# Patient Record
Sex: Female | Born: 1950 | Race: Black or African American | Hispanic: No | Marital: Married | Smoking: Former smoker
Health system: Southern US, Community
[De-identification: ages and names within clinical notes are randomized; demographics above are authoritative.]

## PROBLEM LIST (undated history)

## (undated) DIAGNOSIS — F329 Major depressive disorder, single episode, unspecified: Secondary | ICD-10-CM

## (undated) DIAGNOSIS — T4145XA Adverse effect of unspecified anesthetic, initial encounter: Secondary | ICD-10-CM

## (undated) DIAGNOSIS — Z8739 Personal history of other diseases of the musculoskeletal system and connective tissue: Secondary | ICD-10-CM

## (undated) DIAGNOSIS — I2510 Atherosclerotic heart disease of native coronary artery without angina pectoris: Secondary | ICD-10-CM

## (undated) DIAGNOSIS — R112 Nausea with vomiting, unspecified: Secondary | ICD-10-CM

## (undated) DIAGNOSIS — J45909 Unspecified asthma, uncomplicated: Secondary | ICD-10-CM

## (undated) DIAGNOSIS — T8859XA Other complications of anesthesia, initial encounter: Secondary | ICD-10-CM

## (undated) DIAGNOSIS — Z9889 Other specified postprocedural states: Secondary | ICD-10-CM

## (undated) DIAGNOSIS — F419 Anxiety disorder, unspecified: Secondary | ICD-10-CM

## (undated) DIAGNOSIS — F32A Depression, unspecified: Secondary | ICD-10-CM

## (undated) DIAGNOSIS — E785 Hyperlipidemia, unspecified: Secondary | ICD-10-CM

## (undated) DIAGNOSIS — K5900 Constipation, unspecified: Secondary | ICD-10-CM

## (undated) DIAGNOSIS — D649 Anemia, unspecified: Secondary | ICD-10-CM

## (undated) DIAGNOSIS — C50919 Malignant neoplasm of unspecified site of unspecified female breast: Secondary | ICD-10-CM

## (undated) DIAGNOSIS — K219 Gastro-esophageal reflux disease without esophagitis: Secondary | ICD-10-CM

## (undated) DIAGNOSIS — I10 Essential (primary) hypertension: Secondary | ICD-10-CM

## (undated) DIAGNOSIS — M1990 Unspecified osteoarthritis, unspecified site: Secondary | ICD-10-CM

## (undated) DIAGNOSIS — R413 Other amnesia: Secondary | ICD-10-CM

## (undated) DIAGNOSIS — I639 Cerebral infarction, unspecified: Secondary | ICD-10-CM

## (undated) HISTORY — DX: Hyperlipidemia, unspecified: E78.5

## (undated) HISTORY — DX: Malignant neoplasm of unspecified site of unspecified female breast: C50.919

## (undated) HISTORY — DX: Major depressive disorder, single episode, unspecified: F32.9

## (undated) HISTORY — PX: VAGINAL HYSTERECTOMY: SUR661

## (undated) HISTORY — DX: Personal history of other diseases of the musculoskeletal system and connective tissue: Z87.39

## (undated) HISTORY — PX: LUMBAR SPINE SURGERY: SHX701

## (undated) HISTORY — PX: MASTECTOMY: SHX3

## (undated) HISTORY — DX: Unspecified osteoarthritis, unspecified site: M19.90

## (undated) HISTORY — DX: Essential (primary) hypertension: I10

## (undated) HISTORY — PX: TUBAL LIGATION: SHX77

## (undated) HISTORY — DX: Other amnesia: R41.3

## (undated) HISTORY — DX: Anxiety disorder, unspecified: F41.9

## (undated) HISTORY — PX: CHOLECYSTECTOMY: SHX55

## (undated) HISTORY — PX: REDUCTION MAMMAPLASTY: SUR839

## (undated) HISTORY — PX: DILATION AND CURETTAGE OF UTERUS: SHX78

## (undated) HISTORY — DX: Depression, unspecified: F32.A

## (undated) HISTORY — DX: Atherosclerotic heart disease of native coronary artery without angina pectoris: I25.10

## (undated) HISTORY — PX: BREAST SURGERY: SHX581

---

## 2004-01-29 ENCOUNTER — Ambulatory Visit (HOSPITAL_COMMUNITY): Admission: RE | Admit: 2004-01-29 | Discharge: 2004-01-29 | Payer: Self-pay | Admitting: Internal Medicine

## 2004-01-29 HISTORY — PX: COLONOSCOPY: SHX174

## 2004-07-22 ENCOUNTER — Ambulatory Visit: Payer: Self-pay | Admitting: Family Medicine

## 2005-03-30 ENCOUNTER — Ambulatory Visit: Payer: Self-pay | Admitting: Family Medicine

## 2005-07-12 ENCOUNTER — Ambulatory Visit: Payer: Self-pay | Admitting: Family Medicine

## 2005-08-11 ENCOUNTER — Encounter (INDEPENDENT_AMBULATORY_CARE_PROVIDER_SITE_OTHER): Payer: Self-pay | Admitting: *Deleted

## 2005-08-11 ENCOUNTER — Encounter: Admission: RE | Admit: 2005-08-11 | Discharge: 2005-08-11 | Payer: Self-pay | Admitting: General Surgery

## 2005-08-11 ENCOUNTER — Ambulatory Visit (HOSPITAL_COMMUNITY): Admission: RE | Admit: 2005-08-11 | Discharge: 2005-08-11 | Payer: Self-pay | Admitting: General Surgery

## 2005-08-11 ENCOUNTER — Ambulatory Visit (HOSPITAL_BASED_OUTPATIENT_CLINIC_OR_DEPARTMENT_OTHER): Admission: RE | Admit: 2005-08-11 | Discharge: 2005-08-11 | Payer: Self-pay | Admitting: General Surgery

## 2006-07-11 ENCOUNTER — Ambulatory Visit: Payer: Self-pay | Admitting: Family Medicine

## 2006-07-14 ENCOUNTER — Ambulatory Visit: Payer: Self-pay | Admitting: Cardiology

## 2006-07-26 ENCOUNTER — Ambulatory Visit: Payer: Self-pay

## 2006-08-03 ENCOUNTER — Ambulatory Visit: Payer: Self-pay | Admitting: Cardiology

## 2006-09-01 ENCOUNTER — Other Ambulatory Visit: Admission: RE | Admit: 2006-09-01 | Discharge: 2006-09-01 | Payer: Self-pay | Admitting: Obstetrics & Gynecology

## 2006-09-12 ENCOUNTER — Encounter: Admission: RE | Admit: 2006-09-12 | Discharge: 2006-09-12 | Payer: Self-pay | Admitting: Obstetrics & Gynecology

## 2006-09-22 ENCOUNTER — Encounter: Admission: RE | Admit: 2006-09-22 | Discharge: 2006-09-22 | Payer: Self-pay | Admitting: Obstetrics & Gynecology

## 2006-12-19 ENCOUNTER — Ambulatory Visit: Payer: Self-pay | Admitting: Family Medicine

## 2007-11-14 ENCOUNTER — Encounter: Admission: RE | Admit: 2007-11-14 | Discharge: 2007-11-14 | Payer: Self-pay | Admitting: Family Medicine

## 2008-12-05 ENCOUNTER — Encounter: Admission: RE | Admit: 2008-12-05 | Discharge: 2008-12-05 | Payer: Self-pay | Admitting: *Deleted

## 2009-06-26 ENCOUNTER — Encounter: Admission: RE | Admit: 2009-06-26 | Discharge: 2009-06-26 | Payer: Self-pay | Admitting: Neurosurgery

## 2009-09-23 ENCOUNTER — Encounter: Admission: RE | Admit: 2009-09-23 | Discharge: 2009-12-22 | Payer: Self-pay | Admitting: *Deleted

## 2009-12-11 ENCOUNTER — Encounter: Admission: RE | Admit: 2009-12-11 | Discharge: 2009-12-11 | Payer: Self-pay | Admitting: Neurosurgery

## 2009-12-23 ENCOUNTER — Encounter: Admission: RE | Admit: 2009-12-23 | Discharge: 2009-12-23 | Payer: Self-pay | Admitting: *Deleted

## 2010-12-17 ENCOUNTER — Other Ambulatory Visit: Payer: Self-pay | Admitting: *Deleted

## 2010-12-17 DIAGNOSIS — Z1231 Encounter for screening mammogram for malignant neoplasm of breast: Secondary | ICD-10-CM

## 2011-01-07 ENCOUNTER — Ambulatory Visit: Payer: Self-pay

## 2011-01-07 NOTE — Assessment & Plan Note (Signed)
[facility]                              CARDIOLOGY OFFICE NOTE   Morgan, Santos                        MRN:          [id number]  DATE:07/14/2006                            DOB:          May 20, 1951    REASON FOR VISIT:  Abnormal electrocardiogram and palpitations.   HISTORY OF PRESENT ILLNESS:  [name] is a pleasant 60 year old woman with  a history of hypertension and recently documented hyperlipidemia with an LDL  cholesterol of 169.  She denies any active tobacco use and has no known  history of coronary artery disease or myocardial infarction.  She recently  had a routine physical and had an electrocardiogram obtained which was  significantly abnormal.  This study showed a left anterior fascicular block  with QRS widening of 144 msec, inferior and anterolateral T wave inversions,  and otherwise nonspecific ST-T wave changes.  There were no older tracings  available for comparison.  Symptomatically, [name] denies having any  exertional chest pain or specific dyspnea on exertion.  She does experience  coughing when she is supine and a feeling of discomfort in her chest,  somewhat vague in description.  She sometimes feels some palpitations when  lying down and generally a funny feeling.  She tells me that she has been  experiencing this intermittently for the last 3 years.   ALLERGIES:  THE PATIENT REPORTS A SHELLFISH ALLERGY.   PRESENT MEDICATIONS:  1. Aspirin 325 mg p.o. every day.  2. Lexapro 10 mg p.o. every day.   PAST MEDICAL HISTORY:  1. As outlined in the history of present illness.  2. She has a prior history of breast nodule removal, apparently benign.  3. She has had a cholecystectomy and a partial hysterectomy.   FAMILY HISTORY:  Significant for hyperlipidemia.  She states that her mother  has heart disease and had a heart attack in her 43s.  She has a brother  and sister with reported heart disease in their 56s and  60s.   REVIEW OF SYSTEMS:  As described in the history of present illness.  She has  had no syncope.  She does have mild lower extremity edema at times.  She  denies any claudication.  She has had some problems with anxiety and her  nerves but this is better on Lexapro.  She experiences arthritic  discomfort.  She also describes some occasional wheezing but has had no  problems with asthma that has been documented and uses no inhalers.   SOCIAL HISTORY:  The patient is married.  She works in Designer, fashion/clothing at [employer].  She has a prior tobacco history but quit in 1996.  Denies any alcohol use.   PHYSICAL EXAMINATION:  VITAL SIGNS:  Blood pressure is 141/93, heart rate is  63, weight is 223 pounds.  GENERAL:  This is an overweight woman in no acute distress.  HEENT:  Conjunctivae and lids are normal.  Pharynx is clear.  NECK:  Supple without elevated jugular venous pressure or loud bruits.  No  thyromegaly is noted.  LUNGS:  Generally clear,  somewhat diminished, breath sounds.  No active  wheezing.  CARDIAC:  Reveals a regular rate and rhythm with a split S2, somewhat  paradoxic.  A soft systolic murmur at the base.  No S3, gallop, or  pericardial rub.  ABDOMEN:  Soft.  No hepatomegaly.  Bowel sounds are present.  EXTREMITIES:  Exhibit no significant pitting edema.  Distal pulses are 2+.  SKIN:  No ulcer change are noted.  MUSCULOSKELETAL:  No kyphosis noted.  NEUROPSYCHIATRIC:  The patient is alert and oriented x3.  Affect is normal.   IMPRESSION/RECOMMENDATIONS:  1. Abnormal electrocardiogram as detailed above in a 60 year old woman      with hypertension presently not on medical therapy, recently diagnosed      hyperlipidemia with elevated LDL cholesterol of 169 and potentially      some family history of cardiovascular disease.  She is not reporting      any exertional dyspnea or chest pain but does have some cough and      funny feeling in her chest when supine, as well as palpitations.   We      discussed this today and we will plan an echocardiogram to assess for      structural cardiac abnormalities and exclude the possibility of      cardiomyopathy as well as proceed with an ischemic evaluation via an      adenosine Myoview.  I will then have her follow up in the office to      review the results and next step.  2. Further plans to follow.     Jonelle Sidle, MD  Electronically Signed    SGM/MedQ  DD: 07/14/2006  DT: 07/14/2006  Job #: [id number]   cc:   Delaney Meigs, M.D.

## 2011-01-07 NOTE — Op Note (Signed)
NAME:  Morgan Santos, Morgan Santos                           ACCOUNT NO.:  [id number]   MEDICAL RECORD NO.:  [id number]                   PATIENT TYPE:  AMB   LOCATION:  DAY                                  FACILITY:  [facility]   PHYSICIAN:  Lionel December, M.D.                 DATE OF BIRTH:  01-24-1951   DATE OF PROCEDURE:  01/29/2004  DATE OF DISCHARGE:                                 OPERATIVE REPORT   PROCEDURE:  Total colonoscopy with polypectomy.   INDICATIONS:  Morgan Santos is a 60 year old African American female who recently  had an episode of rectal discomfort followed by bleeding.  She had small to  moderate amount of blood plus some clots.  She is now doing fine.  She also  has history of constipation, has been on a high-fiber diet and Colace.  She  is undergoing diagnostic colonoscopy.  Family history is negative for  colorectal carcinoma.  Procedure and risks were reviewed with the patient  and informed consent was obtained.   PREOPERATIVE MEDICATIONS:  Demerol 50 mg IV, Versed 7 mg IV in divided  doses.   FINDINGS:  The procedure was performed in the endoscopy suite.  The  patient's vital signs and O2 saturation were monitored during the procedure  and remained stable.  The patient was placed in the left lateral recumbent  position.  Rectal examination was performed.  No abnormality noted on  external or digital exam.  Olympus video scope was placed in the rectum and  advanced under vision into the sigmoid colon and beyond.  Preparation was  satisfactory.  Some redundant colon.  The scope was passed to the cecum  which was identified by the ileocecal valve and appendiceal orifice.  Pictures taken for the record.  As the scope was withdrawn, colonic mucosa  was carefully examined.  There was a 7-8 mm polyp at the hepatic flexure  which was snared free for histologic examination, unfortunately broken into  pieces.  Polypectomy, however, was complete.  There was another small 3 mm  polyp at the  proximal sigmoid colon which was cold snared, but this was  lost.  The mucosa of the rest of the sigmoid colon and rectum was normal.  Scope was retroflexed.  We examined the anorectal junction.  Small  hemorrhoids were noted below the dentate line.  Endoscope was straightened  and withdrawn.  The patient tolerated the procedure well.   FINAL DIAGNOSIS:  1. A 7-8 mm polyp snared from the hepatic flexure.  Another 3 mm polyp was     cold snared from the sigmoid colon.  2. External hemorrhoids, possible source of recent rectal bleeding.   RECOMMENDATIONS:  Standard instructions given.  She will resume her high-  fiber diet, Fiber-Choice and Colace as before.  I will be contacting the  patient with biopsy results and further recommendations.      ___________________________________________  Lionel December, M.D.   NR/MEDQ  D:  01/29/2004  T:  01/30/2004  Job:  [id number]   cc:   Colon Flattery, D.O.  [address]  [place]  [place] [address]  Fax: [phone]

## 2011-01-07 NOTE — Consult Note (Signed)
NAME:  Morgan Santos, Morgan Santos NO.:  [id number]   MEDICAL RECORD NO.:  [id number]                  PATIENT TYPE:   LOCATION:                                       FACILITY:   PHYSICIAN:  Morgan Santos, M.D.                 DATE OF BIRTH:  12-01-1950   DATE OF CONSULTATION:  DATE OF DISCHARGE:                                   CONSULTATION   REFERRING PHYSICIAN:  Colon Santos, D.O.   REASON FOR CONSULTATION:  Rectal bleeding.   HISTORY OF PRESENT ILLNESS:  Morgan Santos is a 60 year old African-American  female who presents to our office.  She states several weeks ago she noticed  a burning sensation to her rectum.  She then went to the restroom and  noticed she had a small to moderate amount of rectal bleeding along with  some small clots.  She said this shortly resolved;  however, a few weeks  later she was mowing and she noticed this sensation again followed by rectal  bleeding.  Bleeding has never been associated with defecation.  She is also  complaining of rectal pruritis.  She denies any known history of  hemorrhoids.  She does report hard stools and constipation, and she does  not take her Maalox daily.  Currently, she has bowel movements on a daily  basis which are soft and brown.  She denies any history of diarrhea.  She  denies any melena.  She does have occasional reflux;  however, she denies  any chest pain, heartburn, indigestion, dysphagia, or odynophagia.   CURRENT MEDICATIONS:  1. Lexapro 5 mg daily.  2. Over-the-counter acid tabs p.r.n.  3. Maalox daily.   ALLERGIES:  CODEINE.   PAST MEDICAL HISTORY:  Depression.   PAST SURGICAL HISTORY:  1. Partial hysterectomy in 2003, by Morgan Santos, secondary to uterine     fibroids.  2. Cholecystectomy in 1983, secondary to cholelithiasis.   FAMILY HISTORY:  Positive for maternal aunt with Morgan cancer diagnosed in  her early 99s.  Both mother and father are deceased at age 92 and in his  74's,  respectively.  Family history is significant for CVA, oral carcinoma,  diabetes mellitus, and renal failure.  One sister deceased secondary to  renal failure.  She has four sisters alive and one brother alive.   SOCIAL HISTORY:  Morgan Santos has been married for nine years.  She has four  grown healthy children.  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  She denies  any tobacco, alcohol, or current drug use.  She does report a remote history  of experiencing with marijuana several years ago.   REVIEW OF SYSTEMS:  CONSTITUTIONAL:  Weight is steady, appetite is good.  Denies any fever or chills.  She is complaining of some early morning  fatigue;  however, this  subsides throughout the day.  CARDIOVASCULAR:  Denies any chest pain, palpitations.  PULMONARY:  Denies any shortness of  breath, cough, dyspnea, or hemoptysis.  HEMATOLOGIC:  Denies any history of  anemia except for with menorrhagia prior to hysterectomy.  Denies any  history of sickle cell disease or any other blood dyscrasias.  GASTROINTESTINAL:  See HPI.   PHYSICAL EXAMINATION:  VITAL SIGNS:  Weight 213.5 pounds, height 70.5  inches, temperature 97.9, blood pressure 120/80, pulse 66.  GENERAL:  Morgan Santos is a 60 year old obese African-American female who is  alert, pleasant, oriented, and cooperative.  In no acute distress.  HEENT:  Sclerae are clear, not icteric.  Conjunctivae pink.  Oropharynx pink  and moist without lesions.  NECK:  Supple without masses or thyromegaly.  CHEST:  Regular rate and rhythm with normal S1 and S2, no murmurs, rubs, or  gallops.  LUNGS:  Clear to auscultation bilaterally.  ABDOMEN:  She does have well-healed right upper quadrant scar secondary to  previous cholecystectomy, as well as small midline scar just below the  umbilicus well-healed.  No bruits auscultated.  Positive bowel sounds x4.  Abdomen is soft, nontender,  nondistended, with no palpable mass,  hepatosplenomegaly, no rebound tenderness or guarding.  RECTAL:  Reveals large protruding external hemorrhoids around the  circumference of the anal canal.  They are slightly erythematous, however,  not actively bleeding and very tender to palpation.  Good sphincter tone, a  small amount of light brown Hemoccult negative stool was obtained from the  vault.  No internal masses palpated.  EXTREMITIES:  Good pedal pulses bilaterally, no edema.   ASSESSMENT:  Morgan Santos is a 60 year old African-American female with  intermittent rectal bleeding, proctalgia, and rectal pruritis, consistent  with findings of external hemorrhoids.  She may also have developed a  fissure as well.  Given her history of hard stools, constipation, and  strenuous work, may be attributing to this.  Given her age and her positive  family history of colorectal carcinoma, I would recommend further evaluation  at this time to rule out polyps or colorectal carcinoma.   RECOMMENDATIONS:  1. Rx was given for Anusol suppositories one per rectum t.i.d. x14 days with     no refills.  2. Hemorrhoid literature on standard treatment was reviewed, including sitz     baths, increasing dietary fiber.  3. Recommend over-the-counter stool softener daily.  4. Recommend FiberChoice one to two tablets daily.  5. We will schedule Morgan Santos for colonoscopy by Morgan Santos in the near     future.  I have discussed the     procedure, including risks and benefits, which include, but are not     limited to bleeding, infection, perforation, __________.  Consent will be     obtained.   We would like to thank Morgan Santos for allowing [place] to participate in the care  of Morgan Santos.     ________________________________________  ___________________________________________  Nicholas Lose, N.P.                  Morgan Santos, M.D.  KC/MEDQ  D:  01/14/2004  T:  01/14/2004  Job:  [id number]   cc:   Morgan Santos, D.O.  [address]  [place]  [place] [address]  Fax: [phone]

## 2011-01-07 NOTE — Op Note (Signed)
Morgan Santos, Morgan Santos                 ACCOUNT NO.:  [id number]   MEDICAL RECORD NO.:  [id number]          PATIENT TYPE:  AMB   LOCATION:  DSC                          FACILITY:  [facility]   PHYSICIAN:  Gabrielle Dare. Janee Morn, M.D.DATE OF BIRTH:  [birth year]   DATE OF PROCEDURE:  08/11/2005  DATE OF DISCHARGE:                                 OPERATIVE REPORT   PREOPERATIVE DIAGNOSIS:  Suspicious left breast calcifications.   POSTOPERATIVE DIAGNOSIS:  Suspicious left breast calcifications.   PROCEDURE:  Needle localized left breast biopsy.   SURGEON:  Gabrielle Dare. Janee Morn, M.D.   ANESTHESIA:  General anesthesia.   HISTORY OF PRESENT ILLNESS:  Patient is a 60 year old African American  female who has a history of two breast biopsies in the past which were  benign.  Routine follow-up mammogram showed some suspicious calcifications  in the subareolar area on the left breast.  She presents today for needle  localized breast biopsy of this suspicious area.  She underwent needle  localization at the breast center today.   DESCRIPTION OF PROCEDURE:  Informed consent was obtained.  The patient was  identified and her site was marked.  She received intravenous antibiotics.  She was brought to the operating room. General anesthesia was administered.  Her left breast was prepped and draped in a sterile fashion.  A  superolateral periareolar incision was made in a curvilinear fashion along  the border.  Subcutaneous tissues were dissected down, continuing across to  where the needle entered into the breast tissue. The needle was delivered in  through the skin while not moving it within the parenchyma of the breast.  The breast tissue was then circumferentially dissected, following the path  of the needle and encompassing its trajectory and excised.  This was sent to  mammography.  In the interim, hemostasis was obtained with Bovie cautery in  the wound.  The wound was then copiously irrigated, 0.25% Marcaine  plain was  injected for local anesthetic.  After insuring hemostasis, the wound was  then closed in two layers with subcutaneous tissues being reapproximated  with interrupted 3-0 Vicryl sutures and the skin closed with running 4-0  Monocryl  subcuticular stitch.  We received a report that suspicious calcifications  were seen within the specimen on mammography and benzoin and Steri-Strips  and sterile dressing were placed on the wound.  Patient was taken to  recovery room in stable condition after tolerated the procedure without  apparent complications.      Gabrielle Dare Janee Morn, M.D.  Electronically Signed     BET/MEDQ  D:  08/11/2005  T:  08/13/2005  Job:  [id number]   cc:   Ruthy Dick  Fax: [phone]   Delaney Meigs, M.D.  Fax: [phone]

## 2011-01-07 NOTE — Assessment & Plan Note (Signed)
[facility]                            CARDIOLOGY OFFICE NOTE   KATIEJO, GILROY                        MRN:          [id number]  DATE:08/03/2006                            DOB:          December 12, 1950    PRIMARY CARE PHYSICIAN:  Dr. Joette Catching   REASON FOR VISIT:  Is follow up cardiac testing.   HISTORY OF PRESENT ILLNESS:  I saw Ms. Christenson back in late November.  She  was referred with an abnormal resting electrocardiogram as well as some  symptom of palpitations.  I referred her for an adenosine Myoview which  was interpreted by Dr. Myrtis Ser to be normal without any evidence of  ischemia and with a normal ejection fraction of 57%.  She did not follow  up for the echocardiogram.  I reviewed this study with the patient and  her husband today.  Overall, she symptomatically has had no major  recurrent palpitations and continues to deny any problems with chest  pain or dyspnea.  I spoke with her about risk factor modification and  strategies given her family history and her elevated lipids and I have  asked her to start a basic walking regimen.  She should otherwise  continue to follow up with Dr. Lysbeth Galas for further risk factor  modification.  She will continue on aspirin at this time as well.   ALLERGIES:  CODEINE   PRESENT MEDICATIONS:  1. Aspirin 81 mg p.o. daily.  2. Lexapro 5 mg p.o. daily.   REVIEW OF SYSTEMS:  As described in history of present illness.   EXAMINATION:  Blood pressure 137/72. Heart rate is 64.  Weight is 222  pounds.  Otherwise no change.   IMPRESSION/RECOMMENDATION:  1. Reassuring adenosine Myoview indicating  no ischemia with a normal      ejection fraction of 57%.  This is in a setting of an abnormal      resting electrocardiogram as outlined previously.  The patient does      have risk factors including family history, mild hypertension and      elevated low-density lipoprotein cholesterol of 169.  I spoke with  her about all of these issues today and would anticipate further      risk factor modification by Dr. Lysbeth Galas going forward.  No further      cardiac testing recommended at this particular time.  2. We can see her back as needed.    Jonelle Sidle, MD  Electronically Signed   SGM/MedQ  DD: 08/03/2006  DT: 08/03/2006  Job #: [id number]   cc:   Delaney Meigs, M.D.

## 2011-01-10 ENCOUNTER — Ambulatory Visit
Admission: RE | Admit: 2011-01-10 | Discharge: 2011-01-10 | Disposition: A | Discharge status: Home / Self Care | Payer: Self-pay | Source: Ambulatory Visit | Attending: *Deleted | Admitting: *Deleted

## 2011-01-10 DIAGNOSIS — Z1231 Encounter for screening mammogram for malignant neoplasm of breast: Secondary | ICD-10-CM

## 2012-02-01 ENCOUNTER — Other Ambulatory Visit: Payer: Self-pay | Admitting: *Deleted

## 2012-02-01 DIAGNOSIS — Z1231 Encounter for screening mammogram for malignant neoplasm of breast: Secondary | ICD-10-CM

## 2012-02-24 ENCOUNTER — Ambulatory Visit
Admission: RE | Admit: 2012-02-24 | Discharge: 2012-02-24 | Disposition: A | Discharge status: Home / Self Care | Payer: BC Managed Care – PPO | Source: Ambulatory Visit | Attending: *Deleted | Admitting: *Deleted

## 2012-02-24 ENCOUNTER — Other Ambulatory Visit: Payer: Self-pay | Admitting: *Deleted

## 2012-02-24 DIAGNOSIS — Z1231 Encounter for screening mammogram for malignant neoplasm of breast: Secondary | ICD-10-CM

## 2013-01-15 ENCOUNTER — Telehealth: Payer: Self-pay

## 2013-01-15 NOTE — Telephone Encounter (Signed)
Called pt in reference to her referral for colonoscopy. Her last one was 01/29/2004 by NUR. ( She thought it was 2003.) She is not having any problems but is getting ready to retire and thought about having one. She is going to check with her insurance. Also, I informed her that NUR is no longer at this office. He is local and I can give her that number if she prefers, or we will be glad to have one of our physicians do her and she can stay with this practice. She will think about it, call her insurance and let us know.

## 2013-01-22 NOTE — Telephone Encounter (Signed)
Letter to pt and PCP.  

## 2013-03-04 ENCOUNTER — Other Ambulatory Visit: Payer: Self-pay

## 2013-03-04 DIAGNOSIS — Z1231 Encounter for screening mammogram for malignant neoplasm of breast: Secondary | ICD-10-CM

## 2013-03-21 ENCOUNTER — Ambulatory Visit: Payer: BC Managed Care – PPO

## 2013-05-21 ENCOUNTER — Ambulatory Visit
Admission: RE | Admit: 2013-05-21 | Discharge: 2013-05-21 | Disposition: A | Discharge status: Home / Self Care | Payer: BC Managed Care – PPO | Source: Ambulatory Visit

## 2013-05-21 DIAGNOSIS — Z1231 Encounter for screening mammogram for malignant neoplasm of breast: Secondary | ICD-10-CM

## 2013-05-27 ENCOUNTER — Other Ambulatory Visit: Payer: Self-pay | Admitting: *Deleted

## 2013-05-27 DIAGNOSIS — R928 Other abnormal and inconclusive findings on diagnostic imaging of breast: Secondary | ICD-10-CM

## 2013-06-11 ENCOUNTER — Other Ambulatory Visit: Payer: Self-pay | Admitting: *Deleted

## 2013-06-11 ENCOUNTER — Ambulatory Visit
Admission: RE | Admit: 2013-06-11 | Discharge: 2013-06-11 | Disposition: A | Discharge status: Home / Self Care | Payer: BC Managed Care – PPO | Source: Ambulatory Visit | Attending: *Deleted | Admitting: *Deleted

## 2013-06-11 DIAGNOSIS — R928 Other abnormal and inconclusive findings on diagnostic imaging of breast: Secondary | ICD-10-CM

## 2013-06-11 DIAGNOSIS — N6310 Unspecified lump in the right breast, unspecified quadrant: Secondary | ICD-10-CM

## 2013-06-12 ENCOUNTER — Other Ambulatory Visit: Payer: Self-pay | Admitting: *Deleted

## 2013-06-12 DIAGNOSIS — D0511 Intraductal carcinoma in situ of right breast: Secondary | ICD-10-CM

## 2013-06-14 ENCOUNTER — Telehealth: Payer: Self-pay | Admitting: *Deleted

## 2013-06-14 DIAGNOSIS — C50411 Malignant neoplasm of upper-outer quadrant of right female breast: Secondary | ICD-10-CM

## 2013-06-14 NOTE — Telephone Encounter (Signed)
Confirmed [facility] for 06/19/13 at 0800.  Instructions and contact information given.  Pt denies traveling to [place] in the last 30 days. 

## 2013-06-18 ENCOUNTER — Ambulatory Visit
Admission: RE | Admit: 2013-06-18 | Discharge: 2013-06-18 | Disposition: A | Discharge status: Home / Self Care | Payer: BC Managed Care – PPO | Source: Ambulatory Visit | Attending: *Deleted | Admitting: *Deleted

## 2013-06-18 DIAGNOSIS — D0511 Intraductal carcinoma in situ of right breast: Secondary | ICD-10-CM

## 2013-06-18 MED ORDER — GADOBENATE DIMEGLUMINE 529 MG/ML IV SOLN
20.0000 mL | Freq: Once | INTRAVENOUS | Status: AC | PRN
  Administered 2013-06-18: 20 mL via INTRAVENOUS

## 2013-06-19 ENCOUNTER — Encounter: Payer: Self-pay | Admitting: Oncology

## 2013-06-19 ENCOUNTER — Ambulatory Visit: Payer: BC Managed Care – PPO | Attending: General Surgery | Admitting: Physical Therapy

## 2013-06-19 ENCOUNTER — Ambulatory Visit (HOSPITAL_BASED_OUTPATIENT_CLINIC_OR_DEPARTMENT_OTHER): Payer: BC Managed Care – PPO | Admitting: Oncology

## 2013-06-19 ENCOUNTER — Ambulatory Visit
Admission: RE | Admit: 2013-06-19 | Discharge: 2013-06-19 | Disposition: A | Discharge status: Home / Self Care | Payer: BC Managed Care – PPO | Source: Ambulatory Visit | Attending: Radiation Oncology | Admitting: Radiation Oncology

## 2013-06-19 ENCOUNTER — Other Ambulatory Visit (HOSPITAL_BASED_OUTPATIENT_CLINIC_OR_DEPARTMENT_OTHER): Payer: BC Managed Care – PPO | Admitting: Lab

## 2013-06-19 ENCOUNTER — Encounter: Payer: Self-pay | Admitting: *Deleted

## 2013-06-19 ENCOUNTER — Ambulatory Visit (HOSPITAL_BASED_OUTPATIENT_CLINIC_OR_DEPARTMENT_OTHER): Payer: BC Managed Care – PPO | Admitting: General Surgery

## 2013-06-19 ENCOUNTER — Encounter (INDEPENDENT_AMBULATORY_CARE_PROVIDER_SITE_OTHER): Payer: Self-pay | Admitting: General Surgery

## 2013-06-19 ENCOUNTER — Ambulatory Visit: Payer: BC Managed Care – PPO

## 2013-06-19 VITALS — BP 151/87 | HR 73 | Temp 97.8°F | Resp 19 | Ht 69.0 in | Wt 246.6 lb

## 2013-06-19 DIAGNOSIS — C50411 Malignant neoplasm of upper-outer quadrant of right female breast: Secondary | ICD-10-CM

## 2013-06-19 DIAGNOSIS — C50419 Malignant neoplasm of upper-outer quadrant of unspecified female breast: Secondary | ICD-10-CM

## 2013-06-19 DIAGNOSIS — C50919 Malignant neoplasm of unspecified site of unspecified female breast: Secondary | ICD-10-CM | POA: Insufficient documentation

## 2013-06-19 DIAGNOSIS — R293 Abnormal posture: Secondary | ICD-10-CM | POA: Insufficient documentation

## 2013-06-19 DIAGNOSIS — IMO0001 Reserved for inherently not codable concepts without codable children: Secondary | ICD-10-CM | POA: Insufficient documentation

## 2013-06-19 DIAGNOSIS — Z170 Estrogen receptor positive status [ER+]: Secondary | ICD-10-CM

## 2013-06-19 LAB — COMPREHENSIVE METABOLIC PANEL (CC13)
AST: 19 U/L (ref 5–34)
Anion Gap: 8 mEq/L (ref 3–11)
BUN: 11 mg/dL (ref 7.0–26.0)
CO2: 25 mEq/L (ref 22–29)
Calcium: 9.6 mg/dL (ref 8.4–10.4)
Chloride: 111 mEq/L — ABNORMAL HIGH (ref 98–109)
Creatinine: 0.8 mg/dL (ref 0.6–1.1)
Glucose: 162 mg/dl — ABNORMAL HIGH (ref 70–140)
Sodium: 145 mEq/L (ref 136–145)

## 2013-06-19 LAB — CBC WITH DIFFERENTIAL/PLATELET
Basophils Absolute: 0 10*3/uL (ref 0.0–0.1)
EOS%: 1.8 % (ref 0.0–7.0)
Eosinophils Absolute: 0.1 10*3/uL (ref 0.0–0.5)
HCT: 41.2 % (ref 34.8–46.6)
HGB: 13.4 g/dL (ref 11.6–15.9)
MCH: 29.1 pg (ref 25.1–34.0)
MCV: 89.6 fL (ref 79.5–101.0)
MONO#: 0.4 10*3/uL (ref 0.1–0.9)
MONO%: 6.5 % (ref 0.0–14.0)
NEUT%: 54.1 % (ref 38.4–76.8)
WBC: 5.4 10*3/uL (ref 3.9–10.3)
lymph#: 2 10*3/uL (ref 0.9–3.3)

## 2013-06-19 NOTE — Progress Notes (Signed)
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 female  who was found to have a breast mass on a screening mammogram. Ultrasound confirmed 2 areas the right breast. Ultrasound is 7 x 5 x 4 mm and 4 x 5 x 5 mm. These are 5 cm apart. MRI was performed which showed these 2 areas with postbiopsy change in between. The biopsy of both areas showed a grade 2 invasive cancer which was ER positive PR positive HER-2 negative with a low proliferation rate. She presents today for consideration of treatment options. She is accompanied by her husband and her minister and his wife. She has no family history of malignancy. She is GX P5 with her first live birth 14. She had menses at 13 her last period in 2003. She never used hormone replacement. She has had pain since her biopsy in the outer quadrant of the right breast. She has some bruising. She does have a history of multiple previous breast surgeries for benign lesions. She has no history of breast cancer. No family history of breast cancer.  PREVIOUS RADIATION THERAPY: No  PAST MEDICAL HISTORY:  has a past medical history of Breast cancer and Hypertension.    PAST SURGICAL HISTORY: Past Surgical History  Procedure Laterality Date  . Cholecystectomy    . Abdominal hysterectomy      FAMILY HISTORY: No family history on file.  SOCIAL HISTORY:  History  Substance Use Topics  . Smoking status: Not on file  . Smokeless tobacco: Not on file  . Alcohol Use: Not on file    ALLERGIES: Codeine and Multihance  MEDICATIONS:  Current Outpatient Prescriptions  Medication Sig Dispense Refill  . ALPRAZolam (XANAX) 0.5 MG tablet Take 0.5 mg by mouth  at bedtime as needed for sleep.      Marland Kitchen aspirin 81 MG tablet Take 81 mg by mouth daily.      . Cholecalciferol (VITAMIN D-3) 5000 UNITS TABS Take 2 each by mouth daily.      . Flaxseed, Linseed, (FLAX SEED OIL) 1000 MG CAPS Take 2 each by mouth daily.      Marland Kitchen omeprazole (PRILOSEC) 20 MG capsule Take 20 mg by mouth daily.      . risperiDONE (RISPERDAL) 0.25 MG tablet Take 0.25 mg by mouth at bedtime. 3 pills at bedtime      . simvastatin (ZOCOR) 20 MG tablet Take 20 mg by mouth every evening.       No current facility-administered medications for this encounter.    REVIEW OF SYSTEMS:  A 15 point review of systems is documented in the electronic medical record. This was obtained by the nursing staff. However, I reviewed this with the patient to discuss relevant findings and make appropriate changes.  Pertinent items are noted in HPI.  PHYSICAL EXAM: . He is a pleasant female who appears her stated age sitting comfortably examining table. She has ptotic breasts bilaterally. She has bruising and tenderness in the outer portion of the right breast. She has no palpable lymph nodes bilaterally. Nothing palpable in the left breast. She has several well-healed incisions on her bilateral breasts. She is alert and oriented x3. Performance status of 0.  LABORATORY DATA:  Lab Results  Component Value Date   WBC 5.4 06/19/2013   HGB 13.4 06/19/2013   HCT 41.2 06/19/2013   MCV 89.6 06/19/2013   PLT 195 06/19/2013   Lab Results  Component Value Date   NA 145 06/19/2013   K 3.8 06/19/2013   CO2 25 06/19/2013   Lab Results  Component Value Date   ALT 15 06/19/2013   AST 19 06/19/2013   ALKPHOS 102 06/19/2013   BILITOT 0.73 06/19/2013     RADIOGRAPHY: US Breast Right  06/14/2013   ADDENDUM REPORT: 06/14/2013 09:29  ADDENDUM: Correction to EXAM:  - DIGITAL DIAGNOSTIC UNILATERAL RIGHT MAMMOGRAM LIMITED  - RIGHT BREAST ULTRASOUND   Electronically Signed   By: Edwin Cap M.D.   On: 06/14/2013  09:29   06/14/2013   CLINICAL DATA:  Screening recall for a possible right breast mass.  EXAM: DIGITAL DIAGNOSTIC UNILATERAL RIGHT MAMMOGRAM LIMITED  COMPARISON:  Previous exams.  ACR Breast Density Category b: There are scattered areas of fibroglandular density.  FINDINGS: There is a mass in the upper outer right breast at the approximate 9 to 10 o'clock position with obscured margins measuring approximately 8-9 mm and confirmed on the additional CC and MLO spot compression views.  Physical examination of the upper-outer right breast does not reveal any palpable abnormalities, although the patient reports soreness at the approximate 9 o'clock position 3-4 cm from the nipple.  Targeted ultrasound of the right breast was performed.  1. There is a shadowing hypoechoic mass at 9 o'clock 4 cm from the nipple measuring 0.4 x 0.5 by 0.5 cm.  2. There is an additional hypoechoic mass with irregular margins in the right breast at 9 o'clock more lateral at approximately 7 cm from the nipple (although incorrectly labeled 4 cm from the nipple) measuring 0.7 x 0.5 x 0.4 cm. This likely corresponds with mammography findings.  No suspicious lymphadenopathy seen in the right axilla.  IMPRESSION: Two suspicious masses in the right breast at 9 o'clock, 1 at approximately 4 cm from the nipple and the other far lateral at approximately 7 cm from the nipple (incorrectly labeled 4 cm from the nipple), with the far lateral mass likely corresponding with mammography findings.  RECOMMENDATION: Ultrasound-guided biopsy of the two suspicious masses in the right breast is recommended. This will be performed subsequently this morning.  I have discussed the findings and recommendations with the patient. Results were also provided in writing at the conclusion of the visit. If applicable, a reminder letter will be sent to the patient regarding the next appointment.  BI-RADS CATEGORY  4: Suspicious abnormality - biopsy should be considered.   Electronically Signed: By: Edwin Cap M.D. 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 Preop evaluation.  EXAM: MR BILATERAL BREAST WITHOUT AND WITH CONTRAST  LABS:  BUN and creatinine were obtained on site at [facility] at  [address]  Results:  BUN 13 mg/dL,  Creatinine 0.8 mg/dL.  TECHNIQUE: Multiplanar, multisequence MR images of both breasts were obtained prior to and following the intravenous administration of 20ml of MultiHance.  THREE-DIMENSIONAL MR IMAGE RENDERING ON INDEPENDENT WORKSTATION:  Three-dimensional MR images were rendered by post-processing of the original MR data on an independent workstation. The three-dimensional MR images were interpreted, and findings are reported in the following complete MRI report for this  study.  COMPARISON:  Previous exams  FINDINGS: Breast composition: Insert density be  Background parenchymal enhancement: Mild to moderate  Right breast: There is an irregularly marginated enhancing mass located within the posterior 1/3 of the lateral right breast (upper outer quadrant) which corresponds to the more laterally located recently diagnosed invasive ductal carcinoma. This measures 1.3 x 1.2 x 1.1 cm in size. There is a signal void associated with this mass located along the lateral aspect of the mass related to the recent ultrasound-guided core biopsy. There is extensive post biopsy change surrounding this mass and extending anteriorly into the location of the more anteriorly located smaller mass. The more anteriorly located smaller mass is located 4 cm anterior and slightly medial to the larger mass and is associated with central clip artifact. The smaller mass blends with adjacent post biopsy change and is better sized by the ultrasound study. There is enhancement bridging these 2 masses which for  extends for 6 cm. Much of this enhancement is most likely due to post biopsy change. There are no additional areas of worrisome enhancement within the right breast.  Left breast: No mass or abnormal enhancement.  Lymph nodes: No abnormal appearing lymph nodes.  Ancillary findings:  There is a small right pleural effusion.  IMPRESSION: 1. 1.3 cm irregular enhancing mass located laterally within the upper outer quadrant of the right breast (posterior 1/3) corresponding to the recently diagnosed invasive ductal carcinoma.  2. The smaller more anteriorly located mass is located 4 cm anterior and medial to the larger mass. This mass blends with adjacent enhancement and post biopsy change and is better sized by ultrasound.  The area of enhancement including these 2 masses extends for 6 cm. However, much of this enhancement is likely due to post biopsy change.  3. Small right pleural effusion.  RECOMMENDATION: Treatment plan  BI-RADS CATEGORY  6: Known biopsy-proven malignancy - appropriate action should be taken.   Electronically Signed   By: Anselmo Pickler M.D.   On: 06/18/2013 11:44   Mm Digital Diagnostic Unilat R  06/11/2013   CLINICAL DATA:  Post biopsy of 2 suspicious masses in the right breast at 9 o'clock.  EXAM: DIGITAL DIAGNOSTIC UNILATERAL RIGHT MAMMOGRAM  COMPARISON:  Previous exams  FINDINGS: Mammographic images were obtained following ultrasound guided biopsy of 2 masses in the right breast at 9 o'clock, 1 being 4 cm from the nipple and the other far lateral at approximately 7 cm from the nipple.  A ribbon shaped biopsy marking clip is present in the targeted region of the mass at 9 o'clock 4 cm from the nipple (site 1) and a dumb bell shaped biopsy marking clip is present in the targeted region of the mass at 9 o'clock far lateral 7 cm from the nipple (site 2).  IMPRESSION: Biopsy marking clips appropriately positioned post ultrasound-guided biopsy of 2 right breast masses.  Final Assessment: Post  Procedure Mammograms for Marker Placement   Electronically Signed   By: Edwin Cap M.D.   On: 06/11/2013 11:48   Mm Digital Screening  05/21/2013   *RADIOLOGY REPORT*  Clinical Data: Screening.  DIGITAL SCREENING BILATERAL MAMMOGRAM WITH CAD  Comparison:  Previous exam(s).  FINDINGS:  ACR Breast Density Category b:  There are scattered areas of fibroglandular density.  In the right breast, a possible mass warrants further evaluation with spot compression views and possibly ultrasound.  In the left breast, there are no findings suspicious for malignancy.  Images were processed with CAD.  IMPRESSION: Further evaluation is suggested for possible mass in the right breast.  RECOMMENDATION: Diagnostic mammogram and possibly ultrasound of the right breast. (Code:[id number])  The patient will be contacted regarding the findings, and additional imaging will be scheduled.  BI-RADS CATEGORY 0:  Incomplete.  Need additional imaging evaluation and/or prior mammograms for comparison.   Original Report Authenticated By: Rolla Plate, M.D.   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 This is concordant with the imaging findings. The patient was contacted by telephone given the results of the biopsies. Breast MRI has been scheduled on 06/18/2013. The patient has been scheduled to be seen at the [facility] on 06/19/2013.   Electronically Signed   By: Baird Lyons M.D.   On: 06/12/2013 13:38   06/12/2013   CLINICAL DATA:  Two suspicious masses in the right breast at 9 o'clock, 4 cm from the nipple and far lateral 7 cm from the nipple.  EXAM: US BREAST BX W LOC DEV 1ST LESION IMG BX SPEC US GUIDE*R* ; US  BREAST BX W LOC DEV EA ADD LESION IMG BX SPEC US GUIDE*R*  COMPARISON:  Previous exams.  PROCEDURE: I met with the patient and we discussed the procedure of ultrasound-guided biopsy, including benefits and alternatives. We discussed the high likelihood of a successful procedure. We discussed the risks of the procedure including infection, bleeding, tissue injury, clip migration, and inadequate sampling. Informed written consent was given.  Site 1. Using sterile technique and 2% Lidocaine as local anesthetic, under direct ultrasound visualization, a 12 gauge vacuum-assisteddevice was used to perform biopsy of the shadowing mass at 9 o'clock 4 cm from the nipple using a lateral approach. At the conclusion of the procedure, a ribbon shaped tissue marker clip was deployed into the biopsy cavity. Follow-up 2-view mammogram was performed and dictated separately.  Site 2. Using sterile technique and 2% Lidocaine as local anesthetic, under direct ultrasound visualization, a 12 gauge vacuum-assisteddevice was used to perform biopsy of the far lateral irregular mass at 9 o'clock 7 cm from the nipple using a lateral approach. At the conclusion of the procedure, a dumb bell tissue marker clip was deployed into the biopsy cavity. Follow-up 2-view mammogram was performed and dictated separately.  The usual time-out protocol was performed immediately prior to the procedure.  IMPRESSION: 1. Ultrasound-guided biopsy of the suspicious mass in the right breast at 9 o'clock 4 cm from the nipple (site 1 with ribbon shaped biopsy marking clip). No apparent complications.  2. Ultrasound-guided biopsy of the suspicious mass in the far lateral right breast at 9 o'clock 7 cm from the nipple (site 2 with dumb bell shaped biopsy marking clip). No apparent complications.  Electronically Signed: By: Edwin Cap M.D. 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 This is concordant with the imaging findings.  The patient was contacted by telephone given the results of the biopsies. Breast MRI has been scheduled on 06/18/2013. The patient has been scheduled to be seen at the [facility] on 06/19/2013.   Electronically Signed   By: Baird Lyons M.D.   On: 06/12/2013 13:38   06/12/2013   CLINICAL DATA:  Two suspicious masses in the right breast at 9 o'clock, 4 cm from the nipple and far lateral 7 cm from the nipple.  EXAM: US BREAST BX W LOC DEV 1ST LESION IMG BX SPEC US GUIDE*R* ; US BREAST BX W LOC DEV EA ADD LESION IMG BX SPEC US GUIDE*R*  COMPARISON:  Previous exams.  PROCEDURE: I met with the patient and we discussed the procedure of ultrasound-guided biopsy, including benefits and alternatives. We discussed the high likelihood of a successful procedure. We discussed the risks of the procedure including infection, bleeding, tissue injury, clip migration, and inadequate sampling. Informed written consent was given.  Site 1. Using sterile technique and 2% Lidocaine as local anesthetic, under direct ultrasound visualization, a 12 gauge vacuum-assisteddevice was used to perform biopsy of the shadowing mass at 9 o'clock 4 cm from the nipple using a lateral approach. At the conclusion of the procedure, a ribbon shaped tissue marker clip was deployed into the biopsy cavity. Follow-up 2-view mammogram was performed and dictated separately.  Site 2. Using sterile technique and 2% Lidocaine as local anesthetic, under direct ultrasound visualization, a 12 gauge vacuum-assisteddevice was used to perform biopsy of the far lateral irregular mass at 9 o'clock 7 cm from the nipple using a lateral approach. At the  conclusion of the procedure, a dumb bell tissue marker clip was deployed into the biopsy cavity. Follow-up 2-view mammogram was performed and dictated separately.  The usual time-out protocol was performed immediately prior to the procedure.  IMPRESSION: 1. Ultrasound-guided biopsy of the suspicious mass in the right breast at 9 o'clock 4 cm from the nipple (site 1 with ribbon shaped biopsy marking clip). No apparent complications.  2. Ultrasound-guided biopsy of the suspicious mass in the far lateral right breast at 9 o'clock 7 cm from the nipple (site 2 with dumb bell shaped biopsy marking clip). No apparent complications.  Electronically Signed: By: Edwin Cap M.D. On: 06/11/2013 11:40      IMPRESSION: Multifocal [id number] Right breast cancer  PLAN: I spoke with Ms. Jansma and her family at length. We discussed that there is no survival advantage between breast conservation a mastectomy. We discussed with modern techniques the local control is equal. We discussed the process of radiation in the simulate and placement tattoos. We discussed the necessity of 4-6 weeks of treatment if she chooses breast conservation. We discussed the increase in local failures in patients who undergo lumpectomy alone. We also discussed that due to cosmetic reasons she may elect for mastectomy. Assess if she did have a mastectomy she would likely not need postmastectomy radiation. She could proceed with immediate reconstruction.  She will discuss her surgical options with Dr. Dwain Sarna. She will also see Dr. Welton Flakes for consideration of antiestrogen therapy. She will see physical therapy and member of our patient family support staff. Does live in the [facility] and may elect for radiation treatment in he then and if she proceeds with breast conservation. I be happy to facilitate that. I spent 40 minutes  face to face with the patient and more than 50% of that time was spent in counseling and/or coordination of care.     ------------------------------------------------  Thea Silversmith, MD

## 2013-06-19 NOTE — Progress Notes (Signed)
Morgan Santos [id number] [birth year] 62 y.o. 06/19/2013 10:59 AM  CC  Remus Loffler, PA-C [address] [place] [address] Dr. Emelia Loron Dr. Lurline Hare  REASON FOR CONSULTATION:  62 year old female with new diagnosis of right breast cancer, seen the [facility] for discussion of treatment options   STAGE:   Breast cancer of upper-outer quadrant of right female breast   Primary site: Breast (Right)   Staging method: AJCC 7th Edition   Clinical: Stage IA (T1c, N0, cM0)   Summary: Stage IA (T1c, N0, cM0)  REFERRING PHYSICIAN: Dr. Emelia Loron  HISTORY OF PRESENT ILLNESS:  Morgan Santos is a 62 y.o. female.  For hyperlipidemia gastroesophageal reflux disease and anxiety. Patient underwent a screening mammogram and was found to have a right breast mass laterally. An ultrasound showed a 7 mm lesion at the 9:00 position 7 cm from the nipple. She was also found to have a second medial lesion at the 9:00 position 4 cm from the nipple. She underwent a biopsy of both lesions. 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 The second lesion also revealed invasive ductal carcinoma again ER positive PR positive HER-2/neu negative with a proliferation marker Ki-67 11%. She had an MRI performed that revealed 1.3. cm postbiopsy changes. She also was noted to have area of concern from anterior to posterior enhancement. This area measured 6 cm. Her pathology and radiology were discussed at the multidisciplinary breast conference. She is now seen in the multidisciplinary breast clinic for discussion of treatment options. She was also seen by Dr. Emelia Loron and Dr. Lurline Hare. Patient does complain of some night sweats chills fatigue and back pain which is chronic and ongoing. She's noted ringing in the ears some dental problems she  does wear dentures she is noted a swelling at the end of the day but it does improve. She has some cough and shortness of breath when climbing stairs. She does have history of arthritis anxiety and depression and hot flashes. Remainder of the 14 point review of systems is negative and scanned separately into the electronic medical record.   Past Medical History: Past Medical History  Diagnosis Date  . Breast cancer   . Hypertension   . Anxiety   . Depression   . Arthritis     Past Surgical History: Past Surgical History  Procedure Laterality Date  . Cholecystectomy    . Abdominal hysterectomy      Family History: History reviewed. No pertinent family history.  Social History History  Substance Use Topics  . Smoking status: Former Games developer  . Smokeless tobacco: Not on file  . Alcohol Use: Yes    Allergies: Allergies  Allergen Reactions  . Codeine Nausea And Vomiting  . Multihance [Gadobenate] Nausea Only and Cough    PT HAD INCREASED MUCOUS PRODUCTION AND PHLEGMY COUGH LASTING 10 MINS AFTER INJECTION, PT SENT HOME WITH BENADRYL, PREV NAUSEA AFTER GAD    Current Medications: Current Outpatient Prescriptions  Medication Sig Dispense Refill  . ALPRAZolam (XANAX) 0.5 MG tablet Take 0.5 mg by mouth at bedtime as needed for sleep.      Marland Kitchen aspirin 81 MG tablet Take 81 mg by mouth daily.      . Cholecalciferol (VITAMIN D-3) 5000 UNITS TABS Take 2 each by mouth daily.      . Flaxseed, Linseed, (FLAX SEED  OIL) 1000 MG CAPS Take 2 each by mouth daily.      Marland Kitchen omeprazole (PRILOSEC) 20 MG capsule Take 20 mg by mouth daily.      . risperiDONE (RISPERDAL) 0.25 MG tablet Take 0.25 mg by mouth at bedtime. 3 pills at bedtime      . simvastatin (ZOCOR) 20 MG tablet Take 20 mg by mouth every evening.       No current facility-administered medications for this visit.    OB/GYN History: menarche at 50, postmenopausal, no HRT first live birth at 89 (G24)  Fertility Discussion: n/a Prior  History of Cancer: no  Health Maintenance:  Colonoscopy yes Bone Density 2013 Last PAP smear 2013  ECOG PERFORMANCE STATUS: 0 - Asymptomatic  Genetic Counseling/testing: no  REVIEW OF SYSTEMS:  14 point review of systems was obtained and is scanned separately in the EMR  PHYSICAL EXAMINATION: Blood pressure 151/87, pulse 73, temperature 97.8 F (36.6 C), temperature source Oral, resp. rate 19, height 5\' 9"  (1.753 m), weight 246 lb 9.6 oz (111.857 kg).  ZOX:WRUEA, healthy, no distress, well nourished and well developed SKIN: skin color, texture, turgor are normal HEAD: Normocephalic EYES: PERRLA, EOMI, Conjunctiva are pink and non-injected EARS: External ears normal OROPHARYNX:no exudate, no erythema and lips, buccal mucosa, and tongue normal  NECK: supple, no adenopathy, thyroid normal size, non-tender, without nodularity LYMPH:  no palpable lymphadenopathy, no hepatosplenomegaly BREAST:breasts appear normal, no suspicious masses, no skin or nipple changes or axillary nodes LUNGS: clear to auscultation  HEART: regular rate & rhythm and no murmurs ABDOMEN:abdomen soft, non-tender, normal bowel sounds and no masses or organomegaly BACK: No CVA tenderness EXTREMITIES:less then 2 second capillary refill, no edema, no clubbing, no cyanosis  NEURO: alert & oriented x 3 with fluent speech, no focal motor/sensory deficits, gait normal, reflexes normal and symmetric     STUDIES/RESULTS: US Breast Right  06/14/2013   ADDENDUM REPORT: 06/14/2013 09:29  ADDENDUM: Correction to EXAM:  - DIGITAL DIAGNOSTIC UNILATERAL RIGHT MAMMOGRAM LIMITED  - RIGHT BREAST ULTRASOUND   Electronically Signed   By: Edwin Cap M.D.   On: 06/14/2013 09:29   06/14/2013   CLINICAL DATA:  Screening recall for a possible right breast mass.  EXAM: DIGITAL DIAGNOSTIC UNILATERAL RIGHT MAMMOGRAM LIMITED  COMPARISON:  Previous exams.  ACR Breast Density Category b: There are scattered areas of fibroglandular  density.  FINDINGS: There is a mass in the upper outer right breast at the approximate 9 to 10 o'clock position with obscured margins measuring approximately 8-9 mm and confirmed on the additional CC and MLO spot compression views.  Physical examination of the upper-outer right breast does not reveal any palpable abnormalities, although the patient reports soreness at the approximate 9 o'clock position 3-4 cm from the nipple.  Targeted ultrasound of the right breast was performed.  1. There is a shadowing hypoechoic mass at 9 o'clock 4 cm from the nipple measuring 0.4 x 0.5 by 0.5 cm.  2. There is an additional hypoechoic mass with irregular margins in the right breast at 9 o'clock more lateral at approximately 7 cm from the nipple (although incorrectly labeled 4 cm from the nipple) measuring 0.7 x 0.5 x 0.4 cm. This likely corresponds with mammography findings.  No suspicious lymphadenopathy seen in the right axilla.  IMPRESSION: Two suspicious masses in the right breast at 9 o'clock, 1 at approximately 4 cm from the nipple and the other far lateral at approximately 7 cm from the nipple (incorrectly labeled 4  cm from the nipple), with the far lateral mass likely corresponding with mammography findings.  RECOMMENDATION: Ultrasound-guided biopsy of the two suspicious masses in the right breast is recommended. This will be performed subsequently this morning.  I have discussed the findings and recommendations with the patient. Results were also provided in writing at the conclusion of the visit. If applicable, a reminder letter will be sent to the patient regarding the next appointment.  BI-RADS CATEGORY  4: Suspicious abnormality - biopsy should be considered.  Electronically Signed: By: Edwin Cap M.D. 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 Preop evaluation.   EXAM: MR BILATERAL BREAST WITHOUT AND WITH CONTRAST  LABS:  BUN and creatinine were obtained on site at [facility] at  [address]  Results:  BUN 13 mg/dL,  Creatinine 0.8 mg/dL.  TECHNIQUE: Multiplanar, multisequence MR images of both breasts were obtained prior to and following the intravenous administration of 20ml of MultiHance.  THREE-DIMENSIONAL MR IMAGE RENDERING ON INDEPENDENT WORKSTATION:  Three-dimensional MR images were rendered by post-processing of the original MR data on an independent workstation. The three-dimensional MR images were interpreted, and findings are reported in the following complete MRI report for this study.  COMPARISON:  Previous exams  FINDINGS: Breast composition: Insert density be  Background parenchymal enhancement: Mild to moderate  Right breast: There is an irregularly marginated enhancing mass located within the posterior 1/3 of the lateral right breast (upper outer quadrant) which corresponds to the more laterally located recently diagnosed invasive ductal carcinoma. This measures 1.3 x 1.2 x 1.1 cm in size. There is a signal void associated with this mass located along the lateral aspect of the mass related to the recent ultrasound-guided core biopsy. There is extensive post biopsy change surrounding this mass and extending anteriorly into the location of the more anteriorly located smaller mass. The more anteriorly located smaller mass is located 4 cm anterior and slightly medial to the larger mass and is associated with central clip artifact. The smaller mass blends with adjacent post biopsy change and is better sized by the ultrasound study. There is enhancement bridging these 2 masses which for extends for 6 cm. Much of this enhancement is most likely due to post biopsy change. There are no additional areas of worrisome enhancement within the right breast.  Left breast: No mass or abnormal enhancement.  Lymph nodes: No abnormal appearing lymph nodes.   Ancillary findings:  There is a small right pleural effusion.  IMPRESSION: 1. 1.3 cm irregular enhancing mass located laterally within the upper outer quadrant of the right breast (posterior 1/3) corresponding to the recently diagnosed invasive ductal carcinoma.  2. The smaller more anteriorly located mass is located 4 cm anterior and medial to the larger mass. This mass blends with adjacent enhancement and post biopsy change and is better sized by ultrasound.  The area of enhancement including these 2 masses extends for 6 cm. However, much of this enhancement is likely due to post biopsy change.  3. Small right pleural effusion.  RECOMMENDATION: Treatment plan  BI-RADS CATEGORY  6: Known biopsy-proven malignancy - appropriate action should be taken.   Electronically Signed   By: Anselmo Pickler M.D.   On: 06/18/2013 11:44   Mm Digital Diag Ltd R  06/19/2013   : CLINICAL DATA:  Screening recall for a possible  right breast mass.  EXAM:  DIGITAL DIAGNOSTIC UNILATERAL RIGHT MAMMOGRAM LIMITED  COMPARISON:  Previous exams.  ACR Breast Density:  ACR Breast Density Category b: There are scattered areas of fibroglandular density.  FINDINGS:  There is a mass in the upper outer right breast at the approximate 9 to 10 o'clock position with obscured margins measuring approximately 8-9 mm and confirmed on the additional CC and MLO spot compression views.  Physical examination of the upper-outer right breast does not reveal any palpable abnormalities, although the patient reports soreness at the approximate 9 o'clock position 3-4 cm from the nipple.  Targeted ultrasound of the right breast was performed.  1. There is a shadowing hypoechoic mass at 9 o'clock 4 cm from the nipple measuring 0.4 x 0.5 by 0.5 cm.  2. There is an additional hypoechoic mass with irregular margins in the right breast at 9 o'clock more lateral at approximately 7 cm from the nipple (although incorrectly labeled 4 cm from the nipple) measuring 0.7 x 0.5  x 0.4 cm. This likely corresponds with mammography findings.  No suspicious lymphadenopathy seen in the right axilla.  IMPRESSION:  Two suspicious masses in the right breast at 9 o'clock, 1 at approximately 4 cm from the nipple and the other far lateral at approximately 7 cm from the nipple (incorrectly labeled 4 cm from the nipple), with the far lateral mass likely corresponding with mammography findings.  RECOMMENDATION:  Ultrasound-guided biopsy of the two suspicious masses in the right breast is recommended. This will be performed subsequently this morning.  I have discussed the findings and recommendations with the patient. Results were also provided in writing at the conclusion of the visit. If applicable, a reminder letter will be sent to the patient regarding the next appointment.  BI-RADS CATEGORY:  4: Suspicious abnormality - biopsy should be considered.  ADDENDUM:  Correction to EXAM:  - DIGITAL DIAGNOSTIC UNILATERAL RIGHT MAMMOGRAM LIMITED  - RIGHT BREAST ULTRASOUND   Electronically Signed   By: Edwin Cap M.D.   On: 06/19/2013 10:15   Mm Digital Diagnostic Unilat R  06/11/2013   CLINICAL DATA:  Post biopsy of 2 suspicious masses in the right breast at 9 o'clock.  EXAM: DIGITAL DIAGNOSTIC UNILATERAL RIGHT MAMMOGRAM  COMPARISON:  Previous exams  FINDINGS: Mammographic images were obtained following ultrasound guided biopsy of 2 masses in the right breast at 9 o'clock, 1 being 4 cm from the nipple and the other far lateral at approximately 7 cm from the nipple.  A ribbon shaped biopsy marking clip is present in the targeted region of the mass at 9 o'clock 4 cm from the nipple (site 1) and a dumb bell shaped biopsy marking clip is present in the targeted region of the mass at 9 o'clock far lateral 7 cm from the nipple (site 2).  IMPRESSION: Biopsy marking clips appropriately positioned post ultrasound-guided biopsy of 2 right breast masses.  Final Assessment: Post Procedure Mammograms for Marker  Placement   Electronically Signed   By: Edwin Cap M.D.   On: 06/11/2013 11:48   Mm Digital Screening  05/21/2013   *RADIOLOGY REPORT*  Clinical Data: Screening.  DIGITAL SCREENING BILATERAL MAMMOGRAM WITH CAD  Comparison:  Previous exam(s).  FINDINGS:  ACR Breast Density Category b:  There are scattered areas of fibroglandular density.  In the right breast, a possible mass warrants further evaluation with spot compression views and possibly ultrasound.  In the left breast, there are no findings suspicious for malignancy.  Images  were processed with CAD.  IMPRESSION: Further evaluation is suggested for possible mass in the right breast.  RECOMMENDATION: Diagnostic mammogram and possibly ultrasound of the right breast. (Code:[id number]-55M)  The patient will be contacted regarding the findings, and additional imaging will be scheduled.  BI-RADS CATEGORY 0:  Incomplete.  Need additional imaging evaluation and/or prior mammograms for comparison.   Original Report Authenticated By: Rolla Plate, M.D.   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 This is concordant with the imaging findings. The patient was contacted by telephone given the results of the biopsies. Breast MRI has been scheduled on 06/18/2013. The patient has been scheduled to be seen at the [facility] on 06/19/2013.   Electronically Signed   By: Baird Lyons M.D.   On: 06/12/2013 13:38   06/12/2013   CLINICAL DATA:  Two suspicious masses in the right breast at 9 o'clock, 4 cm from the nipple and far lateral 7 cm from the nipple.  EXAM: US BREAST BX W LOC DEV 1ST LESION IMG BX SPEC US GUIDE*R* ; US BREAST BX W LOC DEV EA ADD LESION  IMG BX SPEC US GUIDE*R*  COMPARISON:  Previous exams.  PROCEDURE: I met with the patient and we discussed the procedure of ultrasound-guided biopsy, including benefits and alternatives. We discussed the high likelihood of a successful procedure. We discussed the risks of the procedure including infection, bleeding, tissue injury, clip migration, and inadequate sampling. Informed written consent was given.  Site 1. Using sterile technique and 2% Lidocaine as local anesthetic, under direct ultrasound visualization, a 12 gauge vacuum-assisteddevice was used to perform biopsy of the shadowing mass at 9 o'clock 4 cm from the nipple using a lateral approach. At the conclusion of the procedure, a ribbon shaped tissue marker clip was deployed into the biopsy cavity. Follow-up 2-view mammogram was performed and dictated separately.  Site 2. Using sterile technique and 2% Lidocaine as local anesthetic, under direct ultrasound visualization, a 12 gauge vacuum-assisteddevice was used to perform biopsy of the far lateral irregular mass at 9 o'clock 7 cm from the nipple using a lateral approach. At the conclusion of the procedure, a dumb bell tissue marker clip was deployed into the biopsy cavity. Follow-up 2-view mammogram was performed and dictated separately.  The usual time-out protocol was performed immediately prior to the procedure.  IMPRESSION: 1. Ultrasound-guided biopsy of the suspicious mass in the right breast at 9 o'clock 4 cm from the nipple (site 1 with ribbon shaped biopsy marking clip). No apparent complications.  2. Ultrasound-guided biopsy of the suspicious mass in the far lateral right breast at 9 o'clock 7 cm from the nipple (site 2 with dumb bell shaped biopsy marking clip). No apparent complications.  Electronically Signed: By: Edwin Cap M.D. On: 06/11/2013 11:40   [place] Rt Breast Bx W Loc Dev Ea Add Lesion Img Bx Spec US Guide  06/12/2013   ADDENDUM REPORT: 06/12/2013 13:38  ADDENDUM: Invasive  ductal carcinoma and ductal carcinoma in situ was reported from the mass in the upper-outer quadrant of the right breast and invasive ductal carcinoma was reported from the mass in the lateral aspect of the right breast. This is  concordant with the imaging findings. The patient was contacted by telephone given the results of the biopsies. Breast MRI has been scheduled on 06/18/2013. The patient has been scheduled to be seen at the [facility] on 06/19/2013.   Electronically Signed   By: Baird Lyons M.D.   On: 06/12/2013 13:38   06/12/2013   CLINICAL DATA:  Two suspicious masses in the right breast at 9 o'clock, 4 cm from the nipple and far lateral 7 cm from the nipple.  EXAM: US BREAST BX W LOC DEV 1ST LESION IMG BX SPEC US GUIDE*R* ; US BREAST BX W LOC DEV EA ADD LESION IMG BX SPEC US GUIDE*R*  COMPARISON:  Previous exams.  PROCEDURE: I met with the patient and we discussed the procedure of ultrasound-guided biopsy, including benefits and alternatives. We discussed the high likelihood of a successful procedure. We discussed the risks of the procedure including infection, bleeding, tissue injury, clip migration, and inadequate sampling. Informed written consent was given.  Site 1. Using sterile technique and 2% Lidocaine as local anesthetic, under direct ultrasound visualization, a 12 gauge vacuum-assisteddevice was used to perform biopsy of the shadowing mass at 9 o'clock 4 cm from the nipple using a lateral approach. At the conclusion of the procedure, a ribbon shaped tissue marker clip was deployed into the biopsy cavity. Follow-up 2-view mammogram was performed and dictated separately.  Site 2. Using sterile technique and 2% Lidocaine as local anesthetic, under direct ultrasound visualization, a 12 gauge vacuum-assisteddevice was used to perform biopsy of the far lateral irregular mass at 9 o'clock 7 cm from the nipple using a lateral approach. At the conclusion of the procedure, a dumb bell  tissue marker clip was deployed into the biopsy cavity. Follow-up 2-view mammogram was performed and dictated separately.  The usual time-out protocol was performed immediately prior to the procedure.  IMPRESSION: 1. Ultrasound-guided biopsy of the suspicious mass in the right breast at 9 o'clock 4 cm from the nipple (site 1 with ribbon shaped biopsy marking clip). No apparent complications.  2. Ultrasound-guided biopsy of the suspicious mass in the far lateral right breast at 9 o'clock 7 cm from the nipple (site 2 with dumb bell shaped biopsy marking clip). No apparent complications.  Electronically Signed: By: Edwin Cap M.D. On: 06/11/2013 11:40     LABS:    Chemistry      Component Value Date/Time   NA 145 06/19/2013 0847   K 3.8 06/19/2013 0847   CO2 25 06/19/2013 0847   BUN 11.0 06/19/2013 0847   CREATININE 0.8 06/19/2013 0847      Component Value Date/Time   CALCIUM 9.6 06/19/2013 0847   ALKPHOS 102 06/19/2013 0847   AST 19 06/19/2013 0847   ALT 15 06/19/2013 0847   BILITOT 0.73 06/19/2013 0847      Lab Results  Component Value Date   WBC 5.4 06/19/2013   HGB 13.4 06/19/2013   HCT 41.2 06/19/2013   MCV 89.6 06/19/2013   PLT 195 06/19/2013   PATHOLOGY: ADDITIONAL INFORMATION: 1. PROGNOSTIC INDICATORS - ACIS (BLOCK 1A) Results: IMMUNOHISTOCHEMICAL AND MORPHOMETRIC ANALYSIS BY THE AUTOMATED CELLULAR IMAGING SYSTEM (ACIS) Estrogen Receptor: 99%, POSITIVE, STRONG STAINING INTENSITY Progesterone Receptor: 99%, POSITIVE, STRONG STAINING INTENSITY Proliferation Marker Ki67: 11% REFERENCE RANGE ESTROGEN RECEPTOR NEGATIVE <1% POSITIVE =>1% PROGESTERONE RECEPTOR NEGATIVE <1% POSITIVE =>1% All controls stained appropriately Jimmy Picket MD Pathologist, Electronic Signature ( Signed 06/14/2013) 1. CHROMOGENIC IN-SITU HYBRIDIZATION Results: HER-2/NEU BY CISH - NO AMPLIFICATION OF HER-2  DETECTED. RESULT RATIO OF HER2: CEP 17 SIGNALS 0.98 AVERAGE HER2 COPY NUMBER  PER CELL 1.05 REFERENCE RANGE 1 of 4 FINAL for YALDA, HERD ([id number]) ADDITIONAL INFORMATION:(continued) NEGATIVE HER2/Chr17 Ratio <2.0 and Average HER2 copy number <4.0 EQUIVOCAL HER2/Chr17 Ratio <2.0 and Average HER2 copy number 4.0 and <6.0 POSITIVE HER2/Chr17 Ratio >=2.0 and/or Average HER2 copy number >=6.0 Jimmy Picket MD Pathologist, Electronic Signature ( Signed 06/13/2013) 2. PROGNOSTIC INDICATORS - ACIS (BLOCK 2A) Results: IMMUNOHISTOCHEMICAL AND MORPHOMETRIC ANALYSIS BY THE AUTOMATED CELLULAR IMAGING SYSTEM (ACIS) Estrogen Receptor: 89%, POSITIVE ,MODERATE STAINING INTENSITY Progesterone Receptor: 8%, POSITIVE, STRONG STAINING INTENSITY Proliferation Marker Ki67: 11% REFERENCE RANGE ESTROGEN RECEPTOR NEGATIVE <1% POSITIVE =>1% PROGESTERONE RECEPTOR NEGATIVE <1% POSITIVE =>1% All controls stained appropriately Jimmy Picket MD Pathologist, Electronic Signature ( Signed 06/14/2013) 2. CHROMOGENIC IN-SITU HYBRIDIZATION Results: HER-2/NEU BY CISH - NO AMPLIFICATION OF HER-2 DETECTED. RESULT RATIO OF HER2: CEP 17 SIGNALS 1.31 AVERAGE HER2 COPY NUMBER PER CELL 2.75 REFERENCE RANGE NEGATIVE HER2/Chr17 Ratio <2.0 and Average HER2 copy number <4.0 EQUIVOCAL HER2/Chr17 Ratio <2.0 and Average HER2 copy number 4.0 and <6.0 POSITIVE HER2/Chr17 Ratio >=2.0 and/or Average HER2 copy number >=6.0 Jimmy Picket MD Pathologist, Electronic Signature ( Signed 06/13/2013) 2 of 4 FINAL for MONTEEN, TOOPS [phone]) FINAL DIAGNOSIS Diagnosis 1. Breast, right, needle core biopsy, upper outer - INVASIVE DUCTAL CARCINOMA, SEE COMMENT. 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 2. Breast, right, needle core biopsy, far lateral - INVASIVE DUCTAL CARCINOMA, SEE COMMENT. Microscopic Comment 1. Although definitive grading of breast carcinoma is best done on excision, the features of the invasive tumor from the right upper outer biopsy are compatible with a grade  II breast carcinoma. In addition to the findings above there are also fibrocytic changes with associated calcification present. Breast prognostic markers will be performed and reported in an addendum. Findings are called to [facility] on 06/12/2013. Dr. Colonel Bald has seen this case in consultation with agreement. 2. Although definitive grading of breast carcinoma is best done on excision, the features of the invasive tumor from the right far lateral biopsy are compatible with a grade II breast carcinoma. Breast prognostic markers will be performed and reported in an addendum. Findings are called to [facility] on 06/12/2013. Dr. Colonel Bald has seen this case in consultation with agreement. (RH:kh 06-12-13) Zandra Abts MD Pathologist, Electronic Signature (Case signed 06/12/2013)  ASSESSMENT    62 year old female with  #1 new diagnosis of invasive ductal carcinoma with ductal carcinoma in situ with calcifications. On her mammogram and ultrasound she was found to have 2 lesions one measuring 7 mm and the second measuring 4 mm. She is also noted on MRI to have an area of enhancement at approximately measures 6 cm. This is concerning for possible malignancy. Her tumor was ER positive PR positive HER-2/neu negative with a favorable proliferation marker Ki-67 11%.  #2 patient and I discussed in detail her treatment options. We discussed her pathology radiology. We discussed the pathophysiology of breast cancer and the options available to her. She was seen by Dr. Emelia Loron for discussion of surgery. G2 extent of disease it was recommended that patient proceed with the possibility of a mastectomy. If she does get mastectomy she will need to be seen by a plastics for discussion of reconstruction.  #3 I discussed role of antiestrogen therapy since her tumor is ER positive. We discussed the different treatments available. Certainly the role of chemotherapy whether  or not she  needs it will be discussed upon her final pathology. Patient understands this.  Clinical Trial Eligibility: no Multidisciplinary conference discussion yes     PLAN:    #1 patient will proceed with plastics consultation.  #2 she will be seen by Dr. Dwain Sarna to determine what exactly her surgery will be scheduled.  #3 I will see the patient back after her surgery to discuss role of antiestrogen therapy as well as whether or not she will need chemotherapy depending on her final pathology results.       Discussion: Patient is being treated per NCCN breast cancer care guidelines appropriate for stage.I   Thank you so much for allowing me to participate in the care of JAMESE TRAUGER. I will continue to follow up the patient with you and assist in her care.  All questions were answered. The patient knows to call the clinic with any problems, questions or concerns. We can certainly see the patient much sooner if necessary.  I spent 40 minutes counseling the patient face to face. The total time spent in the appointment was 55 minutes.  Drue Second, MD Medical/Oncology [facility] [phone] (beeper) [phone] (Office)  06/19/2013, 11:00 AM

## 2013-06-19 NOTE — Progress Notes (Signed)
Checked in new pt with no financial concerns. °

## 2013-06-19 NOTE — Progress Notes (Addendum)
Patient ID: Morgan Santos, female   DOB: 02-21-1951, 62 y.o.   MRN: [id number]  Chief Complaint  Patient presents with  . Other    HPI Morgan Santos is a 62 y.o. female.  Referred by  [name] HPI 85 yof who underwent screening mm and was found to have a right breast mass.  She underwent u/s that showed two masses in 9 o'clock position of right breast.  One of these was 7 cm from nipple and measured 7x5x4 mm and the other was 4 cm from the nipple measuring 4x5x5 mm in size.  MRI shows that there is 1.3 cm mass laterally within the upper outer quadrant of the right breast and a smaller more anteriorly noted mass 4 cm away.  There is an area of enhancement that includes these masses measuring 6 cm.  Much of this may be from post biopsy change but cannot rule out cancer.  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  The second is IDC, grade II, er pos is 89%, pr pos at 8%, her 2 not amplfied, Ki is 11%. She comes in today without complaints and to discuss options. 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  Past Medical History  Diagnosis Date  . Breast cancer   . Hypertension   . Anxiety   . Depression   . Arthritis     Past Surgical History  Procedure Laterality Date  . Cholecystectomy    . Abdominal hysterectomy      History reviewed. No pertinent family history.  Social History History  Substance Use Topics  . Smoking status: Former Games developer  . Smokeless tobacco: Not on file  . Alcohol Use: Yes    Allergies  Allergen Reactions  . Codeine Nausea And Vomiting  . Multihance [Gadobenate] Nausea Only and Cough    PT HAD INCREASED MUCOUS PRODUCTION AND PHLEGMY COUGH LASTING 10 MINS AFTER INJECTION, PT SENT HOME WITH BENADRYL, PREV NAUSEA AFTER GAD    Current Outpatient Prescriptions  Medication Sig Dispense Refill  . ALPRAZolam (XANAX) 0.5 MG tablet Take 0.5 mg by  mouth at bedtime as needed for sleep.      Marland Kitchen aspirin 81 MG tablet Take 81 mg by mouth daily.      . Cholecalciferol (VITAMIN D-3) 5000 UNITS TABS Take 2 each by mouth daily.      . Flaxseed, Linseed, (FLAX SEED OIL) 1000 MG CAPS Take 2 each by mouth daily.      Marland Kitchen omeprazole (PRILOSEC) 20 MG capsule Take 20 mg by mouth daily.      . risperiDONE (RISPERDAL) 0.25 MG tablet Take 0.25 mg by mouth at bedtime. 3 pills at bedtime      . simvastatin (ZOCOR) 20 MG tablet Take 20 mg by mouth every evening.       No current facility-administered medications for this visit.    Review of Systems Review of Systems  Constitutional: Negative for fever, chills and unexpected weight change.  HENT: Negative for congestion, hearing loss, sore throat, trouble swallowing and voice change.   Eyes: Negative for visual disturbance.  Respiratory: Positive for cough and shortness of breath. Negative for wheezing.   Cardiovascular: Negative for chest pain, palpitations and leg swelling.  Gastrointestinal: Negative for nausea, vomiting, abdominal pain, diarrhea, constipation, blood in stool, abdominal distention and anal bleeding.  Genitourinary: Negative for hematuria, vaginal bleeding  and difficulty urinating.  Musculoskeletal: Positive for arthralgias and back pain.  Skin: Negative for rash and wound.  Neurological: Positive for headaches. Negative for seizures and syncope.  Hematological: Negative for adenopathy. Does not bruise/bleed easily.  Psychiatric/Behavioral: Negative for confusion.    There were no vitals taken for this visit.  Physical Exam Physical Exam  Vitals reviewed. Constitutional: She is oriented to person, place, and time. She appears well-developed and well-nourished.  Cardiovascular: Normal rate, regular rhythm and normal heart sounds.   Pulmonary/Chest: Effort normal and breath sounds normal. She has no wheezes. She has no rales. Right breast exhibits no inverted nipple, no mass, no  nipple discharge, no skin change and no tenderness. Left breast exhibits no inverted nipple, no mass, no nipple discharge, no skin change and no tenderness.    Lymphadenopathy:    She has no cervical adenopathy.    She has no axillary adenopathy.       Right: No supraclavicular adenopathy present.       Left: No supraclavicular adenopathy present.  Neurological: She is alert and oriented to person, place, and time.    Data Reviewed THREE-DIMENSIONAL MR IMAGE RENDERING ON INDEPENDENT WORKSTATION:  Three-dimensional MR images were rendered by post-processing of the  original MR data on an independent workstation. The  three-dimensional MR images were interpreted, and findings are  reported in the following complete MRI report for this study.  COMPARISON: Previous exams  FINDINGS:  Breast composition: Insert density be  Background parenchymal enhancement: Mild to moderate  Right breast: There is an irregularly marginated enhancing mass  located within the posterior 1/3 of the lateral right breast (upper  outer quadrant) which corresponds to the more laterally located  recently diagnosed invasive ductal carcinoma. This measures 1.3 x  1.2 x 1.1 cm in size. There is a signal void associated with this  mass located along the lateral aspect of the mass related to the  recent ultrasound-guided core biopsy. There is extensive post biopsy  change surrounding this mass and extending anteriorly into the  location of the more anteriorly located smaller mass. The more  anteriorly located smaller mass is located 4 cm anterior and  slightly medial to the larger mass and is associated with central  clip artifact. The smaller mass blends with adjacent post biopsy  change and is better sized by the ultrasound study. There is  enhancement bridging these 2 masses which for extends for 6 cm. Much  of this enhancement is most likely due to post biopsy change. There  are no additional areas of worrisome  enhancement within the right  breast.  Left breast: No mass or abnormal enhancement.  Lymph nodes: No abnormal appearing lymph nodes.  Ancillary findings: There is a small right pleural effusion.  IMPRESSION:  1. 1.3 cm irregular enhancing mass located laterally within the  upper outer quadrant of the right breast (posterior 1/3)  corresponding to the recently diagnosed invasive ductal carcinoma.  2. The smaller more anteriorly located mass is located 4 cm anterior  and medial to the larger mass. This mass blends with adjacent  enhancement and post biopsy change and is better sized by  ultrasound.  The area of enhancement including these 2 masses extends for 6 cm.  However, much of this enhancement is likely due to post biopsy  change.  3. Small right pleural effusion.   Assessment   Clinical stage II right breast cancer    Plan    Right breast mastectomy, right axillary  sentinel node biopsy is recommendation    We had a long discussion today and reviewed her mr images as well as mm/us.  There are two separate appearing cancers in her lower outer right breast by [place] but on mr these may be contiguous (or this may be postbiopsy change).  I think if we were sure these were separate we could consider enrollment in the multiple ipsilateral breast tumor study but I am not sure (nor is radiology) that this is the case.  I think her breast to tumor ratio and the location favors a mastectomy.  I recommended this to them today as the best treatment for her cancer.  We discussed a nipple sparing mastectomy as a possibility as well and I will have her see plastic surgery in consultation for immediate reconstruction.  We did discuss a lumpectomy and she is going to consider her options right now.  I will plan on seeing her back after plastic surgery appt. We discussed the staging and pathophysiology of breast cancer. We discussed all of the different options for treatment for breast cancer including  surgery, chemotherapy, radiation therapy, Herceptin, and antiestrogen therapy.   We discussed a sentinel lymph node biopsy as she does not appear to having lymph node involvement right now. We discussed the performance of that with injection of radioactive tracer and possibly blue dye.  We discussed about a 1-2% risk lifetime of chronic shoulder pain as well as lymphedema associated with a sentinel lymph node biopsy.  We discussed the options for treatment of the breast cancer which included lumpectomy versus a mastectomy. We discussed the performance of the lumpectomy with a wire placement. We discussed a chance of a positive margin requiring reexcision in the operating room. We also discussed that she may need radiation therapy or antiestrogen therapy or both if she undergoes lumpectomy. We discussed the mastectomy and the postoperative care for that as well. We discussed that there is no difference in her survival whether she undergoes lumpectomy with radiation therapy or antiestrogen therapy versus a mastectomy. We discussed the risks of operation including bleeding, infection, possible reoperation. She understands her further therapy will be based on what her stages at the time of her operation.    Osei Anger 06/19/2013, 12:15 PM

## 2013-06-24 ENCOUNTER — Telehealth: Payer: Self-pay | Admitting: *Deleted

## 2013-06-24 ENCOUNTER — Encounter: Payer: Self-pay | Admitting: *Deleted

## 2013-06-24 NOTE — Telephone Encounter (Signed)
Spoke to pt concerning [facility] from 10/29.  Pt denies questions or concerns regarding dx.  Confirmed appt with Dr. Leta Baptist on 06/25/13.  Pt is considering bilateral mastectomies without reconstruction at this time.  Informed pt to call me after she as seen Dr. Leta Baptist and thought about her options.  Received verbal understanding.  Contact information given.

## 2013-06-24 NOTE — Progress Notes (Signed)
[facility] Psychosocial Distress Screening Clinical Social Work  Patient completed distress screening protocol, and scored a 9 on the Psychosocial Distress Thermometer which indicates severe distress. Clinical Social Worker met with pt in [facility] to assess for distress and other psychosocial needs.  Pt stated she her distress level had decreased after meeting with the physicians and getting more information on her treatment plan.  CSW informed pt of the support team and support services at [facility], and pt was agreeable to an [employer] referral.  CSW encouraged pt to call with any questions or concerns.  Tamala Julian, MSW, LCSW Clinical Social Worker [facility] [phone]

## 2013-06-25 ENCOUNTER — Telehealth (INDEPENDENT_AMBULATORY_CARE_PROVIDER_SITE_OTHER): Payer: Self-pay

## 2013-06-25 NOTE — Telephone Encounter (Signed)
Called pt again b/c I never heard back from her after this a.m. I gave the pt her appt info for tomorrow at 10am to see Dr Helen Hashimoto and then to see Dr Dwain Sarna on Friday at 8am. I gave the pt both addresses for the appt's. The pt said she understands but at the same time said she was so confused about the appt's. I repeated the info back to her again before getting off the phone.

## 2013-06-25 NOTE — Telephone Encounter (Signed)
LMOM for pt to call me back b/c I was just speaking with her about her appt's for this week. The pt was confused on her appt's and she was going to call a friend to tell them she got the appt wrong for today. I need to speak to the pt to give her the appt's for this week.

## 2013-06-28 ENCOUNTER — Ambulatory Visit (INDEPENDENT_AMBULATORY_CARE_PROVIDER_SITE_OTHER): Payer: BC Managed Care – PPO | Admitting: General Surgery

## 2013-06-28 ENCOUNTER — Encounter (INDEPENDENT_AMBULATORY_CARE_PROVIDER_SITE_OTHER): Payer: Self-pay | Admitting: General Surgery

## 2013-06-28 VITALS — BP 140/80 | HR 60 | Temp 99.2°F | Resp 14 | Ht 69.5 in | Wt 246.0 lb

## 2013-06-28 DIAGNOSIS — C50419 Malignant neoplasm of upper-outer quadrant of unspecified female breast: Secondary | ICD-10-CM

## 2013-06-28 DIAGNOSIS — C50411 Malignant neoplasm of upper-outer quadrant of right female breast: Secondary | ICD-10-CM

## 2013-06-28 NOTE — Progress Notes (Signed)
Subjective:     Patient ID: Morgan Santos, female   DOB: 06/24/1951, 62 y.o.   MRN: [id number]  HPI This is a 62-year-old female I saw last week in our multidisciplinary conference. We discussed proceeding with a right mastectomy and a right sentinel lymph node biopsy. She was agreeable to that and she has recently been seen by plastic surgery. After a conversation with them she would like to proceed with right mastectomy and immediate breast reconstruction with implant-based reconstruction. She has had no changes since our last visit and she comes in today to discuss surgery.  Review of Systems     Objective:   Physical Exam Deferred today    Assessment:     Clinical stage II right breast cancer     Plan:     Right total mastectomy, right axillary sentinel lobe biopsy combined with immediate reconstruction  We discussed the possibilities included a nipple sparing mastectomy. I think her tumor is reasonable for that but Dr. Thimmappa she agrees that this is probably not the best plan and I think that is also correct. We will proceed with a right total mastectomy including the nipple and the areola combined with a right axillary sentinel lymph node biopsy as well as immediate reconstruction. We discussed the risks of surgery again today. She does not want to receive a blood transfusion even if this was life saving. We'll plan on doing a sometime over the next couple weeks in combination with plastic surgery.       

## 2013-07-01 ENCOUNTER — Encounter (HOSPITAL_COMMUNITY): Payer: Self-pay | Admitting: Pharmacy Technician

## 2013-07-08 ENCOUNTER — Encounter: Payer: Self-pay | Admitting: *Deleted

## 2013-07-08 NOTE — Progress Notes (Signed)
Mailed after appt letter to pt. 

## 2013-07-08 NOTE — Pre-Procedure Instructions (Signed)
CARREEN MILIUS  07/08/2013   Your procedure is scheduled on:  Fri, Nov 21 @ 11:00 AM  Report to [facility] Stay [facility]trance A  at 9:00 AM.  Call this number if you have problems the morning of surgery: [phone]   Remember:   Do not eat food or drink liquids after midnight.   Take these medicines the morning of surgery with A SIP OF WATER: Alprazolam(Xanax)               Stop taking your Aspirin and Flax Seed Oil. No Goody's,BC's,Aleve,Ibuprofen,Fish Oil,or any Herbal MEdications   Do not wear jewelry, make-up or nail polish.  Do not wear lotions, powders, or perfumes. You may wear deodorant.  Do not shave 48 hours prior to surgery.   Do not bring valuables to the hospital.  [facility] is not responsible                  for any belongings or valuables.               Contacts, dentures or bridgework may not be worn into surgery.  Leave suitcase in the car. After surgery it may be brought to your room.  For patients admitted to the hospital, discharge time is determined by your                treatment team.               Patients discharged the day of surgery will not be allowed to drive  home.    Special Instructions: Shower using CHG 2 nights before surgery and the night before surgery.  If you shower the day of surgery use CHG.  Use special wash - you have one bottle of CHG for all showers.  You should use approximately 1/3 of the bottle for each shower.   Please read over the following fact sheets that you were given: Pain Booklet, Coughing and Deep Breathing and Surgical Site Infection Prevention

## 2013-07-09 ENCOUNTER — Encounter (HOSPITAL_COMMUNITY)
Admission: RE | Admit: 2013-07-09 | Discharge: 2013-07-09 | Disposition: A | Discharge status: Home / Self Care | Payer: BC Managed Care – PPO | Source: Ambulatory Visit | Attending: General Surgery | Admitting: General Surgery

## 2013-07-09 ENCOUNTER — Encounter (HOSPITAL_COMMUNITY): Payer: Self-pay

## 2013-07-09 ENCOUNTER — Encounter: Payer: Self-pay | Admitting: *Deleted

## 2013-07-09 LAB — BASIC METABOLIC PANEL
CO2: 26 mEq/L (ref 19–32)
Calcium: 9.6 mg/dL (ref 8.4–10.5)
Chloride: 105 mEq/L (ref 96–112)
GFR calc Af Amer: 87 mL/min — ABNORMAL LOW (ref >90)
Glucose, Bld: 103 mg/dL — ABNORMAL HIGH (ref 70–99)
Potassium: 4 mEq/L (ref 3.5–5.1)
Sodium: 141 mEq/L (ref 135–145)

## 2013-07-09 LAB — CBC
HCT: 41.7 % (ref 36.0–46.0)
Hemoglobin: 14.2 g/dL (ref 12.0–15.0)
MCHC: 34.1 g/dL (ref 30.0–36.0)
MCV: 88.3 fL (ref 78.0–100.0)
RBC: 4.72 MIL/uL (ref 3.87–5.11)
RDW: 13 % (ref 11.5–15.5)
WBC: 6.2 10*3/uL (ref 4.0–10.5)

## 2013-07-09 NOTE — Progress Notes (Signed)
Pt. Reports that she was seen for chest pain at [facility]. 5 yrs. Ago, told that everything was ok, that it was related to stress. Pt. Reports she had a stress test at that time, it was wnl.  She denies chest problems / concerns now. She states that her BP goes up when she gets upset, but otherwise is normotensive.

## 2013-07-10 ENCOUNTER — Telehealth: Payer: Self-pay | Admitting: Oncology

## 2013-07-10 NOTE — Telephone Encounter (Signed)
per 11/18 Marianne Sofia POF appt made LVMM for pt and CAL mailed shh

## 2013-07-10 NOTE — H&P (Signed)
  Subjective:     Patient ID: Morgan Santos is a 62 y.o. female.  HPI  Pt of Dr. Dwain Sarna with plan for R mastectomy and immediate reconstruction with TE, Flex HD. Pt presented with R breast mass on screening MMG. US showed two masses in 9 o'clock position of right breast. One of these was 7 cm from nipple and measured 7x5x4 mm and the other was 4 cm from the nipple measuring 4x5x5 mm in size. MRI shows that there is 1.3 cm mass laterally within the upper outer quadrant of the right breast and a smaller more anteriorly noted mass 4 cm away. Enhancement noted spanning both  masses measuring 6 cm, post biopsy change vs cannot r/o cancer. Biopsy showed both lesion invasive ductal carcinoma/dcis, grade II, Er/PR +, Her 2-neu negative. Mastectomy planned given area involved.   Reports appr 12 lb wt gain over last year. Notes her mother in law had breast cancer and patient's husband feels his mother regretted not having reconstruction.  Reports left breast excision of mass, no radiation, several years in past. R breast has hx of abscess requiring surgery.   Current appr D cup. Feels L breast > right   Review of Systems + Jehovahs Witness     Objective:   Physical Exam  Constitutional: She is oriented to person, place, and time.  Cardiovascular: Normal rate and regular rhythm.   Pulmonary/Chest: Effort normal and breath sounds normal.  Abdominal: Soft.  RUQ Kocher scar, moderate panniculus  Neurological: She is alert and oriented to person, place, and time.  Psychiatric: She has a normal mood and affect.   No axillary or supraclavicular adenopathy No palpable breast masses SN to nipple R 30 cm L 31.5 cm BW R 17 L 17 cm Nipple to IMF R 7.5 cm L 8 cm Grade 2 ptosis bilat   R breast with radial scar at appr 10 o clock position L breast with transverse scar upper outer quadrant Bilateral folds/lipomas of axilla  Assessment:     Breast cancer R    Plan:     Reviewed multiple stages in  breast reconstruction. Reviewed plan for skin sparing mastectomy, submuscular placement TE, use of acellular dermis, drains, incisions, time in hospital, drains, post op restrictions. Risks including but not limited to bleeding, infection, anesthesia, scar, DVT/PE, cardiopulmonary complications, implant rupture, contracture, extrusion, infection, seroma, hematoma reviewed.

## 2013-07-11 MED ORDER — CEFAZOLIN SODIUM-DEXTROSE 2-3 GM-% IV SOLR: 2.0000 g | INTRAVENOUS | Status: DC

## 2013-07-12 ENCOUNTER — Ambulatory Visit (HOSPITAL_COMMUNITY): Payer: BC Managed Care – PPO | Admitting: Anesthesiology

## 2013-07-12 ENCOUNTER — Encounter (HOSPITAL_COMMUNITY): Payer: Self-pay | Admitting: *Deleted

## 2013-07-12 ENCOUNTER — Encounter (HOSPITAL_COMMUNITY): Payer: BC Managed Care – PPO | Admitting: Anesthesiology

## 2013-07-12 ENCOUNTER — Ambulatory Visit (HOSPITAL_COMMUNITY)
Admission: RE | Admit: 2013-07-12 | Discharge: 2013-07-14 | Disposition: A | Discharge status: Home / Self Care | Payer: BC Managed Care – PPO | Source: Ambulatory Visit | Attending: General Surgery | Admitting: General Surgery

## 2013-07-12 ENCOUNTER — Encounter (HOSPITAL_COMMUNITY)
Admission: RE | Admit: 2013-07-12 | Discharge: 2013-07-12 | Disposition: A | Discharge status: Home / Self Care | Payer: BC Managed Care – PPO | Source: Ambulatory Visit | Attending: General Surgery | Admitting: General Surgery

## 2013-07-12 ENCOUNTER — Encounter (HOSPITAL_COMMUNITY)
Admission: RE | Disposition: A | Discharge status: Home / Self Care | Payer: Self-pay | Source: Ambulatory Visit | Attending: General Surgery

## 2013-07-12 DIAGNOSIS — C50411 Malignant neoplasm of upper-outer quadrant of right female breast: Secondary | ICD-10-CM

## 2013-07-12 DIAGNOSIS — Y836 Removal of other organ (partial) (total) as the cause of abnormal reaction of the patient, or of later complication, without mention of misadventure at the time of the procedure: Secondary | ICD-10-CM | POA: Insufficient documentation

## 2013-07-12 DIAGNOSIS — Z01812 Encounter for preprocedural laboratory examination: Secondary | ICD-10-CM | POA: Insufficient documentation

## 2013-07-12 DIAGNOSIS — D0590 Unspecified type of carcinoma in situ of unspecified breast: Secondary | ICD-10-CM | POA: Insufficient documentation

## 2013-07-12 DIAGNOSIS — K929 Disease of digestive system, unspecified: Secondary | ICD-10-CM | POA: Insufficient documentation

## 2013-07-12 DIAGNOSIS — Z87891 Personal history of nicotine dependence: Secondary | ICD-10-CM | POA: Insufficient documentation

## 2013-07-12 DIAGNOSIS — I10 Essential (primary) hypertension: Secondary | ICD-10-CM | POA: Insufficient documentation

## 2013-07-12 DIAGNOSIS — R112 Nausea with vomiting, unspecified: Secondary | ICD-10-CM | POA: Insufficient documentation

## 2013-07-12 DIAGNOSIS — Z170 Estrogen receptor positive status [ER+]: Secondary | ICD-10-CM | POA: Insufficient documentation

## 2013-07-12 DIAGNOSIS — C50919 Malignant neoplasm of unspecified site of unspecified female breast: Secondary | ICD-10-CM | POA: Insufficient documentation

## 2013-07-12 DIAGNOSIS — L821 Other seborrheic keratosis: Secondary | ICD-10-CM | POA: Insufficient documentation

## 2013-07-12 HISTORY — DX: Gastro-esophageal reflux disease without esophagitis: K21.9

## 2013-07-12 HISTORY — PX: BREAST RECONSTRUCTION WITH PLACEMENT OF TISSUE EXPANDER AND FLEX HD (ACELLULAR HYDRATED DERMIS): SHX6295

## 2013-07-12 HISTORY — PX: MASTECTOMY W/ SENTINEL NODE BIOPSY: SHX2001

## 2013-07-12 HISTORY — DX: Cerebral infarction, unspecified: I63.9

## 2013-07-12 SURGERY — MASTECTOMY WITH SENTINEL LYMPH NODE BIOPSY
Anesthesia: General | Site: Chest | Laterality: Right | Wound class: Clean

## 2013-07-12 MED ORDER — DEXAMETHASONE SODIUM PHOSPHATE 10 MG/ML IJ SOLN
INTRAMUSCULAR | Status: DC | PRN
  Administered 2013-07-12: 8 mg via INTRAVENOUS

## 2013-07-12 MED ORDER — ARTIFICIAL TEARS OP OINT
TOPICAL_OINTMENT | OPHTHALMIC | Status: DC | PRN
  Administered 2013-07-12: 1 via OPHTHALMIC

## 2013-07-12 MED ORDER — FENTANYL CITRATE 0.05 MG/ML IJ SOLN
INTRAMUSCULAR | Status: AC
  Administered 2013-07-12: 100 ug
  Filled 2013-07-12: qty 2

## 2013-07-12 MED ORDER — PANTOPRAZOLE SODIUM 40 MG IV SOLR
40.0000 mg | Freq: Every day | INTRAVENOUS | Status: DC
  Administered 2013-07-12 – 2013-07-13 (×2): 40 mg via INTRAVENOUS
  Filled 2013-07-12 (×3): qty 40

## 2013-07-12 MED ORDER — CEFAZOLIN SODIUM-DEXTROSE 2-3 GM-% IV SOLR
2.0000 g | Freq: Three times a day (TID) | INTRAVENOUS | Status: AC
  Administered 2013-07-12 – 2013-07-13 (×3): 2 g via INTRAVENOUS
  Filled 2013-07-12 (×4): qty 50

## 2013-07-12 MED ORDER — GLYCOPYRROLATE 0.2 MG/ML IJ SOLN
INTRAMUSCULAR | Status: DC | PRN
  Administered 2013-07-12: .8 mg via INTRAVENOUS

## 2013-07-12 MED ORDER — SODIUM CHLORIDE 0.9 % IR SOLN
Status: DC | PRN
  Administered 2013-07-12: 14:00:00

## 2013-07-12 MED ORDER — OXYCODONE HCL 5 MG PO TABS
5.0000 mg | ORAL_TABLET | ORAL | Status: DC | PRN
  Administered 2013-07-12 (×2): 5 mg via ORAL
  Filled 2013-07-12 (×2): qty 1

## 2013-07-12 MED ORDER — METHYLENE BLUE 1 % INJ SOLN
INTRAMUSCULAR | Status: AC
  Filled 2013-07-12: qty 10

## 2013-07-12 MED ORDER — MIDAZOLAM HCL 2 MG/2ML IJ SOLN
INTRAMUSCULAR | Status: AC
  Administered 2013-07-12: 2 mg
  Filled 2013-07-12: qty 2

## 2013-07-12 MED ORDER — SUCCINYLCHOLINE CHLORIDE 20 MG/ML IJ SOLN
INTRAMUSCULAR | Status: DC | PRN
  Administered 2013-07-12: 100 mg via INTRAVENOUS

## 2013-07-12 MED ORDER — LACTATED RINGERS IV SOLN
INTRAVENOUS | Status: DC | PRN
  Administered 2013-07-12 (×3): via INTRAVENOUS

## 2013-07-12 MED ORDER — RISPERIDONE 1 MG PO TABS
1.0000 mg | ORAL_TABLET | Freq: Every day | ORAL | Status: DC
  Filled 2013-07-12: qty 1

## 2013-07-12 MED ORDER — MIDAZOLAM HCL 5 MG/5ML IJ SOLN
INTRAMUSCULAR | Status: DC | PRN
  Administered 2013-07-12 (×2): 1 mg via INTRAVENOUS

## 2013-07-12 MED ORDER — SODIUM CHLORIDE 0.9 % IV SOLN
INTRAVENOUS | Status: DC
  Administered 2013-07-12 – 2013-07-13 (×2): via INTRAVENOUS

## 2013-07-12 MED ORDER — 0.9 % SODIUM CHLORIDE (POUR BTL) OPTIME
TOPICAL | Status: DC | PRN
  Administered 2013-07-12: 2000 mL

## 2013-07-12 MED ORDER — FENTANYL CITRATE 0.05 MG/ML IJ SOLN
INTRAMUSCULAR | Status: DC | PRN
  Administered 2013-07-12 (×2): 50 ug via INTRAVENOUS
  Administered 2013-07-12: 100 ug via INTRAVENOUS
  Administered 2013-07-12 (×4): 50 ug via INTRAVENOUS

## 2013-07-12 MED ORDER — RISPERIDONE 0.25 MG PO TABS
1.0000 mg | ORAL_TABLET | Freq: Every day | ORAL | Status: DC
  Administered 2013-07-12 – 2013-07-13 (×2): 1 mg via ORAL
  Filled 2013-07-12 (×3): qty 4

## 2013-07-12 MED ORDER — MORPHINE SULFATE 2 MG/ML IJ SOLN
INTRAMUSCULAR | Status: AC
  Filled 2013-07-12: qty 1

## 2013-07-12 MED ORDER — ONDANSETRON HCL 4 MG/2ML IJ SOLN
INTRAMUSCULAR | Status: DC | PRN
  Administered 2013-07-12 (×2): 4 mg via INTRAVENOUS

## 2013-07-12 MED ORDER — TECHNETIUM TC 99M SULFUR COLLOID FILTERED
1.0000 | Freq: Once | INTRAVENOUS | Status: AC | PRN
  Administered 2013-07-12: 1 via INTRADERMAL

## 2013-07-12 MED ORDER — ACETAMINOPHEN 325 MG PO TABS: 650.0000 mg | ORAL_TABLET | Freq: Four times a day (QID) | ORAL | Status: DC | PRN

## 2013-07-12 MED ORDER — DIAZEPAM 5 MG PO TABS
5.0000 mg | ORAL_TABLET | Freq: Three times a day (TID) | ORAL | Status: DC | PRN
  Administered 2013-07-13: 5 mg via ORAL
  Filled 2013-07-12: qty 1

## 2013-07-12 MED ORDER — NEOSTIGMINE METHYLSULFATE 1 MG/ML IJ SOLN
INTRAMUSCULAR | Status: DC | PRN
  Administered 2013-07-12: 5 mg via INTRAVENOUS

## 2013-07-12 MED ORDER — CEFAZOLIN SODIUM-DEXTROSE 2-3 GM-% IV SOLR
INTRAVENOUS | Status: AC
  Administered 2013-07-12: 2 g via INTRAVENOUS
  Filled 2013-07-12: qty 50

## 2013-07-12 MED ORDER — ROCURONIUM BROMIDE 100 MG/10ML IV SOLN
INTRAVENOUS | Status: DC | PRN
  Administered 2013-07-12: 20 mg via INTRAVENOUS
  Administered 2013-07-12 (×2): 10 mg via INTRAVENOUS

## 2013-07-12 MED ORDER — ALPRAZOLAM 0.5 MG PO TABS: 0.5000 mg | ORAL_TABLET | Freq: Every evening | ORAL | Status: DC | PRN

## 2013-07-12 MED ORDER — ONDANSETRON HCL 4 MG/2ML IJ SOLN
4.0000 mg | Freq: Four times a day (QID) | INTRAMUSCULAR | Status: DC | PRN
  Administered 2013-07-13: 4 mg via INTRAVENOUS
  Filled 2013-07-12: qty 2

## 2013-07-12 MED ORDER — ACETAMINOPHEN 650 MG RE SUPP: 650.0000 mg | Freq: Four times a day (QID) | RECTAL | Status: DC | PRN

## 2013-07-12 MED ORDER — PROPOFOL 10 MG/ML IV BOLUS
INTRAVENOUS | Status: DC | PRN
  Administered 2013-07-12: 200 mg via INTRAVENOUS

## 2013-07-12 MED ORDER — LIDOCAINE HCL (CARDIAC) 20 MG/ML IV SOLN
INTRAVENOUS | Status: DC | PRN
  Administered 2013-07-12: 100 mg via INTRAVENOUS

## 2013-07-12 MED ORDER — MORPHINE SULFATE 2 MG/ML IJ SOLN
2.0000 mg | INTRAMUSCULAR | Status: DC | PRN
  Administered 2013-07-12 – 2013-07-13 (×3): 2 mg via INTRAVENOUS
  Filled 2013-07-12 (×2): qty 1

## 2013-07-12 MED ORDER — LACTATED RINGERS IV SOLN
INTRAVENOUS | Status: DC
  Administered 2013-07-12: 10:00:00 via INTRAVENOUS

## 2013-07-12 MED ORDER — SODIUM CHLORIDE 0.9 % IJ SOLN
INTRAMUSCULAR | Status: AC
  Filled 2013-07-12: qty 10

## 2013-07-12 SURGICAL SUPPLY — 75 items
ADH SKN CLS APL DERMABOND .7 (GAUZE/BANDAGES/DRESSINGS) ×1
APL SKNCLS STERI-STRIP NONHPOA (GAUZE/BANDAGES/DRESSINGS) ×1
APPLIER CLIP 9.375 MED OPEN (MISCELLANEOUS)
APR CLP MED 9.3 20 MLT OPN (MISCELLANEOUS)
BAG DECANTER FOR FLEXI CONT (MISCELLANEOUS) IMPLANT
BENZOIN TINCTURE PRP APPL 2/3 (GAUZE/BANDAGES/DRESSINGS) ×2 IMPLANT
BINDER BREAST LRG (GAUZE/BANDAGES/DRESSINGS) IMPLANT
BINDER BREAST XLRG (GAUZE/BANDAGES/DRESSINGS) ×2 IMPLANT
BIOPATCH RED 1 DISK 7.0 (GAUZE/BANDAGES/DRESSINGS) ×2 IMPLANT
CANISTER SUCTION 2500CC (MISCELLANEOUS) ×4 IMPLANT
CHLORAPREP W/TINT 26ML (MISCELLANEOUS) ×2 IMPLANT
CLIP APPLIE 9.375 MED OPEN (MISCELLANEOUS) IMPLANT
CLOTH BEACON ORANGE TIMEOUT ST (SAFETY) IMPLANT
CONT SPEC 4OZ CLIKSEAL STRL BL (MISCELLANEOUS) ×2 IMPLANT
COUNTER NEEDLE 20 DBL MAG RED (NEEDLE) ×2 IMPLANT
COVER PROBE W GEL 5X96 (DRAPES) ×2 IMPLANT
COVER SURGICAL LIGHT HANDLE (MISCELLANEOUS) ×4 IMPLANT
DERMABOND ADVANCED (GAUZE/BANDAGES/DRESSINGS) ×1
DERMABOND ADVANCED .7 DNX12 (GAUZE/BANDAGES/DRESSINGS) ×1 IMPLANT
DRAIN CHANNEL 19F RND (DRAIN) ×2 IMPLANT
DRAPE LAPAROSCOPIC ABDOMINAL (DRAPES) ×2 IMPLANT
DRAPE ORTHO SPLIT 77X108 STRL (DRAPES)
DRAPE PROXIMA HALF (DRAPES) ×4 IMPLANT
DRAPE SURG 17X23 STRL (DRAPES) IMPLANT
DRAPE SURG ORHT 6 SPLT 77X108 (DRAPES) IMPLANT
DRAPE UTILITY XL STRL (DRAPES) ×6 IMPLANT
DRAPE WARM FLUID 44X44 (DRAPE) IMPLANT
DRSG PAD ABDOMINAL 8X10 ST (GAUZE/BANDAGES/DRESSINGS) ×2 IMPLANT
ELECT BLADE 4.0 EZ CLEAN MEGAD (MISCELLANEOUS) ×4
ELECT CAUTERY BLADE 6.4 (BLADE) ×2 IMPLANT
ELECT REM PT RETURN 9FT ADLT (ELECTROSURGICAL) ×2
ELECTRODE BLDE 4.0 EZ CLN MEGD (MISCELLANEOUS) ×2 IMPLANT
ELECTRODE REM PT RTRN 9FT ADLT (ELECTROSURGICAL) ×1 IMPLANT
EVACUATOR SILICONE 100CC (DRAIN) ×2 IMPLANT
GLOVE BIO SURGEON STRL SZ 6.5 (GLOVE) IMPLANT
GLOVE BIO SURGEON STRL SZ7 (GLOVE) ×2 IMPLANT
GLOVE BIOGEL PI IND STRL 7.5 (GLOVE) ×1 IMPLANT
GLOVE BIOGEL PI INDICATOR 7.5 (GLOVE) ×1
GOWN STRL NON-REIN LRG LVL3 (GOWN DISPOSABLE) ×10 IMPLANT
GRAFT FLEX HD 8X16 THICK (Tissue Mesh) ×2 IMPLANT
IMPL BREAST TIS EXP M 550CC (Breast) ×1 IMPLANT
IMPLANT BREAST TIS EXP M 550CC (Breast) ×2 IMPLANT
KIT BASIN OR (CUSTOM PROCEDURE TRAY) ×4 IMPLANT
KIT ROOM TURNOVER OR (KITS) ×4 IMPLANT
MARKER SKIN DUAL TIP RULER LAB (MISCELLANEOUS) IMPLANT
NEEDLE 18GX1X1/2 (RX/OR ONLY) (NEEDLE) ×2 IMPLANT
NEEDLE HYPO 25GX1X1/2 BEV (NEEDLE) ×2 IMPLANT
NS IRRIG 1000ML POUR BTL (IV SOLUTION) ×4 IMPLANT
PACK GENERAL/GYN (CUSTOM PROCEDURE TRAY) ×4 IMPLANT
PAD ARMBOARD 7.5X6 YLW CONV (MISCELLANEOUS) ×4 IMPLANT
PIN SAFETY STERILE (MISCELLANEOUS) ×2 IMPLANT
SET ASEPTIC TRANSFER (MISCELLANEOUS) ×2 IMPLANT
SPECIMEN JAR X LARGE (MISCELLANEOUS) ×2 IMPLANT
SPONGE GAUZE 4X4 12PLY (GAUZE/BANDAGES/DRESSINGS) ×4 IMPLANT
STAPLER VISISTAT 35W (STAPLE) ×2 IMPLANT
STRIP CLOSURE SKIN 1/2X4 (GAUZE/BANDAGES/DRESSINGS) ×2 IMPLANT
SUT ETHILON 2 0 FS 18 (SUTURE) ×2 IMPLANT
SUT MON AB 4-0 PC3 18 (SUTURE) ×2 IMPLANT
SUT MON AB 5-0 PS2 18 (SUTURE) IMPLANT
SUT PDS AB 2-0 CT1 27 (SUTURE) IMPLANT
SUT SILK 2 0 SH (SUTURE) ×2 IMPLANT
SUT SILK 3 0 SH 30 (SUTURE) ×2 IMPLANT
SUT VIC AB 2-0 SH 18 (SUTURE) ×2 IMPLANT
SUT VIC AB 3-0 54X BRD REEL (SUTURE) ×1 IMPLANT
SUT VIC AB 3-0 BRD 54 (SUTURE) ×2
SUT VIC AB 3-0 SH 18 (SUTURE) ×2 IMPLANT
SUT VIC AB 3-0 SH 27 (SUTURE) ×4
SUT VIC AB 3-0 SH 27X BRD (SUTURE) ×2 IMPLANT
SUT VIC AB 3-0 SH 8-18 (SUTURE) ×2 IMPLANT
SUT VICRYL 4-0 PS2 18IN ABS (SUTURE) ×2 IMPLANT
SYR CONTROL 10ML LL (SYRINGE) ×2 IMPLANT
TOWEL OR 17X24 6PK STRL BLUE (TOWEL DISPOSABLE) ×4 IMPLANT
TOWEL OR 17X26 10 PK STRL BLUE (TOWEL DISPOSABLE) ×4 IMPLANT
TRAY FOLEY CATH 14FRSI W/METER (CATHETERS) ×2 IMPLANT
WATER STERILE IRR 1000ML POUR (IV SOLUTION) IMPLANT

## 2013-07-12 NOTE — Interval H&P Note (Signed)
History and Physical Interval Note:  07/12/2013 12:09 PM  Morgan Santos  has presented today for surgery, with the diagnosis of right breast cancer  The various methods of treatment have been discussed with the patient and family. After consideration of risks, benefits and other options for treatment, the patient has consented to  Procedure(s): MASTECTOMY WITH SENTINEL LYMPH NODE BIOPSY (Right) RIGHT BREAST RECONSTRUCTION WITH PLACEMENT OF TISSUE EXPANDER AND FLEX HD TO RIGHT BREAST (ACELLULAR HYDRATED DERMIS) (Right) as a surgical intervention .  The patient's history has been reviewed, patient examined, no change in status, stable for surgery.  I have reviewed the patient's chart and labs.  Questions were answered to the patient's satisfaction.     Ariadna Setter

## 2013-07-12 NOTE — Op Note (Signed)
Operative Note   DATE OF OPERATION: 07/12/13  SURGICAL DIVISION: Plastic Surgery  PREOPERATIVE DIAGNOSES:  Right breast cancer  POSTOPERATIVE DIAGNOSES:  same  PROCEDURE:  1. R breast reconstruction with placement tissue expander 2. R breast reconstruction with acellular dermis (Flex HD) (6 x 12 cm) 3. Excision benign lesion left chest 1 cm.   SURGEON: Glenna Fellows MD MBA  ASSISTANT: none  ANESTHESIA:  General.   EBL: 200 ml for all procedures  COMPLICATIONS: None.   INDICATIONS FOR PROCEDURE:  The patient, Morgan Santos, is a 62 y.o. female born on 02/08/1951, is here for treatment of right breast cancer with immediate reconstruction.   FINDINGS: [place] style 9200 tissue expander placed, initial fill volume 210 ml. Lot [id number]  DESCRIPTION OF PROCEDURE:  The patient's operative site was marked with the patient in the preoperative area, includiing breast meridians, chest midline, anterior axillary line and inframammary fold. The patient was taken to the operating room. SCDs were placed and IV antibiotics were given. The patient's operative site was prepped and draped in a sterile fashion. A time out was performed and all information was confirmed to be correct.  Following completion of mastectomy the lateral border of pectoralis major muscle was elevated and inferior insertions of muscle divided, taking care to maintain the medial insertion muscle. Submuscular dissection completed toward clavicle. Wounds irrigated and hemostasis obtained. Flex HD substrate was perforated and sewn to inferior border of pectoralis with running 3-0 vicryl. The Flex HD was trimmed. The tissue expander was then prepared and filled with 60 ml sterile saline.  The tissue expander was placed in submuscular position. After ensuring correct placement on chest wall, the inferior border of Flex HD was tacked to serratus fascia and inframammary fold with 3-0 vicryl. 19 Fr. JP drain placed beneath mastectomy  flaps and secured to skin with 2-0 nylon suture. Incision closed with running 3-0 vicryl in superficial fascia followed by 4-0 vicryl in dermis. Skin closure completed with 4-0 monocryl subcuticular stitch. The port was then accessed and additional saline placed to total 210 ml.  Left chest dark seborrheic keratosis excised sharply at base and closure completed with 4-0 vicryl in dermis and running 4-0 monocryl for skin closure.  Dermabond applied over all incisions.  Drain dressed with Biopatch and Tegaderm. Dry dressing and breast binder placed.   The patient was allowed to wake from anesthesia, extubated and taken to the recovery room in satisfactory condition.   SPECIMENS:none  DRAINS: 19 Fr right subcutaneous chest

## 2013-07-12 NOTE — Transfer of Care (Signed)
Immediate Anesthesia Transfer of Care Note  Patient: Morgan Santos  Procedure(s) Performed: Procedure(s): MASTECTOMY WITH SENTINEL LYMPH NODE BIOPSY (Right) RIGHT BREAST RECONSTRUCTION WITH PLACEMENT OF TISSUE EXPANDER AND FLEX HD TO RIGHT BREAST (ACELLULAR HYDRATED DERMIS) (Right)  Patient Location: PACU  Anesthesia Type:General  Level of Consciousness: awake and patient cooperative  Airway & Oxygen Therapy: Patient Spontanous Breathing and Patient connected to face mask oxygen  Post-op Assessment: Report given to PACU RN and Post -op Vital signs reviewed and stable  Post vital signs: Reviewed and stable  Complications: No apparent anesthesia complications

## 2013-07-12 NOTE — H&P (View-Only) (Signed)
Subjective:     Patient ID: Morgan Santos, female   DOB: 07-04-1951, 62 y.o.   MRN: [id number]  HPI This is a 62 year old female I saw last week in our multidisciplinary conference. We discussed proceeding with a right mastectomy and a right sentinel lymph node biopsy. She was agreeable to that and she has recently been seen by plastic surgery. After a conversation with them she would like to proceed with right mastectomy and immediate breast reconstruction with implant-based reconstruction. She has had no changes since our last visit and she comes in today to discuss surgery.  Review of Systems     Objective:   Physical Exam Deferred today    Assessment:     Clinical stage II right breast cancer     Plan:     Right total mastectomy, right axillary sentinel lobe biopsy combined with immediate reconstruction  We discussed the possibilities included a nipple sparing mastectomy. I think her tumor is reasonable for that but Dr. Leta Baptist she agrees that this is probably not the best plan and I think that is also correct. We will proceed with a right total mastectomy including the nipple and the areola combined with a right axillary sentinel lymph node biopsy as well as immediate reconstruction. We discussed the risks of surgery again today. She does not want to receive a blood transfusion even if this was life saving. We'll plan on doing a sometime over the next couple weeks in combination with plastic surgery.

## 2013-07-12 NOTE — Brief Op Note (Signed)
07/12/2013  3:32 PM  PATIENT:  Morgan Santos  62 y.o. female  PRE-OPERATIVE DIAGNOSIS:  right breast cancer  POST-OPERATIVE DIAGNOSIS:  right breast cancer  PROCEDURE:  Procedure(s): MASTECTOMY WITH SENTINEL LYMPH NODE BIOPSY (Right) RIGHT BREAST RECONSTRUCTION WITH PLACEMENT OF TISSUE EXPANDER AND FLEX HD TO RIGHT BREAST (ACELLULAR HYDRATED DERMIS) (Right)  SURGEON:  Surgeon(s) and Role: Panel 1:    * Emelia Loron, MD - Primary  Panel 2:    * Glenna Fellows, MD - Primary  PHYSICIAN ASSISTANT:   ASSISTANTS: none   ANESTHESIA:   general  EBL:  Total I/O In: 2000 [I.V.:2000] Out: 500 [Urine:300; Blood:200]  BLOOD ADMINISTERED:none  DRAINS: (19 Fr) Jackson-Pratt drain(s) with closed bulb suction in the R breast   LOCAL MEDICATIONS USED:  NONE  SPECIMEN:  No Specimen  DISPOSITION OF SPECIMEN:  PATHOLOGY  COUNTS:  YES  TOURNIQUET:  * No tourniquets in log *  DICTATION: .Note written in EPIC  PLAN OF CARE: Admit for overnight observation  PATIENT DISPOSITION:  PACU - hemodynamically stable.   Delay start of Pharmacological VTE agent (>24hrs) due to surgical blood loss or risk of bleeding: no

## 2013-07-12 NOTE — Op Note (Signed)
Preoperative diagnosis: Clinical stage I right breast cancer Postoperative diagnosis: Same as above Procedure: #1 right skin sparing total mastectomy #2 right axillary sentinel lymph node biopsy Surgeon: Dr. Harden Mo  Assistant: Dr. Glenna Fellows Specimens: #1 right breast marked short stitch superior, long stitch lateral #2 right axillary sentinel nodes with the highest count of 787 Anesthesia: Gen. Estimated blood loss: Minimal Complications: None Sponge counts correct at end of my portion Disposition case turned over to plastic surgery for completion  Indications: This is a 61 year old female who was found to have a right breast cancer. She had MRI with a larger abnormal area as well as a second site that was biopsied. We discussed all of her different options and she elected to undergo mastectomy and immediate reconstruction with expander.  Procedure: After informed consent was obtained the patient was taken to the operating room. She first had technetium administered in the standard periareolar fashion. She was given 2 g of cefazolin. Sequential compression devices were on her legs. She was placed under general anesthesia without complication. Her right breast and axilla were then prepped and draped in the standard sterile surgical fashion. A surgical timeout was performed.  I made a teardrop shaped incision that encompassed her old right breast incision as well as her nipple and areolar complex. I then created flaps superiorly near the level of clavicle, medially to the sternum, inferiorly to the inframammary crease, and laterally to the lateral border of the breast. The breast was then removed from the pectoralis muscle including the fascia. This was then marked with a short stitch superior and a long stitch lateral. I then identified what appeared to be 3 small sentinel nodes together. The counts were as high as 787. I passed these out the table as well. There was no background  radioactivity essentially. We then obtained hemostasis. Irrigation was performed. I then turned the case over to plastic surgery for reconstruction.

## 2013-07-12 NOTE — Preoperative (Signed)
Beta Blockers   Reason not to administer Beta Blockers:Not Applicable 

## 2013-07-12 NOTE — Interval H&P Note (Signed)
History and Physical Interval Note:  07/12/2013 9:38 AM  Morgan Santos  has presented today for surgery, with the diagnosis of right breast cancer  The various methods of treatment have been discussed with the patient and family. After consideration of risks, benefits and other options for treatment, the patient has consented to  Procedure(s): MASTECTOMY WITH SENTINEL LYMPH NODE BIOPSY (Right) RIGHT BREAST RECONSTRUCTION WITH PLACEMENT OF TISSUE EXPANDER AND FLEX HD TO RIGHT BREAST (ACELLULAR HYDRATED DERMIS) (Right) as a surgical intervention .  The patient's history has been reviewed, patient examined, no change in status, stable for surgery.  I have reviewed the patient's chart and labs.  Questions were answered to the patient's satisfaction.     Tudor Chandley

## 2013-07-12 NOTE — Anesthesia Postprocedure Evaluation (Signed)
  Anesthesia Post-op Note  Patient: Morgan Santos  Procedure(s) Performed: Procedure(s): MASTECTOMY WITH SENTINEL LYMPH NODE BIOPSY (Right) RIGHT BREAST RECONSTRUCTION WITH PLACEMENT OF TISSUE EXPANDER AND FLEX HD TO RIGHT BREAST (ACELLULAR HYDRATED DERMIS) (Right)  Patient Location: PACU  Anesthesia Type:General  Level of Consciousness: awake  Airway and Oxygen Therapy: Patient Spontanous Breathing  Post-op Pain: mild  Post-op Assessment: Post-op Vital signs reviewed, Patient's Cardiovascular Status Stable, Respiratory Function Stable, Patent Airway, No signs of Nausea or vomiting and Pain level controlled  Post-op Vital Signs: Reviewed and stable  Complications: No apparent anesthesia complications

## 2013-07-12 NOTE — Anesthesia Procedure Notes (Signed)
Procedure Name: Intubation Date/Time: 07/12/2013 12:46 PM Performed by: Sherie Don Pre-anesthesia Checklist: Patient identified, Emergency Drugs available, Suction available, Patient being monitored and Timeout performed Patient Re-evaluated:Patient Re-evaluated prior to inductionOxygen Delivery Method: Circle system utilized Preoxygenation: Pre-oxygenation with 100% oxygen Intubation Type: IV induction Ventilation: Mask ventilation without difficulty Laryngoscope Size: Mac and 3 Grade View: Grade I Tube type: Oral Tube size: 7.5 mm Number of attempts: 1 Airway Equipment and Method: Stylet Placement Confirmation: ETT inserted through vocal cords under direct vision,  positive ETCO2 and breath sounds checked- equal and bilateral Secured at: 21 cm Tube secured with: Tape Dental Injury: Teeth and Oropharynx as per pre-operative assessment

## 2013-07-12 NOTE — Anesthesia Preprocedure Evaluation (Addendum)
Anesthesia Evaluation  Patient identified by MRN, date of birth, ID band  Reviewed: Allergy & Precautions, H&P , NPO status , Patient's Chart, lab work & pertinent test results  Airway Mallampati: II      Dental   Pulmonary former smoker,          Cardiovascular hypertension,     Neuro/Psych    GI/Hepatic Neg liver ROS, GERD-  ,  Endo/Other  negative endocrine ROS  Renal/GU negative Renal ROS     Musculoskeletal   Abdominal   Peds  Hematology   Anesthesia Other Findings   Reproductive/Obstetrics                          Anesthesia Physical Anesthesia Plan  ASA: III  Anesthesia Plan: General   Post-op Pain Management:    Induction: Intravenous  Airway Management Planned: Oral ETT  Additional Equipment:   Intra-op Plan:   Post-operative Plan: Extubation in OR  Informed Consent:   Plan Discussed with: CRNA, Anesthesiologist and Surgeon  Anesthesia Plan Comments:         Anesthesia Quick Evaluation

## 2013-07-13 MED ORDER — DIAZEPAM 5 MG PO TABS: 5.0000 mg | ORAL_TABLET | Freq: Three times a day (TID) | ORAL | Status: DC | PRN

## 2013-07-13 MED ORDER — KETOROLAC TROMETHAMINE 30 MG/ML IJ SOLN
30.0000 mg | Freq: Three times a day (TID) | INTRAMUSCULAR | Status: AC
  Administered 2013-07-13 – 2013-07-14 (×3): 30 mg via INTRAVENOUS
  Filled 2013-07-13 (×4): qty 1

## 2013-07-13 MED ORDER — CEPHALEXIN 500 MG PO CAPS: 500.0000 mg | ORAL_CAPSULE | Freq: Four times a day (QID) | ORAL | Status: DC

## 2013-07-13 MED ORDER — OXYCODONE HCL 5 MG PO TABS: 5.0000 mg | ORAL_TABLET | ORAL | Status: DC | PRN

## 2013-07-13 MED ORDER — PROMETHAZINE HCL 25 MG/ML IJ SOLN
12.5000 mg | Freq: Four times a day (QID) | INTRAMUSCULAR | Status: DC | PRN
  Administered 2013-07-13: 12.5 mg via INTRAVENOUS
  Filled 2013-07-13: qty 1

## 2013-07-13 NOTE — Progress Notes (Signed)
POD# 1 R breast recon with TE, Flex HD R mastectomy and SLN  Nausea and emesis, little sleep JP 223  oob to chair Alert, tired Mastectomy flaps viable, incision intact JP dark red watery  A/P Cont IVF, Phenegran added Will add Toradol Clear liquids ordered- home when pain controlled and able to tolerate diet Ambulate in halls today.

## 2013-07-13 NOTE — Progress Notes (Signed)
1 Day Post-Op R mastectomy Subjective: Pt with significant nausea overnight, unable to tolerate much of a diet.  Pain controlled.  Objective: Vital signs in last 24 hours: Temp:  [97.3 F (36.3 C)-98.4 F (36.9 C)] 98.1 F (36.7 C) (11/22 0604) Pulse Rate:  [56-84] 72 (11/22 0604) Resp:  [10-20] 16 (11/22 0604) BP: (109-149)/(48-83) 109/67 mmHg (11/22 0604) SpO2:  [92 %-100 %] 92 % (11/22 0604) Weight:  [252 lb 6.8 oz (114.5 kg)] 252 lb 6.8 oz (114.5 kg) (11/21 1710)   Intake/Output from previous day: 11/21 0701 - 11/22 0700 In: 3427.5 [P.O.:370; I.V.:2957.5; IV Piggyback:100] Out: 723 [Urine:300; Drains:223; Blood:200] Intake/Output this shift:     General appearance: alert and cooperative  Incision: no significant drainage JP: dark bloody output  Lab Results:  No results found for this basename: WBC, HGB, HCT, PLT,  in the last 72 hours BMET No results found for this basename: NA, K, CL, CO2, GLUCOSE, BUN, CREATININE, CALCIUM,  in the last 72 hours PT/INR No results found for this basename: LABPROT, INR,  in the last 72 hours ABG No results found for this basename: PHART, PCO2, PO2, HCO3,  in the last 72 hours  MEDS, Scheduled .  ceFAZolin (ANCEF) IV  2 g Intravenous Q8H  . pantoprazole (PROTONIX) IV  40 mg Intravenous QHS  . risperiDONE  1 mg Oral QHS    Studies/Results: Nm Sentinel Node Inj-no Rpt (breast)  07/12/2013   CLINICAL DATA: right axillary sentinel node biopsy   Sulfur colloid was injected intradermally by the nuclear medicine  technologist for breast cancer sentinel node localization.     Assessment: s/p Procedure(s): MASTECTOMY WITH SENTINEL LYMPH NODE BIOPSY RIGHT BREAST RECONSTRUCTION WITH PLACEMENT OF TISSUE EXPANDER AND FLEX HD TO RIGHT BREAST (ACELLULAR HYDRATED DERMIS) Patient Active Problem List   Diagnosis Date Noted  . Breast cancer of upper-outer quadrant of right female breast 06/19/2013    Post op nausea  Plan: will add  phenergan to help with nausea If she feels better later today, she can go home   LOS: 1 day     .Vanita Panda, MD [facility], [place] [phone]   07/13/2013 9:23 AM

## 2013-07-14 NOTE — Discharge Summary (Signed)
Physician Discharge Summary  Patient ID: Morgan Santos MRN: [id number] DOB/AGE: 1951/05/15 62 y.o.  Admit date: 07/12/2013 Discharge date: 07/14/2013  Admission Diagnoses: Clinical stage I right breast cancer  Discharge Diagnoses: same Active Problems:   * No active hospital problems. *   Discharged Condition: good  Hospital Course: The patient was admitted after mastectomy.  On POD 1 she remained in house due to post op nausea and vomiting.  This was resolved by POD 2 and she was in stable condition for d/c to home.  Consults: Plastic surgery  Significant Diagnostic Studies: none  Treatments: IV hydration, analgesia: acetaminophen w/ codeine and surgery: mastectomy  Discharge Exam: Blood pressure 138/76, pulse 82, temperature 97.2 F (36.2 C), temperature source Oral, resp. rate 18, height 5' 9.5" (1.765 m), weight 252 lb 6.8 oz (114.5 kg), SpO2 97.00%. General appearance: alert and cooperative Incision/Wound: clean, dry, intact JP: watery bloody discharge  Disposition: Home      Discharge Orders   Future Appointments Provider Department Dept Phone   08/05/2013 11:30 AM Victorino December, MD [facility] [phone]   Future Orders Complete By Expires   Call MD for:  redness, tenderness, or signs of infection (pain, swelling, bleeding, redness, odor or green/yellow discharge around incision site)  As directed    Call MD for:  severe or increased pain, loss or decreased feeling  in affected limb(s)  As directed    Discharge instructions  As directed    Comments:     Ok to shower, pat incision dry. Dry dressing and breast binder for comfort as needed. No strenuous exercise or activity. Ok to raise arms above shoulder level as tolerated.  JP to bulb suction, empty twice daily and bring record to clinic visit.   Driving Restrictions  As directed    Comments:     No driving for while on narcotics   Lifting restrictions  As directed    Comments:     No lifting greater than 5 lbs.   May shower   As directed    Resume previous diet  As directed        Medication List         ALPRAZolam 0.5 MG tablet  Commonly known as:  XANAX  Take 0.5 mg by mouth at bedtime as needed for anxiety.     aspirin 81 MG tablet  Take 81 mg by mouth daily.     cephALEXin 500 MG capsule  Commonly known as:  KEFLEX  Take 1 capsule (500 mg total) by mouth 4 (four) times daily.     diazepam 5 MG tablet  Commonly known as:  VALIUM  Take 1 tablet (5 mg total) by mouth every 8 (eight) hours as needed for anxiety or muscle spasms.     Flax Seed Oil 1000 MG Caps  Take 2 each by mouth daily.     oxyCODONE 5 MG immediate release tablet  Commonly known as:  Oxy IR/ROXICODONE  Take 1 tablet (5 mg total) by mouth every 4 (four) hours as needed for moderate pain.     risperiDONE 0.25 MG tablet  Commonly known as:  RISPERDAL  Take 1 mg by mouth at bedtime.     simvastatin 20 MG tablet  Commonly known as:  ZOCOR  Take 20 mg by mouth every evening.     Vitamin D-3 5000 UNITS Tabs  Take 2 each by mouth daily.       Follow-up Information  Follow up with Glenna Fellows, MD. (07/17/13 as scheduled)    Specialty:  Plastic Surgery   Contact information:   [address] [place], [address] [place] [address] [phone]       Follow up with Emelia Loron, MD. Schedule an appointment as soon as possible for a visit in 7 days.   Specialty:  General Surgery   Contact information:   [address] [place] [address] [phone]       Signed: Vanita Panda 07/14/2013, 9:01 AM

## 2013-07-14 NOTE — Progress Notes (Signed)
POD# 2 R breast recon with TE, Flex HD R mastectomy and SLN  Nausea and emesis resolved tol full breakfast JP 155  oob to chair Alert, tired Mastectomy flaps viable, incision intact JP dark red watery  A/P Will shower with assist here prior to d/c has f/u arranged 11/26

## 2013-07-16 ENCOUNTER — Encounter (HOSPITAL_COMMUNITY): Payer: Self-pay | Admitting: General Surgery

## 2013-08-05 ENCOUNTER — Ambulatory Visit: Payer: BC Managed Care – PPO | Admitting: Oncology

## 2013-08-08 ENCOUNTER — Encounter: Payer: Self-pay | Admitting: *Deleted

## 2013-08-08 ENCOUNTER — Ambulatory Visit (INDEPENDENT_AMBULATORY_CARE_PROVIDER_SITE_OTHER): Payer: BC Managed Care – PPO | Admitting: General Surgery

## 2013-08-08 ENCOUNTER — Encounter (INDEPENDENT_AMBULATORY_CARE_PROVIDER_SITE_OTHER): Payer: Self-pay | Admitting: General Surgery

## 2013-08-08 VITALS — BP 124/82 | HR 82 | Resp 18 | Ht 69.5 in | Wt 244.0 lb

## 2013-08-08 DIAGNOSIS — Z09 Encounter for follow-up examination after completed treatment for conditions other than malignant neoplasm: Secondary | ICD-10-CM

## 2013-08-08 NOTE — Progress Notes (Signed)
Ordered oncotype 08/08/13.  Faxed PAC to [facility] and faxed requisition form to Pathology.  Confirmed receipt of requisition with Jeannie in pathology.

## 2013-08-08 NOTE — Progress Notes (Signed)
Subjective:     Patient ID: Morgan Santos, female   DOB: December 17, 1950, 62 y.o.   MRN: [id number]  HPI This is a 62 year old female who underwent a right mastectomy. 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 There is lymph vascular invasion. She has a very close margin less than 1 mm for invasive ductal carcinoma. This is her anterior margin there is not really any more tissue here at all. This is a normal fibroglandular boundary of the breast. She has 2 lymph nodes are both negative. HER-2/neu is not amplified. Recently her estrogen receptor was +89% and progesterone was positive at 8%. Proliferation markers 11%. She has both of her drains out and has been expanded already as well. She missed her recent appt with medical oncology.  Review of Systems     Objective:   Physical Exam  Pulmonary/Chest:         Assessment:     Stage I right breast cancer     Plan:     She needs to follow up with medical oncology. Will give her information on abc pt class.  We discussed pathology today.

## 2013-08-20 ENCOUNTER — Encounter: Payer: Self-pay | Admitting: *Deleted

## 2013-08-20 ENCOUNTER — Encounter (HOSPITAL_COMMUNITY): Payer: Self-pay

## 2013-08-20 NOTE — Progress Notes (Signed)
Received Oncotype Dx results of 23.  Placed a copy in Dr. Milta Deiters box and took a copy to Med Rec to scan.

## 2013-08-21 ENCOUNTER — Telehealth: Payer: Self-pay | Admitting: Oncology

## 2013-08-21 NOTE — Telephone Encounter (Signed)
, °

## 2013-08-27 ENCOUNTER — Telehealth: Payer: Self-pay | Admitting: Emergency Medicine

## 2013-08-27 NOTE — Telephone Encounter (Signed)
Spoke with patient; informed her of new appointment for 09/05/13 at 1:00.

## 2013-09-05 ENCOUNTER — Telehealth: Payer: Self-pay | Admitting: Oncology

## 2013-09-05 ENCOUNTER — Telehealth: Payer: Self-pay | Admitting: *Deleted

## 2013-09-05 ENCOUNTER — Encounter: Payer: Self-pay | Admitting: Oncology

## 2013-09-05 ENCOUNTER — Ambulatory Visit (HOSPITAL_BASED_OUTPATIENT_CLINIC_OR_DEPARTMENT_OTHER): Payer: PRIVATE HEALTH INSURANCE | Admitting: Oncology

## 2013-09-05 VITALS — BP 129/78 | HR 67 | Temp 97.8°F | Resp 20 | Ht 69.5 in | Wt 241.4 lb

## 2013-09-05 DIAGNOSIS — Z170 Estrogen receptor positive status [ER+]: Secondary | ICD-10-CM

## 2013-09-05 DIAGNOSIS — C50411 Malignant neoplasm of upper-outer quadrant of right female breast: Secondary | ICD-10-CM

## 2013-09-05 DIAGNOSIS — C50419 Malignant neoplasm of upper-outer quadrant of unspecified female breast: Secondary | ICD-10-CM

## 2013-09-05 MED ORDER — LORAZEPAM 0.5 MG PO TABS
0.5000 mg | ORAL_TABLET | Freq: Four times a day (QID) | ORAL | Status: DC | PRN
Start: 2013-09-05 — End: 2013-12-05

## 2013-09-05 MED ORDER — ONDANSETRON HCL 8 MG PO TABS
8.0000 mg | ORAL_TABLET | Freq: Two times a day (BID) | ORAL | Status: DC
Start: 2013-09-05 — End: 2013-12-05

## 2013-09-05 MED ORDER — DEXAMETHASONE 4 MG PO TABS: 8.0000 mg | ORAL_TABLET | Freq: Two times a day (BID) | ORAL | Status: DC

## 2013-09-05 MED ORDER — PROCHLORPERAZINE MALEATE 10 MG PO TABS: 10.0000 mg | ORAL_TABLET | Freq: Four times a day (QID) | ORAL | Status: DC | PRN

## 2013-09-05 NOTE — Telephone Encounter (Signed)
Per staff message and POF I have scheduled appts.  JMW  

## 2013-09-05 NOTE — Telephone Encounter (Signed)
, °

## 2013-09-09 NOTE — Progress Notes (Signed)
OFFICE PROGRESS NOTE  CC**  Morgan Sleeper, PA-C [address] [place] [address] Dr. Rolm Santos  Dr. Thea Santos  DIAGNOSIS: 63 year old female with new diagnosis of right breast cancer  STAGE:  Breast cancer of upper-outer quadrant of right female breast  Primary site: Breast (Right)  Staging method: AJCC 7th Edition  Clinical: Stage IA (T1c, N0, cM0)  Summary: Stage IA (T1c, N0, cM0)   PRIOR THERAPY: #1 Patient underwent a screening mammogram and was found to have a right breast mass laterally. An ultrasound showed a 7 mm lesion at the 9:00 position 7 cm from the nipple. She was also found to have a second medial lesion at the 9:00 position 4 cm from the nipple. She underwent a biopsy of both lesions. 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 The second lesion also revealed invasive ductal carcinoma again ER positive PR positive HER-2/neu negative with a proliferation marker Ki-67 11%. She had an MRI performed that revealed 1.3. cm postbiopsy changes. She also was noted to have area of concern from anterior to posterior enhancement. This area measured 6 cm  #2 patient is status post right mastectomy done 07/12/2013. Pathology revealed:  1. Breast, simple mastectomy, Right - INVASIVE GRADE I DUCTAL CARCINOMA, TWO FOCI MEASURING 1.2 CM AND 1.1 CM IN 1 of 4 FINAL for Morgan Santos ([id number]) Diagnosis(continued) GREATEST DIMENSION. - INTERMEDIATE GRADE DUCTAL CARCINOMA IN SITU IS PRESENT. - LYMPH/VASCULAR INVASION IS IDENTIFIED. - INVASIVE DUCTAL CARCINOMA IS EXTREMELY CLOSE (LESS THAN 0.1 CM) TO ANTERIOR MARGIN. - OTHER MARGINS ARE NEGATIVE. - SEE ONCOLOGY TEMPLATE. 2. Lymph node, sentinel, biopsy, Right - ONE BENIGN LYMPH WITH NO TUMOR SEEN (0/1). - SEE COMMENT. 3. Lymph node, sentinel, biopsy, Right -  ONE BENIGN LYMPH NODE WITH NO TUMOR SEEN (0/1). - SEE COMMENT.  #3 patient had Oncotype DX testing performed. d The patient's recurrence score is 23. Those patients who had a recurrence score of 23 had an average rate of distant recurrence of 14%. (Morgan Santos:ecj 08/20/2013) . This is in the intermediate risk category. We discussed adjuvant chemotherapy consisting of 4 cycles of Taxotere and Cytoxan   CURRENT THERAPY: Proceed with adjuvant chemotherapy   INTERVAL HISTORY: Morgan Santos 63 y.o. female returns for followup visit after her mastectomy. Clinically patient seems to be doing well she tolerated these surgical procedure well. She was found to have multifocal disease with 1 focus of cancer measuring a 1.2 and the other one a 1.1. Nodes were negative. Oncotype results as noted above. Today she denies any headaches double vision blurring of vision fevers chills or night sweats. She is tender at the surgical site. Remainder of the 10 point review of systems is negative.  MEDICAL HISTORY: Past Medical History  Diagnosis Date  . Breast cancer   . Anxiety   . Hypertension     "when I get upset"  . Depression     pt. remarks that she "takes respridal because if I don't take it I get angry"   . GERD (gastroesophageal reflux disease)     pt. uses vinegar for heartburn, ususally once per week   . Arthritis     hip- R, back   . Stroke     2010, [facility] for stroke    ALLERGIES:  is allergic to codeine and multihance.  MEDICATIONS:  Current Outpatient  Prescriptions  Medication Sig Dispense Refill  . ALPRAZolam (XANAX) 0.5 MG tablet Take 0.5 mg by mouth at bedtime as needed for anxiety.       Marland Kitchen aspirin 81 MG tablet Take 81 mg by mouth daily.      . Cholecalciferol (VITAMIN D-3) 5000 UNITS TABS Take 2 each by mouth daily.      . diazepam (VALIUM) 5 MG tablet Take 1 tablet (5 mg total) by mouth every 8 (eight) hours as needed for anxiety or muscle spasms.  30 tablet  0  . Flaxseed,  Linseed, (FLAX SEED OIL) 1000 MG CAPS Take 2 each by mouth daily.      . risperiDONE (RISPERDAL) 0.25 MG tablet Take 1 mg by mouth at bedtime.      . simvastatin (ZOCOR) 20 MG tablet Take 20 mg by mouth every evening.      Marland Kitchen dexamethasone (DECADRON) 4 MG tablet Take 2 tablets (8 mg total) by mouth 2 (two) times daily with a meal. Take two times a day the day before Taxotere. Then take two times a day starting the day after chemo for 3 days.  30 tablet  1  . LORazepam (ATIVAN) 0.5 MG tablet Take 1 tablet (0.5 mg total) by mouth every 6 (six) hours as needed (Nausea or vomiting).  30 tablet  0  . ondansetron (ZOFRAN) 8 MG tablet Take 1 tablet (8 mg total) by mouth 2 (two) times daily. Take two times a day starting the day after chemo for 3 days. Then take two times a day as needed for nausea or vomiting.  30 tablet  1  . oxyCODONE (OXY IR/ROXICODONE) 5 MG immediate release tablet Take 1 tablet (5 mg total) by mouth every 4 (four) hours as needed for moderate pain.  30 tablet  0  . prochlorperazine (COMPAZINE) 10 MG tablet Take 1 tablet (10 mg total) by mouth every 6 (six) hours as needed (Nausea or vomiting).  30 tablet  1   No current facility-administered medications for this visit.    SURGICAL HISTORY:  Past Surgical History  Procedure Laterality Date  . Cholecystectomy    . Abdominal hysterectomy    . Breast surgery      for abcesses- removed from both breasts, many yrs. ago  . Mastectomy w/ sentinel node biopsy Right 07/12/2013    Procedure: MASTECTOMY WITH SENTINEL LYMPH NODE BIOPSY;  Surgeon: Morgan Bookbinder, MD;  Location: [facility];  Service: General;  Laterality: Right;  . Breast reconstruction with placement of tissue expander and flex hd (acellular hydrated dermis) Right 07/12/2013    Procedure: RIGHT BREAST RECONSTRUCTION WITH PLACEMENT OF TISSUE EXPANDER AND FLEX HD TO RIGHT BREAST (ACELLULAR HYDRATED DERMIS);  Surgeon: Irene Limbo, MD;  Location: [facility];  Service: Plastics;   Laterality: Right;    REVIEW OF SYSTEMS:  Pertinent items are noted in HPI.    PHYSICAL EXAMINATION: Blood pressure 129/78, pulse 67, temperature 97.8 F (36.6 C), temperature source Oral, resp. rate 20, height 5' 9.5" (1.765 m), weight 241 lb 6.4 oz (109.498 kg). Body mass index is 35.15 kg/(m^2). ECOG PERFORMANCE STATUS: 0 - Asymptomatic   General appearance: alert, cooperative and appears stated age Lymph nodes: Cervical, supraclavicular, and axillary nodes normal. Resp: clear to auscultation bilaterally Back: symmetric, no curvature. ROM normal. No CVA tenderness. Cardio: regular rate and rhythm GI: soft, non-tender; bowel sounds normal; no masses,  no organomegaly Extremities: extremities normal, atraumatic, no cyanosis or edema Neurologic: Grossly normal   LABORATORY DATA:  Lab Results  Component Value Date   WBC 6.2 07/09/2013   HGB 14.2 07/09/2013   HCT 41.7 07/09/2013   MCV 88.3 07/09/2013   PLT 206 07/09/2013      Chemistry      Component Value Date/Time   NA 141 07/09/2013 1310   NA 145 06/19/2013 0847   K 4.0 07/09/2013 1310   K 3.8 06/19/2013 0847   CL 105 07/09/2013 1310   CO2 26 07/09/2013 1310   CO2 25 06/19/2013 0847   BUN 15 07/09/2013 1310   BUN 11.0 06/19/2013 0847   CREATININE 0.82 07/09/2013 1310   CREATININE 0.8 06/19/2013 0847      Component Value Date/Time   CALCIUM 9.6 07/09/2013 1310   CALCIUM 9.6 06/19/2013 0847   ALKPHOS 102 06/19/2013 0847   AST 19 06/19/2013 0847   ALT 15 06/19/2013 0847   BILITOT 0.73 06/19/2013 0847     ADDITIONAL INFORMATION: 1. A sample (block 1E) was sent to [facility] for Oncotype testing. The patient's recurrence score is 23. Those patients who had a recurrence score of 23 had an average rate of distant recurrence of 14%. (Morgan Santos:ecj 08/20/2013) Enid Cutter MD Pathologist, Electronic Signature ( Signed 08/20/2013) 1. CHROMOGENIC IN-SITU HYBRIDIZATION Results: HER-2/NEU BY CISH - NO AMPLIFICATION OF  HER-2 DETECTED. RESULT RATIO OF HER2: CEP 17 SIGNALS 1.00 AVERAGE HER2 COPY NUMBER PER CELL 2.00 REFERENCE RANGE NEGATIVE HER2/Chr17 Ratio <2.0 and Average HER2 copy number <4.0 EQUIVOCAL HER2/Chr17 Ratio <2.0 and Average HER2 copy number 4.0 and <6.0 POSITIVE HER2/Chr17 Ratio >=2.0 and/or Average HER2 copy number >=6.0 Claudette Laws MD Pathologist, Electronic Signature ( Signed 08/07/2013) FINAL DIAGNOSIS Diagnosis 1. Breast, simple mastectomy, Right - INVASIVE GRADE I DUCTAL CARCINOMA, TWO FOCI MEASURING 1.2 CM AND 1.1 CM IN 1 of 4 FINAL for SYDNI, ELIZARRARAZ ([id number]) Diagnosis(continued) GREATEST DIMENSION. - INTERMEDIATE GRADE DUCTAL CARCINOMA IN SITU IS PRESENT. - LYMPH/VASCULAR INVASION IS IDENTIFIED. - INVASIVE DUCTAL CARCINOMA IS EXTREMELY CLOSE (LESS THAN 0.1 CM) TO ANTERIOR MARGIN. - OTHER MARGINS ARE NEGATIVE. - SEE ONCOLOGY TEMPLATE. 2. Lymph node, sentinel, biopsy, Right - ONE BENIGN LYMPH WITH NO TUMOR SEEN (0/1). - SEE COMMENT. 3. Lymph node, sentinel, biopsy, Right - ONE BENIGN LYMPH NODE WITH NO TUMOR SEEN (0/1). - SEE COMMENT. Microscopic Comment 1. BREAST, INVASIVE TUMOR, WITH LYMPH NODE SAMPLING Specimen, including laterality and lymph node sampling (sentinel, non-sentinel): Right partial breast with sentinel lymph node sampling. Procedure: Right simple mastectomy with sentinel lymph node biopsies. Histologic type: Ductal carcinoma. Grade: Both tumors are morphologically similar and Grade I with the following characteristics: Tubule formation: 3. Nuclear pleomorphism: 1. Mitotic:1. Tumor size (gross measurement): 1.2 cm and 1.1 cm in greatest dimension. Margins: Invasive, distance to closest margin: Less than 0.1 cm to anterior margin, other margins negative. In-situ, distance to closest margin: At least 0.2 cm. Lymphovascular invasion: Yes, present. Tumor focality: Multifocal, two foci. Treatment effect: Not applicable. Extent of tumor: Skin: Not  involved. Nipple: Not involved. Skeletal muscle: Not received. Lymph nodes: Examined: 2 Sentinel. 0 Non-sentinel. 2 Total. Lymph nodes with metastasis: 0. Breast prognostic profile: Performed on previous case, [id number]. Breast prognostic profile performed on larger tumor mass (ribbon clip): Estrogen receptor: 99%, positive. Progesterone receptor: 99%, positive. Her 2 neu: Ratio is 0.98, not amplified. Ki-67: 11%. Breast prognostic profile performed on smaller mass: Estrogen receptor: 89%, positive. Progesterone receptor: 8%, positive. Her 2 neu by CISH: 1.31, ratio, not amplified. Ki-67: 11%. Non-neoplastic breast: Fibrocystic changes, fibroadenoma formation. 2 of 4  FINAL for DYLIN, IHNEN ([id number]) Microscopic Comment(continued) TNM: mpT1c, pN0, MX. Comments: An E-cadherin stain is performed on both tumors which is positive in both confirming their ductal nature. As both masses appear morphologically similar and the previous Her-2 neu by CISH studies were negative (not amplified) only a single Her-2 neu by CISH will be repeated (on the larger tumor mass) unless otherwise requested. (RH:ecj 07/15/2013) 2. - 3: As there is a single cell pattern present in the invasive ductal carcinoma in the right mastectomy specimen, a cytokeratin AE1/AE3 stain is performed on each lymph node. The stains are negative confirming the lack of metastatic carcinoma.   RADIOGRAPHIC STUDIES:  No results found.  ASSESSMENT: 63 year old female with  #1 stage I (T1 N0 MX) invasive ductal carcinoma of the breast. Status post mastectomy. The final pathology revealed 2 foci of disease one measuring 1.1 cm and a second 1.2 cm. Tumor was ER positive PR positive HER-2/neu negative with a proliferation marker Ki-67 11%. Sentinel node was negative. There was lymphovascular invasion identified.  #2 we discussed the pathology in detail today. We discussed Oncotype DX results as well. She understands it  is in the intermediate risk category giving her a 14% risk of distant recurrence with tamoxifen only. We discussed role of adjuvant chemotherapy to further reduce her risk of distant recurrence. Recommendation is adjuvant Taxotere and Cytoxan every 3 weeks for a total of 4 cycles. We discussed side effects risks and benefits of treatment. She has consented.  #3 patient will need a Port-A-Cath placement as well as chemotherapy teaching class. We will get this organized.   PLAN:   #1 proceed with Port-A-Cath placement  #2 chemotherapy teaching class.  #3 I will plan on starting her on chemotherapy on 09/26/2013   All questions were answered. The patient knows to call the clinic with any problems, questions or concerns. We can certainly see the patient much sooner if necessary.  I spent 30 minutes counseling the patient face to face. The total time spent in the appointment was 30 minutes.    Marcy Panning, MD Medical/Oncology [facility] [phone] (beeper) [phone] (Office)  09/09/2013, 10:46 AM

## 2013-09-10 ENCOUNTER — Other Ambulatory Visit (INDEPENDENT_AMBULATORY_CARE_PROVIDER_SITE_OTHER): Payer: Self-pay | Admitting: General Surgery

## 2013-09-12 ENCOUNTER — Other Ambulatory Visit: Payer: Self-pay | Admitting: *Deleted

## 2013-09-12 ENCOUNTER — Encounter: Payer: Self-pay | Admitting: *Deleted

## 2013-09-12 ENCOUNTER — Other Ambulatory Visit: Payer: PRIVATE HEALTH INSURANCE

## 2013-09-12 DIAGNOSIS — C50919 Malignant neoplasm of unspecified site of unspecified female breast: Secondary | ICD-10-CM

## 2013-09-12 MED ORDER — LIDOCAINE-PRILOCAINE 2.5-2.5 % EX CREA: 1.0000 "application " | TOPICAL_CREAM | CUTANEOUS | Status: DC | PRN

## 2013-09-13 ENCOUNTER — Other Ambulatory Visit (HOSPITAL_COMMUNITY): Payer: Self-pay | Admitting: *Deleted

## 2013-09-13 ENCOUNTER — Encounter (HOSPITAL_COMMUNITY)
Admission: RE | Admit: 2013-09-13 | Discharge: 2013-09-13 | Disposition: A | Discharge status: Home / Self Care | Payer: PRIVATE HEALTH INSURANCE | Source: Ambulatory Visit | Attending: Anesthesiology | Admitting: Anesthesiology

## 2013-09-13 ENCOUNTER — Encounter (HOSPITAL_COMMUNITY)
Admission: RE | Admit: 2013-09-13 | Discharge: 2013-09-13 | Disposition: A | Discharge status: Home / Self Care | Payer: PRIVATE HEALTH INSURANCE | Source: Ambulatory Visit | Attending: General Surgery | Admitting: General Surgery

## 2013-09-13 ENCOUNTER — Encounter (HOSPITAL_COMMUNITY): Payer: Self-pay

## 2013-09-13 HISTORY — DX: Constipation, unspecified: K59.00

## 2013-09-13 HISTORY — DX: Anemia, unspecified: D64.9

## 2013-09-13 HISTORY — DX: Adverse effect of unspecified anesthetic, initial encounter: T41.45XA

## 2013-09-13 HISTORY — DX: Other specified postprocedural states: R11.2

## 2013-09-13 HISTORY — DX: Other specified postprocedural states: Z98.890

## 2013-09-13 HISTORY — DX: Other complications of anesthesia, initial encounter: T88.59XA

## 2013-09-13 LAB — CBC
HCT: 40.5 % (ref 36.0–46.0)
Hemoglobin: 13.4 g/dL (ref 12.0–15.0)
MCH: 29.5 pg (ref 26.0–34.0)
MCHC: 33.1 g/dL (ref 30.0–36.0)
MCV: 89 fL (ref 78.0–100.0)
Platelets: 238 10*3/uL (ref 150–400)
RBC: 4.55 MIL/uL (ref 3.87–5.11)
RDW: 12.7 % (ref 11.5–15.5)
WBC: 6 10*3/uL (ref 4.0–10.5)

## 2013-09-13 LAB — BASIC METABOLIC PANEL
BUN: 13 mg/dL (ref 6–23)
CO2: 24 meq/L (ref 19–32)
Calcium: 9.6 mg/dL (ref 8.4–10.5)
Chloride: 108 mEq/L (ref 96–112)
Creatinine, Ser: 0.67 mg/dL (ref 0.50–1.10)
GFR calc Af Amer: >90 mL/min (ref >90)
GFR calc non Af Amer: >90 mL/min (ref >90)
GLUCOSE: 96 mg/dL (ref 70–99)
Potassium: 4.5 mEq/L (ref 3.7–5.3)
SODIUM: 146 meq/L (ref 137–147)

## 2013-09-13 LAB — NO BLOOD PRODUCTS

## 2013-09-13 NOTE — Progress Notes (Signed)
Anesthesia Note:  Patient is a 63 year old female scheduled for Port-a-cath insertion on 09/16/13 by Dr. Donne Hazel.  History reviewed.  She is s/p right mastectomy with reconstruction on 07/12/13.  Preoperative EKG was not done at that time.  EKG today showed NSR, left BBB.  Currently there are no comparison EKGs available in [place], [place], or at [facility].  Her PCP office is closed due to inclement weather. She does have a history of a cardiology evaluation in 2007 by Dr. Rozann Lesches for an abnormal EKG (LAFB with QRS widening, inferior anterolateral T wave inversions by 07/14/06 note but no tracing available) and had a non-ischemic stress test at that time, EF 57%.  No CV symptoms reported at her PAT visit.  She tolerated breast surgery within the past three months.  Anticipate that she can proceed as planned.  Anesthesiologist Dr. Conrad Hopatcong agrees with this plan.  [facility] Short Stay Center/Anesthesiology Phone [phone] 09/13/2013 4:51 PM

## 2013-09-13 NOTE — Pre-Procedure Instructions (Signed)
Morgan Santos  09/13/2013   Your procedure is scheduled on:  Monday, September 16, 2013 at 10:00 AM.   Report to [facility] Entrance "A" Admitting Office at 8:00 AM.   Call this number if you have problems the morning of surgery: [phone]   Remember:   Do not eat food or drink liquids after midnight Sunday, 09/15/13.   Take these medicines the morning of surgery with A SIP OF WATER: oxyCODONE (OXY IR/ROXICODONE) - if needed, ondansetron (ZOFRAN) - if needed, diazepam (VALIUM) - if needed.   Stop all Vitamins and Herbal Medications as of today.      Do not wear jewelry, make-up or nail polish.  Do not wear lotions, powders, or perfumes. You may wear deodorant.  Do not shave 48 hours prior to surgery.   Do not bring valuables to the hospital.  [facility] is not responsible                  for any belongings or valuables.               Contacts, dentures or bridgework may not be worn into surgery.  Leave suitcase in the car. After surgery it may be brought to your room.  For patients admitted to the hospital, discharge time is determined by your                treatment team.               Patients discharged the day of surgery will not be allowed to drive  home.  Name and phone number of your driver: Family/friend   Special Instructions: Shower using CHG 2 nights before surgery and the night before surgery.  If you shower the day of surgery use CHG.  Use special wash - you have one bottle of CHG for all showers.  You should use approximately 1/3 of the bottle for each shower.   Please read over the following fact sheets that you were given: Pain Booklet, Coughing and Deep Breathing and Surgical Site Infection Prevention

## 2013-09-15 MED ORDER — CEFAZOLIN SODIUM-DEXTROSE 2-3 GM-% IV SOLR
2.0000 g | INTRAVENOUS | Status: AC
  Administered 2013-09-16: 2 g via INTRAVENOUS
  Filled 2013-09-15: qty 50

## 2013-09-16 ENCOUNTER — Encounter (HOSPITAL_COMMUNITY)
Admission: RE | Disposition: A | Discharge status: Home / Self Care | Payer: Self-pay | Source: Ambulatory Visit | Attending: General Surgery

## 2013-09-16 ENCOUNTER — Encounter (HOSPITAL_COMMUNITY): Payer: Self-pay | Admitting: *Deleted

## 2013-09-16 ENCOUNTER — Ambulatory Visit (HOSPITAL_COMMUNITY): Payer: PRIVATE HEALTH INSURANCE

## 2013-09-16 ENCOUNTER — Ambulatory Visit (HOSPITAL_COMMUNITY): Payer: PRIVATE HEALTH INSURANCE | Admitting: Anesthesiology

## 2013-09-16 ENCOUNTER — Ambulatory Visit (HOSPITAL_COMMUNITY)
Admission: RE | Admit: 2013-09-16 | Discharge: 2013-09-16 | Disposition: A | Discharge status: Home / Self Care | Payer: PRIVATE HEALTH INSURANCE | Source: Ambulatory Visit | Attending: General Surgery | Admitting: General Surgery

## 2013-09-16 ENCOUNTER — Encounter (HOSPITAL_COMMUNITY): Payer: PRIVATE HEALTH INSURANCE | Admitting: Vascular Surgery

## 2013-09-16 DIAGNOSIS — F3289 Other specified depressive episodes: Secondary | ICD-10-CM | POA: Insufficient documentation

## 2013-09-16 DIAGNOSIS — Z8673 Personal history of transient ischemic attack (TIA), and cerebral infarction without residual deficits: Secondary | ICD-10-CM | POA: Insufficient documentation

## 2013-09-16 DIAGNOSIS — C50919 Malignant neoplasm of unspecified site of unspecified female breast: Secondary | ICD-10-CM | POA: Insufficient documentation

## 2013-09-16 DIAGNOSIS — F411 Generalized anxiety disorder: Secondary | ICD-10-CM | POA: Insufficient documentation

## 2013-09-16 DIAGNOSIS — I10 Essential (primary) hypertension: Secondary | ICD-10-CM | POA: Insufficient documentation

## 2013-09-16 DIAGNOSIS — K219 Gastro-esophageal reflux disease without esophagitis: Secondary | ICD-10-CM | POA: Insufficient documentation

## 2013-09-16 DIAGNOSIS — F329 Major depressive disorder, single episode, unspecified: Secondary | ICD-10-CM | POA: Insufficient documentation

## 2013-09-16 HISTORY — PX: PORTACATH PLACEMENT: SHX2246

## 2013-09-16 SURGERY — INSERTION, TUNNELED CENTRAL VENOUS DEVICE, WITH PORT
Anesthesia: General | Site: Chest

## 2013-09-16 MED ORDER — ONDANSETRON HCL 4 MG/2ML IJ SOLN: 4.0000 mg | Freq: Once | INTRAMUSCULAR | Status: DC | PRN

## 2013-09-16 MED ORDER — ONDANSETRON HCL 4 MG/2ML IJ SOLN
INTRAMUSCULAR | Status: DC | PRN
  Administered 2013-09-16 (×2): 4 mg via INTRAVENOUS

## 2013-09-16 MED ORDER — HEPARIN SOD (PORK) LOCK FLUSH 100 UNIT/ML IV SOLN
INTRAVENOUS | Status: AC
  Filled 2013-09-16: qty 5

## 2013-09-16 MED ORDER — LIDOCAINE HCL (CARDIAC) 20 MG/ML IV SOLN
INTRAVENOUS | Status: DC | PRN
  Administered 2013-09-16: 100 mg via INTRAVENOUS

## 2013-09-16 MED ORDER — PROPOFOL 10 MG/ML IV BOLUS
INTRAVENOUS | Status: AC
  Filled 2013-09-16: qty 20

## 2013-09-16 MED ORDER — 0.9 % SODIUM CHLORIDE (POUR BTL) OPTIME
TOPICAL | Status: DC | PRN
  Administered 2013-09-16: 1000 mL

## 2013-09-16 MED ORDER — OXYCODONE-ACETAMINOPHEN 10-325 MG PO TABS: 1.0000 | ORAL_TABLET | Freq: Four times a day (QID) | ORAL | Status: DC | PRN

## 2013-09-16 MED ORDER — PROPOFOL 10 MG/ML IV BOLUS
INTRAVENOUS | Status: DC | PRN
  Administered 2013-09-16: 200 mg via INTRAVENOUS

## 2013-09-16 MED ORDER — LIDOCAINE HCL (CARDIAC) 20 MG/ML IV SOLN
INTRAVENOUS | Status: AC
  Filled 2013-09-16: qty 5

## 2013-09-16 MED ORDER — LACTATED RINGERS IV SOLN
INTRAVENOUS | Status: DC
  Administered 2013-09-16: 09:00:00 via INTRAVENOUS

## 2013-09-16 MED ORDER — HEPARIN SOD (PORK) LOCK FLUSH 100 UNIT/ML IV SOLN
INTRAVENOUS | Status: DC | PRN
  Administered 2013-09-16: 500 [IU] via INTRAVENOUS

## 2013-09-16 MED ORDER — PHENYLEPHRINE 40 MCG/ML (10ML) SYRINGE FOR IV PUSH (FOR BLOOD PRESSURE SUPPORT)
PREFILLED_SYRINGE | INTRAVENOUS | Status: AC
  Filled 2013-09-16: qty 10

## 2013-09-16 MED ORDER — FENTANYL CITRATE 0.05 MG/ML IJ SOLN
INTRAMUSCULAR | Status: AC
  Filled 2013-09-16: qty 5

## 2013-09-16 MED ORDER — HYDROMORPHONE HCL PF 1 MG/ML IJ SOLN
0.2500 mg | INTRAMUSCULAR | Status: DC | PRN
Start: 2013-09-16 — End: 2013-09-16
  Administered 2013-09-16 (×2): 0.5 mg via INTRAVENOUS

## 2013-09-16 MED ORDER — MIDAZOLAM HCL 2 MG/2ML IJ SOLN
INTRAMUSCULAR | Status: AC
  Filled 2013-09-16: qty 2

## 2013-09-16 MED ORDER — BUPIVACAINE HCL (PF) 0.25 % IJ SOLN
INTRAMUSCULAR | Status: DC | PRN
  Administered 2013-09-16: 10 mL

## 2013-09-16 MED ORDER — HYDROMORPHONE HCL PF 1 MG/ML IJ SOLN
INTRAMUSCULAR | Status: AC
  Filled 2013-09-16: qty 1

## 2013-09-16 MED ORDER — SODIUM CHLORIDE 0.9 % IR SOLN
Status: DC | PRN
  Administered 2013-09-16: 10:00:00

## 2013-09-16 MED ORDER — SUCCINYLCHOLINE CHLORIDE 20 MG/ML IJ SOLN
INTRAMUSCULAR | Status: AC
  Filled 2013-09-16: qty 1

## 2013-09-16 MED ORDER — BUPIVACAINE HCL (PF) 0.25 % IJ SOLN
INTRAMUSCULAR | Status: AC
  Filled 2013-09-16: qty 30

## 2013-09-16 MED ORDER — ONDANSETRON HCL 4 MG/2ML IJ SOLN
INTRAMUSCULAR | Status: AC
  Filled 2013-09-16: qty 4

## 2013-09-16 MED ORDER — MIDAZOLAM HCL 5 MG/5ML IJ SOLN
INTRAMUSCULAR | Status: DC | PRN
  Administered 2013-09-16: 2 mg via INTRAVENOUS

## 2013-09-16 MED ORDER — FENTANYL CITRATE 0.05 MG/ML IJ SOLN
INTRAMUSCULAR | Status: DC | PRN
  Administered 2013-09-16: 100 ug via INTRAVENOUS

## 2013-09-16 SURGICAL SUPPLY — 50 items
ADH SKN CLS APL DERMABOND .7 (GAUZE/BANDAGES/DRESSINGS) ×1
BAG DECANTER FOR FLEXI CONT (MISCELLANEOUS) ×2 IMPLANT
BLADE SURG 11 STRL SS (BLADE) ×2 IMPLANT
BLADE SURG 15 STRL LF DISP TIS (BLADE) ×1 IMPLANT
BLADE SURG 15 STRL SS (BLADE) ×2
CHLORAPREP W/TINT 26ML (MISCELLANEOUS) ×2 IMPLANT
COVER SURGICAL LIGHT HANDLE (MISCELLANEOUS) ×2 IMPLANT
CRADLE DONUT ADULT HEAD (MISCELLANEOUS) ×2 IMPLANT
DECANTER SPIKE VIAL GLASS SM (MISCELLANEOUS) IMPLANT
DERMABOND ADHESIVE PROPEN (GAUZE/BANDAGES/DRESSINGS) ×1
DERMABOND ADVANCED (GAUZE/BANDAGES/DRESSINGS) ×1
DERMABOND ADVANCED .7 DNX12 (GAUZE/BANDAGES/DRESSINGS) ×1 IMPLANT
DERMABOND ADVANCED .7 DNX6 (GAUZE/BANDAGES/DRESSINGS) ×1 IMPLANT
DRAPE C-ARM 42X72 X-RAY (DRAPES) ×2 IMPLANT
DRAPE LAPAROSCOPIC ABDOMINAL (DRAPES) ×2 IMPLANT
ELECT CAUTERY BLADE 6.4 (BLADE) ×2 IMPLANT
ELECT REM PT RETURN 9FT ADLT (ELECTROSURGICAL) ×2
ELECTRODE REM PT RTRN 9FT ADLT (ELECTROSURGICAL) ×1 IMPLANT
GAUZE SPONGE 4X4 16PLY XRAY LF (GAUZE/BANDAGES/DRESSINGS) ×2 IMPLANT
GLOVE BIO SURGEON STRL SZ7 (GLOVE) ×2 IMPLANT
GLOVE BIOGEL PI IND STRL 7.5 (GLOVE) ×1 IMPLANT
GLOVE BIOGEL PI INDICATOR 7.5 (GLOVE) ×1
GOWN STRL NON-REIN LRG LVL3 (GOWN DISPOSABLE) ×4 IMPLANT
INTRODUCER COOK 11FR (CATHETERS) IMPLANT
KIT BASIN OR (CUSTOM PROCEDURE TRAY) ×2 IMPLANT
KIT PORT POWER 8FR ISP CVUE (Catheter) ×2 IMPLANT
KIT PORT POWER 9.6FR MRI PREA (Catheter) IMPLANT
KIT PORT POWER ISP 8FR (Catheter) IMPLANT
KIT POWER CATH 8FR (Catheter) ×2 IMPLANT
KIT ROOM TURNOVER OR (KITS) ×2 IMPLANT
NEEDLE HYPO 25GX1X1/2 BEV (NEEDLE) ×2 IMPLANT
NS IRRIG 1000ML POUR BTL (IV SOLUTION) ×2 IMPLANT
PACK SURGICAL SETUP 50X90 (CUSTOM PROCEDURE TRAY) ×2 IMPLANT
PAD ARMBOARD 7.5X6 YLW CONV (MISCELLANEOUS) ×4 IMPLANT
PENCIL BUTTON HOLSTER BLD 10FT (ELECTRODE) ×2 IMPLANT
SET INTRODUCER 12FR PACEMAKER (SHEATH) IMPLANT
SET SHEATH INTRODUCER 10FR (MISCELLANEOUS) IMPLANT
SHEATH COOK PEEL AWAY SET 9F (SHEATH) IMPLANT
SUT MNCRL AB 4-0 PS2 18 (SUTURE) ×2 IMPLANT
SUT PROLENE 2 0 SH 30 (SUTURE) ×2 IMPLANT
SUT SILK 2 0 (SUTURE)
SUT SILK 2-0 18XBRD TIE 12 (SUTURE) IMPLANT
SUT VIC AB 3-0 SH 27 (SUTURE) ×1
SUT VIC AB 3-0 SH 27XBRD (SUTURE) ×1 IMPLANT
SYR 20ML ECCENTRIC (SYRINGE) ×4 IMPLANT
SYR 5ML LUER SLIP (SYRINGE) ×2 IMPLANT
SYR CONTROL 10ML LL (SYRINGE) ×2 IMPLANT
TOWEL OR 17X24 6PK STRL BLUE (TOWEL DISPOSABLE) ×2 IMPLANT
TOWEL OR 17X26 10 PK STRL BLUE (TOWEL DISPOSABLE) ×2 IMPLANT
WATER STERILE IRR 1000ML POUR (IV SOLUTION) IMPLANT

## 2013-09-16 NOTE — Anesthesia Postprocedure Evaluation (Signed)
  Anesthesia Post-op Note  Patient: Morgan Santos  Procedure(s) Performed: Procedure(s): INSERTION PORT-A-CATH (N/A)  Patient Location: PACU  Anesthesia Type:General  Level of Consciousness: awake, alert , oriented and patient cooperative  Airway and Oxygen Therapy: Patient Spontanous Breathing  Post-op Pain: mild  Post-op Assessment: Post-op Vital signs reviewed, Patient's Cardiovascular Status Stable, Respiratory Function Stable, Patent Airway, No signs of Nausea or vomiting and Pain level controlled  Post-op Vital Signs: stable  Complications: No apparent anesthesia complications

## 2013-09-16 NOTE — H&P (Signed)
Morgan Santos is an 63 y.o. female.   Chief Complaint: needs access for chemotherapy HPI: 58 yof s/p right mastectomy/snbx and reconstruction who has oncotype score of 23.  She will undergo chemotherapy with Dr Morgan Santos and she has requested a port.  She is otherwise doing well today and is without complaints. She is due to begin chemo 2/5  Past Medical History  Diagnosis Date  . Breast cancer   . Anxiety   . Depression     pt. remarks that she "takes respridal because if I don't take it I get angry"   . GERD (gastroesophageal reflux disease)     pt. uses vinegar for heartburn, ususally once per week   . Arthritis     hip- R, back   . Hypertension     "when I get upset"  Not on medication  . Stroke ?2010 or 2011    2010, [facility] for stroke "mini stroke" short term memory problems since  . Anemia     when she was younger  . Constipation   . Complication of anesthesia   . PONV (postoperative nausea and vomiting)     "just for one day"    Past Surgical History  Procedure Laterality Date  . Cholecystectomy    . Breast surgery      for abcesses- removed from both breasts, many yrs. ago  . Mastectomy w/ sentinel node biopsy Right 07/12/2013    Procedure: MASTECTOMY WITH SENTINEL LYMPH NODE BIOPSY;  Surgeon: Morgan Bookbinder, MD;  Location: [facility];  Service: General;  Laterality: Right;  . Breast reconstruction with placement of tissue expander and flex hd (acellular hydrated dermis) Right 07/12/2013    Procedure: RIGHT BREAST RECONSTRUCTION WITH PLACEMENT OF TISSUE EXPANDER AND FLEX HD TO RIGHT BREAST (ACELLULAR HYDRATED DERMIS);  Surgeon: Morgan Limbo, MD;  Location: [facility];  Service: Plastics;  Laterality: Right;  . Vaginal hysterectomy    . Colonoscopy    . Mastectomy    . Tubal ligation    . Dilation and curettage of uterus      Family History  Problem Relation Age of Onset  . Hypertension Mother   . Diabetes type II Mother   . CVA Mother   . Heart disease Mother    . Alcoholism Father   . Pneumonia Father    Social History:  reports that she quit smoking about 18 years ago. She has never used smokeless tobacco. She reports that she drinks alcohol. She reports that she uses illicit drugs (Marijuana).  Allergies:  Allergies  Allergen Reactions  . Codeine Nausea And Vomiting  . Multihance [Gadobenate] Nausea Only and Cough    PT HAD INCREASED MUCOUS PRODUCTION AND PHLEGMY COUGH LASTING 10 MINS AFTER INJECTION, PT SENT HOME WITH BENADRYL, PREV NAUSEA AFTER GAD    Medications Prior to Admission  Medication Sig Dispense Refill  . ALPRAZolam (XANAX) 0.5 MG tablet Take 0.5 mg by mouth at bedtime as needed for anxiety.       Marland Kitchen aspirin 81 MG tablet Take 81 mg by mouth daily.      . Cholecalciferol (VITAMIN D-3) 5000 UNITS TABS Take 2 each by mouth daily.      . diazepam (VALIUM) 5 MG tablet Take 1 tablet (5 mg total) by mouth every 8 (eight) hours as needed for anxiety or muscle spasms.  30 tablet  0  . Flaxseed, Linseed, (FLAX SEED OIL) 1000 MG CAPS Take 2 each by mouth daily.      Marland Kitchen  risperiDONE (RISPERDAL) 0.25 MG tablet Take 1 mg by mouth at bedtime.      . simvastatin (ZOCOR) 20 MG tablet Take 20 mg by mouth every evening.      Marland Kitchen dexamethasone (DECADRON) 4 MG tablet Take 2 tablets (8 mg total) by mouth 2 (two) times daily with a meal. Take two times a day the day before Taxotere. Then take two times a day starting the day after chemo for 3 days.  30 tablet  1  . lidocaine-prilocaine (EMLA) cream Apply 1 application topically as needed.  30 g  1  . LORazepam (ATIVAN) 0.5 MG tablet Take 1 tablet (0.5 mg total) by mouth every 6 (six) hours as needed (Nausea or vomiting).  30 tablet  0  . ondansetron (ZOFRAN) 8 MG tablet Take 1 tablet (8 mg total) by mouth 2 (two) times daily. Take two times a day starting the day after chemo for 3 days. Then take two times a day as needed for nausea or vomiting.  30 tablet  1  . oxyCODONE (OXY IR/ROXICODONE) 5 MG immediate  release tablet Take 1 tablet (5 mg total) by mouth every 4 (four) hours as needed for moderate pain.  30 tablet  0  . prochlorperazine (COMPAZINE) 10 MG tablet Take 1 tablet (10 mg total) by mouth every 6 (six) hours as needed (Nausea or vomiting).  30 tablet  1    No results found for this or any previous visit (from the past 48 hour(s)). No results found.  Review of Systems  Respiratory: Negative for cough.   Cardiovascular: Negative for chest pain.    There were no vitals taken for this visit. Physical Exam  Constitutional: She appears well-developed and well-nourished.  Cardiovascular: Normal rate, regular rhythm and normal heart sounds.   Respiratory: Effort normal and breath sounds normal.     Assessment/Plan Breast cancer, needs venous access  Plan for port placement, risks/benefits discussed  Morgan Santos 09/16/2013, 8:57 AM

## 2013-09-16 NOTE — Preoperative (Signed)
Beta Blockers   Reason not to administer Beta Blockers:Not Applicable 

## 2013-09-16 NOTE — Anesthesia Preprocedure Evaluation (Signed)
Anesthesia Evaluation  Patient identified by MRN, date of birth, ID band Patient awake    Reviewed: Allergy & Precautions, H&P , NPO status , Patient's Chart, lab work & pertinent test results  History of Anesthesia Complications (+) PONV  Airway       Dental   Pulmonary former smoker,          Cardiovascular hypertension,     Neuro/Psych CVA, No Residual Symptoms    GI/Hepatic GERD-  ,(+)     substance abuse  marijuana use,   Endo/Other    Renal/GU      Musculoskeletal   Abdominal   Peds  Hematology  (+) anemia ,   Anesthesia Other Findings   Reproductive/Obstetrics                           Anesthesia Physical Anesthesia Plan  ASA: III  Anesthesia Plan: General   Post-op Pain Management:    Induction: Intravenous  Airway Management Planned: LMA  Additional Equipment:   Intra-op Plan:   Post-operative Plan: Extubation in OR  Informed Consent: I have reviewed the patients History and Physical, chart, labs and discussed the procedure including the risks, benefits and alternatives for the proposed anesthesia with the patient or authorized representative who has indicated his/her understanding and acceptance.     Plan Discussed with:   Anesthesia Plan Comments:         Anesthesia Quick Evaluation

## 2013-09-16 NOTE — Discharge Instructions (Signed)
° ° °PORT-A-CATH: POST OP INSTRUCTIONS ° °Always review your discharge instruction sheet given to you by the facility where your surgery was performed.  ° °1. A prescription for pain medication may be given to you upon discharge. Take your pain medication as prescribed, if needed. If narcotic pain medicine is not needed, then you make take acetaminophen (Tylenol) or ibuprofen (Advil) as needed.  °2. Take your usually prescribed medications unless otherwise directed. °3. If you need a refill on your pain medication, please contact our office. All narcotic pain medicine now requires a paper prescription.  Phoned in and fax refills are no longer allowed by law.  Prescriptions will not be filled after 5 pm or on weekends.  °4. You should follow a light diet for the remainder of the day after your procedure. °5. Most patients will experience some mild swelling and/or bruising in the area of the incision. It may take several days to resolve. °6. It is common to experience some constipation if taking pain medication after surgery. Increasing fluid intake and taking a stool softener (such as Colace) will usually help or prevent this problem from occurring. A mild laxative (Milk of Magnesia or Miralax) should be taken according to package directions if there are no bowel movements after 48 hours.  °7. Unless discharge instructions indicate otherwise, you may remove your bandages 48 hours after surgery, and you may shower at that time. You may have steri-strips (small white skin tapes) in place directly over the incision.  These strips should be left on the skin for 7-10 days.  If your surgeon used Dermabond (skin glue) on the incision, you may shower in 24 hours.  The glue will flake off over the next 2-3 weeks.  °8. If your port is left accessed at the end of surgery (needle left in port), the dressing cannot get wet and should only by changed by a healthcare professional. When the port is no longer accessed (when the  needle has been removed), follow step 7.   °9. ACTIVITIES:  Limit activity involving your arms for the next 72 hours. Do no strenuous exercise or activity for 1 week. You may drive when you are no longer taking prescription pain medication, you can comfortably wear a seatbelt, and you can maneuver your car. °10.You may need to see your doctor in the office for a follow-up appointment.  Please °      check with your doctor.  °11.When you receive a new Port-a-Cath, you will get a product guide and  °      ID card.  Please keep them in case you need them. ° °WHEN TO CALL YOUR DOCTOR ([phone]): °1. Fever over 101.0 °2. Chills °3. Continued bleeding from incision °4. Increased redness and tenderness at the site °5. Shortness of breath, difficulty breathing ° ° °The clinic staff is available to answer your questions during regular business hours. Please don’t hesitate to call and ask to speak to one of the nurses or medical assistants for clinical concerns. If you have a medical emergency, go to the nearest emergency room or call 911.  A surgeon from [facility] is always on call at the hospital.  ° ° ° °For further information, please visit [email or web address] ° ° ° ° ° °What to eat: ° °For your first meals, you should eat lightly; only small meals initially.  If you do not have nausea, you may eat larger meals.  Avoid spicy, greasy and heavy food.   ° °  General Anesthesia, Adult, Care After  °Refer to this sheet in the next few weeks. These instructions provide you with information on caring for yourself after your procedure. Your health care provider may also give you more specific instructions. Your treatment has been planned according to current medical practices, but problems sometimes occur. Call your health care provider if you have any problems or questions after your procedure.  °WHAT TO EXPECT AFTER THE PROCEDURE  °After the procedure, it is typical to experience:  °Sleepiness.    °Nausea and vomiting. °HOME CARE INSTRUCTIONS  °For the first 24 hours after general anesthesia:  °Have a responsible person with you.  °Do not drive a car. If you are alone, do not take public transportation.  °Do not drink alcohol.  °Do not take medicine that has not been prescribed by your health care provider.  °Do not sign important papers or make important decisions.  °You may resume a normal diet and activities as directed by your health care provider.  °Change bandages (dressings) as directed.  °If you have questions or problems that seem related to general anesthesia, call the hospital and ask for the anesthetist or anesthesiologist on call. °SEEK MEDICAL CARE IF:  °You have nausea and vomiting that continue the day after anesthesia.  °You develop a rash. °SEEK IMMEDIATE MEDICAL CARE IF:  °You have difficulty breathing.  °You have chest pain.  °You have any allergic problems. °Document Released: 11/14/2000 Document Revised: 04/10/2013 Document Reviewed: 02/21/2013  °ExitCare® Patient Information ©2014 [employer].  ° ° °

## 2013-09-16 NOTE — Progress Notes (Signed)
Patient had vomiting with a moderate amount of clear vomitus after the IV had been discontinued.  No reported nausea, patient states she does this every time with anesthesia and that she has nausea medicine at home.  Call made to Dr. Tamala Julian and patient Ok'd per anesthesia to be discharged to home.  Patient reports no further nausea, but was strongly encouraged to return to the hospital if it recurs.

## 2013-09-16 NOTE — Op Note (Signed)
Preoperative diagnosis: stage I right breast cancer s/p mastectomy/snbx, oncotype score 23 Postoperative diagnosis: same as above Procedure: Left subclavian powerport insertion Surgeon: Dr Serita Grammes Anesthesia: General with LMA EBL: minimal Drains: none Complications: none Specimens: none Sponge and needle count correct Disposition to pacu stable   Indications:  This is a 63 year old female who has a clinical stage I right breast cancer status post mastectomy and sentinel lobe biopsy with reconstruction. Her OncoTYPE SCORE IS 23. SHE IS GOING TO GET CHEMOTHERAPY SO WE DISCUSSED PORT PLACEMENT FOR VENOUS ACCESS VENOUS ACCESS DURING CHEMOTHERAPY.  PROCEDURE: AFTER INFORMED CONSENT WAS OBTAINED THE PATIENT WAS TAKEN THE OPERATING ROOM. SHE WAS GIVEN CEFAZOLIN. SEQUENTIAL COMPRESSION DEVICES WERE ON HER LEGS. SHE WAS ADMINISTERED GENERAL ANESTHESIA WITH AN LMA. HER ARMS WERE TUCKED APPROPRIATELY PADDED. AN AXILLARY ROLL WAS PLACED. HER CHEST WAS THEN PREPPED AND DRAPED IN THE STANDARD STERILE SURGICAL FASHION. A SURGICAL TIMEOUT WAS THEN PERFORMED.  I THEN INFILTRATED MARCAINE THROUGHOUT HER LEFT CHEST WALL ONTO HER CLAVICLE. I ACCESSED HER SUBCLAVIAN VEIN ON THE FIRST PASS. THE WIRE WAS PLACED AND CONFIRMED TO BE IN POSITION WITH FLUOROSCOPY. I THEN MADE A POCKET BELOW THIS OVERLYING THE PECTORALIS MUSCLE. I THEN TUNNELED THE LINE BETWEEN THE 2 SITES. I DILATED THE TRACT. I THEN INSERTED THE PEEL-AWAY SHEATH AND DILATOR UNDER DIRECT VISION AND WATCHED THIS GO INTO THE VENA CAVA. I REMOVED THE WIRE. THE LINE WAS THEN PLACED THROUGH THE PEEL-AWAY SHEATH AND THE PEEL-AWAY SHEATH WAS THEN REMOVED. WHEN I TOOK ANOTHER FLUOROSCOPIC IMAGE THE LINE WAS ACTUALLY CURLED IN PLACE. I PULLED THIS BACK AND IT STRAIGHTENED OUT SO THAT THE DISTAL TIP WAS IN THE VENA CAVA. I REPLACED THE WIRE AND ADVANCED THE LINE LITTLE BIT FARTHER SO THE TIP WAS IN THE DISTAL CAVA. I THEN REMOVED THE WIRE. I ATTACHED THE LINE TO  THE PORT. I THEN SUTURED THIS INTO POSITION WITH 2-0 PROLENE IN 2 PLACES. THIS ASPIRATED BLOOD AND FLUSHED EASILY. I THEN PACKED THIS WITH HEPARIN. I THEN closed this with 3-0 Vicryl and 4 Monocryl. Dermabond was placed over this. She tolerated this well is extubated transferred recovery stable.

## 2013-09-16 NOTE — Transfer of Care (Signed)
Immediate Anesthesia Transfer of Care Note  Patient: Morgan Santos  Procedure(s) Performed: Procedure(s): INSERTION PORT-A-CATH (N/A)  Patient Location: PACU  Anesthesia Type:General  Level of Consciousness: awake, alert  and oriented  Airway & Oxygen Therapy: Patient Spontanous Breathing and Patient connected to nasal cannula oxygen  Post-op Assessment: Report given to PACU RN and Post -op Vital signs reviewed and stable  Post vital signs: Reviewed and stable  Complications: No apparent anesthesia complications

## 2013-09-17 ENCOUNTER — Encounter (HOSPITAL_COMMUNITY): Payer: Self-pay | Admitting: General Surgery

## 2013-09-18 NOTE — Addendum Note (Signed)
Addendum created 09/18/13 1317 by Kate Sable, MD   Modules edited: Anesthesia Attestations

## 2013-09-23 ENCOUNTER — Telehealth: Payer: Self-pay | Admitting: *Deleted

## 2013-09-23 NOTE — Telephone Encounter (Signed)
Faxed PAC & office notes to Lennie Hummer at [facility] for [facility].

## 2013-09-26 ENCOUNTER — Encounter: Payer: Self-pay | Admitting: Oncology

## 2013-09-26 ENCOUNTER — Ambulatory Visit (HOSPITAL_BASED_OUTPATIENT_CLINIC_OR_DEPARTMENT_OTHER): Payer: PRIVATE HEALTH INSURANCE

## 2013-09-26 ENCOUNTER — Telehealth: Payer: Self-pay | Admitting: *Deleted

## 2013-09-26 ENCOUNTER — Ambulatory Visit (HOSPITAL_BASED_OUTPATIENT_CLINIC_OR_DEPARTMENT_OTHER): Payer: PRIVATE HEALTH INSURANCE | Admitting: Oncology

## 2013-09-26 ENCOUNTER — Other Ambulatory Visit (HOSPITAL_BASED_OUTPATIENT_CLINIC_OR_DEPARTMENT_OTHER): Payer: PRIVATE HEALTH INSURANCE

## 2013-09-26 VITALS — BP 137/73 | HR 75 | Temp 98.7°F | Resp 18 | Ht 69.0 in | Wt 241.0 lb

## 2013-09-26 VITALS — BP 128/83 | HR 56 | Temp 97.1°F | Resp 18

## 2013-09-26 DIAGNOSIS — C50411 Malignant neoplasm of upper-outer quadrant of right female breast: Secondary | ICD-10-CM

## 2013-09-26 DIAGNOSIS — C50419 Malignant neoplasm of upper-outer quadrant of unspecified female breast: Secondary | ICD-10-CM

## 2013-09-26 DIAGNOSIS — Z5111 Encounter for antineoplastic chemotherapy: Secondary | ICD-10-CM

## 2013-09-26 DIAGNOSIS — Z170 Estrogen receptor positive status [ER+]: Secondary | ICD-10-CM

## 2013-09-26 DIAGNOSIS — F411 Generalized anxiety disorder: Secondary | ICD-10-CM

## 2013-09-26 LAB — CBC WITH DIFFERENTIAL/PLATELET
BASO%: 0.1 % (ref 0.0–2.0)
Basophils Absolute: 0 10*3/uL (ref 0.0–0.1)
EOS ABS: 0 10*3/uL (ref 0.0–0.5)
EOS%: 0 % (ref 0.0–7.0)
HEMATOCRIT: 40.7 % (ref 34.8–46.6)
HEMOGLOBIN: 13.5 g/dL (ref 11.6–15.9)
LYMPH%: 11.2 % — AB (ref 14.0–49.7)
MCH: 29 pg (ref 25.1–34.0)
MCHC: 33.2 g/dL (ref 31.5–36.0)
MCV: 87.3 fL (ref 79.5–101.0)
MONO#: 0.9 10*3/uL (ref 0.1–0.9)
MONO%: 6 % (ref 0.0–14.0)
NEUT%: 82.7 % — AB (ref 38.4–76.8)
NEUTROS ABS: 12.6 10*3/uL — AB (ref 1.5–6.5)
PLATELETS: 208 10*3/uL (ref 145–400)
RBC: 4.66 10*6/uL (ref 3.70–5.45)
RDW: 13.1 % (ref 11.2–14.5)
WBC: 15.2 10*3/uL — ABNORMAL HIGH (ref 3.9–10.3)
lymph#: 1.7 10*3/uL (ref 0.9–3.3)

## 2013-09-26 LAB — COMPREHENSIVE METABOLIC PANEL (CC13)
ALBUMIN: 4 g/dL (ref 3.5–5.0)
ALT: 22 U/L (ref 0–55)
ANION GAP: 11 meq/L (ref 3–11)
AST: 20 U/L (ref 5–34)
Alkaline Phosphatase: 126 U/L (ref 40–150)
BUN: 18.7 mg/dL (ref 7.0–26.0)
CALCIUM: 10.1 mg/dL (ref 8.4–10.4)
CHLORIDE: 109 meq/L (ref 98–109)
CO2: 23 mEq/L (ref 22–29)
CREATININE: 0.7 mg/dL (ref 0.6–1.1)
Glucose: 117 mg/dl (ref 70–140)
POTASSIUM: 3.7 meq/L (ref 3.5–5.1)
Sodium: 143 mEq/L (ref 136–145)
Total Bilirubin: 0.48 mg/dL (ref 0.20–1.20)
Total Protein: 7.4 g/dL (ref 6.4–8.3)

## 2013-09-26 MED ORDER — SODIUM CHLORIDE 0.9 % IJ SOLN
10.0000 mL | INTRAMUSCULAR | Status: DC | PRN
  Administered 2013-09-26: 10 mL
  Filled 2013-09-26: qty 10

## 2013-09-26 MED ORDER — SODIUM CHLORIDE 0.9 % IV SOLN
75.0000 mg/m2 | Freq: Once | INTRAVENOUS | Status: AC
  Administered 2013-09-26: 170 mg via INTRAVENOUS
  Filled 2013-09-26: qty 17

## 2013-09-26 MED ORDER — SODIUM CHLORIDE 0.9 % IV SOLN
600.0000 mg/m2 | Freq: Once | INTRAVENOUS | Status: AC
  Administered 2013-09-26: 1400 mg via INTRAVENOUS
  Filled 2013-09-26: qty 70

## 2013-09-26 MED ORDER — DEXAMETHASONE SODIUM PHOSPHATE 20 MG/5ML IJ SOLN
20.0000 mg | Freq: Once | INTRAMUSCULAR | Status: AC
  Administered 2013-09-26: 20 mg via INTRAVENOUS

## 2013-09-26 MED ORDER — HEPARIN SOD (PORK) LOCK FLUSH 100 UNIT/ML IV SOLN
500.0000 [IU] | Freq: Once | INTRAVENOUS | Status: AC | PRN
  Administered 2013-09-26: 500 [IU]
  Filled 2013-09-26: qty 5

## 2013-09-26 MED ORDER — DEXAMETHASONE SODIUM PHOSPHATE 20 MG/5ML IJ SOLN
INTRAMUSCULAR | Status: AC
  Filled 2013-09-26: qty 5

## 2013-09-26 MED ORDER — ONDANSETRON 16 MG/50ML IVPB (CHCC)
INTRAVENOUS | Status: AC
  Filled 2013-09-26: qty 16

## 2013-09-26 MED ORDER — SODIUM CHLORIDE 0.9 % IV SOLN
Freq: Once | INTRAVENOUS | Status: AC
  Administered 2013-09-26: 13:00:00 via INTRAVENOUS

## 2013-09-26 MED ORDER — ONDANSETRON 16 MG/50ML IVPB (CHCC)
16.0000 mg | Freq: Once | INTRAVENOUS | Status: AC
  Administered 2013-09-26: 16 mg via INTRAVENOUS

## 2013-09-26 NOTE — Telephone Encounter (Signed)
appts made and printed. Pt is aware that tx will be added. i emailed MW to add the tx...td 

## 2013-09-26 NOTE — Patient Instructions (Signed)
[facility] Discharge Instructions for Patients Receiving Chemotherapy  Today you received the following chemotherapy agents Taxotere,Cytoxan To help prevent nausea and vomiting after your treatment, we encourage you to take your nausea medication as prescribed.  If you develop nausea and vomiting that is not controlled by your nausea medication, call the clinic.   BELOW ARE SYMPTOMS THAT SHOULD BE REPORTED IMMEDIATELY:  *FEVER GREATER THAN 100.5 F  *CHILLS WITH OR WITHOUT FEVER  NAUSEA AND VOMITING THAT IS NOT CONTROLLED WITH YOUR NAUSEA MEDICATION  *UNUSUAL SHORTNESS OF BREATH  *UNUSUAL BRUISING OR BLEEDING  TENDERNESS IN MOUTH AND THROAT WITH OR WITHOUT PRESENCE OF ULCERS  *URINARY PROBLEMS  *BOWEL PROBLEMS  UNUSUAL RASH Items with * indicate a potential emergency and should be followed up as soon as possible.  Feel free to call the clinic you have any questions or concerns. The clinic phone number is [phone].    

## 2013-09-26 NOTE — Telephone Encounter (Signed)
Per staff message and POF I have scheduled appts.  JMW  

## 2013-09-26 NOTE — Progress Notes (Signed)
OFFICE PROGRESS NOTE  CC**  Morgan Sleeper, PA-C [address] [place] [address] Dr. Rolm Bookbinder  Dr. Thea Silversmith  DIAGNOSIS: 63 year old female with new diagnosis of right breast cancer  STAGE:  Breast cancer of upper-outer quadrant of right female breast  Primary site: Breast (Right)  Staging method: AJCC 7th Edition  Clinical: Stage IA (T1c, N0, cM0)  Summary: Stage IA (T1c, N0, cM0)   PRIOR THERAPY: #1 Patient underwent a screening mammogram and was found to have a right breast mass laterally. An ultrasound showed a 7 mm lesion at the 9:00 position 7 cm from the nipple. She was also found to have a second medial lesion at the 9:00 position 4 cm from the nipple. She underwent a biopsy of both lesions. 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 The second lesion also revealed invasive ductal carcinoma again ER positive PR positive HER-2/neu negative with a proliferation marker Ki-67 11%. She had an MRI performed that revealed 1.3. cm postbiopsy changes. She also was noted to have area of concern from anterior to posterior enhancement. This area measured 6 cm  #2 patient is status post right mastectomy done 07/12/2013. Pathology revealed:  1. Breast, simple mastectomy, Right - INVASIVE GRADE I DUCTAL CARCINOMA, TWO FOCI MEASURING 1.2 CM AND 1.1 CM IN 1 of 4 FINAL for Morgan Santos, Morgan Santos ([id number]) Diagnosis(continued) GREATEST DIMENSION. - INTERMEDIATE GRADE DUCTAL CARCINOMA IN SITU IS PRESENT. - LYMPH/VASCULAR INVASION IS IDENTIFIED. - INVASIVE DUCTAL CARCINOMA IS EXTREMELY CLOSE (LESS THAN 0.1 CM) TO ANTERIOR MARGIN. - OTHER MARGINS ARE NEGATIVE. - SEE ONCOLOGY TEMPLATE. 2. Lymph node, sentinel, biopsy, Right - ONE BENIGN LYMPH WITH NO TUMOR SEEN (0/1). - SEE COMMENT. 3. Lymph node, sentinel, biopsy, Right -  ONE BENIGN LYMPH NODE WITH NO TUMOR SEEN (0/1). - SEE COMMENT.  #3 patient had Oncotype DX testing performed. d The patient's recurrence score is 23. Those patients who had a recurrence score of 23 had an average rate of distant recurrence of 14%. (JBK:ecj 08/20/2013) . This is in the intermediate risk category. We discussed adjuvant chemotherapy consisting of 4 cycles of Taxotere and Cytoxan   CURRENT THERAPY: Cycle 1 day 1 Taxotere Cytoxan with day 2 Neulasta  INTERVAL HISTORY: Morgan Santos 63 y.o. female returns for followup visit she is anxious to get her chemotherapy started. She had a Port-A-Cath placed. She feels well except for some anxiety. She has taken lorazepam for it and it is helping her quite a bit. She denies any headaches double vision blurring of vision no fevers chills night sweats no peripheral paresthesias no rashes no diarrhea or constipation no bleeding. Manger of the 10 point review of systems is negative  MEDICAL HISTORY: Past Medical History  Diagnosis Date  . Breast cancer   . Anxiety   . Depression     pt. remarks that she "takes respridal because if I don't take it I get angry"   . GERD (gastroesophageal reflux disease)     pt. uses vinegar for heartburn, ususally once per week   . Arthritis     hip- R, back   . Hypertension     "when I get upset"  Not on medication  . Stroke ?2010 or 2011    2010, [facility] for stroke "mini stroke" short term memory problems since  . Anemia  when she was younger  . Constipation   . Complication of anesthesia   . PONV (postoperative nausea and vomiting)     "just for one day"    ALLERGIES:  is allergic to codeine and multihance.  MEDICATIONS:  Current Outpatient Prescriptions  Medication Sig Dispense Refill  . ALPRAZolam (XANAX) 0.5 MG tablet Take 0.5 mg by mouth at bedtime as needed for anxiety.       . Cholecalciferol (VITAMIN D-3) 5000 UNITS TABS Take 2 each by mouth daily.      Marland Kitchen dexamethasone  (DECADRON) 4 MG tablet Take 2 tablets (8 mg total) by mouth 2 (two) times daily with a meal. Take two times a day the day before Taxotere. Then take two times a day starting the day after chemo for 3 days.  30 tablet  1  . diazepam (VALIUM) 5 MG tablet Take 1 tablet (5 mg total) by mouth every 8 (eight) hours as needed for anxiety or muscle spasms.  30 tablet  0  . Flaxseed, Linseed, (FLAX SEED OIL) 1000 MG CAPS Take 2 each by mouth daily.      Marland Kitchen lidocaine-prilocaine (EMLA) cream Apply 1 application topically as needed.  30 g  1  . LORazepam (ATIVAN) 0.5 MG tablet Take 1 tablet (0.5 mg total) by mouth every 6 (six) hours as needed (Nausea or vomiting).  30 tablet  0  . ondansetron (ZOFRAN) 8 MG tablet Take 1 tablet (8 mg total) by mouth 2 (two) times daily. Take two times a day starting the day after chemo for 3 days. Then take two times a day as needed for nausea or vomiting.  30 tablet  1  . oxyCODONE (OXY IR/ROXICODONE) 5 MG immediate release tablet Take 1 tablet (5 mg total) by mouth every 4 (four) hours as needed for moderate pain.  30 tablet  0  . oxyCODONE-acetaminophen (PERCOCET) 10-325 MG per tablet Take 1 tablet by mouth every 6 (six) hours as needed for pain.  20 tablet  0  . prochlorperazine (COMPAZINE) 10 MG tablet Take 1 tablet (10 mg total) by mouth every 6 (six) hours as needed (Nausea or vomiting).  30 tablet  1  . risperiDONE (RISPERDAL) 0.25 MG tablet Take 1 mg by mouth at bedtime.      . simvastatin (ZOCOR) 20 MG tablet Take 20 mg by mouth every evening.      Marland Kitchen aspirin 81 MG tablet Take 81 mg by mouth daily.       No current facility-administered medications for this visit.    SURGICAL HISTORY:  Past Surgical History  Procedure Laterality Date  . Cholecystectomy    . Breast surgery      for abcesses- removed from both breasts, many yrs. ago  . Mastectomy w/ sentinel node biopsy Right 07/12/2013    Procedure: MASTECTOMY WITH SENTINEL LYMPH NODE BIOPSY;  Surgeon: Rolm Bookbinder, MD;  Location: [facility];  Service: General;  Laterality: Right;  . Breast reconstruction with placement of tissue expander and flex hd (acellular hydrated dermis) Right 07/12/2013    Procedure: RIGHT BREAST RECONSTRUCTION WITH PLACEMENT OF TISSUE EXPANDER AND FLEX HD TO RIGHT BREAST (ACELLULAR HYDRATED DERMIS);  Surgeon: Irene Limbo, MD;  Location: [facility];  Service: Plastics;  Laterality: Right;  . Vaginal hysterectomy    . Colonoscopy    . Mastectomy    . Tubal ligation    . Dilation and curettage of uterus    . Portacath placement N/A 09/16/2013  Procedure: INSERTION PORT-A-CATH;  Surgeon: Rolm Bookbinder, MD;  Location: [facility];  Service: General;  Laterality: N/A;    REVIEW OF SYSTEMS:  Pertinent items are noted in HPI.    PHYSICAL EXAMINATION: Blood pressure 137/73, pulse 75, temperature 98.7 F (37.1 C), resp. rate 18, height 5' 9"  (1.753 m), weight 241 lb (109.317 kg). Body mass index is 35.57 kg/(m^2). ECOG PERFORMANCE STATUS: 0 - Asymptomatic   General appearance: alert, cooperative and appears stated age Lymph nodes: Cervical, supraclavicular, and axillary nodes normal. Resp: clear to auscultation bilaterally Back: symmetric, no curvature. ROM normal. No CVA tenderness. Cardio: regular rate and rhythm GI: soft, non-tender; bowel sounds normal; no masses,  no organomegaly Extremities: extremities normal, atraumatic, no cyanosis or edema Neurologic: Grossly normal   LABORATORY DATA: Lab Results  Component Value Date   WBC 15.2* 09/26/2013   HGB 13.5 09/26/2013   HCT 40.7 09/26/2013   MCV 87.3 09/26/2013   PLT 208 09/26/2013      Chemistry      Component Value Date/Time   NA 146 09/13/2013 1200   NA 145 06/19/2013 0847   K 4.5 09/13/2013 1200   K 3.8 06/19/2013 0847   CL 108 09/13/2013 1200   CO2 24 09/13/2013 1200   CO2 25 06/19/2013 0847   BUN 13 09/13/2013 1200   BUN 11.0 06/19/2013 0847   CREATININE 0.67 09/13/2013 1200   CREATININE 0.8 06/19/2013 0847       Component Value Date/Time   CALCIUM 9.6 09/13/2013 1200   CALCIUM 9.6 06/19/2013 0847   ALKPHOS 102 06/19/2013 0847   AST 19 06/19/2013 0847   ALT 15 06/19/2013 0847   BILITOT 0.73 06/19/2013 0847     ADDITIONAL INFORMATION: 1. A sample (block 1E) was sent to [facility] for Oncotype testing. The patient's recurrence score is 23. Those patients who had a recurrence score of 23 had an average rate of distant recurrence of 14%. (JBK:ecj 08/20/2013) Morgan Cutter MD Pathologist, Electronic Signature ( Signed 08/20/2013) 1. CHROMOGENIC IN-SITU HYBRIDIZATION Results: HER-2/NEU BY C[facility] - NO AMPLIFICATION OF HER-2 DETECTED. RESULT RATIO OF HER2: CEP 17 SIGNALS 1.00 AVERAGE HER2 COPY NUMBER PER CELL 2.00 REFERENCE RANGE NEGATIVE HER2/Chr17 Ratio <2.0 and Average HER2 copy number <4.0 EQUIVOCAL HER2/Chr17 Ratio <2.0 and Average HER2 copy number 4.0 and <6.0 POSITIVE HER2/Chr17 Ratio >=2.0 and/or Average HER2 copy number >=6.0 Morgan Laws MD Pathologist, Electronic Signature ( Signed 08/07/2013) FINAL DIAGNOSIS Diagnosis 1. Breast, simple mastectomy, Right - INVASIVE GRADE I DUCTAL CARCINOMA, TWO FOCI MEASURING 1.2 CM AND 1.1 CM IN 1 of 4 FINAL for Morgan Santos, Morgan Santos ([id number]) Diagnosis(continued) GREATEST DIMENSION. - INTERMEDIATE GRADE DUCTAL CARCINOMA IN SITU IS PRESENT. - LYMPH/VASCULAR INVASION IS IDENTIFIED. - INVASIVE DUCTAL CARCINOMA IS EXTREMELY CLOSE (LESS THAN 0.1 CM) TO ANTERIOR MARGIN. - OTHER MARGINS ARE NEGATIVE. - SEE ONCOLOGY TEMPLATE. 2. Lymph node, sentinel, biopsy, Right - ONE BENIGN LYMPH WITH NO TUMOR SEEN (0/1). - SEE COMMENT. 3. Lymph node, sentinel, biopsy, Right - ONE BENIGN LYMPH NODE WITH NO TUMOR SEEN (0/1). - SEE COMMENT. Microscopic Comment 1. BREAST, INVASIVE TUMOR, WITH LYMPH NODE SAMPLING Specimen, including laterality and lymph node sampling (sentinel, non-sentinel): Right partial breast with sentinel lymph node sampling. Procedure:  Right simple mastectomy with sentinel lymph node biopsies. Histologic type: Ductal carcinoma. Grade: Both tumors are morphologically similar and Grade I with the following characteristics: Tubule formation: 3. Nuclear pleomorphism: 1. Mitotic:1. Tumor size (gross measurement): 1.2 cm and 1.1 cm in greatest dimension. Margins: Invasive,  distance to closest margin: Less than 0.1 cm to anterior margin, other margins negative. In-situ, distance to closest margin: At least 0.2 cm. Lymphovascular invasion: Yes, present. Tumor focality: Multifocal, two foci. Treatment effect: Not applicable. Extent of tumor: Skin: Not involved. Nipple: Not involved. Skeletal muscle: Not received. Lymph nodes: Examined: 2 Sentinel. 0 Non-sentinel. 2 Total. Lymph nodes with metastasis: 0. Breast prognostic profile: Performed on previous case, [id number]. Breast prognostic profile performed on larger tumor mass (ribbon clip): Estrogen receptor: 99%, positive. Progesterone receptor: 99%, positive. Her 2 neu: Ratio is 0.98, not amplified. Ki-67: 11%. Breast prognostic profile performed on smaller mass: Estrogen receptor: 89%, positive. Progesterone receptor: 8%, positive. Her 2 neu by C[facility]: 1.31, ratio, not amplified. Ki-67: 11%. Non-neoplastic breast: Fibrocystic changes, fibroadenoma formation. 2 of 4 FINAL for Morgan Santos, Morgan Santos ([id number]) Microscopic Comment(continued) TNM: mpT1c, pN0, MX. Comments: An E-cadherin stain is performed on both tumors which is positive in both confirming their ductal nature. As both masses appear morphologically similar and the previous Her-2 neu by C[facility] studies were negative (not amplified) only a single Her-2 neu by C[facility] will be repeated (on the larger tumor mass) unless otherwise requested. (RH:ecj 07/15/2013) 2. - 3: As there is a single cell pattern present in the invasive ductal carcinoma in the right mastectomy specimen, a cytokeratin AE1/AE3 stain is performed  on each lymph node. The stains are negative confirming the lack of metastatic carcinoma.   RADIOGRAPHIC STUDIES:  No results found.  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 Status post mastectomy. The final pathology revealed 2 foci of disease one measuring 1.1 cm and a second 1.2 cm. Tumor was ER positive PR positive HER-2/neu negative with a proliferation marker Ki-67 11%. Sentinel node was negative. There was lymphovascular invasion identified.  #2 we discussed the pathology in detail today. We discussed Oncotype DX results as well. She understands it is in the intermediate risk category giving her a 14% risk of distant recurrence with tamoxifen only. We discussed role of adjuvant chemotherapy to further reduce her risk of distant recurrence. Recommendation is adjuvant Taxotere and Cytoxan every 3 weeks for a total of 4 cycles. We discussed side effects risks and benefits of treatment. She has consented.  #3 patient is here to start cycle 1 day 1 of Taxotere Cytoxan today. She has had her Port-A-Cath placed risks and benefits of chemotherapy have been discussed with her. She has all of her antiemetics. She has also taken the dexamethasone as prescribed.  #4 anxiety: Patient is very anxious. She does have a prescription for lorazepam. I have recommended that she take this as needed. She also gets very hyper with the steroids and I do think that the lorazepam will help her.  #5 patient will return tomorrow for Neulasta injection. She knows to take an antihistaminic such as Zyrtec or Claritin as well as Tylenol to help prevent the aches and pains that she may experience from the Neulasta.  #6 I will see the patient back in one week for followup and interim lab.    All questions were answered. The patient knows to call the clinic with any problems, questions or concerns. We can certainly see the patient much sooner if  necessary.  I spent 30 minutes counseling the patient face to face. The total time spent in the appointment was 30 minutes.    Marcy Panning, MD Medical/Oncology [facility] [phone] (beeper) [phone] (Office)  09/26/2013, 12:07  PM

## 2013-09-27 ENCOUNTER — Ambulatory Visit (HOSPITAL_BASED_OUTPATIENT_CLINIC_OR_DEPARTMENT_OTHER): Payer: PRIVATE HEALTH INSURANCE

## 2013-09-27 ENCOUNTER — Telehealth: Payer: Self-pay | Admitting: *Deleted

## 2013-09-27 VITALS — BP 138/66 | HR 62 | Temp 98.0°F

## 2013-09-27 DIAGNOSIS — C50419 Malignant neoplasm of upper-outer quadrant of unspecified female breast: Secondary | ICD-10-CM

## 2013-09-27 DIAGNOSIS — Z5189 Encounter for other specified aftercare: Secondary | ICD-10-CM

## 2013-09-27 DIAGNOSIS — C50411 Malignant neoplasm of upper-outer quadrant of right female breast: Secondary | ICD-10-CM

## 2013-09-27 MED ORDER — PEGFILGRASTIM INJECTION 6 MG/0.6ML
6.0000 mg | Freq: Once | SUBCUTANEOUS | Status: AC
  Administered 2013-09-27: 6 mg via SUBCUTANEOUS
  Filled 2013-09-27: qty 0.6

## 2013-09-27 NOTE — Telephone Encounter (Signed)
Called [facility] for chemotherapy F/U.  Patient is doing well.  Denies n/v and taking dexamethasone and ondansetron as ordered x 3 days.  Denies any new side effects or symptoms.  Bowels moving slowly and has taken a white liquid presumably M.O.M. with results today.  Bladder is functioning well.  Eating and drinking well and I instructed to drink 64 oz minimum daily or at least the day before, of and after treatment.  Denies questions at this time and encouraged to call if needed.  Reviewed how to call after hours in the case of an emergency.

## 2013-09-27 NOTE — Patient Instructions (Signed)

## 2013-09-27 NOTE — Telephone Encounter (Signed)
Message copied by Cherylynn Ridges on Fri Sep 27, 2013 12:56 PM ------      Message from: Christa See      Created: Thu Sep 26, 2013  3:06 PM      Regarding: First time chemo f/u call      Contact: [phone]       First time taxotere and cytoxan on Thursday. MD is Humphrey Rolls. Can you call and see how patient is? Thanks! Kristen  ------

## 2013-10-01 ENCOUNTER — Ambulatory Visit: Payer: BC Managed Care – PPO | Admitting: Oncology

## 2013-10-03 ENCOUNTER — Ambulatory Visit (HOSPITAL_BASED_OUTPATIENT_CLINIC_OR_DEPARTMENT_OTHER): Payer: PRIVATE HEALTH INSURANCE | Admitting: Oncology

## 2013-10-03 ENCOUNTER — Encounter: Payer: Self-pay | Admitting: Oncology

## 2013-10-03 ENCOUNTER — Other Ambulatory Visit (HOSPITAL_BASED_OUTPATIENT_CLINIC_OR_DEPARTMENT_OTHER): Payer: PRIVATE HEALTH INSURANCE

## 2013-10-03 ENCOUNTER — Ambulatory Visit: Payer: PRIVATE HEALTH INSURANCE

## 2013-10-03 VITALS — BP 125/76 | HR 91 | Temp 98.1°F | Resp 20 | Ht 69.0 in | Wt 237.4 lb

## 2013-10-03 DIAGNOSIS — C50411 Malignant neoplasm of upper-outer quadrant of right female breast: Secondary | ICD-10-CM

## 2013-10-03 DIAGNOSIS — C50419 Malignant neoplasm of upper-outer quadrant of unspecified female breast: Secondary | ICD-10-CM

## 2013-10-03 DIAGNOSIS — F411 Generalized anxiety disorder: Secondary | ICD-10-CM

## 2013-10-03 DIAGNOSIS — Z170 Estrogen receptor positive status [ER+]: Secondary | ICD-10-CM

## 2013-10-03 LAB — COMPREHENSIVE METABOLIC PANEL (CC13)
ALT: 44 U/L (ref 0–55)
ANION GAP: 9 meq/L (ref 3–11)
AST: 27 U/L (ref 5–34)
Albumin: 3.4 g/dL — ABNORMAL LOW (ref 3.5–5.0)
Alkaline Phosphatase: 129 U/L (ref 40–150)
BILIRUBIN TOTAL: 0.46 mg/dL (ref 0.20–1.20)
BUN: 10.5 mg/dL (ref 7.0–26.0)
CO2: 27 mEq/L (ref 22–29)
CREATININE: 0.8 mg/dL (ref 0.6–1.1)
Calcium: 9.4 mg/dL (ref 8.4–10.4)
Chloride: 103 mEq/L (ref 98–109)
Glucose: 163 mg/dl — ABNORMAL HIGH (ref 70–140)
Potassium: 3.7 mEq/L (ref 3.5–5.1)
SODIUM: 139 meq/L (ref 136–145)
TOTAL PROTEIN: 6.5 g/dL (ref 6.4–8.3)

## 2013-10-03 LAB — CBC WITH DIFFERENTIAL/PLATELET
BASO%: 0.3 % (ref 0.0–2.0)
Basophils Absolute: 0 10*3/uL (ref 0.0–0.1)
EOS%: 0.5 % (ref 0.0–7.0)
Eosinophils Absolute: 0 10*3/uL (ref 0.0–0.5)
HCT: 41.2 % (ref 34.8–46.6)
HGB: 13.5 g/dL (ref 11.6–15.9)
LYMPH#: 2 10*3/uL (ref 0.9–3.3)
LYMPH%: 24.2 % (ref 14.0–49.7)
MCH: 28.9 pg (ref 25.1–34.0)
MCHC: 32.9 g/dL (ref 31.5–36.0)
MCV: 87.9 fL (ref 79.5–101.0)
MONO#: 0.7 10*3/uL (ref 0.1–0.9)
MONO%: 8.3 % (ref 0.0–14.0)
NEUT#: 5.6 10*3/uL (ref 1.5–6.5)
NEUT%: 66.7 % (ref 38.4–76.8)
PLATELETS: 179 10*3/uL (ref 145–400)
RBC: 4.68 10*6/uL (ref 3.70–5.45)
RDW: 13.3 % (ref 11.2–14.5)
WBC: 8.4 10*3/uL (ref 3.9–10.3)

## 2013-10-03 MED ORDER — UNABLE TO FIND: 1.0000 | Status: DC | PRN

## 2013-10-03 NOTE — Progress Notes (Signed)
OFFICE PROGRESS NOTE  CC**  [name], PA-C [address] [place] [address] Dr. Rolm Bookbinder  Dr. Thea Silversmith  DIAGNOSIS: 63 year old female with new diagnosis of right breast cancer  STAGE:  Breast cancer of upper-outer quadrant of right female breast  Primary site: Breast (Right)  Staging method: AJCC 7th Edition  Clinical: Stage IA (T1c, N0, cM0)  Summary: Stage IA (T1c, N0, cM0)   PRIOR THERAPY: #1 Patient underwent a screening mammogram and was found to have a right breast mass laterally. An ultrasound showed a 7 mm lesion at the 9:00 position 7 cm from the nipple. She was also found to have a second medial lesion at the 9:00 position 4 cm from the nipple. She underwent a biopsy of both lesions. 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 The second lesion also revealed invasive ductal carcinoma again ER positive PR positive HER-2/neu negative with a proliferation marker Ki-67 11%. She had an MRI performed that revealed 1.3. cm postbiopsy changes. She also was noted to have area of concern from anterior to posterior enhancement. This area measured 6 cm  #2 patient is status post right mastectomy done 07/12/2013. Pathology revealed:  1. Breast, simple mastectomy, Right - INVASIVE GRADE I DUCTAL CARCINOMA, TWO FOCI MEASURING 1.2 CM AND 1.1 CM IN 1 of 4 FINAL for SHADAWN, HANAWAY ([id number]) Diagnosis(continued) GREATEST DIMENSION. - INTERMEDIATE GRADE DUCTAL CARCINOMA IN SITU IS PRESENT. - LYMPH/VASCULAR INVASION IS IDENTIFIED. - INVASIVE DUCTAL CARCINOMA IS EXTREMELY CLOSE (LESS THAN 0.1 CM) TO ANTERIOR MARGIN. - OTHER MARGINS ARE NEGATIVE. - SEE ONCOLOGY TEMPLATE. 2. Lymph node, sentinel, biopsy, Right - ONE BENIGN LYMPH WITH NO TUMOR SEEN (0/1). - SEE COMMENT. 3. Lymph node, sentinel, biopsy, Right -  ONE BENIGN LYMPH NODE WITH NO TUMOR SEEN (0/1). - SEE COMMENT.  #3 patient had Oncotype DX testing performed. d The patient's recurrence score is 23. Those patients who had a recurrence score of 23 had an average rate of distant recurrence of 14%. (JBK:ecj 08/20/2013) . This is in the intermediate risk category. We discussed adjuvant chemotherapy consisting of 4 cycles of Taxotere and Cytoxan   CURRENT THERAPY: Cycle 1 day 8 Taxotere Cytoxan with day 2 Neulasta  INTERVAL HISTORY: PAW KARSTENS 63 y.o. female returns for followup visit. Patient is now status post cycle 1 of her Taxotere Cytoxan being given adjuvantly. Clinically she tolerated it very well. She however did develop aches and pains from the Neulasta injection. In fact yesterday she developed some muscle spasms requiring her to go to the emergency room for evaluation. She was given a muscle relaxant and that has helped. She is complaining about food not tasting good. Especially fruits and vegetables. She has not had any nausea vomiting fevers chills or night sweats. She has noticed that her hair is slowly coming out. I have given her a prescription for a wig. And recommended that she go to [facility] for fitting and to find a wig that may suit her. She has not had any bleeding problems. Remainder of the 10 point review of systems is negative.  MEDICAL HISTORY: Past Medical History  Diagnosis Date  . Breast cancer   . Anxiety   . Depression     pt. remarks that she "takes respridal because if I don't take it I get angry"   . GERD (gastroesophageal  reflux disease)     pt. uses vinegar for heartburn, ususally once per week   . Arthritis     hip- R, back   . Hypertension     "when I get upset"  Not on medication  . Stroke ?2010 or 2011    2010, [facility] for stroke "mini stroke" short term memory problems since  . Anemia     when she was younger  . Constipation   . Complication of anesthesia   . PONV (postoperative  nausea and vomiting)     "just for one day"    ALLERGIES:  is allergic to codeine and multihance.  MEDICATIONS:  Current Outpatient Prescriptions  Medication Sig Dispense Refill  . ALPRAZolam (XANAX) 0.5 MG tablet Take 0.5 mg by mouth at bedtime as needed for anxiety.       Marland Kitchen aspirin 81 MG tablet Take 81 mg by mouth daily.      . Cholecalciferol (VITAMIN D-3) 5000 UNITS TABS Take 2 each by mouth daily.      Marland Kitchen dexamethasone (DECADRON) 4 MG tablet Take 2 tablets (8 mg total) by mouth 2 (two) times daily with a meal. Take two times a day the day before Taxotere. Then take two times a day starting the day after chemo for 3 days.  30 tablet  1  . diazepam (VALIUM) 5 MG tablet Take 1 tablet (5 mg total) by mouth every 8 (eight) hours as needed for anxiety or muscle spasms.  30 tablet  0  . Flaxseed, Linseed, (FLAX SEED OIL) 1000 MG CAPS Take 2 each by mouth daily.      Marland Kitchen lidocaine-prilocaine (EMLA) cream Apply 1 application topically as needed.  30 g  1  . LORazepam (ATIVAN) 0.5 MG tablet Take 1 tablet (0.5 mg total) by mouth every 6 (six) hours as needed (Nausea or vomiting).  30 tablet  0  . ondansetron (ZOFRAN) 8 MG tablet Take 1 tablet (8 mg total) by mouth 2 (two) times daily. Take two times a day starting the day after chemo for 3 days. Then take two times a day as needed for nausea or vomiting.  30 tablet  1  . oxyCODONE (OXY IR/ROXICODONE) 5 MG immediate release tablet Take 1 tablet (5 mg total) by mouth every 4 (four) hours as needed for moderate pain.  30 tablet  0  . oxyCODONE-acetaminophen (PERCOCET) 10-325 MG per tablet Take 1 tablet by mouth every 6 (six) hours as needed for pain.  20 tablet  0  . prochlorperazine (COMPAZINE) 10 MG tablet Take 1 tablet (10 mg total) by mouth every 6 (six) hours as needed (Nausea or vomiting).  30 tablet  1  . risperiDONE (RISPERDAL) 0.25 MG tablet Take 1 mg by mouth at bedtime.      . simvastatin (ZOCOR) 20 MG tablet Take 20 mg by mouth every evening.        No current facility-administered medications for this visit.    SURGICAL HISTORY:  Past Surgical History  Procedure Laterality Date  . Cholecystectomy    . Breast surgery      for abcesses- removed from both breasts, many yrs. ago  . Mastectomy w/ sentinel node biopsy Right 07/12/2013    Procedure: MASTECTOMY WITH SENTINEL LYMPH NODE BIOPSY;  Surgeon: Rolm Bookbinder, MD;  Location: [place];  Service: General;  Laterality: Right;  . Breast reconstruction with placement of tissue expander and flex hd (acellular hydrated dermis) Right 07/12/2013    Procedure: RIGHT BREAST  RECONSTRUCTION WITH PLACEMENT OF TISSUE EXPANDER AND FLEX HD TO RIGHT BREAST (ACELLULAR HYDRATED DERMIS);  Surgeon: Irene Limbo, MD;  Location: [facility];  Service: Plastics;  Laterality: Right;  . Vaginal hysterectomy    . Colonoscopy    . Mastectomy    . Tubal ligation    . Dilation and curettage of uterus    . Portacath placement N/A 09/16/2013    Procedure: INSERTION PORT-A-CATH;  Surgeon: Rolm Bookbinder, MD;  Location: [facility];  Service: General;  Laterality: N/A;    REVIEW OF SYSTEMS:  Pertinent items are noted in HPI.    PHYSICAL EXAMINATION: Blood pressure 125/76, pulse 91, temperature 98.1 F (36.7 C), temperature source Oral, resp. rate 20, height 5' 9"  (1.753 m), weight 237 lb 6.4 oz (107.684 kg). Body mass index is 35.04 kg/(m^2). ECOG PERFORMANCE STATUS: 0 - Asymptomatic   General appearance: alert, cooperative and appears stated age Lymph nodes: Cervical, supraclavicular, and axillary nodes normal. Resp: clear to auscultation bilaterally Back: symmetric, no curvature. ROM normal. No CVA tenderness. Cardio: regular rate and rhythm GI: soft, non-tender; bowel sounds normal; no masses,  no organomegaly Extremities: extremities normal, atraumatic, no cyanosis or edema Neurologic: Grossly normal Right breast: Surgical scar looks good. Expander is in place healing well. Left breast no masses or  nipple discharge.  LABORATORY DATA: Lab Results  Component Value Date   WBC 8.4 10/03/2013   HGB 13.5 10/03/2013   HCT 41.2 10/03/2013   MCV 87.9 10/03/2013   PLT 179 10/03/2013      Chemistry      Component Value Date/Time   NA 139 10/03/2013 1006   NA 146 09/13/2013 1200   K 3.7 10/03/2013 1006   K 4.5 09/13/2013 1200   CL 108 09/13/2013 1200   CO2 27 10/03/2013 1006   CO2 24 09/13/2013 1200   BUN 10.5 10/03/2013 1006   BUN 13 09/13/2013 1200   CREATININE 0.8 10/03/2013 1006   CREATININE 0.67 09/13/2013 1200      Component Value Date/Time   CALCIUM 9.4 10/03/2013 1006   CALCIUM 9.6 09/13/2013 1200   ALKPHOS 129 10/03/2013 1006   AST 27 10/03/2013 1006   ALT 44 10/03/2013 1006   BILITOT 0.46 10/03/2013 1006     ADDITIONAL INFORMATION: 1. A sample (block 1E) was sent to [facility] for Oncotype testing. The patient's recurrence score is 23. Those patients who had a recurrence score of 23 had an average rate of distant recurrence of 14%. (JBK:ecj 08/20/2013) Enid Cutter MD Pathologist, Electronic Signature ( Signed 08/20/2013) 1. CHROMOGENIC IN-SITU HYBRIDIZATION Results: HER-2/NEU BY CISH - NO AMPLIFICATION OF HER-2 DETECTED. RESULT RATIO OF HER2: CEP 17 SIGNALS 1.00 AVERAGE HER2 COPY NUMBER PER CELL 2.00 REFERENCE RANGE NEGATIVE HER2/Chr17 Ratio <2.0 and Average HER2 copy number <4.0 EQUIVOCAL HER2/Chr17 Ratio <2.0 and Average HER2 copy number 4.0 and <6.0 POSITIVE HER2/Chr17 Ratio >=2.0 and/or Average HER2 copy number >=6.0 Claudette Laws MD Pathologist, Electronic Signature ( Signed 08/07/2013) FINAL DIAGNOSIS Diagnosis 1. Breast, simple mastectomy, Right - INVASIVE GRADE I DUCTAL CARCINOMA, TWO FOCI MEASURING 1.2 CM AND 1.1 CM IN 1 of 4 FINAL for JULIAH, SCADDEN ([id number]) Diagnosis(continued) GREATEST DIMENSION. - INTERMEDIATE GRADE DUCTAL CARCINOMA IN SITU IS PRESENT. - LYMPH/VASCULAR INVASION IS IDENTIFIED. - INVASIVE DUCTAL CARCINOMA IS EXTREMELY CLOSE (LESS THAN  0.1 CM) TO ANTERIOR MARGIN. - OTHER MARGINS ARE NEGATIVE. - SEE ONCOLOGY TEMPLATE. 2. Lymph node, sentinel, biopsy, Right - ONE BENIGN LYMPH WITH NO TUMOR SEEN (0/1). - SEE COMMENT.  3. Lymph node, sentinel, biopsy, Right - ONE BENIGN LYMPH NODE WITH NO TUMOR SEEN (0/1). - SEE COMMENT. Microscopic Comment 1. BREAST, INVASIVE TUMOR, WITH LYMPH NODE SAMPLING Specimen, including laterality and lymph node sampling (sentinel, non-sentinel): Right partial breast with sentinel lymph node sampling. Procedure: Right simple mastectomy with sentinel lymph node biopsies. Histologic type: Ductal carcinoma. Grade: Both tumors are morphologically similar and Grade I with the following characteristics: Tubule formation: 3. Nuclear pleomorphism: 1. Mitotic:1. Tumor size (gross measurement): 1.2 cm and 1.1 cm in greatest dimension. Margins: Invasive, distance to closest margin: Less than 0.1 cm to anterior margin, other margins negative. In-situ, distance to closest margin: At least 0.2 cm. Lymphovascular invasion: Yes, present. Tumor focality: Multifocal, two foci. Treatment effect: Not applicable. Extent of tumor: Skin: Not involved. Nipple: Not involved. Skeletal muscle: Not received. Lymph nodes: Examined: 2 Sentinel. 0 Non-sentinel. 2 Total. Lymph nodes with metastasis: 0. Breast prognostic profile: Performed on previous case, [phone]. Breast prognostic profile performed on larger tumor mass (ribbon clip): Estrogen receptor: 99%, positive. Progesterone receptor: 99%, positive. Her 2 neu: Ratio is 0.98, not amplified. Ki-67: 11%. Breast prognostic profile performed on smaller mass: Estrogen receptor: 89%, positive. Progesterone receptor: 8%, positive. Her 2 neu by CISH: 1.31, ratio, not amplified. Ki-67: 11%. Non-neoplastic breast: Fibrocystic changes, fibroadenoma formation. 2 of 4 FINAL for KATHYA, WILZ ([id number]) Microscopic Comment(continued) TNM: mpT1c, pN0,  MX. Comments: An E-cadherin stain is performed on both tumors which is positive in both confirming their ductal nature. As both masses appear morphologically similar and the previous Her-2 neu by C[facility] studies were negative (not amplified) only a single Her-2 neu by CISH will be repeated (on the larger tumor mass) unless otherwise requested. (RH:ecj 07/15/2013) 2. - 3: As there is a single cell pattern present in the invasive ductal carcinoma in the right mastectomy specimen, a cytokeratin AE1/AE3 stain is performed on each lymph node. The stains are negative confirming the lack of metastatic carcinoma.   RADIOGRAPHIC STUDIES:  No results found.  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 Status post mastectomy. The final pathology revealed 2 foci of disease one measuring 1.1 cm and a second 1.2 cm. Tumor was ER positive PR positive HER-2/neu negative with a proliferation marker Ki-67 11%. Sentinel node was negative. There was lymphovascular invasion identified.  #2 we discussed the pathology in detail today. We discussed Oncotype DX results as well. She understands it is in the intermediate risk category giving her a 14% risk of distant recurrence with tamoxifen only. We discussed role of adjuvant chemotherapy to further reduce her risk of distant recurrence. Recommendation is adjuvant Taxotere and Cytoxan every 3 weeks for a total of 4 cycles. We discussed side effects risks and benefits of treatment. She has consented.  #3 status post cycle 1 day 1 of Taxotere Cytoxan administered on 09/26/2013. Overall she tolerated it well. Neulasta was tolerated well as well. Her blood counts look terrific today. She is now neutropenic.Marland Kitchen She did not develop any nausea or vomiting.  #4 anxiety: Patient is very anxious. She does have a prescription for lorazepam.  #5 muscle spasms: Likely due to the Neulasta injection. She will continue taking  the muscle relaxants.  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 Prescription for a wig was given to her.  #7 followup: Patient will be seen back on 10/17/2013 for cycle #2 day 1 of Taxotere/Cytoxan with day 2 Neulasta 2/27. She has a appointment  set up with Dr. Awanda Mink   All questions were answered. The patient knows to call the clinic with any problems, questions or concerns. We can certainly see the patient much sooner if necessary.  I spent 30 minutes counseling the patient face to face. The total time spent in the appointment was 30 minutes.    Marcy Panning, MD Medical/Oncology [facility] [phone] (beeper) [phone] (Office)  10/03/2013, 11:09 AM
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
## 2013-10-17 ENCOUNTER — Telehealth: Payer: Self-pay | Admitting: *Deleted

## 2013-10-17 ENCOUNTER — Ambulatory Visit: Payer: PRIVATE HEALTH INSURANCE

## 2013-10-17 ENCOUNTER — Ambulatory Visit (HOSPITAL_BASED_OUTPATIENT_CLINIC_OR_DEPARTMENT_OTHER): Payer: PRIVATE HEALTH INSURANCE | Admitting: Hematology and Oncology

## 2013-10-17 ENCOUNTER — Other Ambulatory Visit (HOSPITAL_BASED_OUTPATIENT_CLINIC_OR_DEPARTMENT_OTHER): Payer: PRIVATE HEALTH INSURANCE

## 2013-10-17 ENCOUNTER — Other Ambulatory Visit: Payer: PRIVATE HEALTH INSURANCE

## 2013-10-17 ENCOUNTER — Ambulatory Visit (HOSPITAL_BASED_OUTPATIENT_CLINIC_OR_DEPARTMENT_OTHER): Payer: PRIVATE HEALTH INSURANCE

## 2013-10-17 ENCOUNTER — Telehealth: Payer: Self-pay | Admitting: Oncology

## 2013-10-17 VITALS — BP 120/79 | HR 69 | Temp 97.6°F | Resp 18 | Ht 69.0 in | Wt 238.6 lb

## 2013-10-17 DIAGNOSIS — C50411 Malignant neoplasm of upper-outer quadrant of right female breast: Secondary | ICD-10-CM

## 2013-10-17 DIAGNOSIS — Z5111 Encounter for antineoplastic chemotherapy: Secondary | ICD-10-CM

## 2013-10-17 DIAGNOSIS — Z170 Estrogen receptor positive status [ER+]: Secondary | ICD-10-CM

## 2013-10-17 DIAGNOSIS — F411 Generalized anxiety disorder: Secondary | ICD-10-CM

## 2013-10-17 DIAGNOSIS — M62838 Other muscle spasm: Secondary | ICD-10-CM

## 2013-10-17 DIAGNOSIS — C50419 Malignant neoplasm of upper-outer quadrant of unspecified female breast: Secondary | ICD-10-CM

## 2013-10-17 DIAGNOSIS — L659 Nonscarring hair loss, unspecified: Secondary | ICD-10-CM

## 2013-10-17 DIAGNOSIS — E559 Vitamin D deficiency, unspecified: Secondary | ICD-10-CM

## 2013-10-17 LAB — CBC WITH DIFFERENTIAL/PLATELET
BASO%: 0.3 % (ref 0.0–2.0)
Basophils Absolute: 0 10*3/uL (ref 0.0–0.1)
EOS ABS: 0 10*3/uL (ref 0.0–0.5)
EOS%: 0 % (ref 0.0–7.0)
HEMATOCRIT: 37.6 % (ref 34.8–46.6)
HEMOGLOBIN: 12.5 g/dL (ref 11.6–15.9)
LYMPH%: 12.8 % — ABNORMAL LOW (ref 14.0–49.7)
MCH: 29 pg (ref 25.1–34.0)
MCHC: 33.2 g/dL (ref 31.5–36.0)
MCV: 87.6 fL (ref 79.5–101.0)
MONO#: 0.6 10*3/uL (ref 0.1–0.9)
MONO%: 6.1 % (ref 0.0–14.0)
NEUT#: 7.5 10*3/uL — ABNORMAL HIGH (ref 1.5–6.5)
NEUT%: 80.8 % — ABNORMAL HIGH (ref 38.4–76.8)
PLATELETS: 267 10*3/uL (ref 145–400)
RBC: 4.29 10*6/uL (ref 3.70–5.45)
RDW: 13.4 % (ref 11.2–14.5)
WBC: 9.3 10*3/uL (ref 3.9–10.3)
lymph#: 1.2 10*3/uL (ref 0.9–3.3)

## 2013-10-17 LAB — COMPREHENSIVE METABOLIC PANEL (CC13)
ALBUMIN: 3.8 g/dL (ref 3.5–5.0)
ALK PHOS: 103 U/L (ref 40–150)
ALT: 22 U/L (ref 0–55)
ANION GAP: 9 meq/L (ref 3–11)
AST: 19 U/L (ref 5–34)
BUN: 13.3 mg/dL (ref 7.0–26.0)
CHLORIDE: 109 meq/L (ref 98–109)
CO2: 23 meq/L (ref 22–29)
Calcium: 9.6 mg/dL (ref 8.4–10.4)
Creatinine: 0.8 mg/dL (ref 0.6–1.1)
GLUCOSE: 121 mg/dL (ref 70–140)
POTASSIUM: 3.9 meq/L (ref 3.5–5.1)
SODIUM: 141 meq/L (ref 136–145)
TOTAL PROTEIN: 6.9 g/dL (ref 6.4–8.3)
Total Bilirubin: 0.61 mg/dL (ref 0.20–1.20)

## 2013-10-17 MED ORDER — SODIUM CHLORIDE 0.9 % IV SOLN
75.0000 mg/m2 | Freq: Once | INTRAVENOUS | Status: AC
  Administered 2013-10-17: 170 mg via INTRAVENOUS
  Filled 2013-10-17: qty 17

## 2013-10-17 MED ORDER — ONDANSETRON 16 MG/50ML IVPB (CHCC)
INTRAVENOUS | Status: AC
  Filled 2013-10-17: qty 16

## 2013-10-17 MED ORDER — HEPARIN SOD (PORK) LOCK FLUSH 100 UNIT/ML IV SOLN
500.0000 [IU] | Freq: Once | INTRAVENOUS | Status: AC | PRN
  Administered 2013-10-17: 500 [IU]
  Filled 2013-10-17: qty 5

## 2013-10-17 MED ORDER — SODIUM CHLORIDE 0.9 % IV SOLN
600.0000 mg/m2 | Freq: Once | INTRAVENOUS | Status: AC
  Administered 2013-10-17: 1400 mg via INTRAVENOUS
  Filled 2013-10-17: qty 70

## 2013-10-17 MED ORDER — DEXAMETHASONE SODIUM PHOSPHATE 20 MG/5ML IJ SOLN
20.0000 mg | Freq: Once | INTRAMUSCULAR | Status: AC
  Administered 2013-10-17: 20 mg via INTRAVENOUS

## 2013-10-17 MED ORDER — DEXAMETHASONE 4 MG PO TABS: ORAL_TABLET | ORAL | Status: DC

## 2013-10-17 MED ORDER — SODIUM CHLORIDE 0.9 % IV SOLN
Freq: Once | INTRAVENOUS | Status: AC
  Administered 2013-10-17: 12:00:00 via INTRAVENOUS

## 2013-10-17 MED ORDER — SODIUM CHLORIDE 0.9 % IJ SOLN
10.0000 mL | INTRAMUSCULAR | Status: DC | PRN
  Administered 2013-10-17: 10 mL
  Filled 2013-10-17: qty 10

## 2013-10-17 MED ORDER — DEXAMETHASONE SODIUM PHOSPHATE 20 MG/5ML IJ SOLN
INTRAMUSCULAR | Status: AC
  Filled 2013-10-17: qty 5

## 2013-10-17 MED ORDER — ONDANSETRON 16 MG/50ML IVPB (CHCC)
16.0000 mg | Freq: Once | INTRAVENOUS | Status: AC
  Administered 2013-10-17: 16 mg via INTRAVENOUS

## 2013-10-17 NOTE — Patient Instructions (Signed)
[facility] Discharge Instructions for Patients Receiving Chemotherapy  Today you received the following chemotherapy agents Taxotere, Cytoxan.   To help prevent nausea and vomiting after your treatment, we encourage you to take your nausea medication as prescribed.l   If you develop nausea and vomiting that is not controlled by your nausea medication, call the clinic.   BELOW ARE SYMPTOMS THAT SHOULD BE REPORTED IMMEDIATELY:  *FEVER GREATER THAN 100.5 F  *CHILLS WITH OR WITHOUT FEVER  NAUSEA AND VOMITING THAT IS NOT CONTROLLED WITH YOUR NAUSEA MEDICATION  *UNUSUAL SHORTNESS OF BREATH  *UNUSUAL BRUISING OR BLEEDING  TENDERNESS IN MOUTH AND THROAT WITH OR WITHOUT PRESENCE OF ULCERS  *URINARY PROBLEMS  *BOWEL PROBLEMS  UNUSUAL RASH Items with * indicate a potential emergency and should be followed up as soon as possible.  Feel free to call the clinic should you have any questions or concerns. The clinic phone number is [phone].  It was my pleasure to take care of you today!  Leeanne Rio, RN

## 2013-10-17 NOTE — Telephone Encounter (Signed)
Patient called and canceled her appts for today. I have left her a message to call the office and reschedeule

## 2013-10-17 NOTE — Progress Notes (Signed)
Morgan Kingfisher, MD Hematology/Oncology  OFFICE PROGRESS NOTE  Morgan Sleeper, PA-C [address] [place] [address] Dr. Rolm Bookbinder  Dr. Thea Silversmith  DIAGNOSIS: 63 year old female with  diagnosis of right breast cancer  STAGE:  Breast cancer of upper-outer quadrant of right female breast  Primary site: Breast (Right)  Staging method: AJCC 7th Edition  Clinical: Stage IA (T1c, N0, cM0)  Summary: Stage IA (T1c, N0, cM0)   PRIOR THERAPY: #1 Patient underwent a screening mammogram and was found to have a right breast mass laterally. An ultrasound showed a 7 mm lesion at the 9:00 position 7 cm from the nipple. She was also found to have a second medial lesion at the 9:00 position 4 cm from the nipple. She underwent a biopsy of both lesions. 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 The second lesion also revealed invasive ductal carcinoma again ER positive PR positive HER-2/neu negative with a proliferation marker Ki-67 11%. She had an MRI performed that revealed 1.3. cm postbiopsy changes. She also was noted to have area of concern from anterior to posterior enhancement. This area measured 6 cm  #2 patient is status post right mastectomy done 07/12/2013. Pathology revealed:  1. Breast, simple mastectomy, Right - INVASIVE GRADE I DUCTAL CARCINOMA, TWO FOCI MEASURING 1.2 CM AND 1.1 CM IN 1 of 4 FINAL for SIERRIA, BRUNEY ([id number]) Diagnosis(continued) GREATEST DIMENSION. - INTERMEDIATE GRADE DUCTAL CARCINOMA IN SITU IS PRESENT. - LYMPH/VASCULAR INVASION IS IDENTIFIED. - INVASIVE DUCTAL CARCINOMA IS EXTREMELY CLOSE (LESS THAN 0.1 CM) TO ANTERIOR MARGIN. - OTHER MARGINS ARE NEGATIVE. - SEE ONCOLOGY TEMPLATE. 2. Lymph node, sentinel, biopsy, Right - ONE BENIGN LYMPH WITH NO TUMOR SEEN (0/1). - SEE COMMENT. 3.  Lymph node, sentinel, biopsy, Right - ONE BENIGN LYMPH NODE WITH NO TUMOR SEEN (0/1). - SEE COMMENT.  #3 patient had Oncotype DX testing performed. d The patient's recurrence score is 23. Those patients who had a recurrence score of 23 had an average rate of distant recurrence of 14%. (JBK:ecj 08/20/2013) . This is in the intermediate risk category. We discussed adjuvant chemotherapy consisting of 4 cycles of Taxotere and Cytoxan   CURRENT THERAPY: Cycle 2 day 1Taxotere Cytoxan with day 2 Neulasta  INTERVAL HISTORY: Morgan Santos 63 y.o. female returns for followup visit. Patient is now status post cycle 1 of her Taxotere Cytoxan being given adjuvantly. Clinically she tolerated it very well. She however did develop aches and pains from the Neulasta injection. She developed some muscle spasms requiring her to go to the emergency room for evaluation. She was given a muscle relaxant and that has helped. She has not had any nausea vomiting fevers chills or night sweats. Her hair has come out and now she is wearing a wig. She has not had any bleeding problems. She uses teatree oil for her nails and had some mild changes noted. Remainder of the 10 point review of systems is negative.  MEDICAL HISTORY: Past Medical History  Diagnosis Date  . Breast cancer   . Anxiety   . Depression     pt. remarks that she "takes respridal because if I don't take it I get angry"   . GERD (gastroesophageal reflux disease)     pt. uses vinegar for heartburn, ususally once per week   . Arthritis  hip- R, back   . Hypertension     "when I get upset"  Not on medication  . Stroke ?2010 or 2011    2010, [facility] for stroke "mini stroke" short term memory problems since  . Anemia     when she was younger  . Constipation   . Complication of anesthesia   . PONV (postoperative nausea and vomiting)     "just for one day"    ALLERGIES:  is allergic to codeine and multihance.  MEDICATIONS:  Current  Outpatient Prescriptions  Medication Sig Dispense Refill  . ALPRAZolam (XANAX) 0.5 MG tablet Take 0.5 mg by mouth at bedtime as needed for anxiety.       Marland Kitchen aspirin 81 MG tablet Take 81 mg by mouth daily.      . Cholecalciferol (VITAMIN D-3) 5000 UNITS TABS Take 2 each by mouth daily.      Marland Kitchen dexamethasone (DECADRON) 4 MG tablet Take 2 tablets (8 mg total) by mouth 2 (two) times daily with a meal. Take two times a day the day before Taxotere. Then take two times a day starting the day after chemo for 3 days.  30 tablet  1  . diazepam (VALIUM) 5 MG tablet Take 1 tablet (5 mg total) by mouth every 8 (eight) hours as needed for anxiety or muscle spasms.  30 tablet  0  . Flaxseed, Linseed, (FLAX SEED OIL) 1000 MG CAPS Take 2 each by mouth daily.      Marland Kitchen lidocaine-prilocaine (EMLA) cream Apply 1 application topically as needed.  30 g  1  . LORazepam (ATIVAN) 0.5 MG tablet Take 1 tablet (0.5 mg total) by mouth every 6 (six) hours as needed (Nausea or vomiting).  30 tablet  0  . ondansetron (ZOFRAN) 8 MG tablet Take 1 tablet (8 mg total) by mouth 2 (two) times daily. Take two times a day starting the day after chemo for 3 days. Then take two times a day as needed for nausea or vomiting.  30 tablet  1  . prochlorperazine (COMPAZINE) 10 MG tablet Take 1 tablet (10 mg total) by mouth every 6 (six) hours as needed (Nausea or vomiting).  30 tablet  1  . risperiDONE (RISPERDAL) 0.25 MG tablet Take 1 mg by mouth at bedtime.      . simvastatin (ZOCOR) 20 MG tablet Take 20 mg by mouth every evening.      Marland Kitchen UNABLE TO FIND Apply 1 applicator topically as needed. 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 dexamethasone (DECADRON) 4 MG tablet Take 2 tabl twice daily the day prior to chemo and for 3 days following chemotherapy  34 tablet  0  . oxyCODONE (OXY IR/ROXICODONE) 5 MG immediate release tablet Take 1 tablet (5 mg total) by mouth every 4 (four) hours as needed for moderate  pain.  30 tablet  0  . oxyCODONE-acetaminophen (PERCOCET) 10-325 MG per tablet Take 1 tablet by mouth every 6 (six) hours as needed for pain.  20 tablet  0   No current facility-administered medications for this visit.   Facility-Administered Medications Ordered in Other Visits  Medication Dose Route Frequency Provider Last Rate Last Dose  . sodium chloride 0.9 % injection 10 mL  10 mL Intracatheter PRN Deatra Robinson, MD   10 mL at 10/17/13 1445    SURGICAL HISTORY:  Past Surgical History  Procedure Laterality Date  . Cholecystectomy    . Breast  surgery      for abcesses- removed from both breasts, many yrs. ago  . Mastectomy w/ sentinel node biopsy Right 07/12/2013    Procedure: MASTECTOMY WITH SENTINEL LYMPH NODE BIOPSY;  Surgeon: Rolm Bookbinder, MD;  Location: [facility];  Service: General;  Laterality: Right;  . Breast reconstruction with placement of tissue expander and flex hd (acellular hydrated dermis) Right 07/12/2013    Procedure: RIGHT BREAST RECONSTRUCTION WITH PLACEMENT OF TISSUE EXPANDER AND FLEX HD TO RIGHT BREAST (ACELLULAR HYDRATED DERMIS);  Surgeon: Irene Limbo, MD;  Location: [facility];  Service: Plastics;  Laterality: Right;  . Vaginal hysterectomy    . Colonoscopy    . Mastectomy    . Tubal ligation    . Dilation and curettage of uterus    . Portacath placement N/A 09/16/2013    Procedure: INSERTION PORT-A-CATH;  Surgeon: Rolm Bookbinder, MD;  Location: [facility];  Service: General;  Laterality: N/A;    REVIEW OF SYSTEMS:  Pertinent items are noted in HPI. Nursing notes reviewd and agree.   PHYSICAL EXAMINATION: Blood pressure 120/79, pulse 69, temperature 97.6 F (36.4 C), temperature source Oral, resp. rate 18, height _0  (1.753 m), weight 238 lb 9.6 oz (108.228 kg). Body mass index is 35.22 kg/(m^2). ECOG PERFORMANCE STATUS: 0 - Asymptomatic   General appearance: alert, cooperative and appears stated age Lymph nodes: Cervical, supraclavicular, and axillary  nodes normal. Resp: clear to auscultation bilaterally Back: symmetric, no curvature. ROM normal. No CVA tenderness. Cardio: regular rate and rhythm GI: soft, non-tender; bowel sounds normal; no masses,  no organomegaly Extremities: extremities normal, atraumatic, no cyanosis or edema Neurologic: Grossly normal Right breast: Surgical scar looks good. Expander is in place healing well. Left breast no masses or nipple discharge.No localized chest wall  Pain.  LABORATORY DATA: Lab Results  Component Value Date   WBC 9.3 10/17/2013   HGB 12.5 10/17/2013   HCT 37.6 10/17/2013   MCV 87.6 10/17/2013   PLT 267 10/17/2013      Chemistry      Component Value Date/Time   NA 141 10/17/2013 1105   NA 146 09/13/2013 1200   K 3.9 10/17/2013 1105   K 4.5 09/13/2013 1200   CL 108 09/13/2013 1200   CO2 23 10/17/2013 1105   CO2 24 09/13/2013 1200   BUN 13.3 10/17/2013 1105   BUN 13 09/13/2013 1200   CREATININE 0.8 10/17/2013 1105   CREATININE 0.67 09/13/2013 1200      Component Value Date/Time   CALCIUM 9.6 10/17/2013 1105   CALCIUM 9.6 09/13/2013 1200   ALKPHOS 103 10/17/2013 1105   AST 19 10/17/2013 1105   ALT 22 10/17/2013 1105   BILITOT 0.61 10/17/2013 1105     ADDITIONAL INFORMATION: 1. A sample (block 1E) was sent to [facility] for Oncotype testing. The patient's recurrence score is 23. Those patients who had a recurrence score of 23 had an average rate of distant recurrence of 14%. (JBK:ecj 08/20/2013) Enid Cutter MD Pathologist, Electronic Signature ( Signed 08/20/2013) 1. CHROMOGENIC IN-SITU HYBRIDIZATION Results: HER-2/NEU BY [facility] - NO AMPLIFICATION OF HER-2 DETECTED. RESULT RATIO OF HER2: CEP 17 SIGNALS 1.00 AVERAGE HER2 COPY NUMBER PER CELL 2.00 REFERENCE RANGE NEGATIVE HER2/Chr17 Ratio <2.0 and Average HER2 copy number <4.0 EQUIVOCAL HER2/Chr17 Ratio <2.0 and Average HER2 copy number 4.0 and <6.0 POSITIVE HER2/Chr17 Ratio >=2.0 and/or Average HER2 copy number >=6.0 Claudette Laws  MD Pathologist, Electronic Signature ( Signed 08/07/2013) FINAL DIAGNOSIS Diagnosis 1. Breast, simple mastectomy, Right -  INVASIVE GRADE I DUCTAL CARCINOMA, TWO FOCI MEASURING 1.2 CM AND 1.1 CM IN 1 of 4 FINAL for ADDELYNN, BATTE ([id number]) Diagnosis(continued) GREATEST DIMENSION. - INTERMEDIATE GRADE DUCTAL CARCINOMA IN SITU IS PRESENT. - LYMPH/VASCULAR INVASION IS IDENTIFIED. - INVASIVE DUCTAL CARCINOMA IS EXTREMELY CLOSE (LESS THAN 0.1 CM) TO ANTERIOR MARGIN. - OTHER MARGINS ARE NEGATIVE. - SEE ONCOLOGY TEMPLATE. 2. Lymph node, sentinel, biopsy, Right - ONE BENIGN LYMPH WITH NO TUMOR SEEN (0/1). - SEE COMMENT. 3. Lymph node, sentinel, biopsy, Right - ONE BENIGN LYMPH NODE WITH NO TUMOR SEEN (0/1). - SEE COMMENT. Microscopic Comment 1. BREAST, INVASIVE TUMOR, WITH LYMPH NODE SAMPLING Specimen, including laterality and lymph node sampling (sentinel, non-sentinel): Right partial breast with sentinel lymph node sampling. Procedure: Right simple mastectomy with sentinel lymph node biopsies. Histologic type: Ductal carcinoma. Grade: Both tumors are morphologically similar and Grade I with the following characteristics: Tubule formation: 3. Nuclear pleomorphism: 1. Mitotic:1. Tumor size (gross measurement): 1.2 cm and 1.1 cm in greatest dimension. Margins: Invasive, distance to closest margin: Less than 0.1 cm to anterior margin, other margins negative. In-situ, distance to closest margin: At least 0.2 cm. Lymphovascular invasion: Yes, present. Tumor focality: Multifocal, two foci. Treatment effect: Not applicable. Extent of tumor: Skin: Not involved. Nipple: Not involved. Skeletal muscle: Not received. Lymph nodes: Examined: 2 Sentinel. 0 Non-sentinel. 2 Total. Lymph nodes with metastasis: 0. Breast prognostic profile: Performed on previous case, [id number]. Breast prognostic profile performed on larger tumor mass (ribbon clip): Estrogen receptor: 99%,  positive. Progesterone receptor: 99%, positive. Her 2 neu: Ratio is 0.98, not amplified. Ki-67: 11%. Breast prognostic profile performed on smaller mass: Estrogen receptor: 89%, positive. Progesterone receptor: 8%, positive. Her 2 neu by [facility]: 1.31, ratio, not amplified. Ki-67: 11%. Non-neoplastic breast: Fibrocystic changes, fibroadenoma formation. 2 of 4 FINAL for CHARNA, NEEB ([id number]) Microscopic Comment(continued) TNM: mpT1c, pN0, MX. Comments: An E-cadherin stain is performed on both tumors which is positive in both confirming their ductal nature. As both masses appear morphologically similar and the previous Her-2 neu by [facility] studies were negative (not amplified) only a single Her-2 neu by [facility] will be repeated (on the larger tumor mass) unless otherwise requested. (RH:ecj 07/15/2013) 2. - 3: As there is a single cell pattern present in the invasive ductal carcinoma in the right mastectomy specimen, a cytokeratin AE1/AE3 stain is performed on each lymph node. The stains are negative confirming the lack of metastatic carcinoma.   RADIOGRAPHIC STUDIES:  No results found.  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 Status post mastectomy. The final pathology revealed 2 foci of disease one measuring 1.1 cm and a second 1.2 cm. Tumor was ER positive PR positive HER-2/neu negative with a proliferation marker Ki-67 11%. Sentinel node was negative. There was lymphovascular invasion identified.  #2 we discussed the pathology in detail today. We discussed Oncotype DX results as well. She understands it is in the intermediate risk category giving her a 14% risk of distant recurrence with tamoxifen only. We discussed role of adjuvant chemotherapy to further reduce her risk of distant recurrence. Recommendation is adjuvant Taxotere and Cytoxan every 3 weeks for a total of 4 cycles. We discussed side effects risks and benefits  of treatment. She has consented.  #3 status post cycle 1 day 1 of Taxotere Cytoxan administered on 09/26/2013. Overall she tolerated it well. Neulasta was tolerated well as well.  She did not develop any nausea or vomiting.  #  4 anxiety: Patient is very anxious. She does have a prescription for lorazepam.  #5 muscle spasms: Likely due to the Neulasta injection. She will continue taking the muscle relaxants.  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 Wig  Present.  PLAN  Cycle #2 day 1 of Taxotere/Cytoxan today 10/17/2013 with day 2 Neulasta 2/27.  Patient to take her Decadron 4 mg 2 tabl twice daily the day before and for 3 days the day after each chemotherapy.Given prescription for decadron today to last for the completion of all adjuvant chemotherapy. She takes vit D 5000  2 tabl daily  Added Vit D level with next lab. Follow up in 3 weeks for cycle 3 TC.Will review labs.   All questions were answered. The patient knows to call the clinic with any problems, questions or concerns. We can certainly see the patient much sooner if necessary.  I spent 30 minutes counseling the patient face to face. The total time spent in the appointment was 30 minutes.    Morgan Kingfisher MD Oncology/Hematology [facility]    10/17/2013, 3:26 PM            CBC

## 2013-10-17 NOTE — Telephone Encounter (Signed)
pt came today and r/s all appts

## 2013-10-18 ENCOUNTER — Ambulatory Visit (HOSPITAL_BASED_OUTPATIENT_CLINIC_OR_DEPARTMENT_OTHER): Payer: PRIVATE HEALTH INSURANCE

## 2013-10-18 VITALS — BP 128/72 | HR 56 | Temp 98.4°F

## 2013-10-18 DIAGNOSIS — C50419 Malignant neoplasm of upper-outer quadrant of unspecified female breast: Secondary | ICD-10-CM

## 2013-10-18 DIAGNOSIS — Z5189 Encounter for other specified aftercare: Secondary | ICD-10-CM

## 2013-10-18 DIAGNOSIS — C50411 Malignant neoplasm of upper-outer quadrant of right female breast: Secondary | ICD-10-CM

## 2013-10-18 MED ORDER — PEGFILGRASTIM INJECTION 6 MG/0.6ML
6.0000 mg | Freq: Once | SUBCUTANEOUS | Status: AC
  Administered 2013-10-18: 6 mg via SUBCUTANEOUS
  Filled 2013-10-18: qty 0.6

## 2013-10-24 ENCOUNTER — Telehealth: Payer: Self-pay | Admitting: *Deleted

## 2013-10-24 ENCOUNTER — Encounter: Payer: Self-pay | Admitting: Oncology

## 2013-10-24 ENCOUNTER — Other Ambulatory Visit (HOSPITAL_BASED_OUTPATIENT_CLINIC_OR_DEPARTMENT_OTHER): Payer: PRIVATE HEALTH INSURANCE

## 2013-10-24 ENCOUNTER — Ambulatory Visit (HOSPITAL_BASED_OUTPATIENT_CLINIC_OR_DEPARTMENT_OTHER): Payer: PRIVATE HEALTH INSURANCE | Admitting: Oncology

## 2013-10-24 ENCOUNTER — Telehealth: Payer: Self-pay | Admitting: Oncology

## 2013-10-24 ENCOUNTER — Ambulatory Visit: Payer: PRIVATE HEALTH INSURANCE

## 2013-10-24 VITALS — BP 111/73 | HR 92 | Temp 98.4°F | Resp 18 | Ht 69.0 in | Wt 230.3 lb

## 2013-10-24 DIAGNOSIS — F411 Generalized anxiety disorder: Secondary | ICD-10-CM

## 2013-10-24 DIAGNOSIS — C50411 Malignant neoplasm of upper-outer quadrant of right female breast: Secondary | ICD-10-CM

## 2013-10-24 DIAGNOSIS — C50419 Malignant neoplasm of upper-outer quadrant of unspecified female breast: Secondary | ICD-10-CM

## 2013-10-24 DIAGNOSIS — Z170 Estrogen receptor positive status [ER+]: Secondary | ICD-10-CM

## 2013-10-24 LAB — CBC WITH DIFFERENTIAL/PLATELET
BASO%: 0.8 % (ref 0.0–2.0)
BASOS ABS: 0.1 10*3/uL (ref 0.0–0.1)
EOS%: 0.7 % (ref 0.0–7.0)
Eosinophils Absolute: 0.1 10*3/uL (ref 0.0–0.5)
HEMATOCRIT: 39.6 % (ref 34.8–46.6)
HEMOGLOBIN: 12.8 g/dL (ref 11.6–15.9)
LYMPH#: 1.8 10*3/uL (ref 0.9–3.3)
LYMPH%: 14.6 % (ref 14.0–49.7)
MCH: 28.4 pg (ref 25.1–34.0)
MCHC: 32.3 g/dL (ref 31.5–36.0)
MCV: 88.1 fL (ref 79.5–101.0)
MONO#: 1.4 10*3/uL — AB (ref 0.1–0.9)
MONO%: 11.2 % (ref 0.0–14.0)
NEUT#: 8.9 10*3/uL — ABNORMAL HIGH (ref 1.5–6.5)
NEUT%: 72.7 % (ref 38.4–76.8)
Platelets: 217 10*3/uL (ref 145–400)
RBC: 4.5 10*6/uL (ref 3.70–5.45)
RDW: 14.3 % (ref 11.2–14.5)
WBC: 12.2 10*3/uL — AB (ref 3.9–10.3)

## 2013-10-24 LAB — COMPREHENSIVE METABOLIC PANEL (CC13)
ALBUMIN: 3.4 g/dL — AB (ref 3.5–5.0)
ALT: 34 U/L (ref 0–55)
ANION GAP: 9 meq/L (ref 3–11)
AST: 21 U/L (ref 5–34)
Alkaline Phosphatase: 123 U/L (ref 40–150)
BUN: 8.9 mg/dL (ref 7.0–26.0)
CO2: 27 meq/L (ref 22–29)
CREATININE: 0.8 mg/dL (ref 0.6–1.1)
Calcium: 9.4 mg/dL (ref 8.4–10.4)
Chloride: 106 mEq/L (ref 98–109)
Glucose: 130 mg/dl (ref 70–140)
POTASSIUM: 3.6 meq/L (ref 3.5–5.1)
Sodium: 141 mEq/L (ref 136–145)
Total Bilirubin: 0.43 mg/dL (ref 0.20–1.20)
Total Protein: 6.3 g/dL — ABNORMAL LOW (ref 6.4–8.3)

## 2013-10-24 NOTE — Telephone Encounter (Signed)
Per staff message and POF I have scheduled appts.  JMW  

## 2013-10-24 NOTE — Telephone Encounter (Signed)
, °

## 2013-10-24 NOTE — Patient Instructions (Signed)
You're doing well. 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  We will see you back on 11/07/2013 for cycle #3 of Taxotere and Cytoxan.  Please call with any problems questions or concerns

## 2013-10-24 NOTE — Progress Notes (Signed)
OFFICE PROGRESS NOTE  Morgan Sleeper, PA-C [address] [place] [address] Dr. Rolm Bookbinder  Dr. Thea Silversmith  DIAGNOSIS: 63 year old female with  diagnosis of right breast cancer  STAGE:  Breast cancer of upper-outer quadrant of right female breast  Primary site: Breast (Right)  Staging method: AJCC 7th Edition  Clinical: Stage IA (T1c, N0, cM0)  Summary: Stage IA (T1c, N0, cM0)   PRIOR THERAPY: #1 Patient underwent a screening mammogram and was found to have a right breast mass laterally. An ultrasound showed a 7 mm lesion at the 9:00 position 7 cm from the nipple. She was also found to have a second medial lesion at the 9:00 position 4 cm from the nipple. She underwent a biopsy of both lesions. 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 The second lesion also revealed invasive ductal carcinoma again ER positive PR positive HER-2/neu negative with a proliferation marker Ki-67 11%. She had an MRI performed that revealed 1.3. cm postbiopsy changes. She also was noted to have area of concern from anterior to posterior enhancement. This area measured 6 cm  #2 patient is status post right mastectomy done 07/12/2013. Pathology revealed:  1. Breast, simple mastectomy, Right - INVASIVE GRADE I DUCTAL CARCINOMA, TWO FOCI MEASURING 1.2 CM AND 1.1 CM IN 1 of 4 FINAL for Morgan Santos, Morgan Santos ([id number]) Diagnosis(continued) GREATEST DIMENSION. - INTERMEDIATE GRADE DUCTAL CARCINOMA IN SITU IS PRESENT. - LYMPH/VASCULAR INVASION IS IDENTIFIED. - INVASIVE DUCTAL CARCINOMA IS EXTREMELY CLOSE (LESS THAN 0.1 CM) TO ANTERIOR MARGIN. - OTHER MARGINS ARE NEGATIVE. - SEE ONCOLOGY TEMPLATE. 2. Lymph node, sentinel, biopsy, Right - ONE BENIGN LYMPH WITH NO TUMOR SEEN (0/1). - SEE COMMENT. 3. Lymph node, sentinel, biopsy, Right - ONE  BENIGN LYMPH NODE WITH NO TUMOR SEEN (0/1). - SEE COMMENT.  #3 patient had Oncotype DX testing performed. d The patient's recurrence score is 23. Those patients who had a recurrence score of 23 had an average rate of distant recurrence of 14%. (JBK:ecj 08/20/2013) . This is in the intermediate risk category. We discussed adjuvant chemotherapy consisting of 4 cycles of Taxotere and Cytoxan   CURRENT THERAPY: Cycle 2 day 8Taxotere Cytoxan with day 2 Neulasta  INTERVAL HISTORY: Morgan Santos 63 y.o. female returns for followup visit. Patient is now status post cycle 2 of her Taxotere Cytoxan being given adjuvantly. Clinically she tolerated it very well. She however did develop aches and pains from the Neulasta injection.  She has not had any nausea vomiting fevers chills or night sweats. Her hair has come out and now she is wearing a wig. She has not had any bleeding problems. She uses teatree oil for her nails and had some mild changes noted. Remainder of the 10 point review of systems is negative.  MEDICAL HISTORY: Past Medical History  Diagnosis Date  . Breast cancer   . Anxiety   . Depression     pt. remarks that she "takes respridal because if I don't take it I get angry"   . GERD (gastroesophageal reflux disease)     pt. uses vinegar for heartburn, ususally once per week   . Arthritis     hip- R, back   . Hypertension     "when I get upset"  Not on medication  . Stroke ?2010 or 2011    2010,  [facility] for stroke "mini stroke" short term memory problems since  . Anemia     when she was younger  . Constipation   . Complication of anesthesia   . PONV (postoperative nausea and vomiting)     "just for one day"    ALLERGIES:  is allergic to codeine and multihance.  MEDICATIONS:  Current Outpatient Prescriptions  Medication Sig Dispense Refill  . ALPRAZolam (XANAX) 0.5 MG tablet Take 0.5 mg by mouth at bedtime as needed for anxiety.       Marland Kitchen aspirin 81 MG tablet Take 81 mg  by mouth daily.      . Cholecalciferol (VITAMIN D-3) 5000 UNITS TABS Take 2 each by mouth daily.      Marland Kitchen dexamethasone (DECADRON) 4 MG tablet Take 2 tablets (8 mg total) by mouth 2 (two) times daily with a meal. Take two times a day the day before Taxotere. Then take two times a day starting the day after chemo for 3 days.  30 tablet  1  . dexamethasone (DECADRON) 4 MG tablet Take 2 tabl twice daily the day prior to chemo and for 3 days following chemotherapy  34 tablet  0  . diazepam (VALIUM) 5 MG tablet Take 1 tablet (5 mg total) by mouth every 8 (eight) hours as needed for anxiety or muscle spasms.  30 tablet  0  . Flaxseed, Linseed, (FLAX SEED OIL) 1000 MG CAPS Take 2 each by mouth daily.      Marland Kitchen lidocaine-prilocaine (EMLA) cream Apply 1 application topically as needed.  30 g  1  . LORazepam (ATIVAN) 0.5 MG tablet Take 1 tablet (0.5 mg total) by mouth every 6 (six) hours as needed (Nausea or vomiting).  30 tablet  0  . ondansetron (ZOFRAN) 8 MG tablet Take 1 tablet (8 mg total) by mouth 2 (two) times daily. Take two times a day starting the day after chemo for 3 days. Then take two times a day as needed for nausea or vomiting.  30 tablet  1  . oxyCODONE (OXY IR/ROXICODONE) 5 MG immediate release tablet Take 1 tablet (5 mg total) by mouth every 4 (four) hours as needed for moderate pain.  30 tablet  0  . oxyCODONE-acetaminophen (PERCOCET) 10-325 MG per tablet Take 1 tablet by mouth every 6 (six) hours as needed for pain.  20 tablet  0  . prochlorperazine (COMPAZINE) 10 MG tablet Take 1 tablet (10 mg total) by mouth every 6 (six) hours as needed (Nausea or vomiting).  30 tablet  1  . risperiDONE (RISPERDAL) 0.25 MG tablet Take 1 mg by mouth at bedtime.      . simvastatin (ZOCOR) 20 MG tablet Take 20 mg by mouth every evening.      Marland Kitchen UNABLE TO FIND Apply 1 applicator topically as needed. 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    SURGICAL HISTORY:  Past Surgical History  Procedure Laterality Date  . Cholecystectomy    . Breast surgery      for abcesses- removed from both breasts, many yrs. ago  . Mastectomy w/ sentinel node biopsy Right 07/12/2013    Procedure: MASTECTOMY WITH SENTINEL LYMPH NODE BIOPSY;  Surgeon: Rolm Bookbinder, Santos;  Location: [place];  Service: General;  Laterality: Right;  . Breast reconstruction with placement of tissue expander and flex hd (acellular hydrated dermis) Right 07/12/2013    Procedure: RIGHT BREAST  RECONSTRUCTION WITH PLACEMENT OF TISSUE EXPANDER AND FLEX HD TO RIGHT BREAST (ACELLULAR HYDRATED DERMIS);  Surgeon: Morgan Limbo, Santos;  Location: [place];  Service: Plastics;  Laterality: Right;  . Vaginal hysterectomy    . Colonoscopy    . Mastectomy    . Tubal ligation    . Dilation and curettage of uterus    . Portacath placement N/A 09/16/2013    Procedure: INSERTION PORT-A-CATH;  Surgeon: Rolm Bookbinder, Santos;  Location: [place];  Service: General;  Laterality: N/A;    REVIEW OF SYSTEMS:  Pertinent items are noted in HPI. Nursing notes reviewd and agree.   PHYSICAL EXAMINATION: Blood pressure 111/73, pulse 92, temperature 98.4 F (36.9 C), temperature source Oral, resp. rate 18, height 5' 9"  (1.753 m), weight 230 lb 4.8 oz (104.463 kg). Body mass index is 33.99 kg/(m^2). ECOG PERFORMANCE STATUS: 0 - Asymptomatic   General appearance: alert, cooperative and appears stated age Lymph nodes: Cervical, supraclavicular, and axillary nodes normal. Resp: clear to auscultation bilaterally Back: symmetric, no curvature. ROM normal. No CVA tenderness. Cardio: regular rate and rhythm GI: soft, non-tender; bowel sounds normal; no masses,  no organomegaly Extremities: extremities normal, atraumatic, no cyanosis or edema Neurologic: Grossly normal Right breast: Surgical scar looks good. Expander is in place healing well. Left breast no masses or nipple  discharge.No localized chest wall  Pain.  LABORATORY DATA: Lab Results  Component Value Date   WBC 12.2* 10/24/2013   HGB 12.8 10/24/2013   HCT 39.6 10/24/2013   MCV 88.1 10/24/2013   PLT 217 10/24/2013      Chemistry      Component Value Date/Time   NA 141 10/24/2013 0928   NA 146 09/13/2013 1200   K 3.6 10/24/2013 0928   K 4.5 09/13/2013 1200   CL 108 09/13/2013 1200   CO2 27 10/24/2013 0928   CO2 24 09/13/2013 1200   BUN 8.9 10/24/2013 0928   BUN 13 09/13/2013 1200   CREATININE 0.8 10/24/2013 0928   CREATININE 0.67 09/13/2013 1200      Component Value Date/Time   CALCIUM 9.4 10/24/2013 0928   CALCIUM 9.6 09/13/2013 1200   ALKPHOS 123 10/24/2013 0928   AST 21 10/24/2013 0928   ALT 34 10/24/2013 0928   BILITOT 0.43 10/24/2013 0928     ADDITIONAL INFORMATION: 1. A sample (block 1E) was sent to [facility] for Oncotype testing. The patient's recurrence score is 23. Those patients who had a recurrence score of 23 had an average rate of distant recurrence of 14%. (JBK:ecj 08/20/2013) Morgan Cutter Santos Pathologist, Electronic Signature ( Signed 08/20/2013) 1. CHROMOGENIC IN-SITU HYBRIDIZATION Results: HER-2/NEU BY [facility] - NO AMPLIFICATION OF HER-2 DETECTED. RESULT RATIO OF HER2: CEP 17 SIGNALS 1.00 AVERAGE HER2 COPY NUMBER PER CELL 2.00 REFERENCE RANGE NEGATIVE HER2/Chr17 Ratio <2.0 and Average HER2 copy number <4.0 EQUIVOCAL HER2/Chr17 Ratio <2.0 and Average HER2 copy number 4.0 and <6.0 POSITIVE HER2/Chr17 Ratio >=2.0 and/or Average HER2 copy number >=6.0 Morgan Santos Pathologist, Electronic Signature ( Signed 08/07/2013) FINAL DIAGNOSIS Diagnosis 1. Breast, simple mastectomy, Right - INVASIVE GRADE I DUCTAL CARCINOMA, TWO FOCI MEASURING 1.2 CM AND 1.1 CM IN 1 of 4 FINAL for Morgan Santos, Morgan Santos ([id number]) Diagnosis(continued) GREATEST DIMENSION. - INTERMEDIATE GRADE DUCTAL CARCINOMA IN SITU IS PRESENT. - LYMPH/VASCULAR INVASION IS IDENTIFIED. - INVASIVE DUCTAL CARCINOMA IS EXTREMELY CLOSE  (LESS THAN 0.1 CM) TO ANTERIOR MARGIN. - OTHER MARGINS ARE NEGATIVE. - SEE ONCOLOGY TEMPLATE. 2. Lymph node, sentinel, biopsy, Right - ONE BENIGN  LYMPH WITH NO TUMOR SEEN (0/1). - SEE COMMENT. 3. Lymph node, sentinel, biopsy, Right - ONE BENIGN LYMPH NODE WITH NO TUMOR SEEN (0/1). - SEE COMMENT. Microscopic Comment 1. BREAST, INVASIVE TUMOR, WITH LYMPH NODE SAMPLING Specimen, including laterality and lymph node sampling (sentinel, non-sentinel): Right partial breast with sentinel lymph node sampling. Procedure: Right simple mastectomy with sentinel lymph node biopsies. Histologic type: Ductal carcinoma. Grade: Both tumors are morphologically similar and Grade I with the following characteristics: Tubule formation: 3. Nuclear pleomorphism: 1. Mitotic:1. Tumor size (gross measurement): 1.2 cm and 1.1 cm in greatest dimension. Margins: Invasive, distance to closest margin: Less than 0.1 cm to anterior margin, other margins negative. In-situ, distance to closest margin: At least 0.2 cm. Lymphovascular invasion: Yes, present. Tumor focality: Multifocal, two foci. Treatment effect: Not applicable. Extent of tumor: Skin: Not involved. Nipple: Not involved. Skeletal muscle: Not received. Lymph nodes: Examined: 2 Sentinel. 0 Non-sentinel. 2 Total. Lymph nodes with metastasis: 0. Breast prognostic profile: Performed on previous case, [phone]. Breast prognostic profile performed on larger tumor mass (ribbon clip): Estrogen receptor: 99%, positive. Progesterone receptor: 99%, positive. Her 2 neu: Ratio is 0.98, not amplified. Ki-67: 11%. Breast prognostic profile performed on smaller mass: Estrogen receptor: 89%, positive. Progesterone receptor: 8%, positive. Her 2 neu by [facility]: 1.31, ratio, not amplified. Ki-67: 11%. Non-neoplastic breast: Fibrocystic changes, fibroadenoma formation. 2 of 4 FINAL for Morgan Santos, Morgan Santos ([id number]) Microscopic Comment(continued) TNM: mpT1c,  pN0, MX. Comments: An E-cadherin stain is performed on both tumors which is positive in both confirming their ductal nature. As both masses appear morphologically similar and the previous Her-2 neu by [facility] studies were negative (not amplified) only a single Her-2 neu by [facility] will be repeated (on the larger tumor mass) unless otherwise requested. (RH:ecj 07/15/2013) 2. - 3: As there is a single cell pattern present in the invasive ductal carcinoma in the right mastectomy specimen, a cytokeratin AE1/AE3 stain is performed on each lymph node. The stains are negative confirming the lack of metastatic carcinoma.   RADIOGRAPHIC STUDIES:  No results found.  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 Status post mastectomy. The final pathology revealed 2 foci of disease one measuring 1.1 cm and a second 1.2 cm. Tumor was ER positive PR positive HER-2/neu negative with a proliferation marker Ki-67 11%. Sentinel node was negative. There was lymphovascular invasion identified.  #2 we discussed the pathology in detail today. We discussed Oncotype DX results as well. She understands it is in the intermediate risk category giving her a 14% risk of distant recurrence with tamoxifen only. We discussed role of adjuvant chemotherapy to further reduce her risk of distant recurrence. Recommendation is adjuvant Taxotere and Cytoxan every 3 weeks for a total of 4 cycles. We discussed side effects risks and benefits of treatment. She has consented.  #3 status post cycle 2 day 8 of Taxotere Cytoxan administered on 10/17/2013. Overall she tolerated it well. Neulasta was tolerated well as well.  She did not develop any nausea or vomiting. She'll return in 2 weeks' time for cycle 3 day 1 of her chemotherapy. This will be on 11/07/2013.  #4 anxiety: Patient is very anxious. She does have a prescription for lorazepam.  #5 muscle spasms: Resolved  #6  followup: Patient will be seen back on 11/07/2013 with office visit bloodwork and cycle 3 of chemotherapy.   All questions were answered. The patient knows to call the clinic with any problems,  questions or concerns. We can certainly see the patient much sooner if necessary.  I spent 20 minutes counseling the patient face to face. The total time spent in the appointment was 30 minutes.   Marcy Panning, Santos Medical/Oncology [facility] [phone] (beeper) [phone] (Office)  10/24/2013, 11:11 AM

## 2013-11-07 ENCOUNTER — Ambulatory Visit (HOSPITAL_BASED_OUTPATIENT_CLINIC_OR_DEPARTMENT_OTHER): Payer: PRIVATE HEALTH INSURANCE | Admitting: Oncology

## 2013-11-07 ENCOUNTER — Encounter: Payer: Self-pay | Admitting: Oncology

## 2013-11-07 ENCOUNTER — Ambulatory Visit (HOSPITAL_BASED_OUTPATIENT_CLINIC_OR_DEPARTMENT_OTHER): Payer: PRIVATE HEALTH INSURANCE

## 2013-11-07 ENCOUNTER — Other Ambulatory Visit (HOSPITAL_BASED_OUTPATIENT_CLINIC_OR_DEPARTMENT_OTHER): Payer: PRIVATE HEALTH INSURANCE

## 2013-11-07 VITALS — BP 120/78 | HR 69 | Temp 97.9°F | Resp 18 | Ht 69.0 in | Wt 235.5 lb

## 2013-11-07 DIAGNOSIS — C50419 Malignant neoplasm of upper-outer quadrant of unspecified female breast: Secondary | ICD-10-CM

## 2013-11-07 DIAGNOSIS — D6481 Anemia due to antineoplastic chemotherapy: Secondary | ICD-10-CM

## 2013-11-07 DIAGNOSIS — C50411 Malignant neoplasm of upper-outer quadrant of right female breast: Secondary | ICD-10-CM

## 2013-11-07 DIAGNOSIS — Z5111 Encounter for antineoplastic chemotherapy: Secondary | ICD-10-CM

## 2013-11-07 DIAGNOSIS — F411 Generalized anxiety disorder: Secondary | ICD-10-CM

## 2013-11-07 DIAGNOSIS — T451X5A Adverse effect of antineoplastic and immunosuppressive drugs, initial encounter: Secondary | ICD-10-CM

## 2013-11-07 DIAGNOSIS — E559 Vitamin D deficiency, unspecified: Secondary | ICD-10-CM

## 2013-11-07 DIAGNOSIS — Z170 Estrogen receptor positive status [ER+]: Secondary | ICD-10-CM

## 2013-11-07 LAB — CBC WITH DIFFERENTIAL/PLATELET
BASO%: 0.5 % (ref 0.0–2.0)
Basophils Absolute: 0.1 10*3/uL (ref 0.0–0.1)
EOS ABS: 0 10*3/uL (ref 0.0–0.5)
EOS%: 0.1 % (ref 0.0–7.0)
HCT: 34.9 % (ref 34.8–46.6)
HGB: 11.5 g/dL — ABNORMAL LOW (ref 11.6–15.9)
LYMPH%: 14.7 % (ref 14.0–49.7)
MCH: 28.7 pg (ref 25.1–34.0)
MCHC: 32.9 g/dL (ref 31.5–36.0)
MCV: 87.4 fL (ref 79.5–101.0)
MONO#: 0.7 10*3/uL (ref 0.1–0.9)
MONO%: 6.8 % (ref 0.0–14.0)
NEUT%: 77.9 % — ABNORMAL HIGH (ref 38.4–76.8)
NEUTROS ABS: 7.9 10*3/uL — AB (ref 1.5–6.5)
Platelets: 331 10*3/uL (ref 145–400)
RBC: 4 10*6/uL (ref 3.70–5.45)
RDW: 14.8 % — AB (ref 11.2–14.5)
WBC: 10.1 10*3/uL (ref 3.9–10.3)
lymph#: 1.5 10*3/uL (ref 0.9–3.3)

## 2013-11-07 LAB — COMPREHENSIVE METABOLIC PANEL (CC13)
ALBUMIN: 3.6 g/dL (ref 3.5–5.0)
ALT: 30 U/L (ref 0–55)
AST: 31 U/L (ref 5–34)
Alkaline Phosphatase: 100 U/L (ref 40–150)
Anion Gap: 9 mEq/L (ref 3–11)
BILIRUBIN TOTAL: 0.52 mg/dL (ref 0.20–1.20)
BUN: 12.8 mg/dL (ref 7.0–26.0)
CO2: 24 mEq/L (ref 22–29)
Calcium: 9.8 mg/dL (ref 8.4–10.4)
Chloride: 108 mEq/L (ref 98–109)
Creatinine: 0.8 mg/dL (ref 0.6–1.1)
GLUCOSE: 116 mg/dL (ref 70–140)
POTASSIUM: 3.8 meq/L (ref 3.5–5.1)
Sodium: 141 mEq/L (ref 136–145)
TOTAL PROTEIN: 6.7 g/dL (ref 6.4–8.3)

## 2013-11-07 MED ORDER — SODIUM CHLORIDE 0.9 % IV SOLN
75.0000 mg/m2 | Freq: Once | INTRAVENOUS | Status: AC
  Administered 2013-11-07: 170 mg via INTRAVENOUS
  Filled 2013-11-07: qty 17

## 2013-11-07 MED ORDER — HEPARIN SOD (PORK) LOCK FLUSH 100 UNIT/ML IV SOLN
500.0000 [IU] | Freq: Once | INTRAVENOUS | Status: AC | PRN
  Administered 2013-11-07: 500 [IU]
  Filled 2013-11-07: qty 5

## 2013-11-07 MED ORDER — SODIUM CHLORIDE 0.9 % IV SOLN
Freq: Once | INTRAVENOUS | Status: AC
  Administered 2013-11-07: 14:00:00 via INTRAVENOUS

## 2013-11-07 MED ORDER — SODIUM CHLORIDE 0.9 % IJ SOLN
10.0000 mL | INTRAMUSCULAR | Status: DC | PRN
  Administered 2013-11-07: 10 mL
  Filled 2013-11-07: qty 10

## 2013-11-07 MED ORDER — ONDANSETRON 16 MG/50ML IVPB (CHCC)
16.0000 mg | Freq: Once | INTRAVENOUS | Status: AC
  Administered 2013-11-07: 16 mg via INTRAVENOUS

## 2013-11-07 MED ORDER — DEXAMETHASONE SODIUM PHOSPHATE 20 MG/5ML IJ SOLN
INTRAMUSCULAR | Status: AC
  Filled 2013-11-07: qty 5

## 2013-11-07 MED ORDER — SODIUM CHLORIDE 0.9 % IV SOLN
600.0000 mg/m2 | Freq: Once | INTRAVENOUS | Status: AC
  Administered 2013-11-07: 1400 mg via INTRAVENOUS
  Filled 2013-11-07: qty 70

## 2013-11-07 MED ORDER — DEXAMETHASONE SODIUM PHOSPHATE 20 MG/5ML IJ SOLN
20.0000 mg | Freq: Once | INTRAMUSCULAR | Status: AC
  Administered 2013-11-07: 20 mg via INTRAVENOUS

## 2013-11-07 MED ORDER — ONDANSETRON 16 MG/50ML IVPB (CHCC)
INTRAVENOUS | Status: AC
  Filled 2013-11-07: qty 16

## 2013-11-07 NOTE — Patient Instructions (Signed)
[facility] Discharge Instructions for Patients Receiving Chemotherapy  Today you received the following chemotherapy agents taxotere/cytoxan.   To help prevent nausea and vomiting after your treatment, we encourage you to take your nausea medication as directed.    If you develop nausea and vomiting that is not controlled by your nausea medication, call the clinic.   BELOW ARE SYMPTOMS THAT SHOULD BE REPORTED IMMEDIATELY:  *FEVER GREATER THAN 100.5 F  *CHILLS WITH OR WITHOUT FEVER  NAUSEA AND VOMITING THAT IS NOT CONTROLLED WITH YOUR NAUSEA MEDICATION  *UNUSUAL SHORTNESS OF BREATH  *UNUSUAL BRUISING OR BLEEDING  TENDERNESS IN MOUTH AND THROAT WITH OR WITHOUT PRESENCE OF ULCERS  *URINARY PROBLEMS  *BOWEL PROBLEMS  UNUSUAL RASH Items with * indicate a potential emergency and should be followed up as soon as possible.  Feel free to call the clinic you have any questions or concerns. The clinic phone number is [phone].  

## 2013-11-07 NOTE — Progress Notes (Signed)
OFFICE PROGRESS NOTE  [name], PA-C [address] [place] [address] Dr. Rolm Bookbinder  Dr. Thea Silversmith  DIAGNOSIS: 63 year old female with  diagnosis of right breast cancer  STAGE:  Breast cancer of upper-outer quadrant of right female breast  Primary site: Breast (Right)  Staging method: AJCC 7th Edition  Clinical: Stage IA (T1c, N0, cM0)  Summary: Stage IA (T1c, N0, cM0)   PRIOR THERAPY: #1 Patient underwent a screening mammogram and was found to have a right breast mass laterally. An ultrasound showed a 7 mm lesion at the 9:00 position 7 cm from the nipple. She was also found to have a second medial lesion at the 9:00 position 4 cm from the nipple. She underwent a biopsy of both lesions. 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 The second lesion also revealed invasive ductal carcinoma again ER positive PR positive HER-2/neu negative with a proliferation marker Ki-67 11%. She had an MRI performed that revealed 1.3. cm postbiopsy changes. She also was noted to have area of concern from anterior to posterior enhancement. This area measured 6 cm  #2 patient is status post right mastectomy done 07/12/2013. Pathology revealed:  1. Breast, simple mastectomy, Right - INVASIVE GRADE I DUCTAL CARCINOMA, TWO FOCI MEASURING 1.2 CM AND 1.1 CM IN 1 of 4 FINAL for Morgan Santos, Morgan Santos ([id number]) Diagnosis(continued) GREATEST DIMENSION. - INTERMEDIATE GRADE DUCTAL CARCINOMA IN SITU IS PRESENT. - LYMPH/VASCULAR INVASION IS IDENTIFIED. - INVASIVE DUCTAL CARCINOMA IS EXTREMELY CLOSE (LESS THAN 0.1 CM) TO ANTERIOR MARGIN. - OTHER MARGINS ARE NEGATIVE. - SEE ONCOLOGY TEMPLATE. 2. Lymph node, sentinel, biopsy, Right - ONE BENIGN LYMPH WITH NO TUMOR SEEN (0/1). - SEE COMMENT. 3. Lymph node, sentinel, biopsy, Right - ONE  BENIGN LYMPH NODE WITH NO TUMOR SEEN (0/1). - SEE COMMENT.  #3 patient had Oncotype DX testing performed. d The patient's recurrence score is 23. Those patients who had a recurrence score of 23 had an average rate of distant recurrence of 14%. (JBK:ecj 08/20/2013) . This is in the intermediate risk category. We discussed adjuvant chemotherapy consisting of 4 cycles of Taxotere and Cytoxan   CURRENT THERAPY: Cycle 3 day 1 Taxotere Cytoxan with day 2 Neulasta  INTERVAL HISTORY: Morgan Santos 63 y.o. female returns for followup visit. Patient is here for cycle 3 of her Taxotere Cytoxan being given adjuvantly. Clinically today she is doing terrific. She does tell me that at this time around the taste buds did not come back in so food does not taste as good. But she did not develop any other problems. She has her energy back. Today she denies any headaches double vision blurring of vision fevers chills night sweats. No shortness of breath chest pains palpitations. No abdominal pain no diarrhea or constipation. She has no easy bruising or bleeding. She has no myalgias and arthralgias. No peripheral paresthesias or gait disturbances. Remainder of the 10 point review of systems is negative.   MEDICAL HISTORY: Past Medical History  Diagnosis Date  . Breast cancer   . Anxiety   . Depression     pt. remarks that she "takes respridal because if I don't take it I get angry"   . GERD (gastroesophageal reflux disease)     pt. uses vinegar for heartburn, ususally once per week   . Arthritis  hip- R, back   . Hypertension     "when I get upset"  Not on medication  . Stroke ?2010 or 2011    2010, [facility] for stroke "mini stroke" short term memory problems since  . Anemia     when she was younger  . Constipation   . Complication of anesthesia   . PONV (postoperative nausea and vomiting)     "just for one day"    ALLERGIES:  is allergic to codeine and multihance.  MEDICATIONS:  Current  Outpatient Prescriptions  Medication Sig Dispense Refill  . ALPRAZolam (XANAX) 0.5 MG tablet Take 0.5 mg by mouth at bedtime as needed for anxiety.       Marland Kitchen aspirin 81 MG tablet Take 81 mg by mouth daily.      . Cholecalciferol (VITAMIN D-3) 5000 UNITS TABS Take 2 each by mouth daily.      Marland Kitchen dexamethasone (DECADRON) 4 MG tablet Take 2 tablets (8 mg total) by mouth 2 (two) times daily with a meal. Take two times a day the day before Taxotere. Then take two times a day starting the day after chemo for 3 days.  30 tablet  1  . dexamethasone (DECADRON) 4 MG tablet Take 2 tabl twice daily the day prior to chemo and for 3 days following chemotherapy  34 tablet  0  . diazepam (VALIUM) 5 MG tablet Take 1 tablet (5 mg total) by mouth every 8 (eight) hours as needed for anxiety or muscle spasms.  30 tablet  0  . Flaxseed, Linseed, (FLAX SEED OIL) 1000 MG CAPS Take 2 each by mouth daily.      Marland Kitchen lidocaine-prilocaine (EMLA) cream Apply 1 application topically as needed.  30 g  1  . LORazepam (ATIVAN) 0.5 MG tablet Take 1 tablet (0.5 mg total) by mouth every 6 (six) hours as needed (Nausea or vomiting).  30 tablet  0  . ondansetron (ZOFRAN) 8 MG tablet Take 1 tablet (8 mg total) by mouth 2 (two) times daily. Take two times a day starting the day after chemo for 3 days. Then take two times a day as needed for nausea or vomiting.  30 tablet  1  . oxyCODONE (OXY IR/ROXICODONE) 5 MG immediate release tablet Take 1 tablet (5 mg total) by mouth every 4 (four) hours as needed for moderate pain.  30 tablet  0  . prochlorperazine (COMPAZINE) 10 MG tablet Take 1 tablet (10 mg total) by mouth every 6 (six) hours as needed (Nausea or vomiting).  30 tablet  1  . risperiDONE (RISPERDAL) 0.25 MG tablet Take 1 mg by mouth at bedtime.      . simvastatin (ZOCOR) 20 MG tablet Take 20 mg by mouth every evening.      Marland Kitchen UNABLE TO FIND Apply 1 applicator topically as needed. 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 oxyCODONE-acetaminophen (PERCOCET) 10-325 MG per tablet Take 1 tablet by mouth every 6 (six) hours as needed for pain.  20 tablet  0   No current facility-administered medications for this visit.    SURGICAL HISTORY:  Past Surgical History  Procedure Laterality Date  . Cholecystectomy    . Breast surgery      for abcesses- removed from both breasts, many yrs. ago  . Mastectomy w/ sentinel node biopsy Right 07/12/2013    Procedure: MASTECTOMY WITH SENTINEL LYMPH NODE BIOPSY;  Surgeon: Rolm Bookbinder, MD;  Location: [facility];  Service: General;  Laterality: Right;  . Breast reconstruction with placement of tissue expander and flex hd (acellular hydrated dermis) Right 07/12/2013    Procedure: RIGHT BREAST RECONSTRUCTION WITH PLACEMENT OF TISSUE EXPANDER AND FLEX HD TO RIGHT BREAST (ACELLULAR HYDRATED DERMIS);  Surgeon: Irene Limbo, MD;  Location: [facility];  Service: Plastics;  Laterality: Right;  . Vaginal hysterectomy    . Colonoscopy    . Mastectomy    . Tubal ligation    . Dilation and curettage of uterus    . Portacath placement N/A 09/16/2013    Procedure: INSERTION PORT-A-CATH;  Surgeon: Rolm Bookbinder, MD;  Location: [facility];  Service: General;  Laterality: N/A;    REVIEW OF SYSTEMS:  Pertinent items are noted in HPI. Nursing notes reviewd and agree.   PHYSICAL EXAMINATION: Blood pressure 120/78, pulse 69, temperature 97.9 F (36.6 C), temperature source Oral, resp. rate 18, height _0  (1.753 m), weight 235 lb 8 oz (106.822 kg). Body mass index is 34.76 kg/(m^2). ECOG PERFORMANCE STATUS: 0 - Asymptomatic   General appearance: alert, cooperative and appears stated age Lymph nodes: Cervical, supraclavicular, and axillary nodes normal. Resp: clear to auscultation bilaterally Back: symmetric, no curvature. ROM normal. No CVA tenderness. Cardio: regular rate and rhythm GI: soft, non-tender; bowel sounds normal; no masses,  no organomegaly Extremities:  extremities normal, atraumatic, no cyanosis or edema Neurologic: Grossly normal Right breast: Surgical scar looks good. Expander is in place healing well. Left breast no masses or nipple discharge.No localized chest wall  Pain.  LABORATORY DATA: Lab Results  Component Value Date   WBC 10.1 11/07/2013   HGB 11.5* 11/07/2013   HCT 34.9 11/07/2013   MCV 87.4 11/07/2013   PLT 331 11/07/2013      Chemistry      Component Value Date/Time   NA 141 11/07/2013 1210   NA 146 09/13/2013 1200   K 3.8 11/07/2013 1210   K 4.5 09/13/2013 1200   CL 108 09/13/2013 1200   CO2 24 11/07/2013 1210   CO2 24 09/13/2013 1200   BUN 12.8 11/07/2013 1210   BUN 13 09/13/2013 1200   CREATININE 0.8 11/07/2013 1210   CREATININE 0.67 09/13/2013 1200      Component Value Date/Time   CALCIUM 9.8 11/07/2013 1210   CALCIUM 9.6 09/13/2013 1200   ALKPHOS 100 11/07/2013 1210   AST 31 11/07/2013 1210   ALT 30 11/07/2013 1210   BILITOT 0.52 11/07/2013 1210     ADDITIONAL INFORMATION: 1. A sample (block 1E) was sent to [facility] for Oncotype testing. The patient's recurrence score is 23. Those patients who had a recurrence score of 23 had an average rate of distant recurrence of 14%. (JBK:ecj 08/20/2013) Enid Cutter MD Pathologist, Electronic Signature ( Signed 08/20/2013) 1. CHROMOGENIC IN-SITU HYBRIDIZATION Results: HER-2/NEU BY [facility] - NO AMPLIFICATION OF HER-2 DETECTED. RESULT RATIO OF HER2: CEP 17 SIGNALS 1.00 AVERAGE HER2 COPY NUMBER PER CELL 2.00 REFERENCE RANGE NEGATIVE HER2/Chr17 Ratio <2.0 and Average HER2 copy number <4.0 EQUIVOCAL HER2/Chr17 Ratio <2.0 and Average HER2 copy number 4.0 and <6.0 POSITIVE HER2/Chr17 Ratio >=2.0 and/or Average HER2 copy number >=6.0 Claudette Laws MD Pathologist, Electronic Signature ( Signed 08/07/2013) FINAL DIAGNOSIS Diagnosis 1. Breast, simple mastectomy, Right - INVASIVE GRADE I DUCTAL CARCINOMA, TWO FOCI MEASURING 1.2 CM AND 1.1 CM IN 1 of 4 FINAL for Morgan Santos, Morgan Santos  ([id number]) Diagnosis(continued) GREATEST DIMENSION. - INTERMEDIATE GRADE DUCTAL CARCINOMA IN SITU IS PRESENT. - LYMPH/VASCULAR INVASION IS IDENTIFIED. - INVASIVE DUCTAL CARCINOMA  IS EXTREMELY CLOSE (LESS THAN 0.1 CM) TO ANTERIOR MARGIN. - OTHER MARGINS ARE NEGATIVE. - SEE ONCOLOGY TEMPLATE. 2. Lymph node, sentinel, biopsy, Right - ONE BENIGN LYMPH WITH NO TUMOR SEEN (0/1). - SEE COMMENT. 3. Lymph node, sentinel, biopsy, Right - ONE BENIGN LYMPH NODE WITH NO TUMOR SEEN (0/1). - SEE COMMENT. Microscopic Comment 1. BREAST, INVASIVE TUMOR, WITH LYMPH NODE SAMPLING Specimen, including laterality and lymph node sampling (sentinel, non-sentinel): Right partial breast with sentinel lymph node sampling. Procedure: Right simple mastectomy with sentinel lymph node biopsies. Histologic type: Ductal carcinoma. Grade: Both tumors are morphologically similar and Grade I with the following characteristics: Tubule formation: 3. Nuclear pleomorphism: 1. Mitotic:1. Tumor size (gross measurement): 1.2 cm and 1.1 cm in greatest dimension. Margins: Invasive, distance to closest margin: Less than 0.1 cm to anterior margin, other margins negative. In-situ, distance to closest margin: At least 0.2 cm. Lymphovascular invasion: Yes, present. Tumor focality: Multifocal, two foci. Treatment effect: Not applicable. Extent of tumor: Skin: Not involved. Nipple: Not involved. Skeletal muscle: Not received. Lymph nodes: Examined: 2 Sentinel. 0 Non-sentinel. 2 Total. Lymph nodes with metastasis: 0. Breast prognostic profile: Performed on previous case, [phone]. Breast prognostic profile performed on larger tumor mass (ribbon clip): Estrogen receptor: 99%, positive. Progesterone receptor: 99%, positive. Her 2 neu: Ratio is 0.98, not amplified. Ki-67: 11%. Breast prognostic profile performed on smaller mass: Estrogen receptor: 89%, positive. Progesterone receptor: 8%, positive. Her 2 neu by  [facility]: 1.31, ratio, not amplified. Ki-67: 11%. Non-neoplastic breast: Fibrocystic changes, fibroadenoma formation. 2 of 4 FINAL for Morgan Santos, Morgan Santos ([id number]) Microscopic Comment(continued) TNM: mpT1c, pN0, MX. Comments: An E-cadherin stain is performed on both tumors which is positive in both confirming their ductal nature. As both masses appear morphologically similar and the previous Her-2 neu by [facility] studies were negative (not amplified) only a single Her-2 neu by [facility] will be repeated (on the larger tumor mass) unless otherwise requested. (RH:ecj 07/15/2013) 2. - 3: As there is a single cell pattern present in the invasive ductal carcinoma in the right mastectomy specimen, a cytokeratin AE1/AE3 stain is performed on each lymph node. The stains are negative confirming the lack of metastatic carcinoma.   RADIOGRAPHIC STUDIES:  No results found.  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 Status post mastectomy. The final pathology revealed 2 foci of disease one measuring 1.1 cm and a second 1.2 cm. Tumor was ER positive PR positive HER-2/neu negative with a proliferation marker Ki-67 11%. Sentinel node was negative. There was lymphovascular invasion identified.  #2 we discussed the pathology in detail today. We discussed Oncotype DX results as well. She understands it is in the intermediate risk category giving her a 14% risk of distant recurrence with tamoxifen only. We discussed role of adjuvant chemotherapy to further reduce her risk of distant recurrence. Recommendation is adjuvant Taxotere and Cytoxan every 3 weeks for a total of 4 cycles. We discussed side effects risks and benefits of treatment. She has consented.  #3  cycle 3 day 1 of Taxotere Cytoxan administered on 11/07/13. I reviewed her CBC, chemistries and liver function studies. They all look terrific. She will proceed with her scheduled chemotherapy today.  #4  anxiety: Patient is very anxious. She does have a prescription for lorazepam.  #5 muscle spasms: Resolved  #6 very mild anemia with a hemoglobin 11.5: Due to chemotherapy continue to monitor  #6 followup: Patient will be seen back on 11/14/2013 with  office visit bloodwork and to assess chemotherapy toxicity   All questions were answered. The patient knows to call the clinic with any problems, questions or concerns. We can certainly see the patient much sooner if necessary.  I spent 20 minutes counseling the patient face to face. The total time spent in the appointment was 30 minutes.   Marcy Panning, MD Medical/Oncology [facility] [phone] (beeper) [phone] (Office)  11/07/2013, 1:30 PM

## 2013-11-08 ENCOUNTER — Ambulatory Visit (HOSPITAL_BASED_OUTPATIENT_CLINIC_OR_DEPARTMENT_OTHER): Payer: PRIVATE HEALTH INSURANCE

## 2013-11-08 VITALS — BP 142/67 | HR 73 | Temp 97.5°F

## 2013-11-08 DIAGNOSIS — C50411 Malignant neoplasm of upper-outer quadrant of right female breast: Secondary | ICD-10-CM

## 2013-11-08 DIAGNOSIS — C50419 Malignant neoplasm of upper-outer quadrant of unspecified female breast: Secondary | ICD-10-CM

## 2013-11-08 DIAGNOSIS — Z5189 Encounter for other specified aftercare: Secondary | ICD-10-CM

## 2013-11-08 LAB — VITAMIN D 25 HYDROXY (VIT D DEFICIENCY, FRACTURES): Vit D, 25-Hydroxy: 86 ng/mL (ref 30–89)

## 2013-11-08 MED ORDER — PEGFILGRASTIM INJECTION 6 MG/0.6ML
6.0000 mg | Freq: Once | SUBCUTANEOUS | Status: AC
  Administered 2013-11-08: 6 mg via SUBCUTANEOUS
  Filled 2013-11-08: qty 0.6

## 2013-11-08 NOTE — Patient Instructions (Signed)

## 2013-11-14 ENCOUNTER — Ambulatory Visit (HOSPITAL_BASED_OUTPATIENT_CLINIC_OR_DEPARTMENT_OTHER): Payer: PRIVATE HEALTH INSURANCE | Admitting: Oncology

## 2013-11-14 ENCOUNTER — Other Ambulatory Visit (HOSPITAL_BASED_OUTPATIENT_CLINIC_OR_DEPARTMENT_OTHER): Payer: PRIVATE HEALTH INSURANCE

## 2013-11-14 ENCOUNTER — Encounter: Payer: Self-pay | Admitting: Oncology

## 2013-11-14 ENCOUNTER — Other Ambulatory Visit: Payer: Self-pay | Admitting: Hematology and Oncology

## 2013-11-14 VITALS — BP 119/72 | HR 97 | Temp 98.9°F | Resp 18 | Ht 69.0 in | Wt 231.4 lb

## 2013-11-14 DIAGNOSIS — C50419 Malignant neoplasm of upper-outer quadrant of unspecified female breast: Secondary | ICD-10-CM

## 2013-11-14 DIAGNOSIS — C50411 Malignant neoplasm of upper-outer quadrant of right female breast: Secondary | ICD-10-CM

## 2013-11-14 DIAGNOSIS — Z170 Estrogen receptor positive status [ER+]: Secondary | ICD-10-CM

## 2013-11-14 LAB — CBC WITH DIFFERENTIAL/PLATELET
BASO%: 0.4 % (ref 0.0–2.0)
Basophils Absolute: 0.1 10*3/uL (ref 0.0–0.1)
EOS%: 0.6 % (ref 0.0–7.0)
Eosinophils Absolute: 0.1 10*3/uL (ref 0.0–0.5)
HEMATOCRIT: 35.5 % (ref 34.8–46.6)
HGB: 11.5 g/dL — ABNORMAL LOW (ref 11.6–15.9)
LYMPH#: 1.9 10*3/uL (ref 0.9–3.3)
LYMPH%: 15.1 % (ref 14.0–49.7)
MCH: 28.8 pg (ref 25.1–34.0)
MCHC: 32.4 g/dL (ref 31.5–36.0)
MCV: 89 fL (ref 79.5–101.0)
MONO#: 1.2 10*3/uL — ABNORMAL HIGH (ref 0.1–0.9)
MONO%: 9.3 % (ref 0.0–14.0)
NEUT#: 9.2 10*3/uL — ABNORMAL HIGH (ref 1.5–6.5)
NEUT%: 74.6 % (ref 38.4–76.8)
NRBC: 0 % (ref 0–0)
Platelets: 253 10*3/uL (ref 145–400)
RBC: 3.99 10*6/uL (ref 3.70–5.45)
RDW: 15.6 % — ABNORMAL HIGH (ref 11.2–14.5)
WBC: 12.4 10*3/uL — AB (ref 3.9–10.3)

## 2013-11-14 LAB — COMPREHENSIVE METABOLIC PANEL (CC13)
ALK PHOS: 127 U/L (ref 40–150)
ALT: 31 U/L (ref 0–55)
AST: 20 U/L (ref 5–34)
Albumin: 3.4 g/dL — ABNORMAL LOW (ref 3.5–5.0)
Anion Gap: 12 mEq/L — ABNORMAL HIGH (ref 3–11)
BUN: 7 mg/dL (ref 7.0–26.0)
CALCIUM: 9.1 mg/dL (ref 8.4–10.4)
CO2: 23 mEq/L (ref 22–29)
Chloride: 108 mEq/L (ref 98–109)
Creatinine: 0.8 mg/dL (ref 0.6–1.1)
Glucose: 144 mg/dl — ABNORMAL HIGH (ref 70–140)
POTASSIUM: 3.4 meq/L — AB (ref 3.5–5.1)
Sodium: 142 mEq/L (ref 136–145)
Total Bilirubin: 0.5 mg/dL (ref 0.20–1.20)
Total Protein: 6.1 g/dL — ABNORMAL LOW (ref 6.4–8.3)

## 2013-11-14 NOTE — Progress Notes (Signed)
[place] OFFICE PROGRESS NOTE  Patient Care Team: [name] as PCP - General (General Practice) Amada Kingfisher, MD as Consulting Physician (Hematology and Oncology) Dr. Rolm Bookbinder  Dr. Thea Silversmith  DIAGNOSIS: 63 year old female with diagnosis of right breast cancer  STAGE:  Breast cancer of upper-outer quadrant of right female breast  Primary site: Breast (Right)  Staging method: AJCC 7th Edition  Clinical: Stage IA (T1c, N0, cM0)  Summary: Stage IA (T1c, N0, cM0)  SUMMARY OF ONCOLOGIC HISTORY: #1 Patient underwent a screening mammogram and was found to have a right breast mass laterally. An ultrasound showed a 7 mm lesion at the 9:00 position 7 cm from the nipple. She was also found to have a second medial lesion at the 9:00 position 4 cm from the nipple. She underwent a biopsy of both lesions. 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 The second lesion also revealed invasive ductal carcinoma again ER positive PR positive HER-2/neu negative with a proliferation marker Ki-67 11%. She had an MRI performed that revealed 1.3. cm postbiopsy changes. She also was noted to have area of concern from anterior to posterior enhancement. This area measured 6 cm  #2 patient is status post right mastectomy done 07/12/2013. Pathology revealed:  1. Breast, simple mastectomy, Right  - INVASIVE GRADE I DUCTAL CARCINOMA, TWO FOCI MEASURING 1.2 CM AND 1.1 CM IN  1 of 4  FINAL for RAGUEL, KOSLOSKI ([id number])  Diagnosis(continued)  GREATEST DIMENSION.  - INTERMEDIATE GRADE DUCTAL CARCINOMA IN SITU IS PRESENT.  - LYMPH/VASCULAR INVASION IS IDENTIFIED.  - INVASIVE DUCTAL CARCINOMA IS EXTREMELY CLOSE (LESS THAN 0.1 CM) TO ANTERIOR  MARGIN.  - OTHER MARGINS ARE NEGATIVE.  - SEE ONCOLOGY TEMPLATE.  2. Lymph node, sentinel,  biopsy, Right  - ONE BENIGN LYMPH WITH NO TUMOR SEEN (0/1).  - SEE COMMENT.  3. Lymph node, sentinel, biopsy, Right  - ONE BENIGN LYMPH NODE WITH NO TUMOR SEEN (0/1).  - SEE COMMENT.  #3 patient had Oncotype DX testing performed. d The patient's recurrence  score is 23. Those patients who had a recurrence score of 23 had an average rate of distant  recurrence of 14%. (JBK:ecj 08/20/2013) . This is in the intermediate risk category. We discussed adjuvant chemotherapy consisting of 4 cycles of Taxotere and Cytoxan   CURRENT THERAPY: S/P Cycle 3 day 8 Taxotere Cytoxan with day 2 Neulasta   INTERVAL HISTORY: PHAEDRA COLGATE 63 y.o. female returns for followup today after receiving cycle 3 of Taxotere/Cytoxan. She tells me that she tolerated this cycle very well. She had very minimal nausea. She also has not had as much of 8 giving from the Neulasta as she has experienced in the past. She is also noticed that her hair is beginning to grow back. She is concerned about this I reassured her that this happens. She has not had any headaches double vision blurring of vision fevers chills or night sweats. Patient basically is ready to get over cycle 4 of her treatment and 1 with her life. She does have a right breast tissue expander in place and certainly this will need to be replaced with an implant. She will call her plastic surgeon to get that set up in the next few weeks. Her last cycle of Taxotere and Cytoxan is due on April 9. I do think  she'll be able to proceed with remainder of her reconstructive process about 3 weeks after the chemotherapy is administered.  I have reviewed the past medical history, past surgical history, social history and family history with the patient and they are unchanged from previous note.  ALLERGIES:  is allergic to codeine and multihance.  MEDICATIONS:  Current Outpatient Prescriptions  Medication Sig Dispense Refill  . ALPRAZolam (XANAX) 0.5 MG tablet Take 0.5 mg by  mouth at bedtime as needed for anxiety.       Marland Kitchen aspirin 81 MG tablet Take 81 mg by mouth daily.      . Cholecalciferol (VITAMIN D-3) 5000 UNITS TABS Take 2 each by mouth daily.      Marland Kitchen dexamethasone (DECADRON) 4 MG tablet Take 2 tablets (8 mg total) by mouth 2 (two) times daily with a meal. Take two times a day the day before Taxotere. Then take two times a day starting the day after chemo for 3 days.  30 tablet  1  . dexamethasone (DECADRON) 4 MG tablet Take 2 tabl twice daily the day prior to chemo and for 3 days following chemotherapy  34 tablet  0  . diazepam (VALIUM) 5 MG tablet Take 1 tablet (5 mg total) by mouth every 8 (eight) hours as needed for anxiety or muscle spasms.  30 tablet  0  . Flaxseed, Linseed, (FLAX SEED OIL) 1000 MG CAPS Take 2 each by mouth daily.      Marland Kitchen lidocaine-prilocaine (EMLA) cream Apply 1 application topically as needed.  30 g  1  . LORazepam (ATIVAN) 0.5 MG tablet Take 1 tablet (0.5 mg total) by mouth every 6 (six) hours as needed (Nausea or vomiting).  30 tablet  0  . ondansetron (ZOFRAN) 8 MG tablet Take 1 tablet (8 mg total) by mouth 2 (two) times daily. Take two times a day starting the day after chemo for 3 days. Then take two times a day as needed for nausea or vomiting.  30 tablet  1  . oxyCODONE (OXY IR/ROXICODONE) 5 MG immediate release tablet Take 1 tablet (5 mg total) by mouth every 4 (four) hours as needed for moderate pain.  30 tablet  0  . oxyCODONE-acetaminophen (PERCOCET) 10-325 MG per tablet Take 1 tablet by mouth every 6 (six) hours as needed for pain.  20 tablet  0  . prochlorperazine (COMPAZINE) 10 MG tablet Take 1 tablet (10 mg total) by mouth every 6 (six) hours as needed (Nausea or vomiting).  30 tablet  1  . risperiDONE (RISPERDAL) 0.25 MG tablet Take 1 mg by mouth at bedtime.      . simvastatin (ZOCOR) 20 MG tablet Take 20 mg by mouth every evening.      Marland Kitchen UNABLE TO FIND Apply 1 applicator topically as needed. Med Name: cranial prosthesis for  chemotherapy induced alopecia  1 application  0   No current facility-administered medications for this visit.    REVIEW OF SYSTEMS:   Constitutional: Denies fevers, chills or abnormal weight loss Eyes: Denies blurriness of vision Ears, nose, mouth, throat, and face: Denies mucositis or sore throat Respiratory: Denies cough, dyspnea or wheezes Cardiovascular: Denies palpitation, chest discomfort or lower extremity swelling Gastrointestinal:  Denies nausea, heartburn or change in bowel habits Skin: Denies abnormal skin rashes Lymphatics: Denies new lymphadenopathy or easy bruising Neurological:Denies numbness, tingling or new weaknesses Behavioral/Psych: Mood is stable, no new changes  All other systems were reviewed with the patient and are negative.  PHYSICAL EXAMINATION:  ECOG PERFORMANCE STATUS: 1 - Symptomatic but completely ambulatory  Filed Vitals:   11/14/13 1138  BP: 119/72  Pulse: 97  Temp: 98.9 F (37.2 C)  Resp: 18   Filed Weights   11/14/13 1138  Weight: 231 lb 6.4 oz (104.962 kg)    GENERAL:alert, no distress and comfortable SKIN: skin color, texture, turgor are normal, no rashes or significant lesions EYES: normal, Conjunctiva are pink and non-injected, sclera clear OROPHARYNX:no exudate, no erythema and lips, buccal mucosa, and tongue normal  NECK: supple, thyroid normal size, non-tender, without nodularity LYMPH:  no palpable lymphadenopathy in the cervical, axillary or inguinal LUNGS: clear to auscultation and percussion with normal breathing effort HEART: regular rate & rhythm and no murmurs and no lower extremity edema ABDOMEN:abdomen soft, non-tender and normal bowel sounds Musculoskeletal:no cyanosis of digits and no clubbing  NEURO: alert & oriented x 3 with fluent speech, no focal motor/sensory deficits Left breast: No masses nipple discharge no skin changes, right tissue expander in place no evidence of infections. LABORATORY DATA:  I have  reviewed the data as listed    Component Value Date/Time   NA 141 11/07/2013 1210   NA 146 09/13/2013 1200   K 3.8 11/07/2013 1210   K 4.5 09/13/2013 1200   CL 108 09/13/2013 1200   CO2 24 11/07/2013 1210   CO2 24 09/13/2013 1200   GLUCOSE 116 11/07/2013 1210   GLUCOSE 96 09/13/2013 1200   BUN 12.8 11/07/2013 1210   BUN 13 09/13/2013 1200   CREATININE 0.8 11/07/2013 1210   CREATININE 0.67 09/13/2013 1200   CALCIUM 9.8 11/07/2013 1210   CALCIUM 9.6 09/13/2013 1200   PROT 6.7 11/07/2013 1210   ALBUMIN 3.6 11/07/2013 1210   AST 31 11/07/2013 1210   ALT 30 11/07/2013 1210   ALKPHOS 100 11/07/2013 1210   BILITOT 0.52 11/07/2013 1210   GFRNONAA >90 09/13/2013 1200   GFRAA >90 09/13/2013 1200    No results found for this basename: SPEP, UPEP,  kappa and lambda light chains    Lab Results  Component Value Date   WBC 12.4* 11/14/2013   NEUTROABS 9.2* 11/14/2013   HGB 11.5* 11/14/2013   HCT 35.5 11/14/2013   MCV 89.0 11/14/2013   PLT 253 11/14/2013      Chemistry      Component Value Date/Time   NA 141 11/07/2013 1210   NA 146 09/13/2013 1200   K 3.8 11/07/2013 1210   K 4.5 09/13/2013 1200   CL 108 09/13/2013 1200   CO2 24 11/07/2013 1210   CO2 24 09/13/2013 1200   BUN 12.8 11/07/2013 1210   BUN 13 09/13/2013 1200   CREATININE 0.8 11/07/2013 1210   CREATININE 0.67 09/13/2013 1200      Component Value Date/Time   CALCIUM 9.8 11/07/2013 1210   CALCIUM 9.6 09/13/2013 1200   ALKPHOS 100 11/07/2013 1210   AST 31 11/07/2013 1210   ALT 30 11/07/2013 1210   BILITOT 0.52 11/07/2013 1210       RADIOGRAPHIC STUDIES: I have personally reviewed the radiological images as listed and agreed with the findings in the report. No results found.    ASSESSMENT & PLAN:  63 year old female with  #1 stage I (T1 N0) invasive ductal carcinoma of the right breast status post mastectomy with sentinel lymph node biopsy. The final pathology revealed 2 foci of disease one measuring 1.1 cm and the second measuring 1.2 cm. Tumor  was ER positive PR positive HER-2/neu negative with a proliferation  marker Ki-67 11%. Sentinel node was negative, there was no lymphovascular invasion identified. Patient went on to have Oncotype DX testing performed that revealed risk of 14% distant recurrence with tamoxifen only putting her in the intermediate risk category. She was recommended adjuvant chemotherapy consisting of Taxotere/Cytoxan every 3 weeks for a total of 4 cycles. She is status post cycle 3 which was administered on 11/07/2013. So far she is tolerating her chemotherapy well. Her next treatment will be given on 11/28/2013. Once patient completes her chemotherapy she will be prescribed antiestrogen therapy. Since she is postmenopausal I have recommended aromatase inhibitor consisting of a remedy X1 milligram daily. Total of 5 years of therapy will be planned.  #2 adjuvant chemotherapy: Status post cycle 3 of Taxotere Cytoxan. I have reviewed her CBC with her her counts look terrific she is doing well on day 8 post chemotherapy.  #3 patient is recommended to call her plastic surgeon to set up an appointment so that she may begin the process of reconstruction.  #4 hemoglobin is 11.5 which is stable we will continue to monitor this.  #5 followup: Patient will be seen back on 11/28/2013 for cycle 4 of her chemotherapy.   No orders of the defined types were placed in this encounter.   All questions were answered. The patient knows to call the clinic with any problems, questions or concerns. No barriers to learning was detected. I spent 15 minutes counseling the patient face to face. The total time spent in the appointment was 30 minutes and more than 50% was on counseling and review of test results     Marcy Panning, MD 11/14/2013 12:48 PM

## 2013-11-14 NOTE — Patient Instructions (Signed)
Doing well   Your blood counts look good  We will see you back on 4/9 for final cycle of chemotherapy

## 2013-11-27 ENCOUNTER — Other Ambulatory Visit: Payer: Self-pay

## 2013-11-28 ENCOUNTER — Ambulatory Visit (HOSPITAL_BASED_OUTPATIENT_CLINIC_OR_DEPARTMENT_OTHER): Payer: PRIVATE HEALTH INSURANCE

## 2013-11-28 ENCOUNTER — Other Ambulatory Visit (HOSPITAL_BASED_OUTPATIENT_CLINIC_OR_DEPARTMENT_OTHER): Payer: PRIVATE HEALTH INSURANCE

## 2013-11-28 ENCOUNTER — Encounter: Payer: Self-pay | Admitting: Adult Health

## 2013-11-28 ENCOUNTER — Other Ambulatory Visit: Payer: PRIVATE HEALTH INSURANCE

## 2013-11-28 ENCOUNTER — Ambulatory Visit (HOSPITAL_BASED_OUTPATIENT_CLINIC_OR_DEPARTMENT_OTHER): Payer: PRIVATE HEALTH INSURANCE | Admitting: Adult Health

## 2013-11-28 VITALS — BP 139/79 | HR 83 | Temp 98.3°F | Resp 20 | Ht 69.0 in | Wt 234.9 lb

## 2013-11-28 DIAGNOSIS — C50411 Malignant neoplasm of upper-outer quadrant of right female breast: Secondary | ICD-10-CM

## 2013-11-28 DIAGNOSIS — Z5111 Encounter for antineoplastic chemotherapy: Secondary | ICD-10-CM

## 2013-11-28 DIAGNOSIS — C50419 Malignant neoplasm of upper-outer quadrant of unspecified female breast: Secondary | ICD-10-CM

## 2013-11-28 DIAGNOSIS — E2839 Other primary ovarian failure: Secondary | ICD-10-CM

## 2013-11-28 DIAGNOSIS — Z170 Estrogen receptor positive status [ER+]: Secondary | ICD-10-CM

## 2013-11-28 LAB — CBC WITH DIFFERENTIAL/PLATELET
BASO%: 0.3 % (ref 0.0–2.0)
BASOS ABS: 0 10*3/uL (ref 0.0–0.1)
EOS%: 0 % (ref 0.0–7.0)
Eosinophils Absolute: 0 10*3/uL (ref 0.0–0.5)
HCT: 34.5 % — ABNORMAL LOW (ref 34.8–46.6)
HGB: 11.3 g/dL — ABNORMAL LOW (ref 11.6–15.9)
LYMPH%: 13.5 % — AB (ref 14.0–49.7)
MCH: 29.4 pg (ref 25.1–34.0)
MCHC: 32.8 g/dL (ref 31.5–36.0)
MCV: 89.5 fL (ref 79.5–101.0)
MONO#: 0.4 10*3/uL (ref 0.1–0.9)
MONO%: 6.7 % (ref 0.0–14.0)
NEUT#: 5 10*3/uL (ref 1.5–6.5)
NEUT%: 79.5 % — ABNORMAL HIGH (ref 38.4–76.8)
PLATELETS: 218 10*3/uL (ref 145–400)
RBC: 3.86 10*6/uL (ref 3.70–5.45)
RDW: 16.7 % — ABNORMAL HIGH (ref 11.2–14.5)
WBC: 6.3 10*3/uL (ref 3.9–10.3)
lymph#: 0.8 10*3/uL — ABNORMAL LOW (ref 0.9–3.3)

## 2013-11-28 LAB — COMPREHENSIVE METABOLIC PANEL (CC13)
ALK PHOS: 88 U/L (ref 40–150)
ALT: 23 U/L (ref 0–55)
AST: 18 U/L (ref 5–34)
Albumin: 3.7 g/dL (ref 3.5–5.0)
Anion Gap: 9 mEq/L (ref 3–11)
BUN: 12.8 mg/dL (ref 7.0–26.0)
CO2: 24 mEq/L (ref 22–29)
Calcium: 9.5 mg/dL (ref 8.4–10.4)
Chloride: 110 mEq/L — ABNORMAL HIGH (ref 98–109)
Creatinine: 0.7 mg/dL (ref 0.6–1.1)
GLUCOSE: 153 mg/dL — AB (ref 70–140)
Potassium: 3.9 mEq/L (ref 3.5–5.1)
SODIUM: 144 meq/L (ref 136–145)
Total Bilirubin: 0.77 mg/dL (ref 0.20–1.20)
Total Protein: 6.4 g/dL (ref 6.4–8.3)

## 2013-11-28 MED ORDER — SODIUM CHLORIDE 0.9 % IJ SOLN
10.0000 mL | INTRAMUSCULAR | Status: DC | PRN
  Administered 2013-11-28: 10 mL
  Filled 2013-11-28: qty 10

## 2013-11-28 MED ORDER — SODIUM CHLORIDE 0.9 % IV SOLN
600.0000 mg/m2 | Freq: Once | INTRAVENOUS | Status: AC
  Administered 2013-11-28: 1400 mg via INTRAVENOUS
  Filled 2013-11-28: qty 70

## 2013-11-28 MED ORDER — ONDANSETRON 16 MG/50ML IVPB (CHCC)
16.0000 mg | Freq: Once | INTRAVENOUS | Status: AC
  Administered 2013-11-28: 16 mg via INTRAVENOUS

## 2013-11-28 MED ORDER — SODIUM CHLORIDE 0.9 % IV SOLN
Freq: Once | INTRAVENOUS | Status: AC
  Administered 2013-11-28: 12:00:00 via INTRAVENOUS

## 2013-11-28 MED ORDER — DEXAMETHASONE SODIUM PHOSPHATE 20 MG/5ML IJ SOLN
INTRAMUSCULAR | Status: AC
  Filled 2013-11-28: qty 5

## 2013-11-28 MED ORDER — DOCETAXEL CHEMO INJECTION 160 MG/16ML: 75.0000 mg/m2 | Freq: Once | INTRAVENOUS | Status: DC

## 2013-11-28 MED ORDER — ONDANSETRON 16 MG/50ML IVPB (CHCC)
INTRAVENOUS | Status: AC
  Filled 2013-11-28: qty 16

## 2013-11-28 MED ORDER — DEXAMETHASONE SODIUM PHOSPHATE 20 MG/5ML IJ SOLN
20.0000 mg | Freq: Once | INTRAMUSCULAR | Status: AC
  Administered 2013-11-28: 20 mg via INTRAVENOUS

## 2013-11-28 MED ORDER — HEPARIN SOD (PORK) LOCK FLUSH 100 UNIT/ML IV SOLN
500.0000 [IU] | Freq: Once | INTRAVENOUS | Status: AC | PRN
  Administered 2013-11-28: 500 [IU]
  Filled 2013-11-28: qty 5

## 2013-11-28 MED ORDER — SODIUM CHLORIDE 0.9 % IV SOLN
75.0000 mg/m2 | Freq: Once | INTRAVENOUS | Status: AC
  Administered 2013-11-28: 170 mg via INTRAVENOUS
  Filled 2013-11-28: qty 17

## 2013-11-28 NOTE — Progress Notes (Signed)
Hematology and Oncology Follow Up Visit  LOURDEZ MCGAHAN [id number] 1951-07-07 63 y.o. 11/29/2013 4:24 PM     Principle Diagnosis:Katarina M Tvedt 63 y.o. female with stage IA, ER/PR positive invasive ductal carcinoma of the right breast.   Prior Therapy:  #1 Patient underwent a screening mammogram and was found to have a right breast mass laterally. An ultrasound showed a 7 mm lesion at the 9:00 position 7 cm from the nipple. She was also found to have a second medial lesion at the 9:00 position 4 cm from the nipple. She underwent a biopsy of both lesions. 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 The second lesion also revealed invasive ductal carcinoma again ER positive PR positive HER-2/neu negative with a proliferation marker Ki-67 11%. She had an MRI performed that revealed 1.3. cm postbiopsy changes. She also was noted to have area of concern from anterior to posterior enhancement. This area measured 6 cm   #2 patient is status post right mastectomy done 07/12/2013. Pathology revealed:  1. Breast, simple mastectomy, Right  - INVASIVE GRADE I DUCTAL CARCINOMA, TWO FOCI MEASURING 1.2 CM AND 1.1 CM IN  1 of 4  FINAL for JUNICE, FEI ([id number])  Diagnosis(continued)  GREATEST DIMENSION.  - INTERMEDIATE GRADE DUCTAL CARCINOMA IN SITU IS PRESENT.  - LYMPH/VASCULAR INVASION IS IDENTIFIED.  - INVASIVE DUCTAL CARCINOMA IS EXTREMELY CLOSE (LESS THAN 0.1 CM) TO ANTERIOR  MARGIN.  - OTHER MARGINS ARE NEGATIVE.  - SEE ONCOLOGY TEMPLATE.  2. Lymph node, sentinel, biopsy, Right  - ONE BENIGN LYMPH WITH NO TUMOR SEEN (0/1).  - SEE COMMENT.  3. Lymph node, sentinel, biopsy, Right  - ONE BENIGN LYMPH NODE WITH NO TUMOR SEEN (0/1).  - SEE COMMENT.   #3 patient had Oncotype DX testing performed. d The patient's recurrence score is 23. Those  patients who had a recurrence score of 23 had an average rate of distant  recurrence of 14%. (JBK:ecj 08/20/2013) . This is in the intermediate risk category. The patient was recommended 4 cycles of Taxotere and Cytoxan and received this from 09/26/13 through 11/28/13.     Current therapy:  Taxotere/Cytoxan cycle 4 day 1  Interim History: LADEJA PELHAM 63 y.o. female with stage IA ER/PR positive invasive ductal carcinoma of the right breast.  She is here for evaluation prior to receiving cycle 4 of Taxotere and Cytoxan today.  She has a longstanding difficulty with constipation, and then has diarrhea with her treatments.  She takes herbs for the constipation, and it works well.  She has mild intermittent numbness in her fingertips and toes.  It doesn't interfere with her activities of daily living.  She does have nail soreness after chemotherapy that then resolves.  She does have some darkening at the base of her nailbed.  She has tried tea tree oil soaks but couldn't tolerate the smell.  Otherwise, she is doing well and a 10 point ROS is negative.  We reviewed her health maintenance below.    Medications:  Current Outpatient Prescriptions  Medication Sig Dispense Refill  . ALPRAZolam (XANAX) 0.5 MG tablet Take 0.5 mg by mouth at bedtime as needed for anxiety.       Marland Kitchen aspirin 81 MG tablet Take 81 mg by mouth daily.      . Cholecalciferol (VITAMIN D-3) 5000 UNITS TABS Take 2 each by mouth  daily.      . dexamethasone (DECADRON) 4 MG tablet Take 2 tablets (8 mg total) by mouth 2 (two) times daily with a meal. Take two times a day the day before Taxotere. Then take two times a day starting the day after chemo for 3 days.  30 tablet  1  . Flaxseed, Linseed, (FLAX SEED OIL) 1000 MG CAPS Take 2 each by mouth daily.      Marland Kitchen lidocaine-prilocaine (EMLA) cream Apply 1 application topically as needed.  30 g  1  . risperiDONE (RISPERDAL) 0.25 MG tablet Take 1 mg by mouth at bedtime.      . simvastatin (ZOCOR) 20 MG  tablet Take 20 mg by mouth every evening.      Marland Kitchen UNABLE TO FIND Apply 1 applicator topically as needed. 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 diazepam (VALIUM) 5 MG tablet Take 1 tablet (5 mg total) by mouth every 8 (eight) hours as needed for anxiety or muscle spasms.  30 tablet  0  . LORazepam (ATIVAN) 0.5 MG tablet Take 1 tablet (0.5 mg total) by mouth every 6 (six) hours as needed (Nausea or vomiting).  30 tablet  0  . ondansetron (ZOFRAN) 8 MG tablet Take 1 tablet (8 mg total) by mouth 2 (two) times daily. Take two times a day starting the day after chemo for 3 days. Then take two times a day as needed for nausea or vomiting.  30 tablet  1  . oxyCODONE (OXY IR/ROXICODONE) 5 MG immediate release tablet Take 1 tablet (5 mg total) by mouth every 4 (four) hours as needed for moderate pain.  30 tablet  0  . oxyCODONE-acetaminophen (PERCOCET) 10-325 MG per tablet Take 1 tablet by mouth every 6 (six) hours as needed for pain.  20 tablet  0  . prochlorperazine (COMPAZINE) 10 MG tablet Take 1 tablet (10 mg total) by mouth every 6 (six) hours as needed (Nausea or vomiting).  30 tablet  1   No current facility-administered medications for this visit.     Allergies:  Allergies  Allergen Reactions  . Codeine Nausea And Vomiting  . Multihance [Gadobenate] Nausea Only and Cough    PT HAD INCREASED MUCOUS PRODUCTION AND PHLEGMY COUGH LASTING 10 MINS AFTER INJECTION, PT SENT HOME WITH BENADRYL, PREV NAUSEA AFTER GAD    Medical History: Past Medical History  Diagnosis Date  . Breast cancer   . Anxiety   . Depression     pt. remarks that she "takes respridal because if I don't take it I get angry"   . GERD (gastroesophageal reflux disease)     pt. uses vinegar for heartburn, ususally once per week   . Arthritis     hip- R, back   . Hypertension     "when I get upset"  Not on medication  . Stroke ?2010 or 2011    2010, [facility] for stroke "mini  stroke" short term memory problems since  . Anemia     when she was younger  . Constipation   . Complication of anesthesia   . PONV (postoperative nausea and vomiting)     "just for one day"    Surgical History:  Past Surgical History  Procedure Laterality Date  . Cholecystectomy    . Breast surgery      for abcesses- removed from both breasts, many yrs. ago  . Mastectomy w/ sentinel node biopsy Right 07/12/2013  Procedure: MASTECTOMY WITH SENTINEL LYMPH NODE BIOPSY;  Surgeon: Rolm Bookbinder, MD;  Location: [place];  Service: General;  Laterality: Right;  . Breast reconstruction with placement of tissue expander and flex hd (acellular hydrated dermis) Right 07/12/2013    Procedure: RIGHT BREAST RECONSTRUCTION WITH PLACEMENT OF TISSUE EXPANDER AND FLEX HD TO RIGHT BREAST (ACELLULAR HYDRATED DERMIS);  Surgeon: Irene Limbo, MD;  Location: [place];  Service: Plastics;  Laterality: Right;  . Vaginal hysterectomy    . Colonoscopy    . Mastectomy    . Tubal ligation    . Dilation and curettage of uterus    . Portacath placement N/A 09/16/2013    Procedure: INSERTION PORT-A-CATH;  Surgeon: Rolm Bookbinder, MD;  Location: [place];  Service: General;  Laterality: N/A;     Review of Systems: A 10 point review of systems was conducted and is otherwise negative except for what is noted above.    Health Maintenance  Mammogram: 05/21/2013 Colonoscopy: due now, will wait until chemotherapy is completed Bone Density Scan: 2012 Pap Smear: s/p TAH, ovaries remain Eye Exam: 2 years ago, due now Vitamin D Level: 11/07/13 86 Lipid Panel: 1 year ago, normal   Physical Exam: Blood pressure 139/79, pulse 83, temperature 98.3 F (36.8 C), temperature source Oral, resp. rate 20, height 5' 9"  (1.753 m), weight 234 lb 14.4 oz (106.55 kg). GENERAL: Patient is a well appearing female in no acute distress HEENT:  Sclerae anicteric.  Oropharynx clear and moist. No ulcerations or evidence of  oropharyngeal candidiasis. Neck is supple.  NODES:  No cervical, supraclavicular, or axillary lymphadenopathy palpated.  BREAST EXAM:  Deferred. LUNGS:  Clear to auscultation bilaterally.  No wheezes or rhonchi. HEART:  Regular rate and rhythm. No murmur appreciated. ABDOMEN:  Soft, nontender.  Positive, normoactive bowel sounds. No organomegaly palpated. MSK:  No focal spinal tenderness to palpation. Full range of motion bilaterally in the upper extremities. EXTREMITIES:  No peripheral edema.   SKIN:  Clear with no obvious rashes or skin changes. No nail dyscrasia. NEURO:  Nonfocal. Well oriented.  Appropriate affect. ECOG PERFORMANCE STATUS: 1 - Symptomatic but completely ambulatory   Lab Results: Lab Results  Component Value Date   WBC 6.3 11/28/2013   HGB 11.3* 11/28/2013   HCT 34.5* 11/28/2013   MCV 89.5 11/28/2013   PLT 218 11/28/2013     Chemistry      Component Value Date/Time   NA 144 11/28/2013 0854   NA 146 09/13/2013 1200   K 3.9 11/28/2013 0854   K 4.5 09/13/2013 1200   CL 108 09/13/2013 1200   CO2 24 11/28/2013 0854   CO2 24 09/13/2013 1200   BUN 12.8 11/28/2013 0854   BUN 13 09/13/2013 1200   CREATININE 0.7 11/28/2013 0854   CREATININE 0.67 09/13/2013 1200      Component Value Date/Time   CALCIUM 9.5 11/28/2013 0854   CALCIUM 9.6 09/13/2013 1200   ALKPHOS 88 11/28/2013 0854   AST 18 11/28/2013 0854   ALT 23 11/28/2013 0854   BILITOT 0.77 11/28/2013 0854     Assessment and Plan: Kathlen Brunswick 63 y.o. female with  1. Stage IA ER/PR positive invasive ductal carcinoma of the right breast.  She underwent right mastectomy and an oncotype was sent that revealed a 14% risk of distant recurrence with Tamoxifen only, putting her in the intermediate risk category.  She was recommended 4 cycles of adjuvant chemotherapy with Taxotere and Cytoxan, given on day 1 of a 21  day cycle with Neulasta given on day 2 for granulocyte support.  She is here for cycle 4 day 1 of treatment today.  I reviewed her labs  with her in detail.  They are stable.  She will proceed with chemotherapy today.    2.  Health maintenance:  The patient is due for a bone density exam.  In light of her starting on anti-estrogen therapy in the near future I will order this today.  She will undergo a colonoscopy after the completion of chemotherapy.  I recommended she have an eye exam in about two months.  Everything else is up to date.    The patient will return to clinic tomorrow for Neulasta and in one week for labs and an office visit.  She knows to contact us in the interim with any questions or concerns.  We can certainly see her sooner if needed.    I spent 25 minutes counseling the patient face to face.  The total time spent in the appointment was 30 minutes.  Minette Headland, [place] [phone] 11/29/2013 4:24 PM

## 2013-11-28 NOTE — Patient Instructions (Signed)
You are doing well today.  Proceed with your final cycle of Taxotere/Cytoxan.  I have ordered a bone density exam as it is due.  Please call us if you have any questions or concerns.    Bone Densitometry Bone densitometry is a special X-ray that measures your bone density and can be used to help predict your risk of bone fractures. This test is used to determine bone mineral content and density to diagnose osteoporosis. Osteoporosis is the loss of bone that may cause the bone to become weak. Osteoporosis commonly occurs in women entering menopause. However, it may be found in men and in people with other diseases. PREPARATION FOR TEST No preparation necessary. WHO SHOULD BE TESTED?  All women older than 60.  Postmenopausal women (50 to 63) with risk factors for osteoporosis.  People with a previous fracture caused by normal activities.  People with a small body frame (less than 127 poundsor a body mass index [BMI] of less than 21).  People who have a parent with a hip fracture or history of osteoporosis.  People who smoke.  People who have rheumatoid arthritis.  Anyone who engages in excessive alcohol use (more than 3 drinks most days).  Women who experience early menopause. WHEN SHOULD YOU BE RETESTED? Current guidelines suggest that you should wait at least 2 years before doing a bone density test again if your first test was normal.Recent studies indicated that women with normal bone density may be able to wait a few years before needing to repeat a bone density test. You should discuss this with your caregiver.  NORMAL FINDINGS   Normal: less than standard deviation below normal (greater than -1).  Osteopenia: 1 to 2.5 standard deviations below normal (-1 to -2.5).  Osteoporosis: greater than 2.5 standard deviations below normal (less than -2.5). Test results are reported as a "T score" and a "Z score."The T score is a number that compares your bone density with the bone  density of healthy, young women.The Z score is a number that compares your bone density with the scores of women who are the same age, gender, and race.  Ranges for normal findings may vary among different laboratories and hospitals. You should always check with your doctor after having lab work or other tests done to discuss the meaning of your test results and whether your values are considered within normal limits. MEANING OF TEST  Your caregiver will go over the test results with you and discuss the importance and meaning of your results, as well as treatment options and the need for additional tests if necessary. OBTAINING THE TEST RESULTS It is your responsibility to obtain your test results. Ask the lab or department performing the test when and how you will get your results. Document Released: 08/30/2004 Document Revised: 10/31/2011 Document Reviewed: 09/22/2010 [facility] Patient Information 2014 [place].

## 2013-11-29 ENCOUNTER — Telehealth: Payer: Self-pay | Admitting: Oncology

## 2013-11-29 ENCOUNTER — Ambulatory Visit (HOSPITAL_BASED_OUTPATIENT_CLINIC_OR_DEPARTMENT_OTHER): Payer: PRIVATE HEALTH INSURANCE

## 2013-11-29 VITALS — BP 119/51 | HR 66 | Temp 97.9°F

## 2013-11-29 DIAGNOSIS — C50411 Malignant neoplasm of upper-outer quadrant of right female breast: Secondary | ICD-10-CM

## 2013-11-29 DIAGNOSIS — C50419 Malignant neoplasm of upper-outer quadrant of unspecified female breast: Secondary | ICD-10-CM

## 2013-11-29 DIAGNOSIS — Z5189 Encounter for other specified aftercare: Secondary | ICD-10-CM

## 2013-11-29 MED ORDER — PEGFILGRASTIM INJECTION 6 MG/0.6ML
6.0000 mg | Freq: Once | SUBCUTANEOUS | Status: AC
  Administered 2013-11-29: 6 mg via SUBCUTANEOUS
  Filled 2013-11-29: qty 0.6

## 2013-11-29 NOTE — Patient Instructions (Signed)

## 2013-11-29 NOTE — Telephone Encounter (Signed)
s.w.l pt and advised on bone density....pt ok and aware

## 2013-12-03 ENCOUNTER — Ambulatory Visit
Admission: RE | Admit: 2013-12-03 | Discharge: 2013-12-03 | Disposition: A | Discharge status: Home / Self Care | Payer: 59 | Source: Ambulatory Visit | Attending: Adult Health | Admitting: Adult Health

## 2013-12-03 DIAGNOSIS — E2839 Other primary ovarian failure: Secondary | ICD-10-CM

## 2013-12-05 ENCOUNTER — Telehealth: Payer: Self-pay | Admitting: Oncology

## 2013-12-05 ENCOUNTER — Other Ambulatory Visit: Payer: Self-pay

## 2013-12-05 ENCOUNTER — Telehealth: Payer: Self-pay | Admitting: *Deleted

## 2013-12-05 ENCOUNTER — Other Ambulatory Visit: Payer: 59

## 2013-12-05 ENCOUNTER — Encounter: Payer: Self-pay | Admitting: Oncology

## 2013-12-05 ENCOUNTER — Ambulatory Visit (HOSPITAL_BASED_OUTPATIENT_CLINIC_OR_DEPARTMENT_OTHER): Payer: 59 | Admitting: Oncology

## 2013-12-05 VITALS — BP 105/74 | HR 87 | Temp 97.8°F | Resp 18 | Ht 69.0 in | Wt 229.8 lb

## 2013-12-05 DIAGNOSIS — C50419 Malignant neoplasm of upper-outer quadrant of unspecified female breast: Secondary | ICD-10-CM

## 2013-12-05 DIAGNOSIS — Z170 Estrogen receptor positive status [ER+]: Secondary | ICD-10-CM

## 2013-12-05 DIAGNOSIS — C50411 Malignant neoplasm of upper-outer quadrant of right female breast: Secondary | ICD-10-CM

## 2013-12-05 LAB — COMPREHENSIVE METABOLIC PANEL (CC13)
ALBUMIN: 3.4 g/dL — AB (ref 3.5–5.0)
ALT: 29 U/L (ref 0–55)
AST: 19 U/L (ref 5–34)
Alkaline Phosphatase: 113 U/L (ref 40–150)
Anion Gap: 8 mEq/L (ref 3–11)
BUN: 8.5 mg/dL (ref 7.0–26.0)
CALCIUM: 9.1 mg/dL (ref 8.4–10.4)
CHLORIDE: 107 meq/L (ref 98–109)
CO2: 25 mEq/L (ref 22–29)
Creatinine: 0.7 mg/dL (ref 0.6–1.1)
Glucose: 140 mg/dl (ref 70–140)
Potassium: 3.6 mEq/L (ref 3.5–5.1)
Sodium: 139 mEq/L (ref 136–145)
Total Bilirubin: 0.75 mg/dL (ref 0.20–1.20)
Total Protein: 5.8 g/dL — ABNORMAL LOW (ref 6.4–8.3)

## 2013-12-05 LAB — CBC WITH DIFFERENTIAL/PLATELET
BASO%: 0.7 % (ref 0.0–2.0)
BASOS ABS: 0.1 10*3/uL (ref 0.0–0.1)
EOS%: 0.9 % (ref 0.0–7.0)
Eosinophils Absolute: 0.1 10*3/uL (ref 0.0–0.5)
HCT: 34.9 % (ref 34.8–46.6)
HEMOGLOBIN: 11.4 g/dL — AB (ref 11.6–15.9)
LYMPH#: 1.6 10*3/uL (ref 0.9–3.3)
LYMPH%: 20.3 % (ref 14.0–49.7)
MCH: 29.4 pg (ref 25.1–34.0)
MCHC: 32.8 g/dL (ref 31.5–36.0)
MCV: 89.6 fL (ref 79.5–101.0)
MONO#: 1 10*3/uL — AB (ref 0.1–0.9)
MONO%: 13 % (ref 0.0–14.0)
NEUT#: 5 10*3/uL (ref 1.5–6.5)
NEUT%: 65.1 % (ref 38.4–76.8)
Platelets: 205 10*3/uL (ref 145–400)
RBC: 3.89 10*6/uL (ref 3.70–5.45)
RDW: 16.7 % — AB (ref 11.2–14.5)
WBC: 7.6 10*3/uL (ref 3.9–10.3)

## 2013-12-05 NOTE — Telephone Encounter (Signed)
Called pt to inform her of bone density results. Pt was not home. I left a detailed message that results were normal and she can call me back at [phone] if she has any questions. Message to be forwarded to Charlestine Massed, NP.

## 2013-12-05 NOTE — Telephone Encounter (Signed)
Message copied by Harmon Pier on Thu Dec 05, 2013  9:22 AM ------      Message from: Minette Headland      Created: Wed Dec 04, 2013  9:18 PM       Please let patient know that bone density was normal.            Thanks,       LC       ----- Message -----         From: Rad Results In Interface         Sent: 12/03/2013  12:00 PM           To: Minette Headland, NP                   ------

## 2013-12-05 NOTE — Telephone Encounter (Signed)
, °

## 2013-12-05 NOTE — Progress Notes (Signed)
[facility] OFFICE PROGRESS NOTE  Patient Care Team: [name], PA-C as PCP - General ([place]) Amada Kingfisher, MD as Consulting Physician (Hematology and Oncology) Dr. Rolm Bookbinder  Dr. Thea Silversmith  DIAGNOSIS: 63 year old female with diagnosis of right breast cancer   STAGE:  Breast cancer of upper-outer quadrant of right female breast  Primary site: Breast (Right)  Staging method: AJCC 7th Edition  Clinical: Stage IA (T1c, N0, cM0)  Summary: Stage IA (T1c, N0, cM0)   SUMMARY OF ONCOLOGIC HISTORY: #1 Patient underwent a screening mammogram and was found to have a right breast mass laterally. An ultrasound showed a 7 mm lesion at the 9:00 position 7 cm from the nipple. She was also found to have a second medial lesion at the 9:00 position 4 cm from the nipple. She underwent a biopsy of both lesions. 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 The second lesion also revealed invasive ductal carcinoma again ER positive PR positive HER-2/neu negative with a proliferation marker Ki-67 11%. She had an MRI performed that revealed 1.3. cm postbiopsy changes. She also was noted to have area of concern from anterior to posterior enhancement. This area measured 6 cm  #2 patient is status post right mastectomy done 07/12/2013. Pathology revealed:  1. Breast, simple mastectomy, Right  - INVASIVE GRADE I DUCTAL CARCINOMA, TWO FOCI MEASURING 1.2 CM AND 1.1 CM IN  1 of 4  FINAL for Morgan Santos ([id number])  Diagnosis(continued)  GREATEST DIMENSION.  - INTERMEDIATE GRADE DUCTAL CARCINOMA IN SITU IS PRESENT.  - LYMPH/VASCULAR INVASION IS IDENTIFIED.  - INVASIVE DUCTAL CARCINOMA IS EXTREMELY CLOSE (LESS THAN 0.1 CM) TO ANTERIOR  MARGIN.  - OTHER MARGINS ARE NEGATIVE.  - SEE ONCOLOGY TEMPLATE.  2. Lymph node,  sentinel, biopsy, Right  - ONE BENIGN LYMPH WITH NO TUMOR SEEN (0/1).  - SEE COMMENT.  3. Lymph node, sentinel, biopsy, Right  - ONE BENIGN LYMPH NODE WITH NO TUMOR SEEN (0/1).  - SEE COMMENT.  #3 patient had Oncotype DX testing performed.  The patient's recurrence  score is 23. Those patients who had a recurrence score of 23 had an average rate of distant  recurrence of 14%. (JBK:ecj 08/20/2013) . This is in the intermediate risk category.   #4 status post adjuvant Taxotere Cytoxan x4 cycles administered from 09/26/2013 through 11/28/2013. Patient did receive day 2 Neulasta with each of her cycles. She tolerated treatments very well no adverse events were noted during her therapy.  CURRENT THERAPY: Proceed with reconstruction   INTERVAL HISTORY: Morgan Santos 63 y.o. female returns for followup visit today. Her last dose of chemotherapy was on 11/28/2013. She seems to have tolerated the final cycle very well without any problems. Her blood counts look terrific today. She is gong to be seeing her plastic surgeon next week to discuss timings of went to have her implant exchanged. She currently has expander in the right breast. She feels well. She does tell me that her taste buds are back at. But she knows that in a few weeks she'll feel well. Her tumor was estrogen receptor positive so therefore she will need antiestrogen therapy. But we will hold for about a month to let her recover from the chemotherapy and the implant exchange as well. She's denying any headaches double vision blurring of vision fevers chills night  sweats no shortness of breath chest pains or palpitations no myalgias and arthralgias. Remainder of the 10 point review of system is negative and as noted below.  Past Medical History  Diagnosis Date  . Breast cancer   . Anxiety   . Depression     pt. remarks that she "takes respridal because if I don't take it I get angry"   . GERD (gastroesophageal reflux disease)     pt. uses  vinegar for heartburn, ususally once per week   . Arthritis     hip- R, back   . Hypertension     "when I get upset"  Not on medication  . Stroke ?2010 or 2011    2010, [facility] for stroke "mini stroke" short term memory problems since  . Anemia     when she was younger  . Constipation   . Complication of anesthesia   . PONV (postoperative nausea and vomiting)     "just for one day"   Past Surgical History  Procedure Laterality Date  . Cholecystectomy    . Breast surgery      for abcesses- removed from both breasts, many yrs. ago  . Mastectomy w/ sentinel node biopsy Right 07/12/2013    Procedure: MASTECTOMY WITH SENTINEL LYMPH NODE BIOPSY;  Surgeon: Rolm Bookbinder, MD;  Location: [place];  Service: General;  Laterality: Right;  . Breast reconstruction with placement of tissue expander and flex hd (acellular hydrated dermis) Right 07/12/2013    Procedure: RIGHT BREAST RECONSTRUCTION WITH PLACEMENT OF TISSUE EXPANDER AND FLEX HD TO RIGHT BREAST (ACELLULAR HYDRATED DERMIS);  Surgeon: Irene Limbo, MD;  Location: [place];  Service: Plastics;  Laterality: Right;  . Vaginal hysterectomy    . Colonoscopy    . Mastectomy    . Tubal ligation    . Dilation and curettage of uterus    . Portacath placement N/A 09/16/2013    Procedure: INSERTION PORT-A-CATH;  Surgeon: Rolm Bookbinder, MD;  Location: [place];  Service: General;  Laterality: N/A;   History   Social History  . Marital Status: Married    Spouse Name: N/A    Number of Children: N/A  . Years of Education: N/A   Occupational History  . Not on file.   Social History Main Topics  . Smoking status: Former Smoker    Quit date: 09/14/1995  . Smokeless tobacco: Never Used  . Alcohol Use: Yes     Comment: once q 3 months  . Drug Use: Yes    Special: Marijuana     Comment: in the past  . Sexual Activity: Yes   Other Topics Concern  . Not on file   Social History Narrative  . No narrative on file      ALLERGIES:  is allergic to codeine and multihance.  MEDICATIONS:  Current Outpatient Prescriptions  Medication Sig Dispense Refill  . ALPRAZolam (XANAX) 0.5 MG tablet Take 0.5 mg by mouth at bedtime as needed for anxiety.       Marland Kitchen aspirin 81 MG tablet Take 81 mg by mouth daily.      . Cholecalciferol (VITAMIN D-3) 5000 UNITS TABS Take 2 each by mouth daily.      . Flaxseed, Linseed, (FLAX SEED OIL) 1000 MG CAPS Take 2 each by mouth daily.      Marland Kitchen lidocaine-prilocaine (EMLA) cream Apply 1 application topically as needed.  30 g  1  . LORazepam (ATIVAN) 0.5 MG tablet Take 1 tablet (0.5 mg total) by  mouth every 6 (six) hours as needed (Nausea or vomiting).  30 tablet  0  . ondansetron (ZOFRAN) 8 MG tablet Take 1 tablet (8 mg total) by mouth 2 (two) times daily. Take two times a day starting the day after chemo for 3 days. Then take two times a day as needed for nausea or vomiting.  30 tablet  1  . prochlorperazine (COMPAZINE) 10 MG tablet Take 1 tablet (10 mg total) by mouth every 6 (six) hours as needed (Nausea or vomiting).  30 tablet  1  . risperiDONE (RISPERDAL) 0.25 MG tablet Take 1 mg by mouth at bedtime.      . simvastatin (ZOCOR) 20 MG tablet Take 20 mg by mouth every evening.      Marland Kitchen UNABLE TO FIND Apply 1 applicator topically as needed. 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 dexamethasone (DECADRON) 4 MG tablet Take 2 tablets (8 mg total) by mouth 2 (two) times daily with a meal. Take two times a day the day before Taxotere. Then take two times a day starting the day after chemo for 3 days.  30 tablet  1  . diazepam (VALIUM) 5 MG tablet Take 1 tablet (5 mg total) by mouth every 8 (eight) hours as needed for anxiety or muscle spasms.  30 tablet  0  . oxyCODONE (OXY IR/ROXICODONE) 5 MG immediate release tablet Take 1 tablet (5 mg total) by mouth every 4 (four) hours as needed for moderate pain.  30 tablet  0  . oxyCODONE-acetaminophen  (PERCOCET) 10-325 MG per tablet Take 1 tablet by mouth every 6 (six) hours as needed for pain.  20 tablet  0   No current facility-administered medications for this visit.    REVIEW OF SYSTEMS:   Constitutional: Denies fevers, chills or abnormal weight loss Eyes: Denies blurriness of vision Ears, nose, mouth, throat, and face: Denies mucositis or sore throat Respiratory: Denies cough, dyspnea or wheezes Cardiovascular: Denies palpitation, chest discomfort or lower extremity swelling Gastrointestinal:  Denies nausea, heartburn or change in bowel habits Skin: Denies abnormal skin rashes Lymphatics: Denies new lymphadenopathy or easy bruising Neurological:Denies numbness, tingling or new weaknesses Behavioral/Psych: Mood is stable, no new changes  All other systems were reviewed with the patient and are negative.  PHYSICAL EXAMINATION: ECOG PERFORMANCE STATUS: 0 - Asymptomatic  Filed Vitals:   12/05/13 0806  BP: 105/74  Pulse: 87  Temp: 97.8 F (36.6 C)  Resp: 18   Filed Weights   12/05/13 0806  Weight: 229 lb 12.8 oz (104.237 kg)    GENERAL:alert, no distress and comfortable SKIN: skin color, texture, turgor are normal, no rashes or significant lesions EYES: normal, Conjunctiva are pink and non-injected, sclera clear OROPHARYNX:no exudate, no erythema and lips, buccal mucosa, and tongue normal  NECK: supple, thyroid normal size, non-tender, without nodularity LYMPH:  no palpable lymphadenopathy in the cervical, axillary or inguinal LUNGS: clear to auscultation and percussion with normal breathing effort HEART: regular rate & rhythm and no murmurs and no lower extremity edema ABDOMEN:abdomen soft, non-tender and normal bowel sounds Musculoskeletal:no cyanosis of digits and no clubbing  NEURO: alert & oriented x 3 with fluent speech, no focal motor/sensory deficits Right expander: Healing well no evidence of infections. Left breast no masses or nipple discharge no skin  changes.  LABORATORY DATA:  I have reviewed the data as listed    Component Value Date/Time   NA 139 12/05/2013 0803   NA 146  09/13/2013 1200   K 3.6 12/05/2013 0803   K 4.5 09/13/2013 1200   CL 108 09/13/2013 1200   CO2 25 12/05/2013 0803   CO2 24 09/13/2013 1200   GLUCOSE 140 12/05/2013 0803   GLUCOSE 96 09/13/2013 1200   BUN 8.5 12/05/2013 0803   BUN 13 09/13/2013 1200   CREATININE 0.7 12/05/2013 0803   CREATININE 0.67 09/13/2013 1200   CALCIUM 9.1 12/05/2013 0803   CALCIUM 9.6 09/13/2013 1200   PROT 5.8* 12/05/2013 0803   ALBUMIN 3.4* 12/05/2013 0803   AST 19 12/05/2013 0803   ALT 29 12/05/2013 0803   ALKPHOS 113 12/05/2013 0803   BILITOT 0.75 12/05/2013 0803   GFRNONAA >90 09/13/2013 1200   GFRAA >90 09/13/2013 1200    No results found for this basename: SPEP, UPEP,  kappa and lambda light chains    Lab Results  Component Value Date   WBC 7.6 12/05/2013   NEUTROABS 5.0 12/05/2013   HGB 11.4* 12/05/2013   HCT 34.9 12/05/2013   MCV 89.6 12/05/2013   PLT 205 12/05/2013      Chemistry      Component Value Date/Time   NA 139 12/05/2013 0803   NA 146 09/13/2013 1200   K 3.6 12/05/2013 0803   K 4.5 09/13/2013 1200   CL 108 09/13/2013 1200   CO2 25 12/05/2013 0803   CO2 24 09/13/2013 1200   BUN 8.5 12/05/2013 0803   BUN 13 09/13/2013 1200   CREATININE 0.7 12/05/2013 0803   CREATININE 0.67 09/13/2013 1200      Component Value Date/Time   CALCIUM 9.1 12/05/2013 0803   CALCIUM 9.6 09/13/2013 1200   ALKPHOS 113 12/05/2013 0803   AST 19 12/05/2013 0803   ALT 29 12/05/2013 0803   BILITOT 0.75 12/05/2013 0803       RADIOGRAPHIC STUDIES: I have personally reviewed the radiological images as listed and agreed with the findings in the report. No results found.    ASSESSMENT & PLAN:  64 year old female with  #1 stage I (T1 N0) invasive ductal carcinoma of the right breast. Patient is status post mastectomy with sentinel lymph node biopsy. The final pathology did reveal 2 foci of disease one  measuring 1.1 cm and the second measuring 1.2 cm. Tumor was ER positive PR positive HER-2/neu negative with a proliferation marker Ki-67 11%. Sentinel node was negative for metastatic disease. Oncotype testing performed reveals a risk of 14% of the tamoxifen only. This was in the intermediate risk category. She went on to receive 4 cycles of adjuvant chemotherapy consisting of Taxotere Cytoxan every 3 weeks for a total of 4 cycles. She has not completed her chemotherapy. Her last cycle was given on 11/28/2013. Overall she tolerated it well without any problems.  #2 right mastectomy: Patient has an expander in she will have exchange for an implant for the next few weeks. She has an appointment set up with her plastic surgeon. Patient also has a Port-A-Cath in. Certainly she can have his port removed at the time of the implant exchange.  #3 adjuvant antiestrogen therapy: Patient is postmenopausal we did discuss use of aromatase inhibitors such as arimidex1 mg daily for a total of 5 years. However I will not start her on this jackets. She will be seeing me back in May after she has her implant in. She understands risks benefits and side effects of that treatment.  #4 Port-A-Cath: At the time of patient's implant exchange she can have her port removed.  #  5 followup patient will be seen by towards end of May at that time we will plan on getting her started on an aromatase inhibitor such as Arimidex 1 mg daily  Orders Placed This Encounter  Procedures  . CBC with Differential    Standing Status: Future     Number of Occurrences:      Standing Expiration Date: 12/05/2014  . Comprehensive metabolic panel (Cmet) - [facility]    Standing Status: Future     Number of Occurrences:      Standing Expiration Date: 12/05/2014   All questions were answered. The patient knows to call the clinic with any problems, questions or concerns. No barriers to learning was detected. I spent 20 minutes counseling the patient face  to face. The total time spent in the appointment was 30 minutes and more than 50% was on counseling and review of test results     Deatra Robinson, MD 12/05/2013 11:08 AM

## 2013-12-05 NOTE — Progress Notes (Signed)
Advised pt port will need to be flushed every 6-8 weeks when not being used to maintain patency.   POF entered.  Patient voiced understanding.

## 2013-12-06 ENCOUNTER — Telehealth: Payer: Self-pay | Admitting: Oncology

## 2013-12-06 NOTE — Telephone Encounter (Signed)
s.w. pt and advised on May appts....pt ok and aware °

## 2013-12-19 ENCOUNTER — Ambulatory Visit: Payer: PRIVATE HEALTH INSURANCE

## 2013-12-21 ENCOUNTER — Other Ambulatory Visit: Payer: Self-pay | Admitting: Oncology

## 2013-12-23 ENCOUNTER — Telehealth: Payer: Self-pay

## 2013-12-23 NOTE — Telephone Encounter (Signed)
FAXED DENIAL FOR LORAZEPAM TO [facility]. PT. IS AWARE OF THE LORAZEPAM DENIAL.

## 2013-12-26 ENCOUNTER — Other Ambulatory Visit: Payer: PRIVATE HEALTH INSURANCE

## 2013-12-26 ENCOUNTER — Ambulatory Visit (HOSPITAL_BASED_OUTPATIENT_CLINIC_OR_DEPARTMENT_OTHER): Payer: 59

## 2014-01-06 ENCOUNTER — Telehealth: Payer: Self-pay | Admitting: Oncology

## 2014-01-06 NOTE — Telephone Encounter (Signed)
, °

## 2014-01-07 ENCOUNTER — Encounter (HOSPITAL_BASED_OUTPATIENT_CLINIC_OR_DEPARTMENT_OTHER): Payer: Self-pay | Admitting: *Deleted

## 2014-01-07 NOTE — Progress Notes (Signed)
Does not need more labs -bring all meds-

## 2014-01-14 ENCOUNTER — Encounter (HOSPITAL_BASED_OUTPATIENT_CLINIC_OR_DEPARTMENT_OTHER): Payer: 59 | Admitting: Anesthesiology

## 2014-01-14 ENCOUNTER — Ambulatory Visit (HOSPITAL_BASED_OUTPATIENT_CLINIC_OR_DEPARTMENT_OTHER): Payer: 59 | Admitting: Anesthesiology

## 2014-01-14 ENCOUNTER — Encounter (HOSPITAL_BASED_OUTPATIENT_CLINIC_OR_DEPARTMENT_OTHER): Payer: Self-pay | Admitting: *Deleted

## 2014-01-14 ENCOUNTER — Ambulatory Visit (HOSPITAL_BASED_OUTPATIENT_CLINIC_OR_DEPARTMENT_OTHER)
Admission: RE | Admit: 2014-01-14 | Discharge: 2014-01-14 | Disposition: A | Discharge status: Home / Self Care | Payer: 59 | Source: Ambulatory Visit | Attending: Plastic Surgery | Admitting: Plastic Surgery

## 2014-01-14 ENCOUNTER — Encounter (HOSPITAL_BASED_OUTPATIENT_CLINIC_OR_DEPARTMENT_OTHER)
Admission: RE | Disposition: A | Discharge status: Home / Self Care | Payer: Self-pay | Source: Ambulatory Visit | Attending: Plastic Surgery

## 2014-01-14 DIAGNOSIS — Z4430 Encounter for fitting and adjustment of external breast prosthesis, unspecified breast: Secondary | ICD-10-CM | POA: Insufficient documentation

## 2014-01-14 DIAGNOSIS — Z853 Personal history of malignant neoplasm of breast: Secondary | ICD-10-CM | POA: Insufficient documentation

## 2014-01-14 DIAGNOSIS — Z9221 Personal history of antineoplastic chemotherapy: Secondary | ICD-10-CM | POA: Diagnosis not present

## 2014-01-14 HISTORY — PX: PORT-A-CATH REMOVAL: SHX5289

## 2014-01-14 HISTORY — PX: MASTOPEXY: SHX5358

## 2014-01-14 HISTORY — PX: REMOVAL OF BILATERAL TISSUE EXPANDERS WITH PLACEMENT OF BILATERAL BREAST IMPLANTS: SHX6431

## 2014-01-14 SURGERY — REMOVAL, TISSUE EXPANDER, BREAST, BILATERAL, WITH BILATERAL IMPLANT IMPLANT INSERTION
Anesthesia: General | Site: Chest | Laterality: Right

## 2014-01-14 MED ORDER — SCOPOLAMINE 1 MG/3DAYS TD PT72
MEDICATED_PATCH | TRANSDERMAL | Status: DC | PRN
  Administered 2014-01-14: 1 via TRANSDERMAL

## 2014-01-14 MED ORDER — SUCCINYLCHOLINE CHLORIDE 20 MG/ML IJ SOLN
INTRAMUSCULAR | Status: DC | PRN
  Administered 2014-01-14: 100 mg via INTRAVENOUS

## 2014-01-14 MED ORDER — SULFAMETHOXAZOLE-TMP DS 800-160 MG PO TABS: 1.0000 | ORAL_TABLET | Freq: Two times a day (BID) | ORAL | Status: DC

## 2014-01-14 MED ORDER — PHENYLEPHRINE HCL 10 MG/ML IJ SOLN
INTRAMUSCULAR | Status: DC | PRN
  Administered 2014-01-14 (×2): 40 ug via INTRAVENOUS

## 2014-01-14 MED ORDER — HYDROMORPHONE HCL PF 1 MG/ML IJ SOLN
INTRAMUSCULAR | Status: AC
  Filled 2014-01-14: qty 1

## 2014-01-14 MED ORDER — FENTANYL CITRATE 0.05 MG/ML IJ SOLN: 50.0000 ug | INTRAMUSCULAR | Status: DC | PRN

## 2014-01-14 MED ORDER — FENTANYL CITRATE 0.05 MG/ML IJ SOLN
INTRAMUSCULAR | Status: AC
  Filled 2014-01-14: qty 12

## 2014-01-14 MED ORDER — ONDANSETRON HCL 4 MG/2ML IJ SOLN
INTRAMUSCULAR | Status: DC | PRN
  Administered 2014-01-14 (×2): 4 mg via INTRAVENOUS

## 2014-01-14 MED ORDER — GLYCOPYRROLATE 0.2 MG/ML IJ SOLN
INTRAMUSCULAR | Status: DC | PRN
  Administered 2014-01-14: 0.2 mg via INTRAVENOUS

## 2014-01-14 MED ORDER — LIDOCAINE HCL (CARDIAC) 10 MG/ML IV SOLN
INTRAVENOUS | Status: DC | PRN
  Administered 2014-01-14: 60 mg via INTRAVENOUS

## 2014-01-14 MED ORDER — MIDAZOLAM HCL 5 MG/5ML IJ SOLN
INTRAMUSCULAR | Status: DC | PRN
  Administered 2014-01-14: 2 mg via INTRAVENOUS

## 2014-01-14 MED ORDER — SODIUM CHLORIDE 0.9 % IV SOLN
INTRAVENOUS | Status: DC | PRN
  Administered 2014-01-14: 1000 mL via INTRAMUSCULAR

## 2014-01-14 MED ORDER — MIDAZOLAM HCL 2 MG/2ML IJ SOLN
INTRAMUSCULAR | Status: AC
  Filled 2014-01-14: qty 2

## 2014-01-14 MED ORDER — HYDROMORPHONE HCL PF 1 MG/ML IJ SOLN
0.2500 mg | INTRAMUSCULAR | Status: DC | PRN
  Administered 2014-01-14: 0.25 mg via INTRAVENOUS
  Administered 2014-01-14: 0.5 mg via INTRAVENOUS

## 2014-01-14 MED ORDER — ONDANSETRON HCL 4 MG/2ML IJ SOLN: 4.0000 mg | Freq: Once | INTRAMUSCULAR | Status: DC | PRN

## 2014-01-14 MED ORDER — CEFAZOLIN SODIUM-DEXTROSE 2-3 GM-% IV SOLR
2.0000 g | INTRAVENOUS | Status: AC
  Administered 2014-01-14: 2 g via INTRAVENOUS

## 2014-01-14 MED ORDER — LIDOCAINE-EPINEPHRINE 1 %-1:100000 IJ SOLN
INTRAMUSCULAR | Status: AC
  Filled 2014-01-14: qty 1

## 2014-01-14 MED ORDER — CEFAZOLIN SODIUM-DEXTROSE 2-3 GM-% IV SOLR
INTRAVENOUS | Status: AC
  Filled 2014-01-14: qty 50

## 2014-01-14 MED ORDER — FENTANYL CITRATE 0.05 MG/ML IJ SOLN
INTRAMUSCULAR | Status: DC | PRN
  Administered 2014-01-14: 50 ug via INTRAVENOUS
  Administered 2014-01-14: 25 ug via INTRAVENOUS
  Administered 2014-01-14: 50 ug via INTRAVENOUS
  Administered 2014-01-14: 100 ug via INTRAVENOUS

## 2014-01-14 MED ORDER — OXYCODONE HCL 5 MG/5ML PO SOLN: 5.0000 mg | Freq: Once | ORAL | Status: AC | PRN

## 2014-01-14 MED ORDER — LACTATED RINGERS IV SOLN
INTRAVENOUS | Status: DC
  Administered 2014-01-14 (×2): via INTRAVENOUS

## 2014-01-14 MED ORDER — OXYCODONE HCL 5 MG PO TABS
5.0000 mg | ORAL_TABLET | Freq: Once | ORAL | Status: AC | PRN
  Administered 2014-01-14: 5 mg via ORAL

## 2014-01-14 MED ORDER — PROPOFOL 10 MG/ML IV BOLUS
INTRAVENOUS | Status: DC | PRN
  Administered 2014-01-14: 30 mg via INTRAVENOUS
  Administered 2014-01-14: 200 mg via INTRAVENOUS

## 2014-01-14 MED ORDER — OXYCODONE HCL 5 MG PO TABS
ORAL_TABLET | ORAL | Status: AC
  Filled 2014-01-14: qty 1

## 2014-01-14 MED ORDER — SCOPOLAMINE 1 MG/3DAYS TD PT72
MEDICATED_PATCH | TRANSDERMAL | Status: AC
  Filled 2014-01-14: qty 1

## 2014-01-14 MED ORDER — OXYCODONE-ACETAMINOPHEN 5-325 MG PO TABS: 1.0000 | ORAL_TABLET | ORAL | Status: DC | PRN

## 2014-01-14 MED ORDER — MIDAZOLAM HCL 2 MG/2ML IJ SOLN: 1.0000 mg | INTRAMUSCULAR | Status: DC | PRN

## 2014-01-14 MED ORDER — EPHEDRINE SULFATE 50 MG/ML IJ SOLN
INTRAMUSCULAR | Status: DC | PRN
  Administered 2014-01-14: 10 mg via INTRAVENOUS

## 2014-01-14 MED ORDER — DEXAMETHASONE SODIUM PHOSPHATE 4 MG/ML IJ SOLN
INTRAMUSCULAR | Status: DC | PRN
  Administered 2014-01-14: 10 mg via INTRAVENOUS

## 2014-01-14 MED ORDER — SODIUM CHLORIDE 0.9 % IV SOLN
INTRAVENOUS | Status: DC | PRN
  Administered 2014-01-14: 13:00:00

## 2014-01-14 MED ORDER — BUPIVACAINE-EPINEPHRINE (PF) 0.25% -1:200000 IJ SOLN
INTRAMUSCULAR | Status: AC
  Filled 2014-01-14: qty 60

## 2014-01-14 SURGICAL SUPPLY — 82 items
ADH SKN CLS APL DERMABOND .7 (GAUZE/BANDAGES/DRESSINGS) ×12
BAG DECANTER FOR FLEXI CONT (MISCELLANEOUS) ×4 IMPLANT
BANDAGE ELASTIC 6 VELCRO ST LF (GAUZE/BANDAGES/DRESSINGS) IMPLANT
BINDER BREAST LRG (GAUZE/BANDAGES/DRESSINGS) IMPLANT
BINDER BREAST MEDIUM (GAUZE/BANDAGES/DRESSINGS) IMPLANT
BINDER BREAST XLRG (GAUZE/BANDAGES/DRESSINGS) IMPLANT
BINDER BREAST XXLRG (GAUZE/BANDAGES/DRESSINGS) ×4 IMPLANT
BIOPATCH RED 1 DISK 7.0 (GAUZE/BANDAGES/DRESSINGS) IMPLANT
BLADE 10 SAFETY STRL DISP (BLADE) IMPLANT
BLADE 15 SAFETY STRL DISP (BLADE) IMPLANT
BLADE HEX COATED 2.75 (ELECTRODE) ×4 IMPLANT
BLADE KNIFE PERSONA 10 (BLADE) ×4 IMPLANT
BLADE SURG 10 STRL SS (BLADE) ×8 IMPLANT
BLADE SURG 15 STRL LF DISP TIS (BLADE) ×3 IMPLANT
BLADE SURG 15 STRL SS (BLADE) ×4
BNDG GAUZE ELAST 4 BULKY (GAUZE/BANDAGES/DRESSINGS) ×8 IMPLANT
CANISTER SUCT 1200ML W/VALVE (MISCELLANEOUS) ×4 IMPLANT
CHLORAPREP W/TINT 26ML (MISCELLANEOUS) ×4 IMPLANT
COVER MAYO STAND STRL (DRAPES) ×4 IMPLANT
COVER TABLE BACK 60X90 (DRAPES) ×4 IMPLANT
DECANTER SPIKE VIAL GLASS SM (MISCELLANEOUS) IMPLANT
DERMABOND ADVANCED (GAUZE/BANDAGES/DRESSINGS) ×4
DERMABOND ADVANCED .7 DNX12 (GAUZE/BANDAGES/DRESSINGS) ×12 IMPLANT
DRAIN CHANNEL 15F RND FF W/TCR (WOUND CARE) ×4 IMPLANT
DRAIN CHANNEL 19F RND (DRAIN) IMPLANT
DRAPE LAPAROSCOPIC ABDOMINAL (DRAPES) ×4 IMPLANT
DRSG PAD ABDOMINAL 8X10 ST (GAUZE/BANDAGES/DRESSINGS) ×8 IMPLANT
DRSG TEGADERM 2-3/8X2-3/4 SM (GAUZE/BANDAGES/DRESSINGS) IMPLANT
ELECT BLADE 4.0 EZ CLEAN MEGAD (MISCELLANEOUS) ×4
ELECT COATED BLADE 2.86 ST (ELECTRODE) ×4 IMPLANT
ELECT REM PT RETURN 9FT ADLT (ELECTROSURGICAL) ×4
ELECTRODE BLDE 4.0 EZ CLN MEGD (MISCELLANEOUS) ×3 IMPLANT
ELECTRODE REM PT RTRN 9FT ADLT (ELECTROSURGICAL) ×3 IMPLANT
EVACUATOR SILICONE 100CC (DRAIN) ×4 IMPLANT
GAUZE SPONGE 4X4 12PLY STRL (GAUZE/BANDAGES/DRESSINGS) IMPLANT
GLOVE BIO SURGEON STRL SZ 6 (GLOVE) ×4 IMPLANT
GLOVE BIO SURGEON STRL SZ 6.5 (GLOVE) IMPLANT
GLOVE BIOGEL PI IND STRL 7.0 (GLOVE) ×3 IMPLANT
GLOVE BIOGEL PI INDICATOR 7.0 (GLOVE) ×1
GLOVE ECLIPSE 6.5 STRL STRAW (GLOVE) ×8 IMPLANT
GLOVE EXAM NITRILE EXT CUFF MD (GLOVE) ×4 IMPLANT
GOWN STRL REUS W/ TWL LRG LVL3 (GOWN DISPOSABLE) ×6 IMPLANT
GOWN STRL REUS W/TWL LRG LVL3 (GOWN DISPOSABLE) ×8
IMPLANT SALINE 650CC (Breast) ×4 IMPLANT
IV NS 1000ML (IV SOLUTION) ×6
IV NS 1000ML BAXH (IV SOLUTION) ×6 IMPLANT
IV NS 500ML (IV SOLUTION)
IV NS 500ML BAXH (IV SOLUTION) IMPLANT
KIT FILL SYSTEM UNIVERSAL (SET/KITS/TRAYS/PACK) ×4 IMPLANT
NEEDLE HYPO 25X1 1.5 SAFETY (NEEDLE) ×4 IMPLANT
NS IRRIG 1000ML POUR BTL (IV SOLUTION) ×4 IMPLANT
PACK BASIN DAY SURGERY FS (CUSTOM PROCEDURE TRAY) ×4 IMPLANT
PENCIL BUTTON HOLSTER BLD 10FT (ELECTRODE) ×4 IMPLANT
PIN SAFETY STERILE (MISCELLANEOUS) ×4 IMPLANT
SIZER BREAST SAL SGL USE 650CC (SIZER) ×4 IMPLANT
SLEEVE SCD COMPRESS KNEE MED (MISCELLANEOUS) ×4 IMPLANT
SPONGE GAUZE 4X4 12PLY STER LF (GAUZE/BANDAGES/DRESSINGS) IMPLANT
SPONGE LAP 18X18 X RAY DECT (DISPOSABLE) ×8 IMPLANT
STAPLER VISISTAT 35W (STAPLE) ×8 IMPLANT
STRIP CLOSURE SKIN 1/2X4 (GAUZE/BANDAGES/DRESSINGS) ×4 IMPLANT
SUT ETHILON 2 0 FS 18 (SUTURE) ×4 IMPLANT
SUT MNCRL AB 4-0 PS2 18 (SUTURE) ×8 IMPLANT
SUT MON AB 3-0 SH 27 (SUTURE)
SUT MON AB 3-0 SH27 (SUTURE) IMPLANT
SUT MON AB 5-0 PS2 18 (SUTURE) IMPLANT
SUT PDS 3-0 CT2 (SUTURE)
SUT PDS AB 2-0 CT2 27 (SUTURE) IMPLANT
SUT PDS II 3-0 CT2 27 ABS (SUTURE) IMPLANT
SUT SILK 3 0 PS 1 (SUTURE) IMPLANT
SUT VIC AB 3-0 FS2 27 (SUTURE) ×16 IMPLANT
SUT VIC AB 3-0 PS1 18 (SUTURE)
SUT VIC AB 3-0 PS1 18XBRD (SUTURE) IMPLANT
SUT VIC AB 3-0 SH 27 (SUTURE)
SUT VIC AB 3-0 SH 27X BRD (SUTURE) IMPLANT
SUT VICRYL 4-0 PS2 18IN ABS (SUTURE) ×20 IMPLANT
SYR 50ML LL SCALE MARK (SYRINGE) ×4 IMPLANT
SYR BULB IRRIGATION 50ML (SYRINGE) ×4 IMPLANT
SYR CONTROL 10ML LL (SYRINGE) ×4 IMPLANT
TOWEL OR 17X24 6PK STRL BLUE (TOWEL DISPOSABLE) ×8 IMPLANT
TUBE CONNECTING 20X1/4 (TUBING) ×12 IMPLANT
UNDERPAD 30X30 INCONTINENT (UNDERPADS AND DIAPERS) ×8 IMPLANT
YANKAUER SUCT BULB TIP NO VENT (SUCTIONS) ×4 IMPLANT

## 2014-01-14 NOTE — Op Note (Signed)
Operative Note   DATE OF OPERATION: 5.26.2015  LOCATION: [facility]-outpatient  SURGICAL DIVISION: Plastic Surgery  PREOPERATIVE DIAGNOSES:  1. History Right breast cancer 2. Acquired absence of right breast  POSTOPERATIVE DIAGNOSES:  same  PROCEDURE:  1. Removal right breast tissue expander and placement permanent saline, anatomic implant 2. Left breast mastopexy for symmetry 3. Removal left chest port a Cath.  SURGEON: Irene Limbo MD MBA  ASSISTANT: none  ANESTHESIA:  General.   EBL: 50 ML  COMPLICATIONS: None.   INDICATIONS FOR PROCEDURE:  The patient, Morgan Santos, is a 63 y.o. female born on 1951-01-02, is here for second stage breast reconstruction.   FINDINGS: Brewing technologist Profile implant, style 2700, Ref [id number] SN [id number] Fill volume 700 ml  DESCRIPTION OF PROCEDURE:  The patient was marked in standing position in preoperative area. The sternal notch, chest midline, and anterior axillary lines were marked. Area of redundant soft tissue over anterior right breast also marked.  The left inframammary fold was transferred onto anterior surface of  Breast by palpation. With aid of Wise pattern marker the inset of nipple areolar complex was marked at this location and vertical limbs for resection were marked. The patient was taken to the operating room. SCDs were placed and IV antibiotics were given. The patient's operative site was prepped and draped in a sterile fashion. A time out was performed and all information was confirmed to be correct. In the supine position the inframammary folds and lateral limbs for resection of left breast were marked.  I began with incision through left superior chest scar over port. Incision carried to port and two sutures removed. The port was removed in its entirety without difficulty. Pressure was maintained over area. Wound irrigated and closure completed with 4-0 vicryl in dermis followed by 4-0 monocryl subcuticular.   Over the right reconstructed breast, incision was made through prior scar medially. Incision carried to capsule and incised. Tissue expander removed. Examination of capsule with full incorporation of acellular dermis. Capsulotomies performed at superior extent capsule and additional radial scoring completed superiorly with cautery. Saline sizer placed in right breast and filled to 700 ml. Patient brought to upright position and tailor tacked closed. The area of redundant soft tissue anteriorly was excised. The patient was returned to supine position. On stretch the nipple areolar complex was marked with 50 mm diameter marker. An inferior pedicle was de-epithelialized and medial and lateral triangles of tissue were excised. The medial and lateral flaps were freed to extent to allow redraping. The patient was brought to sitting position again and tailor tacked closed. Additional areas of redundant skin were marked. The patient was returned to supine position. The redundant skin was excised. The breast was irrigated and hemostasis obtained. Closure was completed in layers with 3-0 vicryl in dermis along inframammary fold and 4-0 vicryl in dermis along vertical limbs and NAC inset. Skin closure completed with 4-0 monocryl subcuticular throughout.  I returned to right breast where sizer was removed and pocket irrigated with solution containing Ancef, gentamicin, and bacitracin. 15 Fr JP placed in cavity. Permanent textured anatomic implant prepared and placed in cavity. Proper orientation of implant ensured and filled to 700 ml. Fill tube removed. Closure completed with 4-0 vicryl for approximation of capsule and superficial fascia. 4-0 vicryl placed in dermis and running 4-0 monocryl for skin closure.   Dermabond applied to all incisions and dry dressing, breast binder placed.   The patient was allowed to wake from anesthesia,  extubated and taken to the recovery room in satisfactory condition.   SPECIMENS: 1.  R breast mastectomy skin flap 2. Left breast mastopexy tissue  DRAINS: 15 Fr in right reconstructed breast  Irene Limbo, MD [facility] Plastic & Reconstructive Surgery [phone]

## 2014-01-14 NOTE — Transfer of Care (Signed)
Immediate Anesthesia Transfer of Care Note  Patient: Morgan Santos  Procedure(s) Performed: Procedure(s): REMOVAL OF RIGHT TISSUE EXPANDERS WITH PLACEMENT OF PERMANENT BREAST IMPLANT (Right) LEFT BREAST MASTOPEXY FOR SYMMETRY  (Left) REMOVAL PORT-A-CATH (Left)  Patient Location: PACU  Anesthesia Type:General  Level of Consciousness: awake, sedated and patient cooperative  Airway & Oxygen Therapy: Patient Spontanous Breathing and Patient connected to face mask oxygen  Post-op Assessment: Report given to PACU RN and Post -op Vital signs reviewed and stable  Post vital signs: Reviewed and stable  Complications: No apparent anesthesia complications

## 2014-01-14 NOTE — H&P (Signed)
  Date of Service: 01/02/2014  Progress Notes by Irene Limbo, MD at 01/01/2014 9:04 AM   Author: Irene Limbo, MD Service: (none) Author Type: Physician   Filed: 01/02/2014 10:23 AM Note Time: 01/01/2014 9:04 AM Note Type: Progress Notes   Status: Signed Editor: Irene Limbo, MD (Physician)    5.5 months post op from TE placement and mastectomy. Completed chemotherapy beginning of this month. Plan for implant exchange and contralateral symmetry procedure, removal port. She has decided on saline implants.  Wearing 40 C bra currently with towel in right cup to fill out.  PE PULM: clear auscultation bilaterally CV: regular rate, normal heart sounds CHEST: Scars soft, right reconstructed breast with redundant skin medial and lateral extent of mastectomy incisions Left breast soft no masses, scar at 12 o clock grade 2 ptosis; R reconstructed breast with some redundant mastectomy skin medially SN to nipple L 32 cm BW R 15 L 19 cm  nipple to IMF 9.5 cm  A/P  Plan overnight stay post surgery as she had significant nausea with anesthesia.  Reviewed risks including but not limited to bleeding, hematoma, infection, wound healing problems, implant extrusion, asymmetry, nipple loss, need for additional surgery, damage to deeper structures reviewed. Implant specific complication reviewed including contracture, infection, rupture, rippling, asymmetry with native breast reviewed. Will plan for anatomic saline implants, risk of rotatation, malposition of this discussed.    Right breast Fill volume 550 cc, 550 ml style 9200 TE

## 2014-01-14 NOTE — Anesthesia Preprocedure Evaluation (Signed)
Anesthesia Evaluation  Patient identified by MRN, date of birth, ID band Patient awake    Reviewed: Allergy & Precautions, H&P , NPO status , Patient's Chart, lab work & pertinent test results  History of Anesthesia Complications (+) PONV  Airway Mallampati: I TM Distance: >3 FB Neck ROM: Full    Dental  (+) Teeth Intact, Dental Advisory Given   Pulmonary former smoker,  breath sounds clear to auscultation        Cardiovascular hypertension, Pt. on medications Rhythm:Regular Rate:Normal     Neuro/Psych    GI/Hepatic GERD-  Medicated and Controlled,  Endo/Other    Renal/GU      Musculoskeletal   Abdominal   Peds  Hematology   Anesthesia Other Findings   Reproductive/Obstetrics                           Anesthesia Physical Anesthesia Plan  ASA: III  Anesthesia Plan: General   Post-op Pain Management:    Induction: Intravenous  Airway Management Planned: LMA  Additional Equipment:   Intra-op Plan:   Post-operative Plan: Extubation in OR  Informed Consent: I have reviewed the patients History and Physical, chart, labs and discussed the procedure including the risks, benefits and alternatives for the proposed anesthesia with the patient or authorized representative who has indicated his/her understanding and acceptance.   Dental advisory given  Plan Discussed with: CRNA and Anesthesiologist  Anesthesia Plan Comments:         Anesthesia Quick Evaluation

## 2014-01-14 NOTE — Anesthesia Procedure Notes (Signed)
Procedure Name: Intubation Date/Time: 01/14/2014 12:32 PM Performed by: Lyndee Leo Pre-anesthesia Checklist: Patient identified, Emergency Drugs available, Suction available and Patient being monitored Patient Re-evaluated:Patient Re-evaluated prior to inductionOxygen Delivery Method: Circle System Utilized Preoxygenation: Pre-oxygenation with 100% oxygen Intubation Type: IV induction Ventilation: Mask ventilation without difficulty Laryngoscope Size: Mac and 3 Grade View: Grade I Tube type: Oral Tube size: 7.0 mm Number of attempts: 1 Airway Equipment and Method: stylet and oral airway Placement Confirmation: ETT inserted through vocal cords under direct vision,  positive ETCO2 and breath sounds checked- equal and bilateral Secured at: 20 cm Tube secured with: Tape Dental Injury: Teeth and Oropharynx as per pre-operative assessment

## 2014-01-14 NOTE — Anesthesia Postprocedure Evaluation (Signed)
  Anesthesia Post-op Note  Patient: Morgan Santos  Procedure(s) Performed: Procedure(s): REMOVAL OF RIGHT TISSUE EXPANDERS WITH PLACEMENT OF PERMANENT BREAST IMPLANT (Right) LEFT BREAST MASTOPEXY FOR SYMMETRY  (Left) REMOVAL PORT-A-CATH (Left)  Patient Location: PACU  Anesthesia Type:General  Level of Consciousness: awake, alert  and oriented  Airway and Oxygen Therapy: Patient Spontanous Breathing  Post-op Pain: mild  Post-op Assessment: Post-op Vital signs reviewed  Post-op Vital Signs: Reviewed  Last Vitals:  Filed Vitals:   01/14/14 1615  BP: 137/77  Pulse: 78  Temp:   Resp: 15    Complications: No apparent anesthesia complications

## 2014-01-14 NOTE — Discharge Instructions (Signed)
Call your surgeon if you experience:   1.  Fever over 101.0. 2.  Inability to urinate. 3.  Nausea and/or vomiting. 4.  Extreme swelling or bruising at the surgical site. 5.  Continued bleeding from the incision. 6.  Increased pain, redness or drainage from the incision. 7.  Problems related to your pain medication.   Post Anesthesia Home Care Instructions  Activity: Get plenty of rest for the remainder of the day. A responsible adult should stay with you for 24 hours following the procedure.  For the next 24 hours, DO NOT: -Drive a car -Paediatric nurse -Drink alcoholic beverages -Take any medication unless instructed by your physician -Make any legal decisions or sign important papers.  Meals: Start with liquid foods such as gelatin or soup. Progress to regular foods as tolerated. Avoid greasy, spicy, heavy foods. If nausea and/or vomiting occur, drink only clear liquids until the nausea and/or vomiting subsides. Call your physician if vomiting continues.  Special Instructions/Symptoms: Your throat may feel dry or sore from the anesthesia or the breathing tube placed in your throat during surgery. If this causes discomfort, gargle with warm salt water. The discomfort should disappear within 24 hours.  About my Jackson-Pratt Bulb Drain  What is a Jackson-Pratt bulb? A Jackson-Pratt is a soft, round device used to collect drainage. It is connected to a long, thin drainage catheter, which is held in place by one or two small stiches near your surgical incision site. When the bulb is squeezed, it forms a vacuum, forcing the drainage to empty into the bulb.  Emptying the Jackson-Pratt bulb- To empty the bulb: 1. Release the plug on the top of the bulb. 2. Pour the bulb's contents into a measuring container which your nurse will provide. 3. Record the time emptied and amount of drainage. Empty the drain(s) as often as your     doctor or nurse recommends.  Date                  Time                     Amount (Drain 1)                 Amount (Drain 2)  _____________________________________________________________________  _____________________________________________________________________  _____________________________________________________________________  _____________________________________________________________________  _____________________________________________________________________  _____________________________________________________________________  _____________________________________________________________________  _____________________________________________________________________  Squeezing the Jackson-Pratt Bulb- To squeeze the bulb: 1. Make sure the plug at the top of the bulb is open. 2. Squeeze the bulb tightly in your fist. You will hear air squeezing from the bulb. 3. Replace the plug while the bulb is squeezed. 4. Use a safety pin to attach the bulb to your clothing. This will keep the catheter from     pulling at the bulb insertion site.  When to call your doctor- Call your doctor if:  Drain site becomes red, swollen or hot.  You have a fever greater than 101 degrees F.  There is oozing at the drain site.  Drain falls out (apply a guaze bandage over the drain hole and secure it with tape).  Drainage increases daily not related to activity patterns. (You will usually have more drainage when you are active than when you are resting.)  Drainage has a bad odor.

## 2014-01-14 NOTE — Interval H&P Note (Signed)
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 After consideration of risks, benefits and other options for treatment, the patient has consented to  Procedure(s): REMOVAL OF RIGHT TISSUE EXPANDERS WITH PLACEMENT OF PERMANENT BREAST IMPLANT (Right) LEFT BREAST MASTOPEXY FOR SYMMETRY  (Left) as a surgical intervention .  The patient's history has been reviewed, patient examined, no change in status, stable for surgery.  I have reviewed the patient's chart and labs.  Questions were answered to the patient's satisfaction.     Morgan Santos

## 2014-01-16 ENCOUNTER — Ambulatory Visit: Payer: 59 | Admitting: Oncology

## 2014-01-16 ENCOUNTER — Encounter (HOSPITAL_BASED_OUTPATIENT_CLINIC_OR_DEPARTMENT_OTHER): Payer: Self-pay | Admitting: Plastic Surgery

## 2014-01-16 ENCOUNTER — Other Ambulatory Visit: Payer: 59

## 2014-01-16 ENCOUNTER — Encounter: Payer: Self-pay | Admitting: Oncology

## 2014-01-16 NOTE — Progress Notes (Signed)
The outstanding claims were paid on 01/15/14, once payment date is 45 days out I can give to payment posting. Per billing in reference to [id number] still pending.

## 2014-01-21 ENCOUNTER — Ambulatory Visit: Payer: Self-pay | Admitting: Oncology

## 2014-01-21 ENCOUNTER — Other Ambulatory Visit: Payer: 59

## 2014-02-10 ENCOUNTER — Telehealth: Payer: Self-pay | Admitting: Adult Health

## 2014-02-10 NOTE — Telephone Encounter (Signed)
LVM ADVISING APPT W/LC ON 6/30 HAS BEEN MOVED TO 8/11 @ 12.45P. ADVISED PT TO KEEP APPT FOR LAB/FLUSH ON 6/30. MAILED APPT CALEDARS FOR 6/30 AND 04/01/14.

## 2014-02-18 ENCOUNTER — Ambulatory Visit: Payer: 59

## 2014-02-18 ENCOUNTER — Other Ambulatory Visit: Payer: Self-pay

## 2014-02-18 ENCOUNTER — Ambulatory Visit: Payer: Self-pay | Admitting: Adult Health

## 2014-02-18 ENCOUNTER — Other Ambulatory Visit (HOSPITAL_BASED_OUTPATIENT_CLINIC_OR_DEPARTMENT_OTHER): Payer: 59

## 2014-02-18 DIAGNOSIS — C50419 Malignant neoplasm of upper-outer quadrant of unspecified female breast: Secondary | ICD-10-CM

## 2014-02-18 DIAGNOSIS — Z95828 Presence of other vascular implants and grafts: Secondary | ICD-10-CM

## 2014-02-18 DIAGNOSIS — C50411 Malignant neoplasm of upper-outer quadrant of right female breast: Secondary | ICD-10-CM

## 2014-02-18 LAB — CBC WITH DIFFERENTIAL/PLATELET
BASO%: 1.1 % (ref 0.0–2.0)
BASOS ABS: 0 10*3/uL (ref 0.0–0.1)
EOS%: 1.9 % (ref 0.0–7.0)
Eosinophils Absolute: 0.1 10*3/uL (ref 0.0–0.5)
HCT: 38.3 % (ref 34.8–46.6)
HEMOGLOBIN: 12.2 g/dL (ref 11.6–15.9)
LYMPH%: 54.3 % — ABNORMAL HIGH (ref 14.0–49.7)
MCH: 27.7 pg (ref 25.1–34.0)
MCHC: 31.8 g/dL (ref 31.5–36.0)
MCV: 87.2 fL (ref 79.5–101.0)
MONO#: 0.5 10*3/uL (ref 0.1–0.9)
MONO%: 10.7 % (ref 0.0–14.0)
NEUT%: 32 % — ABNORMAL LOW (ref 38.4–76.8)
NEUTROS ABS: 1.4 10*3/uL — AB (ref 1.5–6.5)
Platelets: 142 10*3/uL — ABNORMAL LOW (ref 145–400)
RBC: 4.39 10*6/uL (ref 3.70–5.45)
RDW: 14.3 % (ref 11.2–14.5)
WBC: 4.5 10*3/uL (ref 3.9–10.3)
lymph#: 2.4 10*3/uL (ref 0.9–3.3)

## 2014-02-18 LAB — COMPREHENSIVE METABOLIC PANEL (CC13)
ALT: 15 U/L (ref 0–55)
AST: 17 U/L (ref 5–34)
Albumin: 3.4 g/dL — ABNORMAL LOW (ref 3.5–5.0)
Alkaline Phosphatase: 97 U/L (ref 40–150)
Anion Gap: 9 mEq/L (ref 3–11)
BUN: 8.8 mg/dL (ref 7.0–26.0)
CALCIUM: 9.4 mg/dL (ref 8.4–10.4)
CHLORIDE: 108 meq/L (ref 98–109)
CO2: 24 mEq/L (ref 22–29)
Creatinine: 0.7 mg/dL (ref 0.6–1.1)
Glucose: 89 mg/dl (ref 70–140)
Potassium: 3.9 mEq/L (ref 3.5–5.1)
Sodium: 142 mEq/L (ref 136–145)
TOTAL PROTEIN: 6.4 g/dL (ref 6.4–8.3)
Total Bilirubin: 0.48 mg/dL (ref 0.20–1.20)

## 2014-02-18 MED ORDER — HEPARIN SOD (PORK) LOCK FLUSH 100 UNIT/ML IV SOLN
500.0000 [IU] | Freq: Once | INTRAVENOUS | Status: DC
  Filled 2014-02-18: qty 5

## 2014-02-18 MED ORDER — SODIUM CHLORIDE 0.9 % IJ SOLN
10.0000 mL | INTRAMUSCULAR | Status: DC | PRN
  Filled 2014-02-18: qty 10

## 2014-02-18 NOTE — Progress Notes (Signed)
Patient cancelled flush appointment, Stated she had PAC removed with breast surgery. Verified PAC removed 01/14/14 by Irene Limbo, MD. Future flush appointments cancelled, patient sent to phlebotomist for lab draw at this time and provided a copy of future schedule.

## 2014-04-01 ENCOUNTER — Other Ambulatory Visit (HOSPITAL_BASED_OUTPATIENT_CLINIC_OR_DEPARTMENT_OTHER): Payer: 59

## 2014-04-01 ENCOUNTER — Encounter: Payer: Self-pay | Admitting: Adult Health

## 2014-04-01 ENCOUNTER — Ambulatory Visit (HOSPITAL_BASED_OUTPATIENT_CLINIC_OR_DEPARTMENT_OTHER): Payer: 59 | Admitting: Adult Health

## 2014-04-01 ENCOUNTER — Telehealth: Payer: Self-pay | Admitting: Hematology and Oncology

## 2014-04-01 VITALS — BP 128/73 | HR 55 | Temp 98.4°F | Resp 20 | Ht 69.0 in | Wt 214.8 lb

## 2014-04-01 DIAGNOSIS — C50419 Malignant neoplasm of upper-outer quadrant of unspecified female breast: Secondary | ICD-10-CM

## 2014-04-01 DIAGNOSIS — C50411 Malignant neoplasm of upper-outer quadrant of right female breast: Secondary | ICD-10-CM

## 2014-04-01 DIAGNOSIS — Z170 Estrogen receptor positive status [ER+]: Secondary | ICD-10-CM

## 2014-04-01 LAB — COMPREHENSIVE METABOLIC PANEL
ALBUMIN: 3.9 g/dL (ref 3.5–5.2)
ALK PHOS: 103 U/L (ref 39–117)
ALT: 18 U/L (ref 0–35)
AST: 19 U/L (ref 0–37)
BILIRUBIN TOTAL: 0.5 mg/dL (ref 0.2–1.2)
BUN: 12 mg/dL (ref 6–23)
CO2: 28 mEq/L (ref 19–32)
Calcium: 9.1 mg/dL (ref 8.4–10.5)
Chloride: 101 mEq/L (ref 96–112)
Creatinine, Ser: 0.71 mg/dL (ref 0.50–1.10)
Glucose, Bld: 75 mg/dL (ref 70–99)
Potassium: 3.9 mEq/L (ref 3.5–5.3)
SODIUM: 135 meq/L (ref 135–145)
Total Protein: 6.2 g/dL (ref 6.0–8.3)

## 2014-04-01 LAB — CBC WITH DIFFERENTIAL/PLATELET
BASO%: 0.3 % (ref 0.0–2.0)
Basophils Absolute: 0 10*3/uL (ref 0.0–0.1)
EOS%: 2.8 % (ref 0.0–7.0)
Eosinophils Absolute: 0.1 10*3/uL (ref 0.0–0.5)
HCT: 39.7 % (ref 34.8–46.6)
HGB: 12.6 g/dL (ref 11.6–15.9)
LYMPH%: 50.1 % — AB (ref 14.0–49.7)
MCH: 26.9 pg (ref 25.1–34.0)
MCHC: 31.7 g/dL (ref 31.5–36.0)
MCV: 84.9 fL (ref 79.5–101.0)
MONO#: 0.4 10*3/uL (ref 0.1–0.9)
MONO%: 8.6 % (ref 0.0–14.0)
NEUT#: 1.7 10*3/uL (ref 1.5–6.5)
NEUT%: 38.2 % — ABNORMAL LOW (ref 38.4–76.8)
Platelets: 200 10*3/uL (ref 145–400)
RBC: 4.68 10*6/uL (ref 3.70–5.45)
RDW: 14.8 % — AB (ref 11.2–14.5)
WBC: 4.6 10*3/uL (ref 3.9–10.3)
lymph#: 2.3 10*3/uL (ref 0.9–3.3)

## 2014-04-01 MED ORDER — ANASTROZOLE 1 MG PO TABS: 1.0000 mg | ORAL_TABLET | Freq: Every day | ORAL | Status: DC

## 2014-04-01 NOTE — Progress Notes (Addendum)
[facility] OFFICE PROGRESS NOTE  Patient Care Team: [name], PA-C as PCP - General ([place]) Amada Kingfisher, MD as Consulting Physician (Hematology and Oncology) Dr. Rolm Bookbinder  Dr. Thea Silversmith  DIAGNOSIS: 63 year old female with diagnosis of right breast cancer   STAGE:  Breast cancer of upper-outer quadrant of right female breast  Primary site: Breast (Right)  Staging method: AJCC 7th Edition  Clinical: Stage IA (T1c, N0, cM0)  Summary: Stage IA (T1c, N0, cM0)   SUMMARY OF ONCOLOGIC HISTORY: #1 Patient underwent a screening mammogram in 04/2013 and was found to have a right breast mass laterally. An ultrasound showed a 7 mm lesion at the 9:00 position 7 cm from the nipple. She was also found to have a second medial lesion at the 9:00 position 4 cm from the nipple. She underwent a biopsy of both lesions. 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 The second lesion also revealed invasive ductal carcinoma again ER positive PR positive HER-2/neu negative with a proliferation marker Ki-67 11%. She had an MRI performed that revealed 1.3. cm postbiopsy changes. She also was noted to have area of concern from anterior to posterior enhancement. This area measured 6 cm  #2 patient is status post right mastectomy done 07/12/2013. Pathology revealed:  1. Breast, simple mastectomy, Right  - INVASIVE GRADE I DUCTAL CARCINOMA, TWO FOCI MEASURING 1.2 CM AND 1.1 CM IN  1 of 4  FINAL for CRYSTALEE, VENTRESS ([id number])  Diagnosis(continued)  GREATEST DIMENSION.  - INTERMEDIATE GRADE DUCTAL CARCINOMA IN SITU IS PRESENT.  - LYMPH/VASCULAR INVASION IS IDENTIFIED.  - INVASIVE DUCTAL CARCINOMA IS EXTREMELY CLOSE (LESS THAN 0.1 CM) TO ANTERIOR  MARGIN.  - OTHER MARGINS ARE NEGATIVE.  - SEE ONCOLOGY TEMPLATE.  2. Lymph  node, sentinel, biopsy, Right  - ONE BENIGN LYMPH WITH NO TUMOR SEEN (0/1).  - SEE COMMENT.  3. Lymph node, sentinel, biopsy, Right  - ONE BENIGN LYMPH NODE WITH NO TUMOR SEEN (0/1).  - SEE COMMENT.  #3 patient had Oncotype DX testing performed.  The patient's recurrence  score is 23. Those patients who had a recurrence score of 23 had an average rate of distant  recurrence of 14%. (JBK:ecj 08/20/2013) . This is in the intermediate risk category.   #4 status post adjuvant Taxotere Cytoxan x4 cycles administered from 09/26/2013 through 11/28/2013. Patient did receive day 2 Neulasta with each of her cycles.   #5 Patient underwent right breast reconstruction on 01/14/2014, and right port removal.     INTERVAL HISTORY: LAURAL EILAND 63 y.o. female returns for followup visit today.  She is here to discuss starting on adjuvant anti-estrogen therapy.  She did undergo breast reconstruction and tolerated it well.  She denies any new pain, fevers, chills, nausea, vomiting, bowel or bladder changes, headaches, weakness or any further difficulty.  We updated her health maintenance below.    Past Medical History  Diagnosis Date  . Breast cancer   . Anxiety   . Depression     pt. remarks that she "takes respridal because if I don't take it I get angry"   . GERD (gastroesophageal reflux disease)     pt. uses vinegar for heartburn, ususally once per week   . Arthritis     hip- R, back   . Hypertension     "when  I get upset"  Not on medication  . Stroke ?2010 or 2011    2010, [facility] for stroke "mini stroke" short term memory problems since  . Anemia     when she was younger  . Constipation   . Complication of anesthesia   . PONV (postoperative nausea and vomiting)     "just for one day"  . Wears glasses   . Wears dentures     top   Past Surgical History  Procedure Laterality Date  . Cholecystectomy    . Breast surgery      for abcesses- removed from both breasts, many yrs. ago   . Mastectomy w/ sentinel node biopsy Right 07/12/2013    Procedure: MASTECTOMY WITH SENTINEL LYMPH NODE BIOPSY;  Surgeon: Rolm Bookbinder, MD;  Location: [facility];  Service: General;  Laterality: Right;  . Breast reconstruction with placement of tissue expander and flex hd (acellular hydrated dermis) Right 07/12/2013    Procedure: RIGHT BREAST RECONSTRUCTION WITH PLACEMENT OF TISSUE EXPANDER AND FLEX HD TO RIGHT BREAST (ACELLULAR HYDRATED DERMIS);  Surgeon: Irene Limbo, MD;  Location: [facility];  Service: Plastics;  Laterality: Right;  . Vaginal hysterectomy    . Colonoscopy    . Mastectomy    . Tubal ligation    . Dilation and curettage of uterus    . Portacath placement N/A 09/16/2013    Procedure: INSERTION PORT-A-CATH;  Surgeon: Rolm Bookbinder, MD;  Location: [facility];  Service: General;  Laterality: N/A;  . Removal of bilateral tissue expanders with placement of bilateral breast implants Right 01/14/2014    Procedure: REMOVAL OF RIGHT TISSUE EXPANDERS WITH PLACEMENT OF PERMANENT BREAST IMPLANT;  Surgeon: Irene Limbo, MD;  Location: [facility];  Service: Plastics;  Laterality: Right;  . Mastopexy Left 01/14/2014    Procedure: LEFT BREAST MASTOPEXY FOR SYMMETRY ;  Surgeon: Irene Limbo, MD;  Location: [facility];  Service: Plastics;  Laterality: Left;  . Port-a-cath removal Left 01/14/2014    Procedure: REMOVAL PORT-A-CATH;  Surgeon: Irene Limbo, MD;  Location: [facility];  Service: Plastics;  Laterality: Left;   History   Social History  . Marital Status: Married    Spouse Name: N/A    Number of Children: N/A  . Years of Education: N/A   Occupational History  . Not on file.   Social History Main Topics  . Smoking status: Former Smoker    Quit date: 09/14/1995  . Smokeless tobacco: Never Used  . Alcohol Use: Yes     Comment: once q 3 months  . Drug Use: Yes    Special: Marijuana     Comment: in the past  . Sexual  Activity: Yes   Other Topics Concern  . Not on file   Social History Narrative  . No narrative on file     ALLERGIES:  is allergic to codeine and multihance.  MEDICATIONS:  Current Outpatient Prescriptions  Medication Sig Dispense Refill  . ALPRAZolam (XANAX) 0.5 MG tablet Take 0.5 mg by mouth at bedtime as needed for anxiety.       . Cholecalciferol (VITAMIN D-3) 5000 UNITS TABS Take 2 each by mouth daily.      . Omega-3 Fatty Acids (FISH OIL PO) Take 1 capsule by mouth every morning.      . risperiDONE (RISPERDAL) 0.25 MG tablet Take 1 mg by mouth at bedtime.      . simvastatin (ZOCOR) 20 MG tablet Take 20 mg by mouth  every evening.       No current facility-administered medications for this visit.    REVIEW OF SYSTEMS:   A 10 point review of systems was conducted and is otherwise negative except for what is noted above.    Health Maintenance  Mammogram: 04/2013 Colonoscopy: due Bone Density Scan: 11/2013, normal Pap Smear: s/p TAH Eye Exam: due Vitamin D Level: next lab appt Lipid Panel:managed by PCP    PHYSICAL EXAMINATION:  Filed Vitals:   04/01/14 1305  BP: 128/73  Pulse: 55  Temp: 98.4 F (36.9 C)  Resp: 20   Filed Weights   04/01/14 1305  Weight: 214 lb 12.8 oz (97.433 kg)  GENERAL: Patient is a well appearing female in no acute distress HEENT:  Sclerae anicteric.  Oropharynx clear and moist. No ulcerations or evidence of oropharyngeal candidiasis. Neck is supple.  NODES:  No cervical, supraclavicular, or axillary lymphadenopathy palpated.  BREAST EXAM:  Right breast s/p mastectomy and implant placement left breast without nodules/masses, slightly tender, s/p mammoplasty, scars in bilateral breasts are well healed.  Benign bilateral breast exam. LUNGS:  Clear to auscultation bilaterally.  No wheezes or rhonchi. HEART:  Regular rate and rhythm. No murmur appreciated. ABDOMEN:  Soft, nontender.  Positive, normoactive bowel sounds. No organomegaly  palpated. MSK:  No focal spinal tenderness to palpation. Full range of motion bilaterally in the upper extremities. EXTREMITIES:  No peripheral edema.   SKIN:  Clear with no obvious rashes or skin changes. No nail dyscrasia. NEURO:  Nonfocal. Well oriented.  Appropriate affect. ECOG:  LABORATORY DATA:  I have reviewed the data as listed    Component Value Date/Time   NA 142 02/18/2014 1104   NA 146 09/13/2013 1200   K 3.9 02/18/2014 1104   K 4.5 09/13/2013 1200   CL 108 09/13/2013 1200   CO2 24 02/18/2014 1104   CO2 24 09/13/2013 1200   GLUCOSE 89 02/18/2014 1104   GLUCOSE 96 09/13/2013 1200   BUN 8.8 02/18/2014 1104   BUN 13 09/13/2013 1200   CREATININE 0.7 02/18/2014 1104   CREATININE 0.67 09/13/2013 1200   CALCIUM 9.4 02/18/2014 1104   CALCIUM 9.6 09/13/2013 1200   PROT 6.4 02/18/2014 1104   ALBUMIN 3.4* 02/18/2014 1104   AST 17 02/18/2014 1104   ALT 15 02/18/2014 1104   ALKPHOS 97 02/18/2014 1104   BILITOT 0.48 02/18/2014 1104   GFRNONAA >90 09/13/2013 1200   GFRAA >90 09/13/2013 1200    No results found for this basename: SPEP,  UPEP,   kappa and lambda light chains    Lab Results  Component Value Date   WBC 4.6 04/01/2014   NEUTROABS 1.7 04/01/2014   HGB 12.6 04/01/2014   HCT 39.7 04/01/2014   MCV 84.9 04/01/2014   PLT 200 04/01/2014      Chemistry      Component Value Date/Time   NA 142 02/18/2014 1104   NA 146 09/13/2013 1200   K 3.9 02/18/2014 1104   K 4.5 09/13/2013 1200   CL 108 09/13/2013 1200   CO2 24 02/18/2014 1104   CO2 24 09/13/2013 1200   BUN 8.8 02/18/2014 1104   BUN 13 09/13/2013 1200   CREATININE 0.7 02/18/2014 1104   CREATININE 0.67 09/13/2013 1200      Component Value Date/Time   CALCIUM 9.4 02/18/2014 1104   CALCIUM 9.6 09/13/2013 1200   ALKPHOS 97 02/18/2014 1104   AST 17 02/18/2014 1104   ALT 15 02/18/2014 1104  BILITOT 0.48 02/18/2014 1104       RADIOGRAPHIC STUDIES: I have personally reviewed the radiological images as listed and agreed with the findings in  the report. No results found.    ASSESSMENT:  63 year old female with  #1 stage I (T1 N0) invasive ductal carcinoma of the right breast. Patient is status post mastectomy with sentinel lymph node biopsy. The final pathology did reveal 2 foci of disease one measuring 1.1 cm and the second measuring 1.2 cm. Tumor was ER positive PR positive HER-2/neu negative with a proliferation marker Ki-67 11%. Sentinel node was negative for metastatic disease. Oncotype testing performed revealed a risk of 14% of distant recurrence with tamoxifen only. This was in the intermediate risk category. She went on to receive 4 cycles of adjuvant chemotherapy consisting of Taxotere Cytoxan every 3 weeks for a total of 4 cycles.   #2 Patient underwent right breast implant placement and left breast mammoplasty on 01/14/2014 and her port was removed at this time.    #3 Patient will take Arimidex 24m daily starting on 04/01/2014.   PLAN  JCalleenand I reviewed her breast cancer journey thus far today.  She is doing well.  Her labs are normal and I reviewed these with her in detail.  It is time for her to start on anti-estrogen therapy with Arimidex 188mdaily.  This was reviewed with her in detail.  She is concerned about this medication and weight gain.  We discussed Arimidex in detail and I gave her detailed information about this in her AVS.    We also discussed survivorship and updated her health maintenance.  I recommended healthy diet, exercise both aerobic and weight bearing, monthly breast exams, and calcium/vitamin d intake. This was all given to her in detail in her AVS.     JoJaydalynnill return to see usKorean one month for evaluation of how she is tolerating Arimidex therapy.   She knows to call usKorean the interim for any questions or concerns.  We can certainly see her sooner if needed.  JoJurnees in agreement with the above plan.  She knows that in her case her treatment goal is curative.  I spent 25 minutes counseling  the patient face to face.  The total time spent in the appointment was 30 minutes.  LiMinette HeadlandNPSt. [phone]/06/2014 1:37 PM  Attending Note  I personally saw and examined JoWALSIE SMELTZThe plan of care was discussed with her. I agree with the assessment and plan as documented above.  1. Stage I right breast cancer ER/PR positive. I agree that she needs to start adjuvant hormone therapy. I discussed with her extensively the risks and benefits of hormone therapy including the risks of hot flashes myalgias, risk of DVT, uterine bleeding/uterine cancer and risk of osteoporosis, 2. Survivorship: stressed importance of exercise and eating more fruits and vegetables and less red meat.  Signed GuRulon EisenmengerMD

## 2014-04-01 NOTE — Telephone Encounter (Signed)
per pof to sch pt appt-gave pt copy of sch °

## 2014-04-01 NOTE — Patient Instructions (Addendum)
You are doing well.  Take Arimidex 1mg  daily.  I recommend calcium and vitamin d supplementation, healthy diet, regular exercise, including weight bearing exercise, and monthly breast exams.    Anastrozole tablets What is this medicine? ANASTROZOLE (an AS troe zole) is used to treat breast cancer in women who have gone through menopause. Some types of breast cancer depend on estrogen to grow, and this medicine can stop tumor growth by blocking estrogen production. This medicine may be used for other purposes; ask your health care provider or pharmacist if you have questions. COMMON BRAND NAME(S): Arimidex What should I tell my health care provider before I take this medicine? They need to know if you have any of these conditions: -liver disease -an unusual or allergic reaction to anastrozole, other medicines, foods, dyes, or preservatives -pregnant or trying to get pregnant -breast-feeding How should I use this medicine? Take this medicine by mouth with a glass of water. Follow the directions on the prescription label. You can take this medicine with or without food. Take your doses at regular intervals. Do not take your medicine more often than directed. Do not stop taking except on the advice of your doctor or health care professional. Talk to your pediatrician regarding the use of this medicine in children. Special care may be needed. Overdosage: If you think you have taken too much of this medicine contact a poison control center or emergency room at once. NOTE: This medicine is only for you. Do not share this medicine with others. What if I miss a dose? If you miss a dose, take it as soon as you can. If it is almost time for your next dose, take only that dose. Do not take double or extra doses. What may interact with this medicine? Do not take this medicine with any of the following medications: -female hormones, like estrogens or progestins and birth control pills This medicine may also  interact with the following medications: -tamoxifen This list may not describe all possible interactions. Give your health care provider a list of all the medicines, herbs, non-prescription drugs, or dietary supplements you use. Also tell them if you smoke, drink alcohol, or use illegal drugs. Some items may interact with your medicine. What should I watch for while using this medicine? Visit your doctor or health care professional for regular checks on your progress. Let your doctor or health care professional know about any unusual vaginal bleeding. Do not treat yourself for diarrhea, nausea, vomiting or other side effects. Ask your doctor or health care professional for advice. What side effects may I notice from receiving this medicine? Side effects that you should report to your doctor or health care professional as soon as possible: -allergic reactions like skin rash, itching or hives, swelling of the face, lips, or tongue -any new or unusual symptoms -breathing problems -chest pain -leg pain or swelling -vomiting Side effects that usually do not require medical attention (report to your doctor or health care professional if they continue or are bothersome): -back or bone pain -cough, or throat infection -diarrhea or constipation -dizziness -headache -hot flashes -loss of appetite -nausea -sweating -weakness and tiredness -weight gain This list may not describe all possible side effects. Call your doctor for medical advice about side effects. You may report side effects to FDA at 1-800-FDA-1088. Where should I keep my medicine? Keep out of the reach of children. Store at room temperature between 20 and 25 degrees C (68 and 77 degrees F). Throw away  any unused medicine after the expiration date. NOTE: This sheet is a summary. It may not cover all possible information. If you have questions about this medicine, talk to your doctor, pharmacist, or health care provider.  2015,  Elsevier/Gold Standard. (2007-10-19 16:31:52)  Breast Self-Awareness Practicing breast self-awareness may pick up problems early, prevent significant medical complications, and possibly save your life. By practicing breast self-awareness, you can become familiar with how your breasts look and feel and if your breasts are changing. This allows you to notice changes early. It can also offer you some reassurance that your breast health is good. One way to learn what is normal for your breasts and whether your breasts are changing is to do a breast self-exam. If you find a lump or something that was not present in the past, it is best to contact your caregiver right away. Other findings that should be evaluated by your caregiver include nipple discharge, especially if it is bloody; skin changes or reddening; areas where the skin seems to be pulled in (retracted); or new lumps and bumps. Breast pain is seldom associated with cancer (malignancy), but should also be evaluated by a caregiver. HOW TO PERFORM A BREAST SELF-EXAM The best time to examine your breasts is 5-7 days after your menstrual period is over. During menstruation, the breasts are lumpier, and it may be more difficult to pick up changes. If you do not menstruate, have reached menopause, or had your uterus removed (hysterectomy), you should examine your breasts at regular intervals, such as monthly. If you are breastfeeding, examine your breasts after a feeding or after using a breast pump. Breast implants do not decrease the risk for lumps or tumors, so continue to perform breast self-exams as recommended. Talk to your caregiver about how to determine the difference between the implant and breast tissue. Also, talk about the amount of pressure you should use during the exam. Over time, you will become more familiar with the variations of your breasts and more comfortable with the exam. A breast self-exam requires you to remove all your clothes above  the waist. 1. Look at your breasts and nipples. Stand in front of a mirror in a room with good lighting. With your hands on your hips, push your hands firmly downward. Look for a difference in shape, contour, and size from one breast to the other (asymmetry). Asymmetry includes puckers, dips, or bumps. Also, look for skin changes, such as reddened or scaly areas on the breasts. Look for nipple changes, such as discharge, dimpling, repositioning, or redness. 2. Carefully feel your breasts. This is best done either in the shower or tub while using soapy water or when flat on your back. Place the arm (on the side of the breast you are examining) above your head. Use the pads (not the fingertips) of your three middle fingers on your opposite hand to feel your breasts. Start in the underarm area and use  inch (2 cm) overlapping circles to feel your breast. Use 3 different levels of pressure (light, medium, and firm pressure) at each circle before moving to the next circle. The light pressure is needed to feel the tissue closest to the skin. The medium pressure will help to feel breast tissue a little deeper, while the firm pressure is needed to feel the tissue close to the ribs. Continue the overlapping circles, moving downward over the breast until you feel your ribs below your breast. Then, move one finger-width towards the center of the body.  Continue to use the  inch (2 cm) overlapping circles to feel your breast as you move slowly up toward the collar bone (clavicle) near the base of the neck. Continue the up and down exam using all 3 pressures until you reach the middle of the chest. Do this with each breast, carefully feeling for lumps or changes. 3.  Keep a written record with breast changes or normal findings for each breast. By writing this information down, you do not need to depend only on memory for size, tenderness, or location. Write down where you are in your menstrual cycle, if you are still  menstruating. Breast tissue can have some lumps or thick tissue. However, see your caregiver if you find anything that concerns you.  SEEK MEDICAL CARE IF:  You see a change in shape, contour, or size of your breasts or nipples.   You see skin changes, such as reddened or scaly areas on the breasts or nipples.   You have an unusual discharge from your nipples.   You feel a new lump or unusually thick areas.  Document Released: 08/08/2005 Document Revised: 07/25/2012 Document Reviewed: 11/23/2011 [facility] Patient Information 2015 [place], [place]. This information is not intended to replace advice given to you by your health care provider. Make sure you discuss any questions you have with your health care provider.

## 2014-04-10 ENCOUNTER — Telehealth: Payer: Self-pay

## 2014-04-10 NOTE — Telephone Encounter (Signed)
Pt received letter to be triaged. Please call [phone]

## 2014-04-18 NOTE — Telephone Encounter (Signed)
done

## 2014-04-18 NOTE — Telephone Encounter (Signed)
Routing to Manuela Schwartz to nic with physician.

## 2014-04-18 NOTE — Telephone Encounter (Signed)
I returned pt's call. Her last colonoscopy was 01/29/2004 by Dr. Laural Golden.  She is aware he is no longer here and is [place]. She said she is fine to stay with our office and she does not a preference since she does not know the doctors. I scheduled her for OV on 05/20/2014 with extender since she has constipation. She said she takes a colon cleanse but she still only has 1-2 BM's a week.

## 2014-04-18 NOTE — Telephone Encounter (Signed)
DS, have you called this patient for a triage? It's still in my in basket from 8/20.

## 2014-04-21 ENCOUNTER — Encounter: Payer: Self-pay | Admitting: Gastroenterology

## 2014-04-29 ENCOUNTER — Ambulatory Visit (HOSPITAL_BASED_OUTPATIENT_CLINIC_OR_DEPARTMENT_OTHER): Payer: 59 | Admitting: Hematology and Oncology

## 2014-04-29 VITALS — BP 144/77 | HR 53 | Temp 98.6°F | Resp 18 | Wt 213.4 lb

## 2014-04-29 DIAGNOSIS — Z853 Personal history of malignant neoplasm of breast: Secondary | ICD-10-CM

## 2014-04-29 DIAGNOSIS — C50411 Malignant neoplasm of upper-outer quadrant of right female breast: Secondary | ICD-10-CM

## 2014-04-29 DIAGNOSIS — Z9010 Acquired absence of unspecified breast and nipple: Secondary | ICD-10-CM

## 2014-04-29 DIAGNOSIS — N951 Menopausal and female climacteric states: Secondary | ICD-10-CM

## 2014-04-29 NOTE — Progress Notes (Signed)
Patient Care Team: Morgan Sleeper, PA-C as PCP - General (Industry) Morgan Kingfisher, MD as Consulting Physician (Hematology and Oncology) Morgan Binder, MD as Consulting Physician (Gastroenterology)  DIAGNOSIS: Breast cancer of upper-outer quadrant of right female breast   Primary site: Breast (Right)   Staging method: AJCC 7th Edition   Clinical: Stage IA (T1c, N0, cM0)   Pathologic: Stage IA (T1c, N0, cM0) signed by Rulon Eisenmenger, MD on 04/29/2014 12:19 PM   Summary: Stage IA (T1c, N0, cM0)   Clinical comments: Staged at breast conference 10.29.14   SUMMARY OF ONCOLOGIC HISTORY:   Breast cancer of upper-outer quadrant of right female breast   04/29/2013 Mammogram Right breast mass, 7 mm lesion at 9:00 position, IDC with DCIS grade 2 ER  PR 99% positive HER-2 negative Ki-67 11%: Second mass IDC ER/PR positive HER-2 negative Ki-67 11%, MRI 6 cm including enhancement    07/12/2013 Surgery Right breast mastectomy: IDC grade 1, 2 foci 1.2 and 1.1 cm, LVI positive, 2 SLN negative, Oncotype DX score 23 intermediate risk, ROR. 14%   09/26/2013 - 11/28/2013 Chemotherapy Adjuvant Taxotere and Cytoxan x4 cycles   01/14/2014 Surgery Right breast reconstruction and right port removal    CHIEF COMPLIANT: Followup of history of breast cancer  INTERVAL HISTORY: Morgan Santos is a 63 year old lady with above-mentioned history of breast cancer diagnosed September 2014. She underwent right breast mastectomy followed by 4 cycles of adjuvant chemotherapy for intermediate risk Oncotype DX recurrence score of 23. Recently she underwent breast reconstruction on Jan 14 2014. She reports no new problems or concerns and is healing very well from the surgery.   REVIEW OF SYSTEMS:   Constitutional: Denies fevers, chills or abnormal weight loss Eyes: Denies blurriness of vision Ears, nose, mouth, throat, and face: Denies mucositis or sore throat Respiratory: Denies cough, dyspnea or wheezes Cardiovascular: Denies  palpitation, chest discomfort or lower extremity swelling Gastrointestinal:  Denies nausea, heartburn or change in bowel habits Skin: Denies abnormal skin rashes Lymphatics: Denies new lymphadenopathy or easy bruising Neurological:Denies numbness, tingling or new weaknesses Behavioral/Psych: Mood is stable, no new changes  Breast: denies any pain or lumps or nodules  All other systems were reviewed with the patient and are negative.  I have reviewed the past medical history, past surgical history, social history and family history with the patient and they are unchanged from previous note.  ALLERGIES:  is allergic to codeine and multihance.  MEDICATIONS:  Current Outpatient Prescriptions  Medication Sig Dispense Refill  . ALPRAZolam (XANAX) 0.5 MG tablet Take 0.5 mg by mouth at bedtime as needed for anxiety.       Marland Kitchen anastrozole (ARIMIDEX) 1 MG tablet Take 1 tablet (1 mg total) by mouth daily.  30 tablet  3  . aspirin 81 MG chewable tablet Chew 81 mg by mouth.      . Cholecalciferol (VITAMIN D-3) 5000 UNITS TABS Take 2 each by mouth daily.      . diazepam (VALIUM) 5 MG tablet Take 5 mg by mouth.      . Omega-3 Fatty Acids (FISH OIL PO) Take 1 capsule by mouth every morning.      Marland Kitchen omeprazole (PRILOSEC) 20 MG capsule Take 20 mg by mouth.      . risperiDONE (RISPERDAL) 0.25 MG tablet Take 1 mg by mouth at bedtime.      . sertraline (ZOLOFT) 100 MG tablet Take 25 mg by mouth daily.      . simvastatin (ZOCOR) 20  MG tablet Take 20 mg by mouth every evening.       No current facility-administered medications for this visit.    PHYSICAL EXAMINATION: ECOG PERFORMANCE STATUS: 0 - Asymptomatic  Filed Vitals:   04/29/14 1228  BP: 144/77  Pulse: 53  Temp: 98.6 F (37 C)  Resp: 18   Filed Weights   04/29/14 1228  Weight: 213 lb 6 oz (96.786 kg)    GENERAL:alert, no distress and comfortable SKIN: skin color, texture, turgor are normal, no rashes or significant lesions EYES: normal,  Conjunctiva are pink and non-injected, sclera clear OROPHARYNX:no exudate, no erythema and lips, buccal mucosa, and tongue normal  NECK: supple, thyroid normal size, non-tender, without nodularity LYMPH:  no palpable lymphadenopathy in the cervical, axillary or inguinal LUNGS: clear to auscultation and percussion with normal breathing effort HEART: regular rate & rhythm and no murmurs and no lower extremity edema ABDOMEN:abdomen soft, non-tender and normal bowel sounds Musculoskeletal:no cyanosis of digits and no clubbing  NEURO: alert & oriented x 3 with fluent speech, no focal motor/sensory deficits   LABORATORY DATA:  I have reviewed the data as listed   Chemistry      Component Value Date/Time   NA 135 04/01/2014 1236   NA 142 02/18/2014 1104   K 3.9 04/01/2014 1236   K 3.9 02/18/2014 1104   CL 101 04/01/2014 1236   CO2 28 04/01/2014 1236   CO2 24 02/18/2014 1104   BUN 12 04/01/2014 1236   BUN 8.8 02/18/2014 1104   CREATININE 0.71 04/01/2014 1236   CREATININE 0.7 02/18/2014 1104      Component Value Date/Time   CALCIUM 9.1 04/01/2014 1236   CALCIUM 9.4 02/18/2014 1104   ALKPHOS 103 04/01/2014 1236   ALKPHOS 97 02/18/2014 1104   AST 19 04/01/2014 1236   AST 17 02/18/2014 1104   ALT 18 04/01/2014 1236   ALT 15 02/18/2014 1104   BILITOT 0.5 04/01/2014 1236   BILITOT 0.48 02/18/2014 1104       Lab Results  Component Value Date   WBC 4.6 04/01/2014   HGB 12.6 04/01/2014   HCT 39.7 04/01/2014   MCV 84.9 04/01/2014   PLT 200 04/01/2014   NEUTROABS 1.7 04/01/2014     RADIOGRAPHIC STUDIES: I have personally reviewed the radiology reports and agreed with their findings. No results found.   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 ER 99% PR 99% HER-2 negative: Intermediate risk Oncotype DX recurrence score 23. Patient received 4 cycles  of Taxotere Cytoxan chemotherapy. She had breast reconstruction subsequently in May 2015. Patient is here for routine followup breast exam and and it does not reveal any abnormalities in the breast. She will need to be scheduled for her annual mammogram on the left breast.  Survivorship: Discussed the importance of physical exercise in decreasing the likelihood of breast cancer recurrence. Recommended 30 mins daily 6 days a week of either brisk walking or cycling or swimming. Encouraged patient to eat more fruits and vegetables and decrease red meat.    return to clinic in 6 weeks or followup after undergoing mammograms on the left breast. 2. Hot flashes do to antiestrogen therapy: I encouraged the patient to do yoga and exercise in decreasing his and supported that is it for a while the symptoms tend to get easier.  Orders Placed This Encounter  Procedures  . MM Digital Diagnostic Unilat L    Standing Status: Future     Number of Occurrences:      Standing Expiration Date: 04/29/2015    Order Specific Question:  Reason for Exam (SYMPTOM  OR DIAGNOSIS REQUIRED)    Answer:  Right breast mastectomy for breast cancer, left breast annual followup    Order Specific Question:  Preferred imaging location?    Answer:  [facility]   The patient has a good understanding of the overall plan. she agrees with it. She will call with any problems that may develop before her next visit here.  I spent 25 minutes counseling the patient face to face. The total time spent in the appointment was 30 minutes and more than 50% was on counseling and review of test results    Rulon Eisenmenger, MD 04/29/2014 12:44 PM

## 2014-04-29 NOTE — Assessment & Plan Note (Signed)
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 ER 99% PR 99% HER-2 negative: Intermediate risk Oncotype DX recurrence score 23. Patient received 4 cycles of Taxotere Cytoxan chemotherapy. She had breast reconstruction subsequently in May 2015. Patient is here for routine followup breast exam and and it does not reveal any abnormalities in the breast. She will need to be scheduled for her annual mammogram on the left breast.  Survivorship: Discussed the importance of physical exercise in decreasing the likelihood of breast cancer recurrence. Recommended 30 mins daily 6 days a week of either brisk walking or cycling or swimming. Encouraged patient to eat more fruits and vegetables and decrease red meat.

## 2014-04-30 ENCOUNTER — Other Ambulatory Visit: Payer: Self-pay | Admitting: Hematology and Oncology

## 2014-04-30 DIAGNOSIS — Z9011 Acquired absence of right breast and nipple: Secondary | ICD-10-CM

## 2014-04-30 DIAGNOSIS — Z853 Personal history of malignant neoplasm of breast: Secondary | ICD-10-CM

## 2014-05-01 ENCOUNTER — Telehealth: Payer: Self-pay | Admitting: Hematology and Oncology

## 2014-05-01 NOTE — Telephone Encounter (Signed)
Morgan Santos pt and advised on OCT appt.Marland KitchenMarland KitchenMarland KitchenMarland Kitchenpt ok and aware

## 2014-05-20 ENCOUNTER — Ambulatory Visit: Payer: 59 | Admitting: Gastroenterology

## 2014-05-21 ENCOUNTER — Encounter: Payer: Self-pay | Admitting: Gastroenterology

## 2014-05-21 ENCOUNTER — Other Ambulatory Visit: Payer: Self-pay

## 2014-05-21 ENCOUNTER — Ambulatory Visit (INDEPENDENT_AMBULATORY_CARE_PROVIDER_SITE_OTHER): Payer: 59 | Admitting: Gastroenterology

## 2014-05-21 ENCOUNTER — Encounter (INDEPENDENT_AMBULATORY_CARE_PROVIDER_SITE_OTHER): Payer: Self-pay

## 2014-05-21 VITALS — BP 110/80 | HR 72 | Temp 97.2°F | Ht 69.5 in | Wt 214.0 lb

## 2014-05-21 DIAGNOSIS — Z800 Family history of malignant neoplasm of digestive organs: Secondary | ICD-10-CM

## 2014-05-21 DIAGNOSIS — Z1211 Encounter for screening for malignant neoplasm of colon: Secondary | ICD-10-CM

## 2014-05-21 DIAGNOSIS — K5900 Constipation, unspecified: Secondary | ICD-10-CM | POA: Insufficient documentation

## 2014-05-21 MED ORDER — PEG-KCL-NACL-NASULF-NA ASC-C 100 G PO SOLR: 1.0000 | ORAL | Status: DC

## 2014-05-21 MED ORDER — POLYETHYLENE GLYCOL 3350 17 GM/SCOOP PO POWD: ORAL | Status: DC

## 2014-05-21 NOTE — Assessment & Plan Note (Signed)
63 y/o female with acute on chronic constipation. Recommend Miralax one capful twice a day until adequate BM, then back down to daily prn. She is due for average risk screening colonoscopy. FH of two family members (not first degree) with colon cancer.  I have discussed the risks, alternatives, benefits with regards to but not limited to the risk of reaction to medication, bleeding, infection, perforation and the patient is agreeable to proceed. Written consent to be obtained.

## 2014-05-21 NOTE — Progress Notes (Signed)
cc'ed to pcp °

## 2014-05-21 NOTE — Patient Instructions (Signed)
1. Colonoscopy with Dr. Oneida Alar. See separate instructions. 2. Start Miralax for constipation. Take one capful twice per day for 3 days, then daily until regular BMs. Then continue once daily on days you do not have good BM.

## 2014-05-21 NOTE — Progress Notes (Signed)
Primary Care Physician:  [name], PA-C  Primary Gastroenterologist:  Barney Drain, MD   Chief Complaint  Patient presents with  . Colonoscopy  . Constipation    HPI:  Morgan Santos is a 63 y.o. female here requesting colonoscopy. Last one in 01/2014. FH of two relatives with colon cancer as outlined. Personal h/o of breast cancer diagnosed last year and recently finished all treatments.   Patient has had chronic constipation but worse in the last 6 months or so since chemotherapy. Percell Belt MOM once per month. BM 3 per week. Stools hard/strains. No brbpr, melena. Some gas pain. Heartburn ok on prilosec. Wonders if coughing is related to acid reflux. Happens randomly. No dysphagia. Diagnosed with GERD one year ago.     Current Outpatient Prescriptions  Medication Sig Dispense Refill  . ALPRAZolam (XANAX) 0.5 MG tablet Take 0.5 mg by mouth at bedtime as needed for anxiety.       Marland Kitchen anastrozole (ARIMIDEX) 1 MG tablet Take 1 tablet (1 mg total) by mouth daily.  30 tablet  3  . aspirin 81 MG chewable tablet Chew 81 mg by mouth.      . Calcium-Magnesium-Zinc 500-250-12.5 MG TABS Take by mouth 2 (two) times daily.      . Cholecalciferol (VITAMIN D-3) 5000 UNITS TABS Take 2 each by mouth daily.      . citalopram (CELEXA) 20 MG tablet Take 20 mg by mouth daily.      . Omega-3 Fatty Acids (FISH OIL PO) Take 1 capsule by mouth every morning.      Marland Kitchen omeprazole (PRILOSEC) 20 MG capsule Take 20 mg by mouth.       No current facility-administered medications for this visit.    Allergies as of 05/21/2014 - Review Complete 05/21/2014  Allergen Reaction Noted  . Codeine Nausea And Vomiting 06/19/2013  . Multihance [gadobenate] Nausea Only and Cough 06/18/2013    Past Medical History  Diagnosis Date  . Breast cancer   . Anxiety   . Depression     pt. remarks that she "takes respridal because if I don't take it I get angry"   . GERD (gastroesophageal reflux disease)     pt. uses  vinegar for heartburn, ususally once per week   . Arthritis     hip- R, back   . Hypertension     "when I get upset"  Not on medication  . Stroke ?2010 or 2011    2010, [facility] for stroke "mini stroke" short term memory problems since  . Anemia     when she was younger  . Constipation   . Complication of anesthesia   . PONV (postoperative nausea and vomiting)     "just for one day"  . Wears glasses   . Wears dentures     top    Past Surgical History  Procedure Laterality Date  . Cholecystectomy    . Breast surgery      for abcesses- removed from both breasts, many yrs. ago  . Mastectomy w/ sentinel node biopsy Right 07/12/2013    Procedure: MASTECTOMY WITH SENTINEL LYMPH NODE BIOPSY;  Surgeon: Rolm Bookbinder, MD;  Location: [place];  Service: General;  Laterality: Right;  . Breast reconstruction with placement of tissue expander and flex hd (acellular hydrated dermis) Right 07/12/2013    Procedure: RIGHT BREAST RECONSTRUCTION WITH PLACEMENT OF TISSUE EXPANDER AND FLEX HD TO RIGHT BREAST (ACELLULAR HYDRATED DERMIS);  Surgeon: Irene Limbo, MD;  Location: [facility]  OR;  Service: Clinical cytogeneticist;  Laterality: Right;  . Vaginal hysterectomy    . Colonoscopy   01/29/2004    NUR:A 7-8 mm polyp snared from the hepatic flexure.  Another 3 mm polyp was     cold snared from the sigmoid colon.External hemorrhoids, possible source of recent rectal bleeding  . Mastectomy    . Tubal ligation    . Dilation and curettage of uterus    . Portacath placement N/A 09/16/2013    Procedure: INSERTION PORT-A-CATH;  Surgeon: Rolm Bookbinder, MD;  Location: [place];  Service: General;  Laterality: N/A;  . Removal of bilateral tissue expanders with placement of bilateral breast implants Right 01/14/2014    Procedure: REMOVAL OF RIGHT TISSUE EXPANDERS WITH PLACEMENT OF PERMANENT BREAST IMPLANT;  Surgeon: Irene Limbo, MD;  Location: [place];  Service: Plastics;  Laterality: Right;  .  Mastopexy Left 01/14/2014    Procedure: LEFT BREAST MASTOPEXY FOR SYMMETRY ;  Surgeon: Irene Limbo, MD;  Location: [place];  Service: Plastics;  Laterality: Left;  . Port-a-cath removal Left 01/14/2014    Procedure: REMOVAL PORT-A-CATH;  Surgeon: Irene Limbo, MD;  Location: [place];  Service: Plastics;  Laterality: Left;    Family History  Problem Relation Age of Onset  . Hypertension Mother   . Diabetes type II Mother   . CVA Mother   . Heart disease Mother   . Alcoholism Father   . Pneumonia Father   . Colon cancer Maternal Aunt     age 61s  . Colon cancer Other     maternal great aunt    History   Social History  . Marital Status: Married    Spouse Name: N/A    Number of Children: 4  . Years of Education: N/A   Occupational History  . retired 2014    Social History Main Topics  . Smoking status: Former Smoker    Quit date: 09/14/1995  . Smokeless tobacco: Never Used  . Alcohol Use: Yes     Comment: once q 3 months  . Drug Use: Yes    Special: Marijuana     Comment: in the past  . Sexual Activity: Yes   Other Topics Concern  . Not on file   Social History Narrative  . No narrative on file      ROS:  General: Negative for anorexia, weight loss, fever, chills, fatigue, weakness. Eyes: Negative for vision changes.  ENT: Negative for hoarseness, difficulty swallowing , nasal congestion. CV: Negative for chest pain, angina, palpitations, dyspnea on exertion, peripheral edema.  Respiratory: Negative for dyspnea at rest, dyspnea on exertion, cough, sputum, wheezing.  GI: See history of present illness. GU:  Negative for dysuria, hematuria, urinary incontinence, urinary frequency, nocturnal urination.  MS: Negative for joint pain, low back pain.  Derm: Negative for rash or itching.  Neuro: Negative for weakness, abnormal sensation, seizure, frequent headaches, memory loss, confusion.  Psych: Negative for anxiety,  depression, suicidal ideation, hallucinations.  Endo: Negative for unusual weight change.  Heme: Negative for bruising or bleeding. Allergy: Negative for rash or hives.    Physical Examination:  BP 110/80  Pulse 72  Temp(Src) 97.2 F (36.2 C) (Oral)  Ht 5' 9.5" (1.765 m)  Wt 214 lb (97.07 kg)  BMI 31.16 kg/m2   General: Well-nourished, well-developed in no acute distress.  Head: Normocephalic, atraumatic.   Eyes: Conjunctiva pink, no icterus. Mouth: Oropharyngeal mucosa moist and pink , no lesions erythema  or exudate. Neck: Supple without thyromegaly, masses, or lymphadenopathy.  Lungs: Clear to auscultation bilaterally.  Heart: Regular rate and rhythm, no murmurs rubs or gallops.  Abdomen: Bowel sounds are normal, nontender, nondistended, no hepatosplenomegaly or masses, no abdominal bruits or    hernia , no rebound or guarding.   Rectal: deferred Extremities: No lower extremity edema. No clubbing or deformities.  Neuro: Alert and oriented x 4 , grossly normal neurologically.  Skin: Warm and dry, no rash or jaundice.   Psych: Alert and cooperative, normal mood and affect.  Labs: Lab Results  Component Value Date   WBC 4.6 04/01/2014   HGB 12.6 04/01/2014   HCT 39.7 04/01/2014   MCV 84.9 04/01/2014   PLT 200 04/01/2014   Lab Results  Component Value Date   CREATININE 0.71 04/01/2014   BUN 12 04/01/2014   NA 135 04/01/2014   K 3.9 04/01/2014   CL 101 04/01/2014   CO2 28 04/01/2014   Lab Results  Component Value Date   ALT 18 04/01/2014   AST 19 04/01/2014   ALKPHOS 103 04/01/2014   BILITOT 0.5 04/01/2014     Imaging Studies: No results found.

## 2014-05-27 ENCOUNTER — Ambulatory Visit
Admission: RE | Admit: 2014-05-27 | Discharge: 2014-05-27 | Disposition: A | Discharge status: Home / Self Care | Payer: 59 | Source: Ambulatory Visit | Attending: Hematology and Oncology | Admitting: Hematology and Oncology

## 2014-05-27 DIAGNOSIS — Z853 Personal history of malignant neoplasm of breast: Secondary | ICD-10-CM

## 2014-05-27 DIAGNOSIS — Z9011 Acquired absence of right breast and nipple: Secondary | ICD-10-CM

## 2014-05-29 ENCOUNTER — Encounter (HOSPITAL_COMMUNITY): Payer: Self-pay | Admitting: Pharmacy Technician

## 2014-06-05 ENCOUNTER — Ambulatory Visit (HOSPITAL_COMMUNITY)
Admission: RE | Admit: 2014-06-05 | Discharge: 2014-06-05 | Disposition: A | Discharge status: Home / Self Care | Payer: 59 | Source: Ambulatory Visit | Attending: Gastroenterology | Admitting: Gastroenterology

## 2014-06-05 ENCOUNTER — Encounter (HOSPITAL_COMMUNITY): Payer: Self-pay

## 2014-06-05 ENCOUNTER — Encounter (HOSPITAL_COMMUNITY)
Admission: RE | Disposition: A | Discharge status: Home / Self Care | Payer: Self-pay | Source: Ambulatory Visit | Attending: Gastroenterology

## 2014-06-05 DIAGNOSIS — D125 Benign neoplasm of sigmoid colon: Secondary | ICD-10-CM

## 2014-06-05 DIAGNOSIS — F419 Anxiety disorder, unspecified: Secondary | ICD-10-CM | POA: Diagnosis not present

## 2014-06-05 DIAGNOSIS — K6389 Other specified diseases of intestine: Secondary | ICD-10-CM | POA: Insufficient documentation

## 2014-06-05 DIAGNOSIS — F329 Major depressive disorder, single episode, unspecified: Secondary | ICD-10-CM | POA: Diagnosis not present

## 2014-06-05 DIAGNOSIS — Z79899 Other long term (current) drug therapy: Secondary | ICD-10-CM | POA: Diagnosis not present

## 2014-06-05 DIAGNOSIS — K219 Gastro-esophageal reflux disease without esophagitis: Secondary | ICD-10-CM | POA: Insufficient documentation

## 2014-06-05 DIAGNOSIS — D128 Benign neoplasm of rectum: Secondary | ICD-10-CM

## 2014-06-05 DIAGNOSIS — M479 Spondylosis, unspecified: Secondary | ICD-10-CM | POA: Diagnosis not present

## 2014-06-05 DIAGNOSIS — D649 Anemia, unspecified: Secondary | ICD-10-CM | POA: Diagnosis not present

## 2014-06-05 DIAGNOSIS — K648 Other hemorrhoids: Secondary | ICD-10-CM | POA: Insufficient documentation

## 2014-06-05 DIAGNOSIS — Z7982 Long term (current) use of aspirin: Secondary | ICD-10-CM | POA: Diagnosis not present

## 2014-06-05 DIAGNOSIS — Z1211 Encounter for screening for malignant neoplasm of colon: Secondary | ICD-10-CM

## 2014-06-05 DIAGNOSIS — D124 Benign neoplasm of descending colon: Secondary | ICD-10-CM | POA: Insufficient documentation

## 2014-06-05 DIAGNOSIS — Z885 Allergy status to narcotic agent status: Secondary | ICD-10-CM | POA: Insufficient documentation

## 2014-06-05 DIAGNOSIS — I10 Essential (primary) hypertension: Secondary | ICD-10-CM | POA: Insufficient documentation

## 2014-06-05 DIAGNOSIS — Z87891 Personal history of nicotine dependence: Secondary | ICD-10-CM | POA: Diagnosis not present

## 2014-06-05 DIAGNOSIS — M1611 Unilateral primary osteoarthritis, right hip: Secondary | ICD-10-CM | POA: Diagnosis not present

## 2014-06-05 HISTORY — PX: COLONOSCOPY: SHX5424

## 2014-06-05 SURGERY — COLONOSCOPY
Anesthesia: Moderate Sedation

## 2014-06-05 MED ORDER — PROMETHAZINE HCL 25 MG/ML IJ SOLN
12.5000 mg | Freq: Once | INTRAMUSCULAR | Status: AC
  Administered 2014-06-05: 12.5 mg via INTRAVENOUS

## 2014-06-05 MED ORDER — PROMETHAZINE HCL 25 MG/ML IJ SOLN
INTRAMUSCULAR | Status: AC
  Filled 2014-06-05: qty 1

## 2014-06-05 MED ORDER — MIDAZOLAM HCL 5 MG/5ML IJ SOLN
INTRAMUSCULAR | Status: DC | PRN
  Administered 2014-06-05 (×3): 2 mg via INTRAVENOUS

## 2014-06-05 MED ORDER — MEPERIDINE HCL 100 MG/ML IJ SOLN
INTRAMUSCULAR | Status: AC
  Filled 2014-06-05: qty 2

## 2014-06-05 MED ORDER — MIDAZOLAM HCL 5 MG/5ML IJ SOLN
INTRAMUSCULAR | Status: AC
  Filled 2014-06-05: qty 10

## 2014-06-05 MED ORDER — STERILE WATER FOR IRRIGATION IR SOLN
Status: DC | PRN
  Administered 2014-06-05: 11:00:00

## 2014-06-05 MED ORDER — SODIUM CHLORIDE 0.9 % IJ SOLN
INTRAMUSCULAR | Status: AC
  Filled 2014-06-05: qty 10

## 2014-06-05 MED ORDER — SODIUM CHLORIDE 0.9 % IV SOLN
INTRAVENOUS | Status: DC
  Administered 2014-06-05: 10:00:00 via INTRAVENOUS

## 2014-06-05 MED ORDER — MEPERIDINE HCL 100 MG/ML IJ SOLN
INTRAMUSCULAR | Status: DC | PRN
  Administered 2014-06-05 (×2): 25 mg via INTRAVENOUS
  Administered 2014-06-05: 50 mg via INTRAVENOUS

## 2014-06-05 NOTE — Discharge Instructions (Signed)
You had 9 polyps removed. You have internal hemorrhoids.   CONTINUE YOUR WEIGHT LOSS EFFORTS. You should lose 10 lbs. YOUR BODY MASS INDEX IS OVER 30 WHICH MEANS YOU ARE OBESE. OBESITY IS ASSOCIATED WITH AN INCREASE FOR ALL CANCERS, INCLUDING COLON CANCER.  FOLLOW A HIGH FIBER DIET. AVOID ITEMS THAT CAUSE BLOATING & GAS. SEE INFO BELOW.  YOUR BIOPSY RESULTS SHOULD BE BACK IN 14 DAYS.  Next colonoscopy in 3-10 years.  Colonoscopy Care After Read the instructions outlined below and refer to this sheet in the next week. These discharge instructions provide you with general information on caring for yourself after you leave the hospital. While your treatment has been planned according to the most current medical practices available, unavoidable complications occasionally occur. If you have any problems or questions after discharge, call DR. Guilherme Schwenke, [phone].  ACTIVITY  You may resume your regular activity, but move at a slower pace for the next 24 hours.   Take frequent rest periods for the next 24 hours.   Walking will help get rid of the air and reduce the bloated feeling in your belly (abdomen).   No driving for 24 hours (because of the medicine (anesthesia) used during the test).   You may shower.   Do not sign any important legal documents or operate any machinery for 24 hours (because of the anesthesia used during the test).    NUTRITION  Drink plenty of fluids.   You may resume your normal diet as instructed by your doctor.   Begin with a light meal and progress to your normal diet. Heavy or fried foods are harder to digest and may make you feel sick to your stomach (nauseated).   Avoid alcoholic beverages for 24 hours or as instructed.    MEDICATIONS  You may resume your normal medications.   WHAT YOU CAN EXPECT TODAY  Some feelings of bloating in the abdomen.   Passage of more gas than usual.   Spotting of blood in your stool or on the toilet paper  .    IF YOU HAD POLYPS REMOVED DURING THE COLONOSCOPY:  Eat a soft diet IF YOU HAVE NAUSEA, BLOATING, ABDOMINAL PAIN, OR VOMITING.    FINDING OUT THE RESULTS OF YOUR TEST Not all test results are available during your visit. DR. Oneida Alar WILL CALL YOU WITHIN 14 DAYS OF YOUR PROCEDUE WITH YOUR RESULTS. Do not assume everything is normal if you have not heard from DR. Tahj Njoku IN ONE WEEK, CALL HER OFFICE AT [phone].  SEEK IMMEDIATE MEDICAL ATTENTION AND CALL THE OFFICE: [phone] IF:  You have more than a spotting of blood in your stool.   Your belly is swollen (abdominal distention).   You are nauseated or vomiting.   You have a temperature over 101F.   You have abdominal pain or discomfort that is severe or gets worse throughout the day.   High-Fiber Diet A high-fiber diet changes your normal diet to include more whole grains, legumes, fruits, and vegetables. Changes in the diet involve replacing refined carbohydrates with unrefined foods. The calorie level of the diet is essentially unchanged. The Dietary Reference Intake (recommended amount) for adult males is 38 grams per day. For adult females, it is 25 grams per day. Pregnant and lactating women should consume 28 grams of fiber per day. Fiber is the intact part of a plant that is not broken down during digestion. Functional fiber is fiber that has been isolated from the plant to provide a beneficial effect  in the body. PURPOSE  Increase stool bulk.   Ease and regulate bowel movements.   Lower cholesterol.  INDICATIONS THAT YOU NEED MORE FIBER  Constipation and hemorrhoids.   Uncomplicated diverticulosis (intestine condition) and irritable bowel syndrome.   Weight management.   As a protective measure against hardening of the arteries (atherosclerosis), diabetes, and cancer.   GUIDELINES FOR INCREASING FIBER IN THE DIET  Start adding fiber to the diet slowly. A gradual increase of about 5 more grams (2 slices of  whole-wheat bread, 2 servings of most fruits or vegetables, or 1 bowl of high-fiber cereal) per day is best. Too rapid an increase in fiber may result in constipation, flatulence, and bloating.   Drink enough water and fluids to keep your urine clear or pale yellow. Water, juice, or caffeine-free drinks are recommended. Not drinking enough fluid may cause constipation.   Eat a variety of high-fiber foods rather than one type of fiber.   Try to increase your intake of fiber through using high-fiber foods rather than fiber pills or supplements that contain small amounts of fiber.   The goal is to change the types of food eaten. Do not supplement your present diet with high-fiber foods, but replace foods in your present diet.  INCLUDE A VARIETY OF FIBER SOURCES  Replace refined and processed grains with whole grains, canned fruits with fresh fruits, and incorporate other fiber sources. White rice, white breads, and most bakery goods contain little or no fiber.   Brown whole-grain rice, buckwheat oats, and many fruits and vegetables are all good sources of fiber. These include: broccoli, Brussels sprouts, cabbage, cauliflower, beets, sweet potatoes, white potatoes (skin on), carrots, tomatoes, eggplant, squash, berries, fresh fruits, and dried fruits.   Cereals appear to be the richest source of fiber. Cereal fiber is found in whole grains and bran. Bran is the fiber-rich outer coat of cereal grain, which is largely removed in refining. In whole-grain cereals, the bran remains. In breakfast cereals, the largest amount of fiber is found in those with "bran" in their names. The fiber content is sometimes indicated on the label.   You may need to include additional fruits and vegetables each day.   In baking, for 1 cup white flour, you may use the following substitutions:   1 cup whole-wheat flour minus 2 tablespoons.   1/2 cup white flour plus 1/2 cup whole-wheat flour.   Polyps, Colon  A polyp  is extra tissue that grows inside your body. Colon polyps grow in the large intestine. The large intestine, also called the colon, is part of your digestive system. It is a long, hollow tube at the end of your digestive tract where your body makes and stores stool. Most polyps are not dangerous. They are benign. This means they are not cancerous. But over time, some types of polyps can turn into cancer. Polyps that are smaller than a pea are usually not harmful. But larger polyps could someday become or may already be cancerous. To be safe, doctors remove all polyps and test them.   WHO GETS POLYPS? Anyone can get polyps, but certain people are more likely than others. You may have a greater chance of getting polyps if:  You are over 50.   You have had polyps before.   Someone in your family has had polyps.   Someone in your family has had cancer of the large intestine.   Find out if someone in your family has had polyps. You may  also be more likely to get polyps if you:   Eat a lot of fatty foods   Smoke   Drink alcohol   Do not exercise  Eat too much   TREATMENT  The caregiver will remove the polyp during sigmoidoscopy or colonoscopy.    PREVENTION There is not one sure way to prevent polyps. You might be able to lower your risk of getting them if you:  Eat more fruits and vegetables and less fatty food.   Do not smoke.   Avoid alcohol.   Exercise every day.   Lose weight if you are overweight.   Eating more calcium and folate can also lower your risk of getting polyps. Some foods that are rich in calcium are milk, cheese, and broccoli. Some foods that are rich in folate are chickpeas, kidney beans, and spinach.   Hemorrhoids Hemorrhoids are dilated (enlarged) veins around the rectum. Sometimes clots will form in the veins. This makes them swollen and painful. These are called thrombosed hemorrhoids. Causes of hemorrhoids include:  Constipation.   Straining to have a  bowel movement.   HEAVY LIFTING HOME CARE INSTRUCTIONS  Eat a well balanced diet and drink 6 to 8 glasses of water every day to avoid constipation. You may also use a bulk laxative.   Avoid straining to have bowel movements.   Keep anal area dry and clean.   Do not use a donut shaped pillow or sit on the toilet for long periods. This increases blood pooling and pain.   Move your bowels when your body has the urge; this will require less straining and will decrease pain and pressure.

## 2014-06-05 NOTE — H&P (Signed)
Primary Care Physician:  [name], PA-C Primary Gastroenterologist:  Dr. Oneida Alar  Pre-Procedure History & Physical: HPI:  Morgan Santos is a 63 y.o. female here for [place].  Past Medical History  Diagnosis Date  . Breast cancer   . Anxiety   . Depression     pt. remarks that she "takes respridal because if I don't take it I get angry"   . GERD (gastroesophageal reflux disease)     pt. uses vinegar for heartburn, ususally once per week   . Arthritis     hip- R, back   . Hypertension     "when I get upset"  Not on medication  . Stroke ?2010 or 2011    2010, [facility] for stroke "mini stroke" short term memory problems since  . Anemia     when she was younger  . Constipation   . Complication of anesthesia   . PONV (postoperative nausea and vomiting)     "just for one day"  . Wears glasses   . Wears dentures     top    Past Surgical History  Procedure Laterality Date  . Cholecystectomy    . Breast surgery      for abcesses- removed from both breasts, many yrs. ago  . Mastectomy w/ sentinel node biopsy Right 07/12/2013    Procedure: MASTECTOMY WITH SENTINEL LYMPH NODE BIOPSY;  Surgeon: Rolm Bookbinder, MD;  Location: [place];  Service: General;  Laterality: Right;  . Breast reconstruction with placement of tissue expander and flex hd (acellular hydrated dermis) Right 07/12/2013    Procedure: RIGHT BREAST RECONSTRUCTION WITH PLACEMENT OF TISSUE EXPANDER AND FLEX HD TO RIGHT BREAST (ACELLULAR HYDRATED DERMIS);  Surgeon: Irene Limbo, MD;  Location: [place];  Service: Plastics;  Laterality: Right;  . Vaginal hysterectomy    . Colonoscopy   01/29/2004    NUR:A 7-8 mm polyp snared from the hepatic flexure.  Another 3 mm polyp was     cold snared from the sigmoid colon.External hemorrhoids, possible source of recent rectal bleeding  . Mastectomy    . Tubal ligation    . Dilation and curettage of uterus    . Portacath placement N/A 09/16/2013   Procedure: INSERTION PORT-A-CATH;  Surgeon: Rolm Bookbinder, MD;  Location: [facility];  Service: General;  Laterality: N/A;  . Removal of bilateral tissue expanders with placement of bilateral breast implants Right 01/14/2014    Procedure: REMOVAL OF RIGHT TISSUE EXPANDERS WITH PLACEMENT OF PERMANENT BREAST IMPLANT;  Surgeon: Irene Limbo, MD;  Location: [place];  Service: Plastics;  Laterality: Right;  . Mastopexy Left 01/14/2014    Procedure: LEFT BREAST MASTOPEXY FOR SYMMETRY ;  Surgeon: Irene Limbo, MD;  Location: [place];  Service: Plastics;  Laterality: Left;  . Port-a-cath removal Left 01/14/2014    Procedure: REMOVAL PORT-A-CATH;  Surgeon: Irene Limbo, MD;  Location: [place];  Service: Plastics;  Laterality: Left;    Prior to Admission medications   Medication Sig Start Date End Date Taking? Authorizing Provider  ALPRAZolam Duanne Moron) 0.5 MG tablet Take 0.5 mg by mouth at bedtime as needed for anxiety.    Yes Historical Provider, MD  anastrozole (ARIMIDEX) 1 MG tablet Take 1 tablet (1 mg total) by mouth daily. 04/01/14  Yes Minette Headland, NP  aspirin 81 MG chewable tablet Chew 81 mg by mouth.   Yes Historical Provider, MD  Calcium-Magnesium-Zinc 500-250-12.5 MG TABS Take by mouth  2 (two) times daily.   Yes Historical Provider, MD  Cholecalciferol (VITAMIN D-3) 5000 UNITS TABS Take 2 each by mouth daily.   Yes Historical Provider, MD  citalopram (CELEXA) 20 MG tablet Take 20 mg by mouth daily.   Yes Historical Provider, MD  Omega-3 Fatty Acids (FISH OIL PO) Take 1 capsule by mouth every morning.   Yes Historical Provider, MD  omeprazole (PRILOSEC) 20 MG capsule Take 20 mg by mouth.   Yes Historical Provider, MD  peg 3350 powder (MOVIPREP) 100 G SOLR Take 1 kit (200 g total) by mouth as directed. 05/21/14  Yes Danie Binder, MD  polyethylene glycol powder (GLYCOLAX/MIRALAX) powder Take one capful twice a day for 3 days, then  take once daily as needed for constipation. 05/21/14  Yes Mahala Menghini, PA-C    Allergies as of 05/21/2014 - Review Complete 05/21/2014  Allergen Reaction Noted  . Codeine Nausea And Vomiting 06/19/2013  . Multihance [gadobenate] Nausea Only and Cough 06/18/2013    Family History  Problem Relation Age of Onset  . Hypertension Mother   . Diabetes type II Mother   . CVA Mother   . Heart disease Mother   . Alcoholism Father   . Pneumonia Father   . Colon cancer Maternal Aunt     age 59s  . Colon cancer Other     maternal great aunt    History   Social History  . Marital Status: Married    Spouse Name: N/A    Number of Children: 4  . Years of Education: N/A   Occupational History  . retired 2014    Social History Main Topics  . Smoking status: Former Smoker    Quit date: 09/14/1995  . Smokeless tobacco: Never Used  . Alcohol Use: Yes     Comment: once q 3 months  . Drug Use: Yes    Special: Marijuana     Comment: in the past  . Sexual Activity: Yes   Other Topics Concern  . Not on file   Social History Narrative  . No narrative on file    Review of Systems: See HPI, otherwise negative ROS   Physical Exam: BP 147/68  Pulse 58  Temp(Src) 97.5 F (36.4 C) (Oral)  Resp 15  SpO2 100% General:   Alert,  pleasant and cooperative in NAD Head:  Normocephalic and atraumatic. Neck:  Supple; Lungs:  Clear throughout to auscultation.    Heart:  Regular rate and rhythm. Abdomen:  Soft, nontender and nondistended. Normal bowel sounds, without guarding, and without rebound.   Neurologic:  Alert and  oriented x4;  grossly normal neurologically.  Impression/Plan:      SCREENING  Plan:  1. TCS TODAY

## 2014-06-05 NOTE — Progress Notes (Signed)
REVIEWED-NO ADDITIONAL RECOMMENDATIONS. 

## 2014-06-08 NOTE — Op Note (Signed)
[facility] [address] [place], [address]   COLONOSCOPY PROCEDURE REPORT  PATIENT: Morgan, Santos  MR#: [id number] BIRTHDATE: 10-07-1950 , 29  yrs. old GENDER: female ENDOSCOPIST: Barney Drain, MD REFERRED GG:EZMOQ Ronnald Ramp, PA-C PROCEDURE DATE:  06/05/2014 PROCEDURE:   Colonoscopy with snare polypectomy and with cold biopsy polypectomy INDICATIONS:average risk for colon cancer. MEDICATIONS: Promethazine (Phenergan) 12.5 mg IV, Demerol 100 mg IV, and Versed 6 mg IV  DESCRIPTION OF PROCEDURE:    Physical exam was performed.  Informed consent was obtained from the patient after explaining the benefits, risks, and alternatives to procedure.  The patient was connected to monitor and placed in left lateral position. Continuous oxygen was provided by nasal cannula and IV medicine administered through an indwelling cannula.  After administration of sedation and rectal exam, the patients rectum was intubated and the EC-[id number] ([id number])  colonoscope was advanced under direct visualization to the ileum.  The scope was removed slowly by carefully examining the color, texture, anatomy, and integrity mucosa on the way out.  The patient was recovered in endoscopy and discharged home in satisfactory condition.    COLON FINDINGS: Eight sessile polyps ranging between 3-81mm in size were found in the sigmoid colon and descending colon.  A polypectomy was performed with cold forceps.  , A sessile polyp measuring 6 mm in size was found in the rectum.  A polypectomy was performed using snare cautery.  , The examined terminal ileum appeared to be normal.  , The colon was redundant.  Manual abdominal counter-pressure was used to reach the cecum.  The patient was moved on to their back to reach the cecum, Large internal hemorrhoids were found.  , and MELANOSIS COLI.  PREP QUALITY: good.  CECAL W/D TIME: 23 MINS          COMPLICATIONS: None  ENDOSCOPIC IMPRESSION: 1.   9 COLON  POLYPS REMOVED 2.   The colon was redundant 3.   Large internal hemorrhoids 4.   MELANOSIS COLI  RECOMMENDATIONS: CONTINUE YOUR WEIGHT LOSS EFFORTS.  Lose 10 lbs. FOLLOW A HIGH FIBER DIET.  AVOID ITEMS THAT CAUSE BLOATING & GAS. BIOPSY RESULTS SHOULD BE BACK IN 14 DAYS. Next colonoscopy in 3-10 years WITH AN OVERTUBE & PHENERGAN IN PREOP.   _______________________________ eSignedBarney Drain, MD June 27, 2014 11:06 AM   CPT CODES: ICD CODES:  The ICD and CPT codes recommended by this software are interpretations from the data that the clinical staff has captured with the software.  The verification of the translation of this report to the ICD and CPT codes and modifiers is the sole responsibility of the health care institution and practicing physician where this report was generated.  [employer] will not be held responsible for the validity of the ICD and CPT codes included on this report.  AMA assumes no liability for data contained or not contained herein. C[employer] is a Designer, television/film set of the [employer].

## 2014-06-09 ENCOUNTER — Encounter (HOSPITAL_COMMUNITY): Payer: Self-pay | Admitting: Gastroenterology

## 2014-06-13 ENCOUNTER — Ambulatory Visit (HOSPITAL_BASED_OUTPATIENT_CLINIC_OR_DEPARTMENT_OTHER): Payer: 59 | Admitting: Hematology and Oncology

## 2014-06-13 ENCOUNTER — Telehealth: Payer: Self-pay | Admitting: Hematology and Oncology

## 2014-06-13 VITALS — BP 106/67 | HR 61 | Temp 97.6°F | Resp 18 | Ht 69.0 in | Wt 213.7 lb

## 2014-06-13 DIAGNOSIS — N951 Menopausal and female climacteric states: Secondary | ICD-10-CM

## 2014-06-13 DIAGNOSIS — C50811 Malignant neoplasm of overlapping sites of right female breast: Secondary | ICD-10-CM

## 2014-06-13 DIAGNOSIS — C50411 Malignant neoplasm of upper-outer quadrant of right female breast: Secondary | ICD-10-CM

## 2014-06-13 NOTE — Progress Notes (Signed)
Patient Care Team: [name], PA-C as PCP - General ([place]) Amada Kingfisher, MD as Consulting Physician (Hematology and Oncology) Danie Binder, MD as Consulting Physician (Gastroenterology)  DIAGNOSIS: Breast cancer of upper-outer quadrant of right female breast   Primary site: Breast (Right)   Staging method: AJCC 7th Edition   Clinical: Stage IA (T1c, N0, cM0)   Pathologic: Stage IA (T1c, N0, cM0) signed by Rulon Eisenmenger, MD on 04/29/2014 12:19 PM   Summary: Stage IA (T1c, N0, cM0)   Clinical comments: Staged at breast conference 10.29.14   SUMMARY OF ONCOLOGIC HISTORY:   Breast cancer of upper-outer quadrant of right female breast   04/29/2013 Mammogram Right breast mass, 7 mm lesion at 9:00 position, IDC with DCIS grade 2 ER  PR 99% positive HER-2 negative Ki-67 11%: Second mass IDC ER/PR positive HER-2 negative Ki-67 11%, MRI 6 cm including enhancement    07/12/2013 Surgery Right breast mastectomy: IDC grade 1, 2 foci 1.2 and 1.1 cm, LVI positive, 2 SLN negative, Oncotype DX score 23 intermediate risk, ROR. 14%   09/26/2013 - 11/28/2013 Chemotherapy Adjuvant Taxotere and Cytoxan x4 cycles   01/14/2014 Surgery Right breast reconstruction and right port removal    CHIEF COMPLIANT: Followup after recent mammograms  INTERVAL HISTORY: Morgan Santos is a 63 year old American lady with above-mentioned history of right-sided breast cancer treated with mastectomy followed by adjuvant chemotherapy and reconstruction. She is currently on Arimidex therapy and tolerating it fairly well except for hot flashes. Most often happens at night. She does not think they're severe enough to require any therapy. She denies any new lumps or nodules. She is yet to discuss the results of the recent mammogram.  REVIEW OF SYSTEMS:   Constitutional: Denies fevers, chills or abnormal weight loss Eyes: Denies blurriness of vision Ears, nose, mouth, throat, and face: Denies mucositis or sore  throat Respiratory: Denies cough, dyspnea or wheezes Cardiovascular: Denies palpitation, chest discomfort or lower extremity swelling Gastrointestinal:  Denies nausea, heartburn or change in bowel habits Skin: Denies abnormal skin rashes Lymphatics: Denies new lymphadenopathy or easy bruising Neurological:Denies numbness, tingling or new weaknesses Behavioral/Psych: Mood is stable, no new changes  Breast:  denies any pain or lumps or nodules in either breasts All other systems were reviewed with the patient and are negative.  I have reviewed the past medical history, past surgical history, social history and family history with the patient and they are unchanged from previous note.  ALLERGIES:  is allergic to codeine and multihance.  MEDICATIONS:  Current Outpatient Prescriptions  Medication Sig Dispense Refill  . ALPRAZolam (XANAX) 0.5 MG tablet Take 0.5 mg by mouth at bedtime as needed for anxiety.       Marland Kitchen anastrozole (ARIMIDEX) 1 MG tablet Take 1 tablet (1 mg total) by mouth daily.  30 tablet  3  . aspirin 81 MG chewable tablet Chew 81 mg by mouth.      . Calcium-Magnesium-Zinc 500-250-12.5 MG TABS Take by mouth 2 (two) times daily.      . Cholecalciferol (VITAMIN D-3) 5000 UNITS TABS Take 2 each by mouth daily.      . citalopram (CELEXA) 20 MG tablet Take 20 mg by mouth daily.      . Omega-3 Fatty Acids (FISH OIL PO) Take 1 capsule by mouth every morning.      Marland Kitchen omeprazole (PRILOSEC) 20 MG capsule Take 20 mg by mouth.      . polyethylene glycol powder (GLYCOLAX/MIRALAX) powder Take one  capful twice a day for 3 days, then take once daily as needed for constipation.  527 g  3   No current facility-administered medications for this visit.    PHYSICAL EXAMINATION: ECOG PERFORMANCE STATUS: 0 - Asymptomatic  Filed Vitals:   06/13/14 1058  BP: 106/67  Pulse: 61  Temp: 97.6 F (36.4 C)  Resp: 18   Filed Weights   06/13/14 1058  Weight: 213 lb 11.2 oz (96.934 kg)     GENERAL:alert, no distress and comfortable SKIN: skin color, texture, turgor are normal, no rashes or significant lesions EYES: normal, Conjunctiva are pink and non-injected, sclera clear OROPHARYNX:no exudate, no erythema and lips, buccal mucosa, and tongue normal  NECK: supple, thyroid normal size, non-tender, without nodularity LYMPH:  no palpable lymphadenopathy in the cervical, axillary or inguinal LUNGS: clear to auscultation and percussion with normal breathing effort HEART: regular rate & rhythm and no murmurs and no lower extremity edema ABDOMEN:abdomen soft, non-tender and normal bowel sounds Musculoskeletal:no cyanosis of digits and no clubbing  NEURO: alert & oriented x 3 with fluent speech, no focal motor/sensory deficits  LABORATORY DATA:  I have reviewed the data as listed   Chemistry      Component Value Date/Time   NA 135 04/01/2014 1236   NA 142 02/18/2014 1104   K 3.9 04/01/2014 1236   K 3.9 02/18/2014 1104   CL 101 04/01/2014 1236   CO2 28 04/01/2014 1236   CO2 24 02/18/2014 1104   BUN 12 04/01/2014 1236   BUN 8.8 02/18/2014 1104   CREATININE 0.71 04/01/2014 1236   CREATININE 0.7 02/18/2014 1104      Component Value Date/Time   CALCIUM 9.1 04/01/2014 1236   CALCIUM 9.4 02/18/2014 1104   ALKPHOS 103 04/01/2014 1236   ALKPHOS 97 02/18/2014 1104   AST 19 04/01/2014 1236   AST 17 02/18/2014 1104   ALT 18 04/01/2014 1236   ALT 15 02/18/2014 1104   BILITOT 0.5 04/01/2014 1236   BILITOT 0.48 02/18/2014 1104       Lab Results  Component Value Date   WBC 4.6 04/01/2014   HGB 12.6 04/01/2014   HCT 39.7 04/01/2014   MCV 84.9 04/01/2014   PLT 200 04/01/2014   NEUTROABS 1.7 04/01/2014     RADIOGRAPHIC STUDIES: I have personally reviewed the radiology reports and agreed with their findings. No results found.   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 ER 99% PR 99% HER-2 negative: Intermediate risk Oncotype DX recurrence score 23. Patient received 4 cycles of Taxotere Cytoxan chemotherapy. She had breast reconstruction subsequently in May 2015  Surveillance: I reviewed the mammogram report on the left breast which was normal. Physical exam was also normal. Return to clinic in 6 months for followup.  Hot flashes related to Arimidex: Days hot flashes and not severe enough for her to require medication. I encouraged her to stay active and do exercise as a means to minimize the degree of hot flashes. She also feels somewhat fatigued and not as much energy since she started Arimidex. I discussed with her that staying active he is very important to be gaining her strength and energy.   Orders Placed This Encounter  Procedures  . CBC with Differential    Standing Status: Future     Number of Occurrences:  Standing Expiration Date: 06/13/2015  . Comprehensive metabolic panel (Cmet) - [facility]    Standing Status: Future     Number of Occurrences:      Standing Expiration Date: 06/13/2015   The patient has a good understanding of the overall plan. she agrees with it. She will call with any problems that may develop before her next visit here.  I spent 15 minutes counseling the patient face to face. The total time spent in the appointment was 15 minutes and more than 50% was on counseling and review of test results    Rulon Eisenmenger, MD 06/13/2014 11:14 AM

## 2014-06-13 NOTE — Telephone Encounter (Signed)
gv pt appt schedule for april 2016.  °

## 2014-06-13 NOTE — Assessment & Plan Note (Signed)
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 ER 99% PR 99% HER-2 negative: Intermediate risk Oncotype DX recurrence score 23. Patient received 4 cycles of Taxotere Cytoxan chemotherapy. She had breast reconstruction subsequently in May 2015  Surveillance: I reviewed the mammogram report on the left breast which was normal. Physical exam was also normal. Return to clinic in 6 months for followup.  Hot flashes related to Arimidex: Days hot flashes and not severe enough for her to require medication. I encouraged her to stay active and do exercise as a means to minimize the degree of hot flashes. She also feels somewhat fatigued and not as much energy since she started Arimidex. I discussed with her that staying active he is very important to be gaining her strength and energy.

## 2014-06-17 ENCOUNTER — Telehealth: Payer: Self-pay | Admitting: Gastroenterology

## 2014-06-17 NOTE — Telephone Encounter (Signed)
LMOM to call.

## 2014-06-17 NOTE — Telephone Encounter (Addendum)
Please call pt. She had simple adenomas removed. CONTINUE YOUR WEIGHT LOSS EFFORTS. FOLLOW A HIGH FIBER DIET. TCS IN 3 YEARS with an overtube/phenergan in preop.

## 2014-06-17 NOTE — Telephone Encounter (Signed)
Reminder in epic °

## 2014-06-18 NOTE — Telephone Encounter (Signed)
Pt is aware of results. 

## 2014-06-18 NOTE — Telephone Encounter (Signed)
LMOM for a return call.  Mailing a letter also.  

## 2014-06-24 ENCOUNTER — Telehealth: Payer: Self-pay

## 2014-06-24 NOTE — Telephone Encounter (Signed)
Pt called because she got letter in the mail to call for results, but she had called after I mailed the letter and Ginger had given her the results. She is aware.

## 2014-08-02 ENCOUNTER — Other Ambulatory Visit: Payer: Self-pay | Admitting: Adult Health

## 2014-08-04 ENCOUNTER — Other Ambulatory Visit: Payer: Self-pay | Admitting: *Deleted

## 2014-08-04 DIAGNOSIS — C50411 Malignant neoplasm of upper-outer quadrant of right female breast: Secondary | ICD-10-CM

## 2014-08-04 MED ORDER — ANASTROZOLE 1 MG PO TABS: 1.0000 mg | ORAL_TABLET | Freq: Every day | ORAL | Status: DC

## 2014-12-09 ENCOUNTER — Other Ambulatory Visit (HOSPITAL_BASED_OUTPATIENT_CLINIC_OR_DEPARTMENT_OTHER): Payer: 59

## 2014-12-09 ENCOUNTER — Ambulatory Visit (HOSPITAL_BASED_OUTPATIENT_CLINIC_OR_DEPARTMENT_OTHER): Payer: 59 | Admitting: Hematology and Oncology

## 2014-12-09 ENCOUNTER — Telehealth: Payer: Self-pay | Admitting: Hematology and Oncology

## 2014-12-09 VITALS — BP 130/67 | HR 63 | Temp 97.7°F | Resp 18 | Ht 69.0 in | Wt 226.2 lb

## 2014-12-09 DIAGNOSIS — C50411 Malignant neoplasm of upper-outer quadrant of right female breast: Secondary | ICD-10-CM

## 2014-12-09 DIAGNOSIS — Z170 Estrogen receptor positive status [ER+]: Secondary | ICD-10-CM | POA: Diagnosis not present

## 2014-12-09 DIAGNOSIS — N951 Menopausal and female climacteric states: Secondary | ICD-10-CM

## 2014-12-09 DIAGNOSIS — R5383 Other fatigue: Secondary | ICD-10-CM | POA: Diagnosis not present

## 2014-12-09 LAB — CBC WITH DIFFERENTIAL/PLATELET
BASO%: 0.6 % (ref 0.0–2.0)
BASOS ABS: 0 10*3/uL (ref 0.0–0.1)
EOS%: 2.1 % (ref 0.0–7.0)
Eosinophils Absolute: 0.1 10*3/uL (ref 0.0–0.5)
HCT: 41.6 % (ref 34.8–46.6)
HEMOGLOBIN: 13.8 g/dL (ref 11.6–15.9)
LYMPH%: 41.3 % (ref 14.0–49.7)
MCH: 29.7 pg (ref 25.1–34.0)
MCHC: 33.2 g/dL (ref 31.5–36.0)
MCV: 89.7 fL (ref 79.5–101.0)
MONO#: 0.4 10*3/uL (ref 0.1–0.9)
MONO%: 7.2 % (ref 0.0–14.0)
NEUT%: 48.8 % (ref 38.4–76.8)
NEUTROS ABS: 2.6 10*3/uL (ref 1.5–6.5)
PLATELETS: 189 10*3/uL (ref 145–400)
RBC: 4.64 10*6/uL (ref 3.70–5.45)
RDW: 12.9 % (ref 11.2–14.5)
WBC: 5.3 10*3/uL (ref 3.9–10.3)
lymph#: 2.2 10*3/uL (ref 0.9–3.3)

## 2014-12-09 LAB — COMPREHENSIVE METABOLIC PANEL (CC13)
ALT: 25 U/L (ref 0–55)
ANION GAP: 12 meq/L — AB (ref 3–11)
AST: 26 U/L (ref 5–34)
Albumin: 3.7 g/dL (ref 3.5–5.0)
Alkaline Phosphatase: 141 U/L (ref 40–150)
BILIRUBIN TOTAL: 0.62 mg/dL (ref 0.20–1.20)
BUN: 15.3 mg/dL (ref 7.0–26.0)
CALCIUM: 9.2 mg/dL (ref 8.4–10.4)
CO2: 23 meq/L (ref 22–29)
Chloride: 106 mEq/L (ref 98–109)
Creatinine: 0.7 mg/dL (ref 0.6–1.1)
GLUCOSE: 91 mg/dL (ref 70–140)
Potassium: 4.1 mEq/L (ref 3.5–5.1)
SODIUM: 141 meq/L (ref 136–145)
TOTAL PROTEIN: 6.7 g/dL (ref 6.4–8.3)

## 2014-12-09 NOTE — Addendum Note (Signed)
Addended by: Prentiss Bells on: 12/09/2014 02:35 PM   Modules accepted: Orders

## 2014-12-09 NOTE — Assessment & Plan Note (Signed)
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 ER 99% PR 99% HER-2 negative: Intermediate risk Oncotype DX recurrence score 23. Patient received 4 cycles of Taxotere Cytoxan chemotherapy. She had breast reconstruction subsequently in May 2015  Surveillance:  1. Breast exam 12/09/2014 did not reveal any lumps or nodules 2. Mammogram 05/27/2014 is normal 3. Bone density April 2015 showed T score -0.6  Arimidex toxicities: 1. Hot flashes but not severe 2. Fatigue  Survivorship:Discussed the importance of physical exercise in decreasing the likelihood of breast cancer recurrence. Recommended 30 mins daily 6 days a week of either brisk walking or cycling or swimming. Encouraged patient to eat more fruits and vegetables and decrease red meat.

## 2014-12-09 NOTE — Telephone Encounter (Signed)
Spoke with patient and she is aware of her 34mo appointment

## 2014-12-09 NOTE — Progress Notes (Signed)
Patient Care Team: [name], PA-C as PCP - General ([place]) Amada Kingfisher, MD as Consulting Physician (Hematology and Oncology) Danie Binder, MD as Consulting Physician (Gastroenterology)  DIAGNOSIS: Breast cancer of upper-outer quadrant of right female breast   Staging form: Breast, AJCC 7th Edition     Clinical: Stage IA (T1c, N0, cM0) - Unsigned       Staging comments: Staged at breast conference 10.29.14      Pathologic: Stage IA (T1c, N0, cM0) - Signed by Rulon Eisenmenger, MD on 04/29/2014   SUMMARY OF ONCOLOGIC HISTORY:   Breast cancer of upper-outer quadrant of right female breast   04/29/2013 Mammogram Right breast mass, 7 mm lesion at 9:00 position, IDC with DCIS grade 2 ER  PR 99% positive HER-2 negative Ki-67 11%: Second mass IDC ER/PR positive HER-2 negative Ki-67 11%, MRI 6 cm including enhancement    07/12/2013 Surgery Right breast mastectomy: IDC grade 1, 2 foci 1.2 and 1.1 cm, LVI positive, 2 SLN negative, Oncotype DX score 23 intermediate risk, ROR. 14%   09/26/2013 - 11/28/2013 Chemotherapy Adjuvant Taxotere and Cytoxan x4 cycles   01/14/2014 Surgery Right breast reconstruction and right port removal    CHIEF COMPLIANT: Follow-up of right breast cancer  INTERVAL HISTORY: Morgan Santos is a 64 year old lady with above-mentioned right-sided breast cancer treated with mastectomy followed by adjuvant chemotherapy and breast reconstruction. She is currently an oral antiestrogen therapy with Arimidex. She is tolerating it fairly well. She has had hot flashes which have not changed significantly. She can currently has low back pain. It radiates down both her legs. She went to see a Restaurant manager, fast food. Still having pain issues.  REVIEW OF SYSTEMS:   Constitutional: Denies fevers, chills or abnormal weight loss Eyes: Denies blurriness of vision Ears, nose, mouth, throat, and face: Denies mucositis or sore throat Respiratory: Denies cough, dyspnea or wheezes Cardiovascular:  Denies palpitation, chest discomfort or lower extremity swelling Gastrointestinal:  Denies nausea, heartburn or change in bowel habits Skin: Denies abnormal skin rashes Lymphatics: Denies new lymphadenopathy or easy bruising Neurological:Denies numbness, tingling or new weaknesses Behavioral/Psych: Mood is stable, no new changes  Breast:  denies any pain or lumps or nodules in either breasts All other systems were reviewed with the patient and are negative.  I have reviewed the past medical history, past surgical history, social history and family history with the patient and they are unchanged from previous note.  ALLERGIES:  is allergic to codeine and multihance.  MEDICATIONS:  Current Outpatient Prescriptions  Medication Sig Dispense Refill  . ALPRAZolam (XANAX) 0.5 MG tablet Take 0.5 mg by mouth at bedtime as needed for anxiety.     Marland Kitchen anastrozole (ARIMIDEX) 1 MG tablet Take 1 tablet (1 mg total) by mouth daily. 30 tablet 6  . aspirin 81 MG chewable tablet Chew 81 mg by mouth.    . Calcium-Magnesium-Zinc 500-250-12.5 MG TABS Take by mouth 2 (two) times daily.    . Cholecalciferol (VITAMIN D-3) 5000 UNITS TABS Take 2 each by mouth daily.    . citalopram (CELEXA) 20 MG tablet Take 20 mg by mouth daily.    . Omega-3 Fatty Acids (FISH OIL PO) Take 1 capsule by mouth every morning.    Marland Kitchen omeprazole (PRILOSEC) 20 MG capsule Take 20 mg by mouth.    . polyethylene glycol powder (GLYCOLAX/MIRALAX) powder Take one capful twice a day for 3 days, then take once daily as needed for constipation. 527 g 3   No  current facility-administered medications for this visit.    PHYSICAL EXAMINATION: ECOG PERFORMANCE STATUS: 1 - Symptomatic but completely ambulatory  Filed Vitals:   12/09/14 1329  BP: 130/67  Pulse: 63  Temp: 97.7 F (36.5 C)  Resp: 18   Filed Weights   12/09/14 1329  Weight: 226 lb 3.2 oz (102.604 kg)    GENERAL:alert, no distress and comfortable SKIN: skin color, texture,  turgor are normal, no rashes or significant lesions EYES: normal, Conjunctiva are pink and non-injected, sclera clear OROPHARYNX:no exudate, no erythema and lips, buccal mucosa, and tongue normal  NECK: supple, thyroid normal size, non-tender, without nodularity LYMPH:  no palpable lymphadenopathy in the cervical, axillary or inguinal LUNGS: clear to auscultation and percussion with normal breathing effort HEART: regular rate & rhythm and no murmurs and no lower extremity edema ABDOMEN:abdomen soft, non-tender and normal bowel sounds Musculoskeletal:no cyanosis of digits and no clubbing  NEURO: alert & oriented x 3 with fluent speech, no focal motor/sensory deficits BREAST: No palpable masses or nodules No palpable axillary supraclavicular or infraclavicular adenopathy no breast tenderness or nipple discharge. (exam performed in the presence of a chaperone)  LABORATORY DATA:  I have reviewed the data as listed   Chemistry      Component Value Date/Time   NA 135 04/01/2014 1236   NA 142 02/18/2014 1104   K 3.9 04/01/2014 1236   K 3.9 02/18/2014 1104   CL 101 04/01/2014 1236   CO2 28 04/01/2014 1236   CO2 24 02/18/2014 1104   BUN 12 04/01/2014 1236   BUN 8.8 02/18/2014 1104   CREATININE 0.71 04/01/2014 1236   CREATININE 0.7 02/18/2014 1104      Component Value Date/Time   CALCIUM 9.1 04/01/2014 1236   CALCIUM 9.4 02/18/2014 1104   ALKPHOS 103 04/01/2014 1236   ALKPHOS 97 02/18/2014 1104   AST 19 04/01/2014 1236   AST 17 02/18/2014 1104   ALT 18 04/01/2014 1236   ALT 15 02/18/2014 1104   BILITOT 0.5 04/01/2014 1236   BILITOT 0.48 02/18/2014 1104       Lab Results  Component Value Date   WBC 5.3 12/09/2014   HGB 13.8 12/09/2014   HCT 41.6 12/09/2014   MCV 89.7 12/09/2014   PLT 189 12/09/2014   NEUTROABS 2.6 12/09/2014     RADIOGRAPHIC STUDIES: I have personally reviewed the radiology reports and agreed with their findings. 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 ER 99% PR 99% HER-2 negative: Intermediate risk Oncotype DX recurrence score 23. Patient received 4 cycles of Taxotere Cytoxan chemotherapy. She had breast reconstruction subsequently in May 2015, started Arimidex May 2015  Surveillance:  1. Breast exam 12/09/2014 did not reveal any lumps or nodules 2. Mammogram 05/27/2014 is normal 3. Bone density April 2015 showed T score -0.6  Arimidex toxicities: 1. Hot flashes but not severe 2. Fatigue  Survivorship:Discussed the importance of physical exercise in decreasing the likelihood of breast cancer recurrence. Recommended 30 mins daily 6 days a week of either brisk walking or cycling or swimming. Encouraged patient to eat more fruits and vegetables and decrease red meat.       Low back pain: Patient has seen a chiropractor. I discussed with her about evaluating her back with an MRI. She would like to watch it and treated  conservatively. We will refer her to physical therapy at [facility]. If her symptoms do not resolve, we might have to do further evaluation of her back. Return to clinic in 6 months for follow-up    No orders of the defined types were placed in this encounter.   The patient has a good understanding of the overall plan. she agrees with it. She will call with any problems that may develop before her next visit here.   Rulon Eisenmenger, MD

## 2015-02-06 ENCOUNTER — Encounter: Payer: Self-pay | Admitting: *Deleted

## 2015-02-12 ENCOUNTER — Ambulatory Visit (INDEPENDENT_AMBULATORY_CARE_PROVIDER_SITE_OTHER): Payer: 59 | Admitting: Orthopedic Surgery

## 2015-02-12 ENCOUNTER — Encounter: Payer: Self-pay | Admitting: Orthopedic Surgery

## 2015-02-12 ENCOUNTER — Ambulatory Visit (INDEPENDENT_AMBULATORY_CARE_PROVIDER_SITE_OTHER): Payer: 59

## 2015-02-12 VITALS — BP 142/83 | Ht 69.0 in | Wt 226.0 lb

## 2015-02-12 DIAGNOSIS — M48061 Spinal stenosis, lumbar region without neurogenic claudication: Secondary | ICD-10-CM

## 2015-02-12 DIAGNOSIS — M25562 Pain in left knee: Secondary | ICD-10-CM | POA: Diagnosis not present

## 2015-02-12 DIAGNOSIS — M1712 Unilateral primary osteoarthritis, left knee: Secondary | ICD-10-CM

## 2015-02-12 DIAGNOSIS — M4806 Spinal stenosis, lumbar region: Secondary | ICD-10-CM

## 2015-02-12 DIAGNOSIS — M129 Arthropathy, unspecified: Secondary | ICD-10-CM | POA: Diagnosis not present

## 2015-02-12 NOTE — Patient Instructions (Signed)
Continue meloxicam  Call [facility] therapy dept to schedule therapy

## 2015-02-12 NOTE — Progress Notes (Signed)
Patient ID: [name], female   DOB: [birth year], 64 y.o.   MRN: [id number]  Chief Complaint  Patient presents with  . Knee Pain    left knee pain, ref. A. JONES,PA    HPI Morgan Santos is a 64 y.o. female.  Presents for evaluation of left knee pain when she goes up and down the stairs with crepitance and grinding stiffness which is worse with activity but has been relieved with recent onset of taking meloxicam for the last couple of weeks. Pain present for about 6 months  Review of systems she does have some hip pain bilateral buttock pain which radiates occasionally down both legs and she has some intermittent back pain  She listed her review of systems is otherwise negative including no bowel or bladder problems and no night sweats or weight loss     Past Medical History  Diagnosis Date  . Breast cancer   . Anxiety   . GERD (gastroesophageal reflux disease)     pt. uses vinegar for heartburn, ususally once per week   . Hypertension     "when I get upset"  Not on medication  . Stroke ?2010 or 2011    2010, [facility] for stroke "mini stroke" short term memory problems since  . Anemia     when she was younger  . Constipation   . Complication of anesthesia   . PONV (postoperative nausea and vomiting)     "just for one day"  . Wears glasses   . Wears dentures     top  . H/O degenerative disc disease   . Arthritis     hip- R, back; osteoarthritis  . Hyperlipidemia   . Depression     pt. remarks that she "takes respridal because if I don't take it I get angry"     Past Surgical History  Procedure Laterality Date  . Cholecystectomy    . Mastectomy w/ sentinel node biopsy Right 07/12/2013    Procedure: MASTECTOMY WITH SENTINEL LYMPH NODE BIOPSY;  Surgeon: Rolm Bookbinder, MD;  Location: [place];  Service: General;  Laterality: Right;  . Breast reconstruction with placement of tissue expander and flex hd (acellular hydrated dermis) Right 07/12/2013    Procedure:  RIGHT BREAST RECONSTRUCTION WITH PLACEMENT OF TISSUE EXPANDER AND FLEX HD TO RIGHT BREAST (ACELLULAR HYDRATED DERMIS);  Surgeon: Irene Limbo, MD;  Location: [place];  Service: Plastics;  Laterality: Right;  . Vaginal hysterectomy    . Colonoscopy   01/29/2004    NUR:A 7-8 mm polyp snared from the hepatic flexure.  Another 3 mm polyp was     cold snared from the sigmoid colon.External hemorrhoids, possible source of recent rectal bleeding  . Mastectomy    . Tubal ligation    . Dilation and curettage of uterus    . Portacath placement N/A 09/16/2013    Procedure: INSERTION PORT-A-CATH;  Surgeon: Rolm Bookbinder, MD;  Location: [place];  Service: General;  Laterality: N/A;  . Removal of bilateral tissue expanders with placement of bilateral breast implants Right 01/14/2014    Procedure: REMOVAL OF RIGHT TISSUE EXPANDERS WITH PLACEMENT OF PERMANENT BREAST IMPLANT;  Surgeon: Irene Limbo, MD;  Location: [place];  Service: Plastics;  Laterality: Right;  . Mastopexy Left 01/14/2014    Procedure: LEFT BREAST MASTOPEXY FOR SYMMETRY ;  Surgeon: Irene Limbo, MD;  Location: [place];  Service: Plastics;  Laterality: Left;  . Port-a-cath removal Left 01/14/2014  Procedure: REMOVAL PORT-A-CATH;  Surgeon: Irene Limbo, MD;  Location: [place];  Service: Plastics;  Laterality: Left;  . Colonoscopy N/A 06/05/2014    Procedure: COLONOSCOPY;  Surgeon: Danie Binder, MD;  [facility];  Service: Endoscopy;  Laterality: N/A;  1030 - moved to 10:45 - Ginger to notify pt   . Lumbar spine surgery    . Breast surgery      for abcesses- removed from both breasts, many yrs. ago     Allergies  Allergen Reactions  . Codeine Nausea And Vomiting  . Crestor [Rosuvastatin Calcium]   . Lipitor [Atorvastatin]   . Multihance [Gadobenate] Nausea Only and Cough    PT HAD INCREASED MUCOUS PRODUCTION AND PHLEGMY COUGH LASTING 10 MINS AFTER  INJECTION, PT SENT HOME WITH BENADRYL, PREV NAUSEA AFTER GAD    Current Outpatient Prescriptions  Medication Sig Dispense Refill  . ALPRAZolam (XANAX) 0.5 MG tablet Take 0.5 mg by mouth 2 (two) times daily as needed for anxiety.     Marland Kitchen anastrozole (ARIMIDEX) 1 MG tablet Take 1 tablet (1 mg total) by mouth daily. 30 tablet 6  . Calcium-Magnesium-Zinc 500-250-12.5 MG TABS Take by mouth 2 (two) times daily.    . Cholecalciferol (VITAMIN D-3) 5000 UNITS TABS Take 2 each by mouth daily.    . citalopram (CELEXA) 20 MG tablet Take 20 mg by mouth daily.    Marland Kitchen gabapentin (NEURONTIN) 100 MG capsule Take 100 mg by mouth 3 (three) times daily.    . meloxicam (MOBIC) 7.5 MG tablet Take 7.5 mg by mouth daily.    . Omega-3 Fatty Acids (FISH OIL PO) Take 1 capsule by mouth 2 (two) times daily.     Marland Kitchen omeprazole (PRILOSEC) 20 MG capsule Take 20 mg by mouth daily.     . simvastatin (ZOCOR) 20 MG tablet Take 20 mg by mouth daily.     No current facility-administered medications for this visit.    Review of Systems Review of Systems    Physical Exam Blood pressure 142/83, height 5\' 9"  (1.753 m), weight 226 lb (102.513 kg).  Physical Exam BP 142/83 mmHg  Ht 5\' 9"  (1.753 m)  Wt 226 lb (102.513 kg)  BMI 33.36 kg/m2 Gen. appearance well-groomed normal hygiene. Oriented 3. Mood affect normal pleasant. Ambulatory status normal. Left knee medial joint line tenderness and patellofemoral crepitance Knee flexion ARC 125 Stability tests normal Muscle tone normal extension strength normal Skin normal Sensation left foot pain we'll test normal soft touch normal position sense normal Pulses 2+ in the left foot Lumbar spine tenderness lower segments   Data Reviewed We x-rayed and ordered x-rays here I interpreted those x-rays as moderately severe arthritis with involvement of the medial compartment  Assessment    Encounter Diagnoses  Name Primary?  Marland Kitchen Arthritis of knee, left Yes  . Spinal stenosis  of lumbar region         Plan    Recommend continue meloxicam follow-up in 6 months for x-rays of the left knee  Physical therapy for spinal stenosis       Arther Abbott 02/12/2015, 10:35 AM

## 2015-02-17 ENCOUNTER — Ambulatory Visit (INDEPENDENT_AMBULATORY_CARE_PROVIDER_SITE_OTHER): Payer: Commercial Indemnity | Admitting: Obstetrics & Gynecology

## 2015-02-17 VITALS — BP 110/80 | HR 76 | Ht 69.2 in | Wt 229.0 lb

## 2015-02-17 DIAGNOSIS — Z01419 Encounter for gynecological examination (general) (routine) without abnormal findings: Secondary | ICD-10-CM | POA: Diagnosis not present

## 2015-02-17 DIAGNOSIS — Z1212 Encounter for screening for malignant neoplasm of rectum: Secondary | ICD-10-CM | POA: Diagnosis not present

## 2015-02-17 DIAGNOSIS — Z1211 Encounter for screening for malignant neoplasm of colon: Secondary | ICD-10-CM

## 2015-02-17 NOTE — Progress Notes (Signed)
Patient ID: Morgan Santos, female   DOB: 1951/02/26, 64 y.o.   MRN: [id number] Subjective:     Morgan Santos is a 64 y.o. female here for a routine exam.  No LMP recorded. Patient has had a hysterectomy. No obstetric history on file. Birth Control Method:   Menstrual Calendar(currently):   Current complaints: none.   Current acute medical issues:  hypertension   Recent Gynecologic History No LMP recorded. Patient has had a hysterectomy. Last Pap: ,   Last mammogram: 05/2014,  normal  Past Medical History  Diagnosis Date  . Breast cancer   . Anxiety   . GERD (gastroesophageal reflux disease)     pt. uses vinegar for heartburn, ususally once per week   . Hypertension     "when I get upset"  Not on medication  . Stroke ?2010 or 2011    2010, [facility] for stroke "mini stroke" short term memory problems since  . Anemia     when she was younger  . Constipation   . Complication of anesthesia   . PONV (postoperative nausea and vomiting)     "just for one day"  . Wears glasses   . Wears dentures     top  . H/O degenerative disc disease   . Arthritis     hip- R, back; osteoarthritis  . Hyperlipidemia   . Depression     pt. remarks that she "takes respridal because if I don't take it I get angry"     Past Surgical History  Procedure Laterality Date  . Cholecystectomy    . Mastectomy w/ sentinel node biopsy Right 07/12/2013    Procedure: MASTECTOMY WITH SENTINEL LYMPH NODE BIOPSY;  Surgeon: Rolm Bookbinder, MD;  Location: [place];  Service: General;  Laterality: Right;  . Breast reconstruction with placement of tissue expander and flex hd (acellular hydrated dermis) Right 07/12/2013    Procedure: RIGHT BREAST RECONSTRUCTION WITH PLACEMENT OF TISSUE EXPANDER AND FLEX HD TO RIGHT BREAST (ACELLULAR HYDRATED DERMIS);  Surgeon: Irene Limbo, MD;  Location: [place];  Service: Plastics;  Laterality: Right;  . Vaginal hysterectomy    . Colonoscopy   01/29/2004    NUR:A 7-8 mm  polyp snared from the hepatic flexure.  Another 3 mm polyp was     cold snared from the sigmoid colon.External hemorrhoids, possible source of recent rectal bleeding  . Mastectomy    . Tubal ligation    . Dilation and curettage of uterus    . Portacath placement N/A 09/16/2013    Procedure: INSERTION PORT-A-CATH;  Surgeon: Rolm Bookbinder, MD;  Location: [place];  Service: General;  Laterality: N/A;  . Removal of bilateral tissue expanders with placement of bilateral breast implants Right 01/14/2014    Procedure: REMOVAL OF RIGHT TISSUE EXPANDERS WITH PLACEMENT OF PERMANENT BREAST IMPLANT;  Surgeon: Irene Limbo, MD;  Location: [place];  Service: Plastics;  Laterality: Right;  . Mastopexy Left 01/14/2014    Procedure: LEFT BREAST MASTOPEXY FOR SYMMETRY ;  Surgeon: Irene Limbo, MD;  Location: [place];  Service: Plastics;  Laterality: Left;  . Port-a-cath removal Left 01/14/2014    Procedure: REMOVAL PORT-A-CATH;  Surgeon: Irene Limbo, MD;  Location: [place];  Service: Plastics;  Laterality: Left;  . Colonoscopy N/A 06/05/2014    Procedure: COLONOSCOPY;  Surgeon: Danie Binder, MD;  [facility];  Service: Endoscopy;  Laterality: N/A;  1030 - moved to 10:45 - Ginger to notify  pt   . Lumbar spine surgery    . Breast surgery      for abcesses- removed from both breasts, many yrs. ago    OB History    No data available      History   Social History  . Marital Status: Married    Spouse Name: N/A  . Number of Children: 4  . Years of Education: N/A   Occupational History  . retired 2014    Social History Main Topics  . Smoking status: Former Smoker    Quit date: 09/14/1995  . Smokeless tobacco: Never Used  . Alcohol Use: No  . Drug Use: Yes    Special: Marijuana     Comment: in the past  . Sexual Activity: Yes   Other Topics Concern  . Not on file   Social History Narrative    Family History   Problem Relation Age of Onset  . Hypertension Mother   . Diabetes type II Mother   . CVA Mother   . Heart disease Mother   . Osteoarthritis Mother   . Depression Mother   . Other Mother     degenerative disc disease  . Alcoholism Father   . Pneumonia Father   . Colon cancer Maternal Aunt     age 39s  . Colon cancer Other     maternal great aunt     Current outpatient prescriptions:  .  ALPRAZolam (XANAX) 0.5 MG tablet, Take 0.5 mg by mouth 2 (two) times daily as needed for anxiety. , Disp: , Rfl:  .  anastrozole (ARIMIDEX) 1 MG tablet, Take 1 tablet (1 mg total) by mouth daily., Disp: 30 tablet, Rfl: 6 .  Calcium-Magnesium-Zinc 500-250-12.5 MG TABS, Take by mouth 2 (two) times daily., Disp: , Rfl:  .  Cholecalciferol (VITAMIN D-3) 5000 UNITS TABS, Take 2 each by mouth daily., Disp: , Rfl:  .  citalopram (CELEXA) 20 MG tablet, Take 20 mg by mouth daily., Disp: , Rfl:  .  gabapentin (NEURONTIN) 100 MG capsule, Take 100 mg by mouth 3 (three) times daily., Disp: , Rfl:  .  meloxicam (MOBIC) 7.5 MG tablet, Take 7.5 mg by mouth daily., Disp: , Rfl:  .  Omega-3 Fatty Acids (FISH OIL PO), Take 1 capsule by mouth 2 (two) times daily. , Disp: , Rfl:  .  omeprazole (PRILOSEC) 20 MG capsule, Take 20 mg by mouth daily. , Disp: , Rfl:  .  simvastatin (ZOCOR) 20 MG tablet, Take 20 mg by mouth daily., Disp: , Rfl:   Review of Systems  Review of Systems  Constitutional: Negative for fever, chills, weight loss, malaise/fatigue and diaphoresis.  HENT: Negative for hearing loss, ear pain, nosebleeds, congestion, sore throat, neck pain, tinnitus and ear discharge.   Eyes: Negative for blurred vision, double vision, photophobia, pain, discharge and redness.  Respiratory: Negative for cough, hemoptysis, sputum production, shortness of breath, wheezing and stridor.   Cardiovascular: Negative for chest pain, palpitations, orthopnea, claudication, leg swelling and PND.  Gastrointestinal: negative for  abdominal pain. Negative for heartburn, nausea, vomiting, diarrhea, constipation, blood in stool and melena.  Genitourinary: Negative for dysuria, urgency, frequency, hematuria and flank pain.  Musculoskeletal: Negative for myalgias, back pain, joint pain and falls.  Skin: Negative for itching and rash.  Neurological: Negative for dizziness, tingling, tremors, sensory change, speech change, focal weakness, seizures, loss of consciousness, weakness and headaches.  Endo/Heme/Allergies: Negative for environmental allergies and polydipsia. Does not bruise/bleed easily.  Psychiatric/Behavioral: Negative for  depression, suicidal ideas, hallucinations, memory loss and substance abuse. The patient is not nervous/anxious and does not have insomnia.        Objective:  Blood pressure 110/80, pulse 76, height 5' 9.2" (1.758 m), weight 229 lb (103.874 kg).   Physical Exam  Vitals reviewed. Constitutional: She is oriented to person, place, and time. She appears well-developed and well-nourished.  HENT:  Head: Normocephalic and atraumatic.        Right Ear: External ear normal.  Left Ear: External ear normal.  Nose: Nose normal.  Mouth/Throat: Oropharynx is clear and moist.  Eyes: Conjunctivae and EOM are normal. Pupils are equal, round, and reactive to light. Right eye exhibits no discharge. Left eye exhibits no discharge. No scleral icterus.  Neck: Normal range of motion. Neck supple. No tracheal deviation present. No thyromegaly present.  Cardiovascular: Normal rate, regular rhythm, normal heart sounds and intact distal pulses.  Exam reveals no gallop and no friction rub.   No murmur heard. Respiratory: Effort normal and breath sounds normal. No respiratory distress. She has no wheezes. She has no rales. She exhibits no tenderness.  GI: Soft. Bowel sounds are normal. She exhibits no distension and no mass. There is no tenderness. There is no rebound and no guarding.  Genitourinary:  Breasts no masses  skin changes or nipple changes bilaterally      Vulva is normal without lesions Vagina is pink moist without discharge Cervix absent Uterus is absent Adnexa is negative no masses {Rectal    hemoccult negative, normal tone, no masses  Musculoskeletal: Normal range of motion. She exhibits no edema and no tenderness.  Neurological: She is alert and oriented to person, place, and time. She has normal reflexes. She displays normal reflexes. No cranial nerve deficit. She exhibits normal muscle tone. Coordination normal.  Skin: Skin is warm and dry. No rash noted. No erythema. No pallor.  Psychiatric: She has a normal mood and affect. Her behavior is normal. Judgment and thought content normal.       Assessment:    Healthy female exam.    Plan:    Mammogram ordered. Follow up in: 2 years.

## 2015-02-19 IMAGING — MG MM SCREEN MAMMOGRAM BILATERAL
7 series · 7 of 7 positions shown · non-contrast
Comparison: Previous exam(s).

CLINICAL DATA: Screening.

DIGITAL SCREENING BILATERAL MAMMOGRAM WITH CAD

[R CC (1 of 3)]
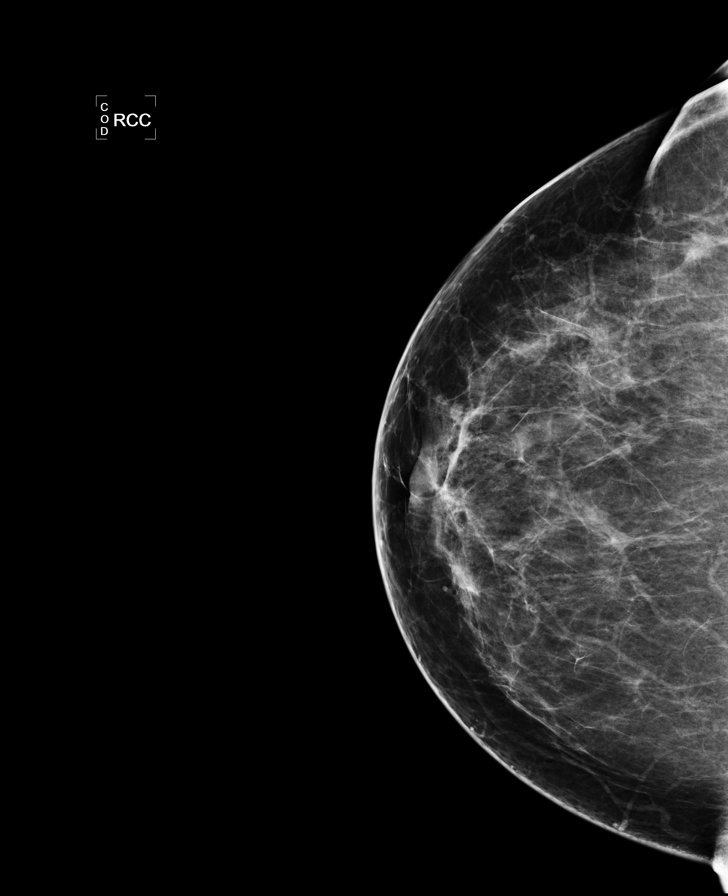

[L CC (1 of 2)]
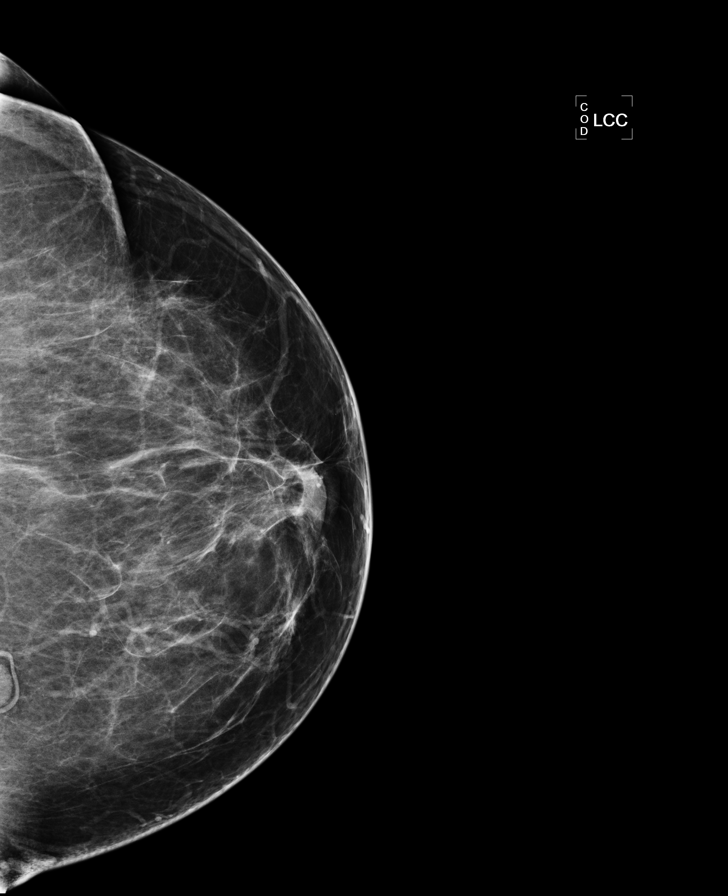

[L MLO]
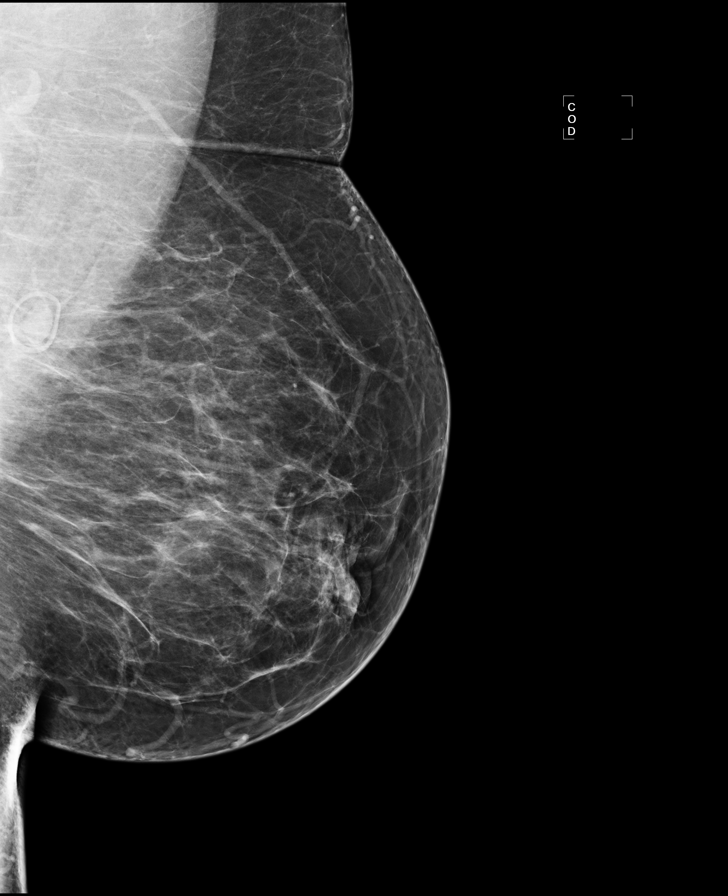

[R MLO]
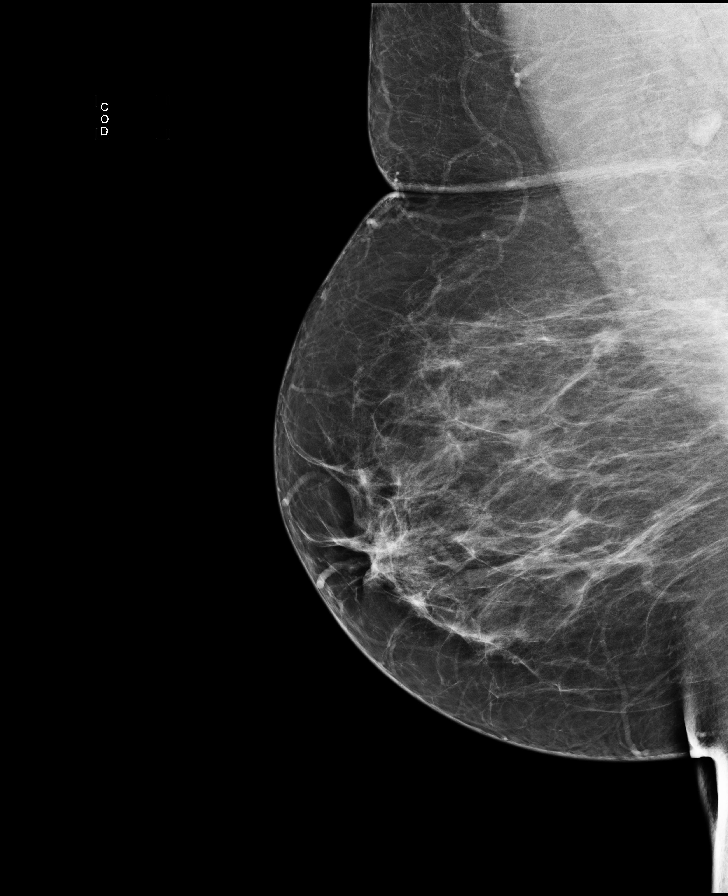

[R CC (2 of 3)]
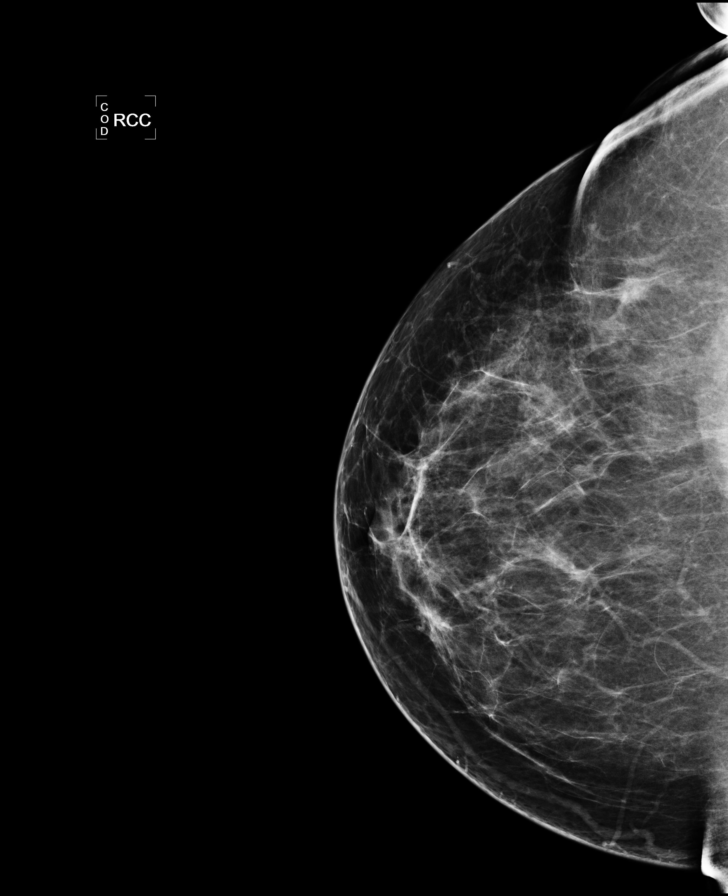

[L CC (2 of 2)]
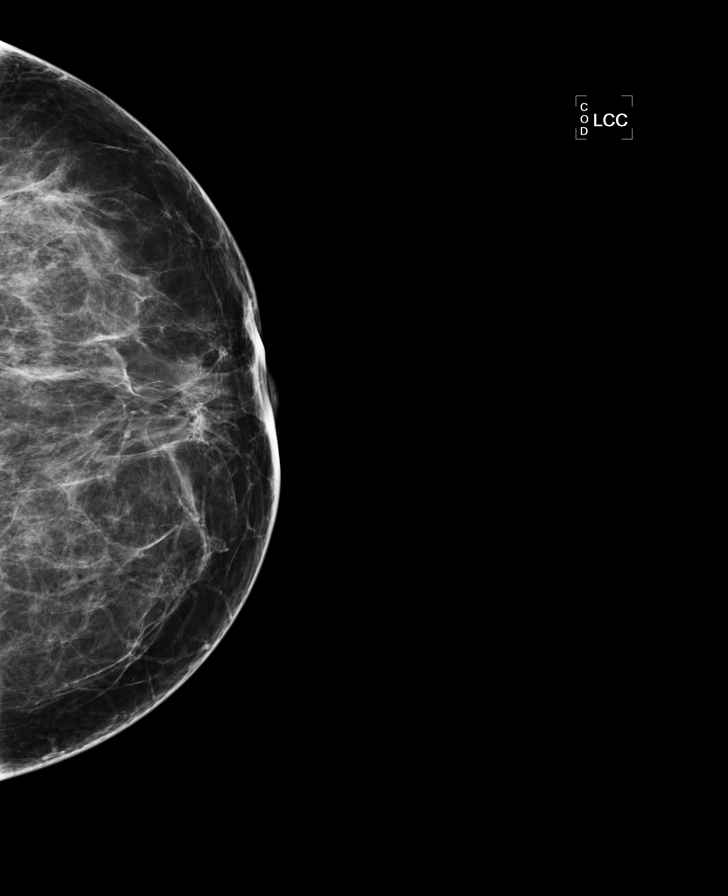

[R CC (3 of 3)]
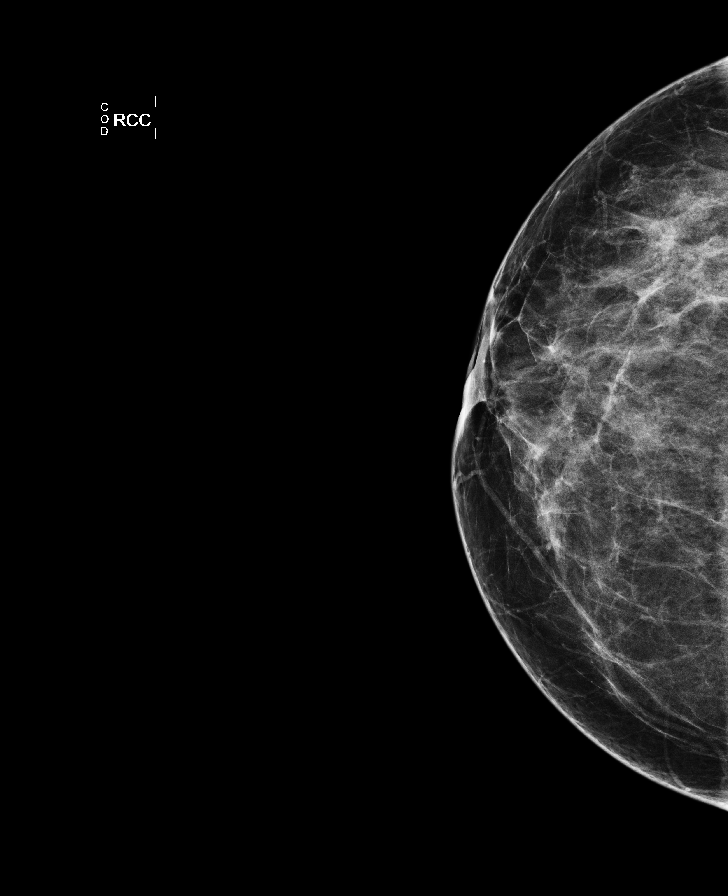

[7 of 7 positions shown; findings below may reference images not displayed]

FINDINGS: ACR Breast Density Category b:  There are scattered areas of
fibroglandular density.

In the right breast, a possible mass warrants further evaluation
with spot compression views and possibly ultrasound.  In the left
breast, there are no findings suspicious for malignancy.

Images were processed with CAD.
IMPRESSION: Further evaluation is suggested for possible mass in the right
breast.

RECOMMENDATION:
Diagnostic mammogram and possibly ultrasound of the right breast.
(Code:2D-T-334)

The patient will be contacted regarding the findings, and
additional imaging will be scheduled.

BI-RADS CATEGORY 0:  Incomplete.  Need additional imaging
evaluation and/or prior mammograms for comparison.

## 2015-02-25 ENCOUNTER — Ambulatory Visit (HOSPITAL_COMMUNITY): Payer: 59 | Attending: Orthopedic Surgery | Admitting: Physical Therapy

## 2015-02-25 DIAGNOSIS — R6889 Other general symptoms and signs: Secondary | ICD-10-CM

## 2015-02-25 DIAGNOSIS — M25562 Pain in left knee: Secondary | ICD-10-CM | POA: Diagnosis present

## 2015-02-25 DIAGNOSIS — R262 Difficulty in walking, not elsewhere classified: Secondary | ICD-10-CM | POA: Diagnosis present

## 2015-02-25 DIAGNOSIS — M545 Low back pain: Secondary | ICD-10-CM | POA: Diagnosis present

## 2015-02-25 NOTE — Therapy (Signed)
[place], [place], [address] Phone: [phone]   Fax:  [phone]  Physical Therapy Evaluation  Patient Details  Name: Morgan Santos MRN: [id number] Date of Birth: [birth year] Referring Provider:  Carole Civil, MD  Encounter Date: 02/25/2015      PT End of Session - 02/25/15 1206    Visit Number 1   Number of Visits 8   Date for PT Re-Evaluation 03/28/15   Authorization Type [employer]   Authorization - Visit Number 1   Authorization - Number of Visits 10   PT Start Time 1100   PT Stop Time 1147   PT Time Calculation (min) 47 min   Activity Tolerance Patient tolerated treatment well   Behavior During Therapy [facility] for tasks assessed/performed      Past Medical History  Diagnosis Date  . Breast cancer   . Anxiety   . GERD (gastroesophageal reflux disease)     pt. uses vinegar for heartburn, ususally once per week   . Hypertension     "when I get upset"  Not on medication  . Stroke ?2010 or 2011    2010, [facility] for stroke "mini stroke" short term memory problems since  . Anemia     when she was younger  . Constipation   . Complication of anesthesia   . PONV (postoperative nausea and vomiting)     "just for one day"  . Wears glasses   . Wears dentures     top  . H/O degenerative disc disease   . Arthritis     hip- R, back; osteoarthritis  . Hyperlipidemia   . Depression     pt. remarks that she "takes respridal because if I don't take it I get angry"     Past Surgical History  Procedure Laterality Date  . Cholecystectomy    . Mastectomy w/ sentinel node biopsy Right 07/12/2013    Procedure: MASTECTOMY WITH SENTINEL LYMPH NODE BIOPSY;  Surgeon: Rolm Bookbinder, MD;  Location: [place];  Service: General;  Laterality: Right;  . Breast reconstruction with placement of tissue expander and flex hd (acellular hydrated dermis) Right 07/12/2013    Procedure: RIGHT BREAST RECONSTRUCTION WITH  PLACEMENT OF TISSUE EXPANDER AND FLEX HD TO RIGHT BREAST (ACELLULAR HYDRATED DERMIS);  Surgeon: Irene Limbo, MD;  Location: [place];  Service: Plastics;  Laterality: Right;  . Vaginal hysterectomy    . Colonoscopy   01/29/2004    NUR:A 7-8 mm polyp snared from the hepatic flexure.  Another 3 mm polyp was     cold snared from the sigmoid colon.External hemorrhoids, possible source of recent rectal bleeding  . Mastectomy    . Tubal ligation    . Dilation and curettage of uterus    . Portacath placement N/A 09/16/2013    Procedure: INSERTION PORT-A-CATH;  Surgeon: Rolm Bookbinder, MD;  Location: [place];  Service: General;  Laterality: N/A;  . Removal of bilateral tissue expanders with placement of bilateral breast implants Right 01/14/2014    Procedure: REMOVAL OF RIGHT TISSUE EXPANDERS WITH PLACEMENT OF PERMANENT BREAST IMPLANT;  Surgeon: Irene Limbo, MD;  Location: [place];  Service: Plastics;  Laterality: Right;  . Mastopexy Left 01/14/2014    Procedure: LEFT BREAST MASTOPEXY FOR SYMMETRY ;  Surgeon: Irene Limbo, MD;  Location: [place];  Service: Plastics;  Laterality: Left;  . Port-a-cath removal Left 01/14/2014    Procedure: REMOVAL PORT-A-CATH;  Surgeon: Arnoldo Hooker  Iran Planas, MD;  Location: [place];  Service: Plastics;  Laterality: Left;  . Colonoscopy N/A 06/05/2014    Procedure: COLONOSCOPY;  Surgeon: Danie Binder, MD;  [facility];  Service: Endoscopy;  Laterality: N/A;  1030 - moved to 10:45 - Ginger to notify pt   . Lumbar spine surgery    . Breast surgery      for abcesses- removed from both breasts, many yrs. ago    There were no vitals filed for this visit.  Visit Diagnosis:  Knee pain, left  Bilateral low back pain, with sciatica presence unspecified  Difficulty walking  Decreased functional activity tolerance      Subjective Assessment - 02/25/15 1101    Subjective Pt reports that she has been  having L knee pain for about 6 months, which began when she turned funny on her knee. She also c/o pain in low back and bilateral hips that has been present for years, with a worsening of pain in her buttocks for the past few months. Lately, she has been having increased pain and difficulty while walking. She has difficulty ascending/descending stairs.    How long can you sit comfortably? no limitations   How long can you stand comfortably? 5 minutes   How long can you walk comfortably? >5 minutes   Patient Stated Goals be able to walk without pain, decrease pain and numbness, be able to grocery shop without pain   Currently in Pain? Yes   Pain Score 5    Pain Location Knee   Pain Orientation Left   Pain Descriptors / Indicators Aching;Dull;Throbbing   Pain Type Chronic pain   Multiple Pain Sites Yes   Pain Score 5   Pain Location Back   Pain Orientation Lower;Right;Left   Pain Descriptors / Indicators Aching            [facility] PT Assessment - 02/25/15 0001    Assessment   Medical Diagnosis LBP, L knee pain   Next MD Visit Aline Brochure- 08/13/15   Prior Therapy has had PT for back twice before   Precautions   Precautions None   Restrictions   Weight Bearing Restrictions No   Balance Screen   Has the patient fallen in the past 6 months No   Has the patient had a decrease in activity level because of a fear of falling?  Yes   Is the patient reluctant to leave their home because of a fear of falling?  No   Home Ecologist residence   Living Arrangements Spouse/significant other   Available Help at Discharge Family   Type of [place] One level   Prior Function   Level of [place] Retired   Manufacturing engineer for [employer]   Observation/Other Assessments   Focus on Therapeutic Outcomes (FOTO)  38   Functional Tests   Functional tests Sit to Stand   Sit to Stand   Comments 5 time sit to stand  24.06 seconds   Posture/Postural Control   Posture/Postural Control Postural limitations   Postural Limitations Increased lumbar lordosis   Posture Comments RLE longer than LLE by 3 cm   ROM / Strength   AROM / PROM / Strength AROM;PROM;Strength   AROM   AROM Assessment Site Lumbar   Lumbar Flexion 79   Lumbar Extension 10   Lumbar - Right Side Bend 20   Lumbar - Left Side Bend  25   PROM   PROM Assessment Site Hip;Knee   Right/Left Hip Right;Left   Right Hip External Rotation  37   Right Hip Internal Rotation  45   Left Hip External Rotation  38   Left Hip Internal Rotation  34   Right/Left Knee Right;Left   Left Knee Extension 0   Left Knee Flexion 120   Strength   Strength Assessment Site Hip;Knee;Ankle   Right/Left Hip Right;Left   Right Hip Flexion 3-/5   Right Hip Extension 3-/5   Right Hip ABduction 3-/5   Left Hip Flexion 3-/5   Left Hip Extension 3-/5   Left Hip ABduction 3-/5   Right/Left Knee Right;Left   Right Knee Flexion 4/5   Right Knee Extension 4/5   Left Knee Flexion 4/5   Left Knee Extension 4/5   Right/Left Ankle Right;Left   Right Ankle Dorsiflexion 5/5   Left Ankle Dorsiflexion 5/5   Flexibility   Soft Tissue Assessment /Muscle Length yes   Hamstrings decreased length bilaterally   Piriformis decreased length bilaterally   Palpation   Palpation comment Bilateral piriformis tender to palpation   Special Tests    Special Tests Lumbar;Hip Special Tests   Lumbar Tests FABER test;Straight Leg Raise   Hip Special Tests  Saralyn Pilar (FABER) Test;Hip Scouring   FABER test   findings Negative   Saralyn Pilar (FABER) Test   Findings Negative   Hip Scouring   Findings Negative                   [facility] Adult PT Treatment/Exercise - 02/25/15 0001    Exercises   Exercises Lumbar;Knee/Hip   Lumbar Exercises: Stretches   Active Hamstring Stretch 3 reps;30 seconds   Single Knee to Chest Stretch 10 seconds;5 reps   Piriformis Stretch 30 seconds;3  reps   Piriformis Stretch Limitations seated                PT Education - 02/25/15 1205    Education provided Yes   Education Details Pt educated on HEP, POC, educated on getting 1/2 inch heel lift to begin to correct LLD   Person(s) Educated Patient   Methods Explanation;Handout   Comprehension Verbalized understanding;Returned demonstration          PT Short Term Goals - 02/25/15 1220    PT SHORT TERM GOAL #1   Title Pt will be independent with HEP.   Time 2   Period Weeks   Status New   PT SHORT TERM GOAL #2   Title Pt will demonstrate increased BLE strength by 5 time sit to stand time of 21 seconds.    Time 2   Period Weeks   Status New   PT SHORT TERM GOAL #3   Title Pt will ascend/descend 6 steps without increased pain in knee or low back.    Time 2   Period Weeks   Status New   PT SHORT TERM GOAL #4   Title Pt will report <3/10 pain when getting out of a car or when standing for extended period of time.    Time 2   Period Weeks   Status New           PT Long Term Goals - 02/25/15 1222    PT LONG TERM GOAL #1   Title Pt will be independent in advanced HEP.    Time 4   Period Weeks   Status New   PT LONG TERM GOAL #2  Title Pt will demonstrate increased BLE strength by 5 time sit to stand time of 17 seconds.    Time 4   Period Weeks   Status New   PT LONG TERM GOAL #3   Title Pt will ascend/descend 12 stairs without increased low back or knee pain.    Time 4   Period Weeks   Status New   PT LONG TERM GOAL #4   Title Pt will report <1/10 pain when getting in and out of car and when standing for extended periods of time.    Time 4   Period Weeks   Status New   PT LONG TERM GOAL #5   Title Pt will tolerate wearing 1 inch heel lift in L shoe for a full day without pain to correct LLD and improve gait mechanics.                Plan - 02/25/15 1217    Clinical Impression Statement Pt presents to PT with decreased strength of BLE,  impaired ROM of lumbar spine and hips, decreased functional activity tolerance, and pain in her low back and knee that is affecting her functional mobility. She will benefit from skilled physical therapy to address these impairments to allow her to return to PLOF without pain, decrease caregiver burden, and improve quality of life.    Pt will benefit from skilled therapeutic intervention in order to improve on the following deficits Abnormal gait;Decreased activity tolerance;Decreased endurance;Decreased mobility;Decreased range of motion;Decreased strength;Difficulty walking;Impaired flexibility;Postural dysfunction;Pain   Rehab Potential Good   PT Frequency 2x / week   PT Duration 4 weeks   PT Treatment/Interventions Moist Heat;Traction;Gait training;Stair training;Therapeutic activities;Therapeutic exercise;Neuromuscular re-education;Patient/family education;Manual techniques   PT Next Visit Plan Review eval/goals, begin core and BLE strengthening.    Consulted and Agree with Plan of Care Patient         Problem List Patient Active Problem List   Diagnosis Date Noted  . Unspecified constipation 05/21/2014  . FH: colon cancer 05/21/2014  . Breast cancer of upper-outer quadrant of right female breast 06/19/2013    Hilma Favors, PT, DPT [phone] 02/25/2015, 12:27 PM  [place], [place], [address] Phone: [phone]   Fax:  [phone]

## 2015-02-25 NOTE — Patient Instructions (Signed)
Knee-to-Chest Stretch: Unilateral   With hand behind right knee, pull knee in to chest until a comfortable stretch is felt in lower back and buttocks. Keep back relaxed. Hold __10__ seconds. Repeat __5__ times per set. Do __1-2__ sets per session. Do _1-2___ sessions per day.  http://orth.exer.us/127   Copyright  VHI. All rights reserved.  Hamstring Stretch   With other leg bent, foot flat, grasp right leg and slowly try to straighten knee. Hold __30__ seconds. Repeat _3___ times. Do __1-2__ sessions per day.  http://gt2.exer.us/280   Copyright  VHI. All rights reserved.  Piriformis Stretch, Sitting   Sit, one ankle on opposite knee, same-side hand on crossed knee. Push down on knee, keeping spine straight. Lean torso forward, with flat back, until tension is felt in hamstrings and gluteals of crossed-leg side. Hold _30__ seconds.  Repeat _3__ times per session. Do __1-2_ sessions per day.  Copyright  VHI. All rights reserved.

## 2015-02-28 ENCOUNTER — Other Ambulatory Visit: Payer: Self-pay | Admitting: Hematology and Oncology

## 2015-03-02 NOTE — Telephone Encounter (Signed)
Last OV 12/09/14.  Next OV 06/11/15.  Chart reviewed.

## 2015-03-03 ENCOUNTER — Ambulatory Visit (HOSPITAL_COMMUNITY): Payer: 59

## 2015-03-03 DIAGNOSIS — R262 Difficulty in walking, not elsewhere classified: Secondary | ICD-10-CM

## 2015-03-03 DIAGNOSIS — M25562 Pain in left knee: Secondary | ICD-10-CM | POA: Diagnosis not present

## 2015-03-03 DIAGNOSIS — R6889 Other general symptoms and signs: Secondary | ICD-10-CM

## 2015-03-03 DIAGNOSIS — M545 Low back pain: Secondary | ICD-10-CM

## 2015-03-03 NOTE — Therapy (Signed)
[name], [place], [address] Phone: [phone]   Fax:  [phone]  Physical Therapy Treatment  Patient Details  Name: Morgan Santos MRN: [id number] Date of Birth: 06-13-1951 Referring Provider:  Carole Civil, MD  Encounter Date: 03/03/2015      PT End of Session - 03/03/15 0945    Visit Number 2   Number of Visits 8   Date for PT Re-Evaluation 03/28/15   Authorization Type [employer]   Authorization - Visit Number 2   Authorization - Number of Visits 10   PT Start Time 0933   PT Stop Time 1018   PT Time Calculation (min) 45 min   Activity Tolerance Patient tolerated treatment well;Patient limited by pain  Knee pain with squats   Behavior During Therapy [facility] for tasks assessed/performed      Past Medical History  Diagnosis Date  . Breast cancer   . Anxiety   . GERD (gastroesophageal reflux disease)     pt. uses vinegar for heartburn, ususally once per week   . Hypertension     "when I get upset"  Not on medication  . Stroke ?2010 or 2011    2010, [facility] for stroke "mini stroke" short term memory problems since  . Anemia     when she was younger  . Constipation   . Complication of anesthesia   . PONV (postoperative nausea and vomiting)     "just for one day"  . Wears glasses   . Wears dentures     top  . H/O degenerative disc disease   . Arthritis     hip- R, back; osteoarthritis  . Hyperlipidemia   . Depression     pt. remarks that she "takes respridal because if I don't take it I get angry"     Past Surgical History  Procedure Laterality Date  . Cholecystectomy    . Mastectomy w/ sentinel node biopsy Right 07/12/2013    Procedure: MASTECTOMY WITH SENTINEL LYMPH NODE BIOPSY;  Surgeon: Rolm Bookbinder, MD;  Location: [place];  Service: General;  Laterality: Right;  . Breast reconstruction with placement of tissue expander and flex hd (acellular hydrated dermis) Right 07/12/2013     Procedure: RIGHT BREAST RECONSTRUCTION WITH PLACEMENT OF TISSUE EXPANDER AND FLEX HD TO RIGHT BREAST (ACELLULAR HYDRATED DERMIS);  Surgeon: Irene Limbo, MD;  Location: [place];  Service: Plastics;  Laterality: Right;  . Vaginal hysterectomy    . Colonoscopy   01/29/2004    NUR:A 7-8 mm polyp snared from the hepatic flexure.  Another 3 mm polyp was     cold snared from the sigmoid colon.External hemorrhoids, possible source of recent rectal bleeding  . Mastectomy    . Tubal ligation    . Dilation and curettage of uterus    . Portacath placement N/A 09/16/2013    Procedure: INSERTION PORT-A-CATH;  Surgeon: Rolm Bookbinder, MD;  Location: [place];  Service: General;  Laterality: N/A;  . Removal of bilateral tissue expanders with placement of bilateral breast implants Right 01/14/2014    Procedure: REMOVAL OF RIGHT TISSUE EXPANDERS WITH PLACEMENT OF PERMANENT BREAST IMPLANT;  Surgeon: Irene Limbo, MD;  Location: [place];  Service: Plastics;  Laterality: Right;  . Mastopexy Left 01/14/2014    Procedure: LEFT BREAST MASTOPEXY FOR SYMMETRY ;  Surgeon: Irene Limbo, MD;  Location: [place];  Service: Plastics;  Laterality: Left;  . Port-a-cath removal Left 01/14/2014  Procedure: REMOVAL PORT-A-CATH;  Surgeon: Irene Limbo, MD;  Location: [place];  Service: Plastics;  Laterality: Left;  . Colonoscopy N/A 06/05/2014    Procedure: COLONOSCOPY;  Surgeon: Danie Binder, MD;  [facility];  Service: Endoscopy;  Laterality: N/A;  1030 - moved to 10:45 - Ginger to notify pt   . Lumbar spine surgery    . Breast surgery      for abcesses- removed from both breasts, many yrs. ago    There were no vitals filed for this visit.  Visit Diagnosis:  Knee pain, left  Bilateral low back pain, with sciatica presence unspecified  Difficulty walking  Decreased functional activity tolerance      Subjective Assessment - 03/03/15  0935    Subjective Pt stated she is feeling good, pain scale 5/10 Bil buttock.  Has been comliant with HEP and reports ability to walk easier   Currently in Pain? Yes   Pain Score 5    Pain Location Buttocks   Pain Orientation Right;Left   Pain Descriptors / Indicators Aching            [facility] PT Assessment - 03/03/15 0001    Assessment   Medical Diagnosis LBP, L knee pain   Next MD Visit Aline Brochure- 08/13/15   Prior Therapy has had PT for back twice before   Precautions   Precautions None             [facility] Adult PT Treatment/Exercise - 03/03/15 0001    Exercises   Exercises Lumbar   Lumbar Exercises: Stretches   Active Hamstring Stretch 2 reps;30 seconds   Active Hamstring Stretch Limitations supine with rope   Single Knee to Chest Stretch 10 seconds;5 reps   Piriformis Stretch 30 seconds;3 reps   Piriformis Stretch Limitations seated   Lumbar Exercises: Standing   Other Standing Lumbar Exercises 3D hip excursion 10x   Lumbar Exercises: Supine   Ab Set 10 reps;5 seconds   AB Set Limitations cueing for ab sets   Bent Knee Raise 10 reps;5 seconds   Bent Knee Raise Limitations 5" holds cueing for stability   Bridge 10 reps;3 seconds                PT Education - 03/03/15 1144    Education provided Yes   Education Details Reviewed HEP and given HEP for core strengthening, encouraged pt to get 1/2 in heel lift for LLD   Methods Explanation;Handout;Demonstration   Comprehension Verbalized understanding;Returned demonstration          PT Short Term Goals - 03/03/15 1005    PT SHORT TERM GOAL #1   Title Pt will be independent with HEP.   Status On-going   PT SHORT TERM GOAL #2   Title Pt will demonstrate increased BLE strength by 5 time sit to stand time of 21 seconds.    Status On-going   PT SHORT TERM GOAL #3   Title Pt will ascend/descend 6 steps without increased pain in knee or low back.    PT SHORT TERM GOAL #4   Title Pt will report <3/10 pain  when getting out of a car or when standing for extended period of time.    Status On-going           PT Long Term Goals - 03/03/15 9983    PT LONG TERM GOAL #1   Title Pt will be independent in advanced HEP.    PT LONG TERM GOAL #  2   Title Pt will demonstrate increased BLE strength by 5 time sit to stand time of 17 seconds.    PT LONG TERM GOAL #3   Title Pt will ascend/descend 12 stairs without increased low back or knee pain.    PT LONG TERM GOAL #4   Title Pt will report <1/10 pain when getting in and out of car and when standing for extended periods of time.    PT LONG TERM GOAL #5   Title Pt will tolerate wearing 1 inch heel lift in L shoe for a full day without pain to correct LLD and improve gait mechanics.                Plan - 03/03/15 0957    Clinical Impression Statement Reviewed goals, compliance with HEP and copy of evaluation given to pt.  Began excursion exercise and piriformis stretches with min cueing for correct form to improve hip mobility and began core strengthening exercises with therapist facilitation to improve form and technique.  Pt reported decreased pain at end of session.   PT Next Visit Plan Continue with current PT POC to improve core and BLE strengthening.        Problem List Patient Active Problem List   Diagnosis Date Noted  . Unspecified constipation 05/21/2014  . FH: colon cancer 05/21/2014  . Breast cancer of upper-outer quadrant of right female breast 06/19/2013   Ihor Austin, LPTA; [place]  Aldona Lento 03/03/2015, 11:46 AM  [place], [place], [address] Phone: [phone]   Fax:  [phone]

## 2015-03-03 NOTE — Patient Instructions (Signed)
Small Ball Marching Supine   Lie supine with knees flexed. Raise one leg a few inches. Hold briefly, return and raise other leg. Keep hips stationary. Do 10 reps with 5 second holds with stomach muscles tight.  Copyright  VHI. All rights reserved.   Bridge   Lie back, legs bent. Inhale, pressing hips up. Keeping ribs in, lengthen lower back. Exhale, rolling down along spine from top. Repeat 10 times.  Copyright  VHI. All rights reserved.

## 2015-03-17 ENCOUNTER — Ambulatory Visit (HOSPITAL_COMMUNITY): Payer: 59

## 2015-03-17 DIAGNOSIS — M25562 Pain in left knee: Secondary | ICD-10-CM

## 2015-03-17 DIAGNOSIS — R6889 Other general symptoms and signs: Secondary | ICD-10-CM

## 2015-03-17 DIAGNOSIS — R262 Difficulty in walking, not elsewhere classified: Secondary | ICD-10-CM

## 2015-03-17 DIAGNOSIS — M545 Low back pain: Secondary | ICD-10-CM

## 2015-03-17 NOTE — Therapy (Signed)
[place], [place], [address] Phone: [phone]   Fax:  [phone]  Physical Therapy Treatment  Patient Details  Name: Morgan Santos MRN: [id number] Date of Birth: 10-23-1950 Referring Provider:  Nicholas Lose, MD  Encounter Date: 03/17/2015      PT End of Session - 03/17/15 1103    Visit Number 3   Number of Visits 8   Date for PT Re-Evaluation 03/28/15   Authorization Type [employer]   Authorization - Visit Number 3   Authorization - Number of Visits 10   PT Start Time 1022   PT Stop Time 1103   PT Time Calculation (min) 41 min   Activity Tolerance Patient tolerated treatment well   Behavior During Therapy [facility] for tasks assessed/performed      Past Medical History  Diagnosis Date  . Breast cancer   . Anxiety   . GERD (gastroesophageal reflux disease)     pt. uses vinegar for heartburn, ususally once per week   . Hypertension     "when I get upset"  Not on medication  . Stroke ?2010 or 2011    2010, [facility] for stroke "mini stroke" short term memory problems since  . Anemia     when she was younger  . Constipation   . Complication of anesthesia   . PONV (postoperative nausea and vomiting)     "just for one day"  . Wears glasses   . Wears dentures     top  . H/O degenerative disc disease   . Arthritis     hip- R, back; osteoarthritis  . Hyperlipidemia   . Depression     pt. remarks that she "takes respridal because if I don't take it I get angry"     Past Surgical History  Procedure Laterality Date  . Cholecystectomy    . Mastectomy w/ sentinel node biopsy Right 07/12/2013    Procedure: MASTECTOMY WITH SENTINEL LYMPH NODE BIOPSY;  Surgeon: Rolm Bookbinder, MD;  Location: [place];  Service: General;  Laterality: Right;  . Breast reconstruction with placement of tissue expander and flex hd (acellular hydrated dermis) Right 07/12/2013    Procedure: RIGHT BREAST RECONSTRUCTION WITH  PLACEMENT OF TISSUE EXPANDER AND FLEX HD TO RIGHT BREAST (ACELLULAR HYDRATED DERMIS);  Surgeon: Irene Limbo, MD;  Location: [place];  Service: Plastics;  Laterality: Right;  . Vaginal hysterectomy    . Colonoscopy   01/29/2004    NUR:A 7-8 mm polyp snared from the hepatic flexure.  Another 3 mm polyp was     cold snared from the sigmoid colon.External hemorrhoids, possible source of recent rectal bleeding  . Mastectomy    . Tubal ligation    . Dilation and curettage of uterus    . Portacath placement N/A 09/16/2013    Procedure: INSERTION PORT-A-CATH;  Surgeon: Rolm Bookbinder, MD;  Location: [place];  Service: General;  Laterality: N/A;  . Removal of bilateral tissue expanders with placement of bilateral breast implants Right 01/14/2014    Procedure: REMOVAL OF RIGHT TISSUE EXPANDERS WITH PLACEMENT OF PERMANENT BREAST IMPLANT;  Surgeon: Irene Limbo, MD;  Location: [place];  Service: Plastics;  Laterality: Right;  . Mastopexy Left 01/14/2014    Procedure: LEFT BREAST MASTOPEXY FOR SYMMETRY ;  Surgeon: Irene Limbo, MD;  Location: [place];  Service: Plastics;  Laterality: Left;  . Port-a-cath removal Left 01/14/2014    Procedure: REMOVAL PORT-A-CATH;  Surgeon: Irene Limbo,  MD;  Location: [place];  Service: Plastics;  Laterality: Left;  . Colonoscopy N/A 06/05/2014    Procedure: COLONOSCOPY;  Surgeon: Danie Binder, MD;  [facility];  Service: Endoscopy;  Laterality: N/A;  1030 - moved to 10:45 - Ginger to notify pt   . Lumbar spine surgery    . Breast surgery      for abcesses- removed from both breasts, many yrs. ago    There were no vitals filed for this visit.  Visit Diagnosis:  Knee pain, left  Bilateral low back pain, with sciatica presence unspecified  Difficulty walking  Decreased functional activity tolerance      Subjective Assessment - 03/17/15 1047    Subjective Pt stated she is pain free but  does have lower back pain with standing extension.     Currently in Pain? No/denies            [facility] Adult PT Treatment/Exercise - 03/17/15 0001    Lumbar Exercises: Stretches   Active Hamstring Stretch 2 reps;30 seconds   Active Hamstring Stretch Limitations supine with rope   Pelvic Tilt Limitations seated anterior/posterior   Lumbar Exercises: Aerobic   Tread Mill Incline 3 x 5' with ab set following MET   Lumbar Exercises: Standing   Other Standing Lumbar Exercises 3D hip excursion 10x   Lumbar Exercises: Supine   Ab Set 10 reps;5 seconds   AB Set Limitations cueing for ab sets   Bent Knee Raise 10 reps;5 seconds   Bent Knee Raise Limitations 5" holds cueing for stability   Bridge 10 reps;3 seconds   Bridge Limitations Lt foot closer   Straight Leg Raise 10 reps   Straight Leg Raises Limitations Rt LE only   Manual Therapy   Manual Therapy Muscle Energy Technique   Manual therapy comments Lt anterior rotation f/b instruction for core strengthening and treadmile with PFC                  PT Short Term Goals - 03/17/15 1116    PT SHORT TERM GOAL #1   Title Pt will be independent with HEP.   Status On-going   PT SHORT TERM GOAL #2   Title Pt will demonstrate increased BLE strength by 5 time sit to stand time of 21 seconds.    Status On-going   PT SHORT TERM GOAL #3   Title Pt will ascend/descend 6 steps without increased pain in knee or low back.    PT SHORT TERM GOAL #4   Title Pt will report <3/10 pain when getting out of a car or when standing for extended period of time.    Status On-going           PT Long Term Goals - 03/17/15 1116    PT LONG TERM GOAL #1   Title Pt will be independent in advanced HEP.    PT LONG TERM GOAL #2   Title Pt will demonstrate increased BLE strength by 5 time sit to stand time of 17 seconds.    PT LONG TERM GOAL #3   Title Pt will ascend/descend 12 stairs without increased low back or knee pain.    PT LONG TERM GOAL  #4   Title Pt will report <1/10 pain when getting in and out of car and when standing for extended periods of time.    PT LONG TERM GOAL #5   Title Pt will tolerate wearing 1 inch heel lift in L  shoe for a full day without pain to correct LLD and improve gait mechanics.                Plan - 03/17/15 1107    Clinical Impression Statement Pt. c/o sharp pain SI region with lumbar extension.  Noted leg length discrepancy with pain with extension.  Muscle energy technique for Lt SI anterior rotation followed by education on importance of core strengthening to assist with SI alignment.  No leg length discrepancy following MET and no reports of pain with extension following MET.  Tactile and verbal cueing required for correct core activation due to weakness.and to breath during contraction.     PT Next Visit Plan Continue with current PT POC.  Check SI, MET PRN.  Focus on core and BLE strengthening.  Continue 3D hip excursion, core strengthenig/stabiltiy exercises.  Begin prone exercises and forward lunges with ab sets as ready.        Problem List Patient Active Problem List   Diagnosis Date Noted  . Unspecified constipation 05/21/2014  . FH: colon cancer 05/21/2014  . Breast cancer of upper-outer quadrant of right female breast 06/19/2013   Ihor Austin, [place]; [place]  Aldona Lento 03/17/2015, 11:18 AM  [place], [place], [address] Phone: [phone]   Fax:  [phone]

## 2015-03-19 ENCOUNTER — Ambulatory Visit (HOSPITAL_COMMUNITY): Payer: 59 | Admitting: Physical Therapy

## 2015-03-19 DIAGNOSIS — R6889 Other general symptoms and signs: Secondary | ICD-10-CM

## 2015-03-19 DIAGNOSIS — M25562 Pain in left knee: Secondary | ICD-10-CM

## 2015-03-19 DIAGNOSIS — R262 Difficulty in walking, not elsewhere classified: Secondary | ICD-10-CM

## 2015-03-19 DIAGNOSIS — M545 Low back pain: Secondary | ICD-10-CM

## 2015-03-19 NOTE — Therapy (Signed)
[place], [place], [address] Phone: [phone]   Fax:  [phone]  Physical Therapy Treatment  Patient Details  Name: Morgan Santos MRN: [id number] Date of Birth: 09-02-1950 Referring Provider:  Carole Civil, MD  Encounter Date: 03/19/2015      PT End of Session - 03/19/15 1216    Visit Number 4   Number of Visits 8   Date for PT Re-Evaluation 03/28/15   Authorization Type [employer]   Authorization - Visit Number 4   Authorization - Number of Visits 10   PT Start Time 4315   PT Stop Time 1058   PT Time Calculation (min) 43 min   Activity Tolerance Patient tolerated treatment well   Behavior During Therapy [facility] for tasks assessed/performed      Past Medical History  Diagnosis Date  . Breast cancer   . Anxiety   . GERD (gastroesophageal reflux disease)     pt. uses vinegar for heartburn, ususally once per week   . Hypertension     "when I get upset"  Not on medication  . Stroke ?2010 or 2011    2010, [facility] for stroke "mini stroke" short term memory problems since  . Anemia     when she was younger  . Constipation   . Complication of anesthesia   . PONV (postoperative nausea and vomiting)     "just for one day"  . Wears glasses   . Wears dentures     top  . H/O degenerative disc disease   . Arthritis     hip- R, back; osteoarthritis  . Hyperlipidemia   . Depression     pt. remarks that she "takes respridal because if I don't take it I get angry"     Past Surgical History  Procedure Laterality Date  . Cholecystectomy    . Mastectomy w/ sentinel node biopsy Right 07/12/2013    Procedure: MASTECTOMY WITH SENTINEL LYMPH NODE BIOPSY;  Surgeon: Rolm Bookbinder, MD;  Location: [place];  Service: General;  Laterality: Right;  . Breast reconstruction with placement of tissue expander and flex hd (acellular hydrated dermis) Right 07/12/2013    Procedure: RIGHT BREAST RECONSTRUCTION WITH  PLACEMENT OF TISSUE EXPANDER AND FLEX HD TO RIGHT BREAST (ACELLULAR HYDRATED DERMIS);  Surgeon: Irene Limbo, MD;  Location: [place];  Service: Plastics;  Laterality: Right;  . Vaginal hysterectomy    . Colonoscopy   01/29/2004    NUR:A 7-8 mm polyp snared from the hepatic flexure.  Another 3 mm polyp was     cold snared from the sigmoid colon.External hemorrhoids, possible source of recent rectal bleeding  . Mastectomy    . Tubal ligation    . Dilation and curettage of uterus    . Portacath placement N/A 09/16/2013    Procedure: INSERTION PORT-A-CATH;  Surgeon: Rolm Bookbinder, MD;  Location: [place];  Service: General;  Laterality: N/A;  . Removal of bilateral tissue expanders with placement of bilateral breast implants Right 01/14/2014    Procedure: REMOVAL OF RIGHT TISSUE EXPANDERS WITH PLACEMENT OF PERMANENT BREAST IMPLANT;  Surgeon: Irene Limbo, MD;  Location: [place];  Service: Plastics;  Laterality: Right;  . Mastopexy Left 01/14/2014    Procedure: LEFT BREAST MASTOPEXY FOR SYMMETRY ;  Surgeon: Irene Limbo, MD;  Location: [place];  Service: Plastics;  Laterality: Left;  . Port-a-cath removal Left 01/14/2014    Procedure: REMOVAL PORT-A-CATH;  Surgeon: Arnoldo Hooker  Iran Planas, MD;  Location: [place];  Service: Plastics;  Laterality: Left;  . Colonoscopy N/A 06/05/2014    Procedure: COLONOSCOPY;  Surgeon: Danie Binder, MD;  [facility];  Service: Endoscopy;  Laterality: N/A;  1030 - moved to 10:45 - Ginger to notify pt   . Lumbar spine surgery    . Breast surgery      for abcesses- removed from both breasts, many yrs. ago    There were no vitals filed for this visit.  Visit Diagnosis:  Knee pain, left  Bilateral low back pain, with sciatica presence unspecified  Difficulty walking  Decreased functional activity tolerance      Subjective Assessment - 03/19/15 1018    Subjective Pt reports that she isn't  really having any pain, but she is having some soreness in her L knee today.    Currently in Pain? No/denies                          [facility] Adult PT Treatment/Exercise - 03/19/15 0001    Lumbar Exercises: Stretches   Active Hamstring Stretch 30 seconds;3 reps   Active Hamstring Stretch Limitations supine with rope   Single Knee to Chest Stretch 10 seconds;5 reps   Pelvic Tilt Limitations 3" x 10   Piriformis Stretch 30 seconds;3 reps   Piriformis Stretch Limitations seated   Lumbar Exercises: Standing   Forward Lunge 10 reps   Forward Lunge Limitations at 7" step   Lumbar Exercises: Supine   Ab Set 10 reps;5 seconds   AB Set Limitations cueing for ab sets   Bent Knee Raise 10 reps;5 seconds   Bridge 10 reps;3 seconds   Bridge Limitations Lt foot closer   Lumbar Exercises: Sidelying   Clam 10 reps   Lumbar Exercises: Prone   Straight Leg Raise 10 reps  2 sets   Manual Therapy   Manual Therapy Soft tissue mobilization;Myofascial release   Soft tissue mobilization STM to L piriformis and ITB   Myofascial Release Trigger point release L piriformis                PT Education - 03/19/15 1216    Education provided Yes   Education Details educated on use of tennis ball for piriformis release          PT Short Term Goals - 03/17/15 1116    PT SHORT TERM GOAL #1   Title Pt will be independent with HEP.   Status On-going   PT SHORT TERM GOAL #2   Title Pt will demonstrate increased BLE strength by 5 time sit to stand time of 21 seconds.    Status On-going   PT SHORT TERM GOAL #3   Title Pt will ascend/descend 6 steps without increased pain in knee or low back.    PT SHORT TERM GOAL #4   Title Pt will report <3/10 pain when getting out of a car or when standing for extended period of time.    Status On-going           PT Long Term Goals - 03/17/15 1116    PT LONG TERM GOAL #1   Title Pt will be independent in advanced HEP.    PT LONG TERM  GOAL #2   Title Pt will demonstrate increased BLE strength by 5 time sit to stand time of 17 seconds.    PT LONG TERM GOAL #3   Title Pt will ascend/descend  12 stairs without increased low back or knee pain.    PT LONG TERM GOAL #4   Title Pt will report <1/10 pain when getting in and out of car and when standing for extended periods of time.    PT LONG TERM GOAL #5   Title Pt will tolerate wearing 1 inch heel lift in L shoe for a full day without pain to correct LLD and improve gait mechanics.                Plan - 03/19/15 1252    Clinical Impression Statement Pt demonstrated LLD that was corrected with isometric hip abd/adduction and bridging. She denied any pain with bridging following MET. Pt demonstrated increased tightness in L piriformis and L ITB that improved with manual therapy, and pt was educated on using a tennis ball for piriformis release at home.    PT Next Visit Plan  Check SI, MET PRN.  Focus on core and BLE strengthening.  Continue supine and prone core stabilization exercises        Problem List Patient Active Problem List   Diagnosis Date Noted  . Unspecified constipation 05/21/2014  . FH: colon cancer 05/21/2014  . Breast cancer of upper-outer quadrant of right female breast 06/19/2013    Hilma Favors, PT, DPT [phone] 03/19/2015, 12:57 PM  [place], [place], [address] Phone: [phone]   Fax:  [phone]

## 2015-03-24 ENCOUNTER — Ambulatory Visit (HOSPITAL_COMMUNITY): Payer: 59 | Attending: Hematology and Oncology | Admitting: Physical Therapy

## 2015-03-24 DIAGNOSIS — R6889 Other general symptoms and signs: Secondary | ICD-10-CM | POA: Insufficient documentation

## 2015-03-24 DIAGNOSIS — R262 Difficulty in walking, not elsewhere classified: Secondary | ICD-10-CM | POA: Insufficient documentation

## 2015-03-24 DIAGNOSIS — M545 Low back pain: Secondary | ICD-10-CM | POA: Diagnosis present

## 2015-03-24 DIAGNOSIS — M25562 Pain in left knee: Secondary | ICD-10-CM | POA: Diagnosis present

## 2015-03-24 NOTE — Therapy (Signed)
[place], [place], [address] Phone: [phone]   Fax:  [phone]  Physical Therapy Treatment  Patient Details  Name: Morgan Santos MRN: [id number] Date of Birth: [birth year] Referring Provider:  Nicholas Lose, MD  Encounter Date: 03/24/2015      PT End of Session - 03/24/15 1455    Visit Number 5   Number of Visits 8   Date for PT Re-Evaluation 03/28/15   Authorization Type [employer]   Authorization - Visit Number 5   Authorization - Number of Visits 10   PT Start Time 0814   PT Stop Time 1144   PT Time Calculation (min) 42 min   Activity Tolerance Patient tolerated treatment well   Behavior During Therapy [facility] for tasks assessed/performed      Past Medical History  Diagnosis Date  . Breast cancer   . Anxiety   . GERD (gastroesophageal reflux disease)     pt. uses vinegar for heartburn, ususally once per week   . Hypertension     "when I get upset"  Not on medication  . Stroke ?2010 or 2011    2010, [facility] for stroke "mini stroke" short term memory problems since  . Anemia     when she was younger  . Constipation   . Complication of anesthesia   . PONV (postoperative nausea and vomiting)     "just for one day"  . Wears glasses   . Wears dentures     top  . H/O degenerative disc disease   . Arthritis     hip- R, back; osteoarthritis  . Hyperlipidemia   . Depression     pt. remarks that she "takes respridal because if I don't take it I get angry"     Past Surgical History  Procedure Laterality Date  . Cholecystectomy    . Mastectomy w/ sentinel node biopsy Right 07/12/2013    Procedure: MASTECTOMY WITH SENTINEL LYMPH NODE BIOPSY;  Surgeon: Rolm Bookbinder, MD;  Location: [place];  Service: General;  Laterality: Right;  . Breast reconstruction with placement of tissue expander and flex hd (acellular hydrated dermis) Right 07/12/2013    Procedure: RIGHT BREAST RECONSTRUCTION WITH  PLACEMENT OF TISSUE EXPANDER AND FLEX HD TO RIGHT BREAST (ACELLULAR HYDRATED DERMIS);  Surgeon: Irene Limbo, MD;  Location: [place];  Service: Plastics;  Laterality: Right;  . Vaginal hysterectomy    . Colonoscopy   01/29/2004    NUR:A 7-8 mm polyp snared from the hepatic flexure.  Another 3 mm polyp was     cold snared from the sigmoid colon.External hemorrhoids, possible source of recent rectal bleeding  . Mastectomy    . Tubal ligation    . Dilation and curettage of uterus    . Portacath placement N/A 09/16/2013    Procedure: INSERTION PORT-A-CATH;  Surgeon: Rolm Bookbinder, MD;  Location: [place];  Service: General;  Laterality: N/A;  . Removal of bilateral tissue expanders with placement of bilateral breast implants Right 01/14/2014    Procedure: REMOVAL OF RIGHT TISSUE EXPANDERS WITH PLACEMENT OF PERMANENT BREAST IMPLANT;  Surgeon: Irene Limbo, MD;  Location: [place];  Service: Plastics;  Laterality: Right;  . Mastopexy Left 01/14/2014    Procedure: LEFT BREAST MASTOPEXY FOR SYMMETRY ;  Surgeon: Irene Limbo, MD;  Location: [place];  Service: Plastics;  Laterality: Left;  . Port-a-cath removal Left 01/14/2014    Procedure: REMOVAL PORT-A-CATH;  Surgeon: Irene Limbo,  MD;  Location: [place];  Service: Plastics;  Laterality: Left;  . Colonoscopy N/A 06/05/2014    Procedure: COLONOSCOPY;  Surgeon: Danie Binder, MD;  [facility];  Service: Endoscopy;  Laterality: N/A;  1030 - moved to 10:45 - [place] to notify pt   . Lumbar spine surgery    . Breast surgery      for abcesses- removed from both breasts, many yrs. ago    There were no vitals filed for this visit.  Visit Diagnosis:  Knee pain, left  Bilateral low back pain, with sciatica presence unspecified  Difficulty walking      Subjective Assessment - 03/24/15 1104    Subjective Pt reports that she has not been taking her pain medication as much, and  the last pill she took was on Saturday. Her knee pain is a 5/10 today. She reports that he felt relief following manual therapy last session.   Currently in Pain? No/denies   Pain Score 5    Pain Location Knee   Pain Orientation Left   Pain Descriptors / Indicators Aching   Multiple Pain Sites No                         [facility] Adult PT Treatment/Exercise - 03/24/15 0001    Lumbar Exercises: Stretches   Active Hamstring Stretch 3 reps;30 seconds   Active Hamstring Stretch Limitations 14" step   ITB Stretch 30 seconds   Piriformis Stretch 3 reps;30 seconds   Piriformis Stretch Limitations seated   Lumbar Exercises: Standing   Other Standing Lumbar Exercises hip hike at 4" step x 10 bilaterally   Other Standing Lumbar Exercises sidestepping with red tband x 3RT   Lumbar Exercises: Supine   Bridge 10 reps   Manual Therapy   Manual Therapy Soft tissue mobilization;Myofascial release   Manual therapy comments muscle play to L hamstring   Soft tissue mobilization STM to L piriformis and ITB   Myofascial Release Trigger point release L piriformis                  PT Short Term Goals - 03/17/15 1116    PT SHORT TERM GOAL #1   Title Pt will be independent with HEP.   Status On-going   PT SHORT TERM GOAL #2   Title Pt will demonstrate increased BLE strength by 5 time sit to stand time of 21 seconds.    Status On-going   PT SHORT TERM GOAL #3   Title Pt will ascend/descend 6 steps without increased pain in knee or low back.    PT SHORT TERM GOAL #4   Title Pt will report <3/10 pain when getting out of a car or when standing for extended period of time.    Status On-going           PT Long Term Goals - 03/17/15 1116    PT LONG TERM GOAL #1   Title Pt will be independent in advanced HEP.    PT LONG TERM GOAL #2   Title Pt will demonstrate increased BLE strength by 5 time sit to stand time of 17 seconds.    PT LONG TERM GOAL #3   Title Pt will  ascend/descend 12 stairs without increased low back or knee pain.    PT LONG TERM GOAL #4   Title Pt will report <1/10 pain when getting in and out of car and when standing for extended  periods of time.    PT LONG TERM GOAL #5   Title Pt will tolerate wearing 1 inch heel lift in L shoe for a full day without pain to correct LLD and improve gait mechanics.                Plan - 03/24/15 1455    Clinical Impression Statement Pt continues to c/o pain in her lateral L knee. She reported decreased pain following manual therapy performed on L piriformis and ITB, and was educated on how to stretch ITB at home. She will benefit from continued abductor strengthening to    PT Next Visit Plan  Check SI, MET PRN.  Focus on core and BLE strengthening, continue with hip abductor strengthening        Problem List Patient Active Problem List   Diagnosis Date Noted  . Unspecified constipation 05/21/2014  . FH: colon cancer 05/21/2014  . Breast cancer of upper-outer quadrant of right female breast 06/19/2013    Hilma Favors, PT, DPT [phone] 03/24/2015, 2:58 PM  [place], [place], [address] Phone: [phone]   Fax:  [phone]

## 2015-03-26 ENCOUNTER — Ambulatory Visit (HOSPITAL_COMMUNITY): Payer: 59

## 2015-03-26 DIAGNOSIS — M545 Low back pain: Secondary | ICD-10-CM

## 2015-03-26 DIAGNOSIS — M25562 Pain in left knee: Secondary | ICD-10-CM

## 2015-03-26 DIAGNOSIS — R6889 Other general symptoms and signs: Secondary | ICD-10-CM

## 2015-03-26 DIAGNOSIS — R262 Difficulty in walking, not elsewhere classified: Secondary | ICD-10-CM

## 2015-03-26 NOTE — Therapy (Signed)
[place], [place], [address] Phone: [phone]   Fax:  [phone]  Physical Therapy Treatment  Patient Details  Name: Morgan Santos MRN: [id number] Date of Birth: 07-31-1951 Referring Provider:  Nicholas Lose, MD  Encounter Date: 03/26/2015      PT End of Session - 03/26/15 1148    Visit Number 6   Number of Visits 8   Date for PT Re-Evaluation 03/28/15   Authorization Type [employer]   Authorization - Visit Number 6   Authorization - Number of Visits 10   PT Start Time 4193   PT Stop Time 1147   PT Time Calculation (min) 42 min   Activity Tolerance Patient tolerated treatment well   Behavior During Therapy [facility] for tasks assessed/performed      Past Medical History  Diagnosis Date  . Breast cancer   . Anxiety   . GERD (gastroesophageal reflux disease)     pt. uses vinegar for heartburn, ususally once per week   . Hypertension     "when I get upset"  Not on medication  . Stroke ?2010 or 2011    2010, [facility] for stroke "mini stroke" short term memory problems since  . Anemia     when she was younger  . Constipation   . Complication of anesthesia   . PONV (postoperative nausea and vomiting)     "just for one day"  . Wears glasses   . Wears dentures     top  . H/O degenerative disc disease   . Arthritis     hip- R, back; osteoarthritis  . Hyperlipidemia   . Depression     pt. remarks that she "takes respridal because if I don't take it I get angry"     Past Surgical History  Procedure Laterality Date  . Cholecystectomy    . Mastectomy w/ sentinel node biopsy Right 07/12/2013    Procedure: MASTECTOMY WITH SENTINEL LYMPH NODE BIOPSY;  Surgeon: Rolm Bookbinder, MD;  Location: [place];  Service: General;  Laterality: Right;  . Breast reconstruction with placement of tissue expander and flex hd (acellular hydrated dermis) Right 07/12/2013    Procedure: RIGHT BREAST RECONSTRUCTION WITH  PLACEMENT OF TISSUE EXPANDER AND FLEX HD TO RIGHT BREAST (ACELLULAR HYDRATED DERMIS);  Surgeon: Irene Limbo, MD;  Location: [place];  Service: Plastics;  Laterality: Right;  . Vaginal hysterectomy    . Colonoscopy   01/29/2004    NUR:A 7-8 mm polyp snared from the hepatic flexure.  Another 3 mm polyp was     cold snared from the sigmoid colon.External hemorrhoids, possible source of recent rectal bleeding  . Mastectomy    . Tubal ligation    . Dilation and curettage of uterus    . Portacath placement N/A 09/16/2013    Procedure: INSERTION PORT-A-CATH;  Surgeon: Rolm Bookbinder, MD;  Location: [place];  Service: General;  Laterality: N/A;  . Removal of bilateral tissue expanders with placement of bilateral breast implants Right 01/14/2014    Procedure: REMOVAL OF RIGHT TISSUE EXPANDERS WITH PLACEMENT OF PERMANENT BREAST IMPLANT;  Surgeon: Irene Limbo, MD;  Location: [place];  Service: Plastics;  Laterality: Right;  . Mastopexy Left 01/14/2014    Procedure: LEFT BREAST MASTOPEXY FOR SYMMETRY ;  Surgeon: Irene Limbo, MD;  Location: [place];  Service: Plastics;  Laterality: Left;  . Port-a-cath removal Left 01/14/2014    Procedure: REMOVAL PORT-A-CATH;  Surgeon: Irene Limbo,  MD;  Location: [place];  Service: Plastics;  Laterality: Left;  . Colonoscopy N/A 06/05/2014    Procedure: COLONOSCOPY;  Surgeon: Danie Binder, MD;  [facility];  Service: Endoscopy;  Laterality: N/A;  1030 - moved to 10:45 - Ginger to notify pt   . Lumbar spine surgery    . Breast surgery      for abcesses- removed from both breasts, many yrs. ago    There were no vitals filed for this visit.  Visit Diagnosis:  Knee pain, left  Bilateral low back pain, with sciatica presence unspecified  Difficulty walking  Decreased functional activity tolerance      Subjective Assessment - 03/26/15 1108    Subjective Pt stated she was out in her  garden this morning and reached forward for some peas and had sharp pain in Rt lower back.   Currently in Pain? Yes   Pain Score 5    Pain Location Back   Pain Orientation Lower;Right   Pain Descriptors / Indicators Sharp;Aching           [facility] Adult PT Treatment/Exercise - 03/26/15 0001    Lumbar Exercises: Aerobic   Tread Mill Incline 3 x 5' at 2.0 mph with ab set following MET   Lumbar Exercises: Standing   Functional Squats 10 reps   Functional Squats Limitations infront of chair, cueing for weight loading   Forward Lunge 10 reps   Forward Lunge Limitations 4in step   Other Standing Lumbar Exercises hip hike at 4" step x 15 bilaterally   Other Standing Lumbar Exercises sidestepping with red tband x 3RT   Lumbar Exercises: Supine   Bent Knee Raise 10 reps;5 seconds   Bent Knee Raise Limitations 5" holds cueing for stability   Bridge 10 reps   Bridge Limitations 2 sets 1st)neutral, 2nd) Rt foot closer following MET   Straight Leg Raise 10 reps   Straight Leg Raises Limitations Lt LE only   Lumbar Exercises: Sidelying   Hip Abduction Limitations next session   Manual Therapy   Manual Therapy Muscle Energy Technique   Manual therapy comments Rt SI anterior rotation f/b gait training and core strengthening            PT Short Term Goals - 03/26/15 1611    PT SHORT TERM GOAL #1   Title Pt will be independent with HEP.   Status On-going   PT SHORT TERM GOAL #2   Title Pt will demonstrate increased BLE strength by 5 time sit to stand time of 21 seconds.    Status On-going   PT SHORT TERM GOAL #3   Title Pt will ascend/descend 6 steps without increased pain in knee or low back.    PT SHORT TERM GOAL #4   Title Pt will report <3/10 pain when getting out of a car or when standing for extended period of time.            PT Long Term Goals - 03/26/15 1611    PT LONG TERM GOAL #1   Title Pt will be independent in advanced HEP.    PT LONG TERM GOAL #2   Title Pt will  demonstrate increased BLE strength by 5 time sit to stand time of 17 seconds.    PT LONG TERM GOAL #3   Title Pt will ascend/descend 12 stairs without increased low back or knee pain.    PT LONG TERM GOAL #4   Title Pt will  report <1/10 pain when getting in and out of car and when standing for extended periods of time.    PT LONG TERM GOAL #5   Title Pt will tolerate wearing 1 inch heel lift in L shoe for a full day without pain to correct LLD and improve gait mechanics.                Plan - 03/26/15 1553    Clinical Impression Statement Pt with c/o sharp pain with handplacement over Rt PSIS, further examanation noted Rt LLD.  Muscle energy technique complete for Rt SI anterior rotation with reports of sharp pain resolved.  Session focus on improving core strengthening and instructing proper body mechanics fior lifting and reaching forward.  Therapist facilitation for proper mechanics with mod multimodal cueing with squats and lunges.     PT Next Visit Plan Reassess next session.  Check SI, MET PRN.  Focus on core and BLE strengthening, continue with hip abductor strengthening.  Next session focus on proper body mechanics with lifting and reaching.        Problem List Patient Active Problem List   Diagnosis Date Noted  . Unspecified constipation 05/21/2014  . FH: colon cancer 05/21/2014  . Breast cancer of upper-outer quadrant of right female breast 06/19/2013   Ihor Austin, [place]; [place]  Aldona Lento 03/26/2015, [place], [place], [address] Phone: [phone]   Fax:  [phone]

## 2015-03-31 ENCOUNTER — Ambulatory Visit (HOSPITAL_COMMUNITY): Payer: 59 | Admitting: Physical Therapy

## 2015-03-31 DIAGNOSIS — R262 Difficulty in walking, not elsewhere classified: Secondary | ICD-10-CM

## 2015-03-31 DIAGNOSIS — M25562 Pain in left knee: Secondary | ICD-10-CM

## 2015-03-31 DIAGNOSIS — R6889 Other general symptoms and signs: Secondary | ICD-10-CM

## 2015-03-31 DIAGNOSIS — M545 Low back pain: Secondary | ICD-10-CM

## 2015-03-31 NOTE — Therapy (Signed)
[place], [place], [address] Phone: [phone]   Fax:  [phone]  Physical Therapy Treatment (Reassessment)  Patient Details  Name: Morgan Santos MRN: [id number] Date of Birth: [birth year] Referring Provider:  Nicholas Lose, MD  Encounter Date: 03/31/2015      PT End of Session - 03/31/15 1152    Visit Number 7   Number of Visits 10   Date for PT Re-Evaluation 04/24/15   Authorization Type [employer]   Authorization - Visit Number 7   Authorization - Number of Visits 10   PT Start Time 5726   PT Stop Time 1145   PT Time Calculation (min) 40 min   Activity Tolerance Patient tolerated treatment well   Behavior During Therapy [facility] for tasks assessed/performed      Past Medical History  Diagnosis Date  . Breast cancer   . Anxiety   . GERD (gastroesophageal reflux disease)     pt. uses vinegar for heartburn, ususally once per week   . Hypertension     "when I get upset"  Not on medication  . Stroke ?2010 or 2011    2010, [facility] for stroke "mini stroke" short term memory problems since  . Anemia     when she was younger  . Constipation   . Complication of anesthesia   . PONV (postoperative nausea and vomiting)     "just for one day"  . Wears glasses   . Wears dentures     top  . H/O degenerative disc disease   . Arthritis     hip- R, back; osteoarthritis  . Hyperlipidemia   . Depression     pt. remarks that she "takes respridal because if I don't take it I get angry"     Past Surgical History  Procedure Laterality Date  . Cholecystectomy    . Mastectomy w/ sentinel node biopsy Right 07/12/2013    Procedure: MASTECTOMY WITH SENTINEL LYMPH NODE BIOPSY;  Surgeon: Rolm Bookbinder, MD;  Location: [place];  Service: General;  Laterality: Right;  . Breast reconstruction with placement of tissue expander and flex hd (acellular hydrated dermis) Right 07/12/2013    Procedure: RIGHT BREAST  RECONSTRUCTION WITH PLACEMENT OF TISSUE EXPANDER AND FLEX HD TO RIGHT BREAST (ACELLULAR HYDRATED DERMIS);  Surgeon: Irene Limbo, MD;  Location: [place];  Service: Plastics;  Laterality: Right;  . Vaginal hysterectomy    . Colonoscopy   01/29/2004    NUR:A 7-8 mm polyp snared from the hepatic flexure.  Another 3 mm polyp was     cold snared from the sigmoid colon.External hemorrhoids, possible source of recent rectal bleeding  . Mastectomy    . Tubal ligation    . Dilation and curettage of uterus    . Portacath placement N/A 09/16/2013    Procedure: INSERTION PORT-A-CATH;  Surgeon: Rolm Bookbinder, MD;  Location: [place];  Service: General;  Laterality: N/A;  . Removal of bilateral tissue expanders with placement of bilateral breast implants Right 01/14/2014    Procedure: REMOVAL OF RIGHT TISSUE EXPANDERS WITH PLACEMENT OF PERMANENT BREAST IMPLANT;  Surgeon: Irene Limbo, MD;  Location: [place];  Service: Plastics;  Laterality: Right;  . Mastopexy Left 01/14/2014    Procedure: LEFT BREAST MASTOPEXY FOR SYMMETRY ;  Surgeon: Irene Limbo, MD;  Location: [place];  Service: Plastics;  Laterality: Left;  . Port-a-cath removal Left 01/14/2014    Procedure: REMOVAL PORT-A-CATH;  Surgeon: Arnoldo Hooker  Iran Planas, MD;  Location: [place];  Service: Plastics;  Laterality: Left;  . Colonoscopy N/A 06/05/2014    Procedure: COLONOSCOPY;  Surgeon: Danie Binder, MD;  [facility];  Service: Endoscopy;  Laterality: N/A;  1030 - moved to 10:45 - Ginger to notify pt   . Lumbar spine surgery    . Breast surgery      for abcesses- removed from both breasts, many yrs. ago    There were no vitals filed for this visit.  Visit Diagnosis:  Knee pain, left  Bilateral low back pain, with sciatica presence unspecified  Difficulty walking  Decreased functional activity tolerance      Subjective Assessment - 03/31/15 1106    Subjective Pt reports  that she doesn't have any real pain today, just some soreness in her L knee. She has managed to stay off of her pain medicine for the past week. Pt reports that she still has some pain in her lower back when getting in and out of a car, and she begins to feel an ache in her back and numbness in her legs after walking for about 20-25 minutes. She reports that overall, she has felt a great deal of improvement.    How long can you sit comfortably? no limitations   How long can you stand comfortably? 25 minutes   How long can you walk comfortably? 25 minutes before getting numbness in legs   Currently in Pain? No/denies   Pain Score 0-No pain            [facility] PT Assessment - 03/31/15 0001    Observation/Other Assessments   Focus on Therapeutic Outcomes (FOTO)  37% limitation   Sit to Stand   Comments 5 time sit to stand: 12.43 seconds   AROM   Lumbar Flexion 95  was 79   Lumbar Extension 16  was 10   Lumbar - Right Side Bend 28  was 20   Lumbar - Left Side Bend 33  was 25   PROM   Right Hip External Rotation  40   Right Hip Internal Rotation  44   Left Hip External Rotation  40   Left Hip Internal Rotation  41   Strength   Right Hip Flexion 4-/5  was 3-   Right Hip Extension 3+/5  was 3-   Right Hip ABduction 3+/5  was 3-   Left Hip Flexion 4-/5  was 3-   Left Hip Extension 3+/5  was 3-   Left Hip ABduction 3+/5  was 3-   Right Knee Flexion 4+/5  was 4   Right Knee Extension 5/5  was 4   Left Knee Flexion 4+/5  was 4   Left Knee Extension 4+/5  was 4            [facility] Adult PT Treatment/Exercise - 03/31/15 0001    Ambulation/Gait   Stairs Yes   Stairs Assistance 7: Independent   Stair Management Technique Alternating pattern   Number of Stairs 4  x3 RT   Height of Stairs 7   Lumbar Exercises: Stretches   Active Hamstring Stretch 3 reps;30 seconds   Active Hamstring Stretch Limitations 14" step   Single Knee to Chest Stretch 10 seconds;5 reps    Piriformis Stretch 3 reps;30 seconds   Piriformis Stretch Limitations seated   Lumbar Exercises: Standing   Forward Lunge 10 reps   Forward Lunge Limitations 4in step   Other Standing Lumbar Exercises sidestepping  with green tband x 3RT   Lumbar Exercises: Sidelying   Hip Abduction 10 reps                PT Education - 03/31/15 1152    Education provided Yes   Education Details Educated on progress toward goals, added sidestepping with tband and sidelying hip abduction to HEP   Person(s) Educated Patient   Methods Explanation   Comprehension Verbalized understanding          PT Short Term Goals - 03/31/15 1155    PT SHORT TERM GOAL #1   Title Pt will be independent with HEP.   Time 2   Period Weeks   Status Achieved   PT SHORT TERM GOAL #2   Title Pt will demonstrate increased BLE strength by 5 time sit to stand time of 21 seconds.    Time 2   Period Weeks   Status Achieved   PT SHORT TERM GOAL #3   Title Pt will ascend/descend 6 steps without increased pain in knee or low back.    Time 2   Period Weeks   Status Achieved   PT SHORT TERM GOAL #4   Title Pt will report <3/10 pain when getting out of a car or when standing for extended period of time.    Time 2   Period Weeks   Status On-going           PT Long Term Goals - 03/31/15 1155    PT LONG TERM GOAL #1   Title Pt will be independent in advanced HEP.    Time 4   Period Weeks   Status On-going   PT LONG TERM GOAL #2   Title Pt will demonstrate increased BLE strength by 5 time sit to stand time of 17 seconds.    Time 4   Period Weeks   Status Achieved   PT LONG TERM GOAL #3   Title Pt will ascend/descend 12 stairs without increased low back or knee pain.    Time 4   Period Weeks   Status On-going   PT LONG TERM GOAL #4   Title Pt will report <1/10 pain when getting in and out of car and when standing for extended periods of time.    Time 4   Period Weeks   Status On-going   PT LONG TERM  GOAL #5   Title Pt will tolerate wearing 1 inch heel lift in L shoe for a full day without pain to correct LLD and improve gait mechanics.    Status Deferred               Plan - 03/31/15 1153    Clinical Impression Statement Reassessment completed today. Pt has demonstrated progress in lumbar ROM, BLE strength, functional activity tolerance, and functional mobility. She will benefit from attending her next 3 scheduled appointments in order to further progress BLE strengthening and core stabilization exercises to advance her HEP.    PT Frequency 2x / week   PT Duration 2 weeks   PT Next Visit Plan Continue with BLE strengthening, add body mechanics training during lifting        Problem List Patient Active Problem List   Diagnosis Date Noted  . Unspecified constipation 05/21/2014  . FH: colon cancer 05/21/2014  . Breast cancer of upper-outer quadrant of right female breast 06/19/2013    Hilma Favors, PT, DPT [phone] 03/31/2015, 11:57 AM  [place] [address] [place], [place], [address] Phone: [phone]   Fax:  [phone]

## 2015-04-02 ENCOUNTER — Ambulatory Visit (HOSPITAL_COMMUNITY): Payer: 59

## 2015-04-02 DIAGNOSIS — M545 Low back pain: Secondary | ICD-10-CM

## 2015-04-02 DIAGNOSIS — R262 Difficulty in walking, not elsewhere classified: Secondary | ICD-10-CM

## 2015-04-02 DIAGNOSIS — M25562 Pain in left knee: Secondary | ICD-10-CM

## 2015-04-02 DIAGNOSIS — R6889 Other general symptoms and signs: Secondary | ICD-10-CM

## 2015-04-02 NOTE — Therapy (Signed)
[place], [place], [address] Phone: [phone]   Fax:  [phone]  Physical Therapy Treatment  Patient Details  Name: Morgan Santos MRN: [id number] Date of Birth: 05/18/1951 Referring Provider:  Nicholas Lose, MD  Encounter Date: 04/02/2015      PT End of Session - 04/02/15 1135    Visit Number 8   Number of Visits 10   Date for PT Re-Evaluation 04/24/15   Authorization Type [employer]   Authorization - Visit Number 8   Authorization - Number of Visits 10   PT Start Time 1112   PT Stop Time 1150   PT Time Calculation (min) 38 min   Activity Tolerance Patient tolerated treatment well   Behavior During Therapy [facility] for tasks assessed/performed      Past Medical History  Diagnosis Date  . Breast cancer   . Anxiety   . GERD (gastroesophageal reflux disease)     pt. uses vinegar for heartburn, ususally once per week   . Hypertension     "when I get upset"  Not on medication  . Stroke ?2010 or 2011    2010, [facility] for stroke "mini stroke" short term memory problems since  . Anemia     when she was younger  . Constipation   . Complication of anesthesia   . PONV (postoperative nausea and vomiting)     "just for one day"  . Wears glasses   . Wears dentures     top  . H/O degenerative disc disease   . Arthritis     hip- R, back; osteoarthritis  . Hyperlipidemia   . Depression     pt. remarks that she "takes respridal because if I don't take it I get angry"     Past Surgical History  Procedure Laterality Date  . Cholecystectomy    . Mastectomy w/ sentinel node biopsy Right 07/12/2013    Procedure: MASTECTOMY WITH SENTINEL LYMPH NODE BIOPSY;  Surgeon: Rolm Bookbinder, MD;  Location: [place];  Service: General;  Laterality: Right;  . Breast reconstruction with placement of tissue expander and flex hd (acellular hydrated dermis) Right 07/12/2013    Procedure: RIGHT BREAST RECONSTRUCTION WITH  PLACEMENT OF TISSUE EXPANDER AND FLEX HD TO RIGHT BREAST (ACELLULAR HYDRATED DERMIS);  Surgeon: Irene Limbo, MD;  Location: [place];  Service: Plastics;  Laterality: Right;  . Vaginal hysterectomy    . Colonoscopy   01/29/2004    NUR:A 7-8 mm polyp snared from the hepatic flexure.  Another 3 mm polyp was     cold snared from the sigmoid colon.External hemorrhoids, possible source of recent rectal bleeding  . Mastectomy    . Tubal ligation    . Dilation and curettage of uterus    . Portacath placement N/A 09/16/2013    Procedure: INSERTION PORT-A-CATH;  Surgeon: Rolm Bookbinder, MD;  Location: [place];  Service: General;  Laterality: N/A;  . Removal of bilateral tissue expanders with placement of bilateral breast implants Right 01/14/2014    Procedure: REMOVAL OF RIGHT TISSUE EXPANDERS WITH PLACEMENT OF PERMANENT BREAST IMPLANT;  Surgeon: Irene Limbo, MD;  Location: [place];  Service: Plastics;  Laterality: Right;  . Mastopexy Left 01/14/2014    Procedure: LEFT BREAST MASTOPEXY FOR SYMMETRY ;  Surgeon: Irene Limbo, MD;  Location: [place];  Service: Plastics;  Laterality: Left;  . Port-a-cath removal Left 01/14/2014    Procedure: REMOVAL PORT-A-CATH;  Surgeon: Irene Limbo,  MD;  Location: [place];  Service: Plastics;  Laterality: Left;  . Colonoscopy N/A 06/05/2014    Procedure: COLONOSCOPY;  Surgeon: Danie Binder, MD;  [facility];  Service: Endoscopy;  Laterality: N/A;  1030 - moved to 10:45 - Ginger to notify pt   . Lumbar spine surgery    . Breast surgery      for abcesses- removed from both breasts, many yrs. ago    There were no vitals filed for this visit.  Visit Diagnosis:  Knee pain, left  Bilateral low back pain, with sciatica presence unspecified  Difficulty walking  Decreased functional activity tolerance      Subjective Assessment - 04/02/15 1115    Subjective Pt stated she is feeling good  today, biggest problem currently is getting out of bed and increased pain initially under butt cheeks.  Pain usually resolved after she gets up and moving    Currently in Pain? No/denies                         [facility] Adult PT Treatment/Exercise - 04/02/15 0001    Lumbar Exercises: Stretches   Active Hamstring Stretch 3 reps;30 seconds   Active Hamstring Stretch Limitations 14" step   Single Knee to Chest Stretch 10 seconds;5 reps   Piriformis Stretch 3 reps;30 seconds   Lumbar Exercises: Standing   Lifting From 12";10 reps   Lifting Weights (lbs) yellow ball   Lifting Limitations 2 sets from 14in then 12 in step   Forward Lunge 15 reps   Forward Lunge Limitations 4in step   Other Standing Lumbar Exercises sidestepping with green tband x 3RT   Lumbar Exercises: Supine   Bent Knee Raise 10 reps;5 seconds   Bent Knee Raise Limitations Dead bug 5" holds cueing for stability   Lumbar Exercises: Sidelying   Hip Abduction 15 reps   Lumbar Exercises: Prone   Straight Leg Raise 15 reps   Knee/Hip Exercises: Stretches   Quad Stretch Both;30 seconds   Gastroc Stretch 3 reps;30 seconds   Gastroc Stretch Limitations slant board                  PT Short Term Goals - 03/31/15 1155    PT SHORT TERM GOAL #1   Title Pt will be independent with HEP.   Time 2   Period Weeks   Status Achieved   PT SHORT TERM GOAL #2   Title Pt will demonstrate increased BLE strength by 5 time sit to stand time of 21 seconds.    Time 2   Period Weeks   Status Achieved   PT SHORT TERM GOAL #3   Title Pt will ascend/descend 6 steps without increased pain in knee or low back.    Time 2   Period Weeks   Status Achieved   PT SHORT TERM GOAL #4   Title Pt will report <3/10 pain when getting out of a car or when standing for extended period of time.    Time 2   Period Weeks   Status On-going           PT Long Term Goals - 03/31/15 1155    PT LONG TERM GOAL #1   Title Pt will  be independent in advanced HEP.    Time 4   Period Weeks   Status On-going   PT LONG TERM GOAL #2   Title Pt will demonstrate increased BLE strength  by 5 time sit to stand time of 17 seconds.    Time 4   Period Weeks   Status Achieved   PT LONG TERM GOAL #3   Title Pt will ascend/descend 12 stairs without increased low back or knee pain.    Time 4   Period Weeks   Status On-going   PT LONG TERM GOAL #4   Title Pt will report <1/10 pain when getting in and out of car and when standing for extended periods of time.    Time 4   Period Weeks   Status On-going   PT LONG TERM GOAL #5   Title Pt will tolerate wearing 1 inch heel lift in L shoe for a full day without pain to correct LLD and improve gait mechanics.    Status Deferred               Plan - 04/02/15 1136    Clinical Impression Statement Pt educated on proper body mechanics with lifting with min cueing required for weight loading with squats, pt able to demonstrate appropriate body mechanics lifting from 14in and 12in height following cues.  Session focus on improving core and functional BLE strenghtening and stretches to improve lumbar ROM.  Pt able to demonstrate all exercises with no reports of pain, cueing for core stabiltiy with exercises.   PT Next Visit Plan Continue wtih BLE and core strengtheing, body mechanics for lifting from lower height next session.        Problem List Patient Active Problem List   Diagnosis Date Noted  . Unspecified constipation 05/21/2014  . FH: colon cancer 05/21/2014  . Breast cancer of upper-outer quadrant of right female breast 06/19/2013   Ihor Austin, [place]; [place]  Aldona Lento 04/02/2015, 11:50 AM  [place], [place], [address] Phone: [phone]   Fax:  [phone]

## 2015-04-07 ENCOUNTER — Ambulatory Visit (HOSPITAL_COMMUNITY): Payer: 59 | Admitting: Physical Therapy

## 2015-04-07 DIAGNOSIS — M545 Low back pain: Secondary | ICD-10-CM

## 2015-04-07 DIAGNOSIS — M25562 Pain in left knee: Secondary | ICD-10-CM

## 2015-04-07 DIAGNOSIS — R6889 Other general symptoms and signs: Secondary | ICD-10-CM

## 2015-04-07 DIAGNOSIS — R262 Difficulty in walking, not elsewhere classified: Secondary | ICD-10-CM

## 2015-04-07 NOTE — Therapy (Signed)
[place], [place], [address] Phone: [phone]   Fax:  [phone]  Physical Therapy Treatment  Patient Details  Name: Morgan Santos MRN: [id number] Date of Birth: 1950/11/28 Referring Provider:  Nicholas Lose, MD  Encounter Date: 04/07/2015      PT End of Session - 04/07/15 1221    Visit Number 9   Number of Visits 10   Date for PT Re-Evaluation 04/24/15   Authorization Type [employer] medicare   Authorization - Visit Number 9   Authorization - Number of Visits 10   PT Start Time 3338   PT Stop Time 1150   PT Time Calculation (min) 45 min   Activity Tolerance Patient tolerated treatment well   Behavior During Therapy [facility] for tasks assessed/performed      Past Medical History  Diagnosis Date  . Breast cancer   . Anxiety   . GERD (gastroesophageal reflux disease)     pt. uses vinegar for heartburn, ususally once per week   . Hypertension     "when I get upset"  Not on medication  . Stroke ?2010 or 2011    2010, [facility] for stroke "mini stroke" short term memory problems since  . Anemia     when she was younger  . Constipation   . Complication of anesthesia   . PONV (postoperative nausea and vomiting)     "just for one day"  . Wears glasses   . Wears dentures     top  . H/O degenerative disc disease   . Arthritis     hip- R, back; osteoarthritis  . Hyperlipidemia   . Depression     pt. remarks that she "takes respridal because if I don't take it I get angry"     Past Surgical History  Procedure Laterality Date  . Cholecystectomy    . Mastectomy w/ sentinel node biopsy Right 07/12/2013    Procedure: MASTECTOMY WITH SENTINEL LYMPH NODE BIOPSY;  Surgeon: Rolm Bookbinder, MD;  Location: [place];  Service: General;  Laterality: Right;  . Breast reconstruction with placement of tissue expander and flex hd (acellular hydrated dermis) Right 07/12/2013    Procedure: RIGHT BREAST RECONSTRUCTION  WITH PLACEMENT OF TISSUE EXPANDER AND FLEX HD TO RIGHT BREAST (ACELLULAR HYDRATED DERMIS);  Surgeon: Irene Limbo, MD;  Location: [place];  Service: Plastics;  Laterality: Right;  . Vaginal hysterectomy    . Colonoscopy   01/29/2004    NUR:A 7-8 mm polyp snared from the hepatic flexure.  Another 3 mm polyp was     cold snared from the sigmoid colon.External hemorrhoids, possible source of recent rectal bleeding  . Mastectomy    . Tubal ligation    . Dilation and curettage of uterus    . Portacath placement N/A 09/16/2013    Procedure: INSERTION PORT-A-CATH;  Surgeon: Rolm Bookbinder, MD;  Location: [place];  Service: General;  Laterality: N/A;  . Removal of bilateral tissue expanders with placement of bilateral breast implants Right 01/14/2014    Procedure: REMOVAL OF RIGHT TISSUE EXPANDERS WITH PLACEMENT OF PERMANENT BREAST IMPLANT;  Surgeon: Irene Limbo, MD;  Location: [place];  Service: Plastics;  Laterality: Right;  . Mastopexy Left 01/14/2014    Procedure: LEFT BREAST MASTOPEXY FOR SYMMETRY ;  Surgeon: Irene Limbo, MD;  Location: [place];  Service: Plastics;  Laterality: Left;  . Port-a-cath removal Left 01/14/2014    Procedure: REMOVAL PORT-A-CATH;  Surgeon: Arnoldo Hooker  Iran Planas, MD;  Location: [place];  Service: Plastics;  Laterality: Left;  . Colonoscopy N/A 06/05/2014    Procedure: COLONOSCOPY;  Surgeon: Danie Binder, MD;  [facility];  Service: Endoscopy;  Laterality: N/A;  1030 - moved to 10:45 - Morgan Santos to notify pt   . Lumbar spine surgery    . Breast surgery      for abcesses- removed from both breasts, many yrs. ago    There were no vitals filed for this visit.  Visit Diagnosis:  Knee pain, left  Difficulty walking  Decreased functional activity tolerance  Bilateral low back pain, with sciatica presence unspecified      Subjective Assessment - 04/07/15 1224    Subjective Pt states she has no pain  and has been compliant with HEP.   Currently in Pain? No/denies                         [facility] Adult PT Treatment/Exercise - 04/07/15 1107    Lumbar Exercises: Stretches   Active Hamstring Stretch 3 reps;30 seconds   Active Hamstring Stretch Limitations 14" step   ITB Stretch 3 reps;30 seconds   ITB Stretch Limitations gastroc stretch with slant board   Piriformis Stretch 3 reps;30 seconds   Piriformis Stretch Limitations seated   Lumbar Exercises: Aerobic   Stationary Bike nustep 10 minutes level 3 hills #3 LE only   Lumbar Exercises: Standing   Lifting From 12";10 reps   Lifting Weights (lbs) yellow ball to heelraise and lift OH   Forward Lunge 15 reps   Forward Lunge Limitations no UE's, 4in step   Other Standing Lumbar Exercises hip hike at 4" step x 15 bilaterally.  lateral step ups 10reps 4" each   Other Standing Lumbar Exercises sidestepping with green tband x 3RT   Knee/Hip Exercises: Stretches   Gastroc Stretch 3 reps;30 seconds   Gastroc Stretch Limitations slant board                  PT Short Term Goals - 03/31/15 1155    PT SHORT TERM GOAL #1   Title Pt will be independent with HEP.   Time 2   Period Weeks   Status Achieved   PT SHORT TERM GOAL #2   Title Pt will demonstrate increased BLE strength by 5 time sit to stand time of 21 seconds.    Time 2   Period Weeks   Status Achieved   PT SHORT TERM GOAL #3   Title Pt will ascend/descend 6 steps without increased pain in knee or low back.    Time 2   Period Weeks   Status Achieved   PT SHORT TERM GOAL #4   Title Pt will report <3/10 pain when getting out of a car or when standing for extended period of time.    Time 2   Period Weeks   Status On-going           PT Long Term Goals - 03/31/15 1155    PT LONG TERM GOAL #1   Title Pt will be independent in advanced HEP.    Time 4   Period Weeks   Status On-going   PT LONG TERM GOAL #2   Title Pt will demonstrate increased  BLE strength by 5 time sit to stand time of 17 seconds.    Time 4   Period Weeks   Status Achieved   PT LONG TERM  GOAL #3   Title Pt will ascend/descend 12 stairs without increased low back or knee pain.    Time 4   Period Weeks   Status On-going   PT LONG TERM GOAL #4   Title Pt will report <1/10 pain when getting in and out of car and when standing for extended periods of time.    Time 4   Period Weeks   Status On-going   PT LONG TERM GOAL #5   Title Pt will tolerate wearing 1 inch heel lift in L shoe for a full day without pain to correct LLD and improve gait mechanics.    Status Deferred               Plan - 04/07/15 1208    Clinical Impression Statement Pt presented with good body mechanics today; added heelraise to ball squat activity with min assist from therapist to maintain stability.  Completed lunges today wtihout UE assist with good form and control.  Added nustep at end of session to work on improving activity tolerance and LE stregnth.    PT Next Visit Plan Continue wtih BLE and core strengthening. Re-eval and update gcode next session        Problem List Patient Active Problem List   Diagnosis Date Noted  . Unspecified constipation 05/21/2014  . FH: colon cancer 05/21/2014  . Breast cancer of upper-outer quadrant of right female breast 06/19/2013    Teena Irani, PTA/CLT [phone]  04/07/2015, 12:25 PM  [place], [place], [address] Phone: [phone]   Fax:  [phone]

## 2015-04-09 ENCOUNTER — Ambulatory Visit (HOSPITAL_COMMUNITY): Payer: 59 | Admitting: Physical Therapy

## 2015-04-09 DIAGNOSIS — R6889 Other general symptoms and signs: Secondary | ICD-10-CM

## 2015-04-09 DIAGNOSIS — M25562 Pain in left knee: Secondary | ICD-10-CM

## 2015-04-09 DIAGNOSIS — R262 Difficulty in walking, not elsewhere classified: Secondary | ICD-10-CM

## 2015-04-09 DIAGNOSIS — M545 Low back pain: Secondary | ICD-10-CM

## 2015-04-09 NOTE — Therapy (Signed)
[place], [place], [address] Phone: [phone]   Fax:  [phone]  Physical Therapy Treatment  Patient Details  Name: Morgan Santos MRN: [id number] Date of Birth: 04-12-1951 Referring Provider:  Nicholas Lose, MD  Encounter Date: 04/09/2015      PT End of Session - 04/09/15 1203    Visit Number 10   Number of Visits 10   Authorization Type [employer] medicare   Authorization - Visit Number 10   Authorization - Number of Visits 10   PT Start Time 1100   PT Stop Time 1139   PT Time Calculation (min) 39 min   Activity Tolerance Patient tolerated treatment well   Behavior During Therapy [facility] for tasks assessed/performed      Past Medical History  Diagnosis Date  . Breast cancer   . Anxiety   . GERD (gastroesophageal reflux disease)     pt. uses vinegar for heartburn, ususally once per week   . Hypertension     "when I get upset"  Not on medication  . Stroke ?2010 or 2011    2010, [facility] for stroke "mini stroke" short term memory problems since  . Anemia     when she was younger  . Constipation   . Complication of anesthesia   . PONV (postoperative nausea and vomiting)     "just for one day"  . Wears glasses   . Wears dentures     top  . H/O degenerative disc disease   . Arthritis     hip- R, back; osteoarthritis  . Hyperlipidemia   . Depression     pt. remarks that she "takes respridal because if I don't take it I get angry"     Past Surgical History  Procedure Laterality Date  . Cholecystectomy    . Mastectomy w/ sentinel node biopsy Right 07/12/2013    Procedure: MASTECTOMY WITH SENTINEL LYMPH NODE BIOPSY;  Surgeon: Rolm Bookbinder, MD;  Location: [place];  Service: General;  Laterality: Right;  . Breast reconstruction with placement of tissue expander and flex hd (acellular hydrated dermis) Right 07/12/2013    Procedure: RIGHT BREAST RECONSTRUCTION WITH PLACEMENT OF TISSUE EXPANDER AND  FLEX HD TO RIGHT BREAST (ACELLULAR HYDRATED DERMIS);  Surgeon: Irene Limbo, MD;  Location: [place];  Service: Plastics;  Laterality: Right;  . Vaginal hysterectomy    . Colonoscopy   01/29/2004    NUR:A 7-8 mm polyp snared from the hepatic flexure.  Another 3 mm polyp was     cold snared from the sigmoid colon.External hemorrhoids, possible source of recent rectal bleeding  . Mastectomy    . Tubal ligation    . Dilation and curettage of uterus    . Portacath placement N/A 09/16/2013    Procedure: INSERTION PORT-A-CATH;  Surgeon: Rolm Bookbinder, MD;  Location: [place];  Service: General;  Laterality: N/A;  . Removal of bilateral tissue expanders with placement of bilateral breast implants Right 01/14/2014    Procedure: REMOVAL OF RIGHT TISSUE EXPANDERS WITH PLACEMENT OF PERMANENT BREAST IMPLANT;  Surgeon: Irene Limbo, MD;  Location: [place];  Service: Plastics;  Laterality: Right;  . Mastopexy Left 01/14/2014    Procedure: LEFT BREAST MASTOPEXY FOR SYMMETRY ;  Surgeon: Irene Limbo, MD;  Location: [place];  Service: Plastics;  Laterality: Left;  . Port-a-cath removal Left 01/14/2014    Procedure: REMOVAL PORT-A-CATH;  Surgeon: Irene Limbo, MD;  Location: [facility];  Service: Clinical cytogeneticist;  Laterality: Left;  . Colonoscopy N/A 06/05/2014    Procedure: COLONOSCOPY;  Surgeon: Danie Binder, MD;  [facility];  Service: Endoscopy;  Laterality: N/A;  1030 - moved to 10:45 - G[place] to notify pt   . Lumbar spine surgery    . Breast surgery      for abcesses- removed from both breasts, many yrs. ago    There were no vitals filed for this visit.  Visit Diagnosis:  Knee pain, left  Difficulty walking  Decreased functional activity tolerance  Bilateral low back pain, with sciatica presence unspecified      Subjective Assessment - 04/09/15 1105    Subjective Pt reports that she does not have any pain today. She has been  compliant with HEP, focusing on the stretches more than anything. She has noticed improvments in her walking, functional mobility, and pain levels. Pt reports that the only thing she is having difficulty with is piriformis stretch.  She is no longer experiencing any numbness when walking for an extended period of time.    How long can you sit comfortably? no limitations   How long can you stand comfortably? 30 minutes or more   How long can you walk comfortably? 30 minutes or more   Currently in Pain? No/denies   Pain Score 0-No pain            [facility] PT Assessment - 04/09/15 0001    Observation/Other Assessments   Focus on Therapeutic Outcomes (FOTO)  34% limited   Strength   Right Hip Flexion 4/5   Right Hip Extension 4-/5   Right Hip ABduction 4-/5   Left Hip Flexion 4/5   Left Hip Extension 4-/5   Left Hip ABduction 4-/5   Right Knee Flexion 4+/5   Right Knee Extension 5/5   Left Knee Flexion 4+/5   Left Knee Extension 5/5                     [facility] Adult PT Treatment/Exercise - 04/09/15 0001    Lumbar Exercises: Stretches   Active Hamstring Stretch 3 reps;30 seconds   Active Hamstring Stretch Limitations 14" step   Piriformis Stretch 3 reps;30 seconds   Piriformis Stretch Limitations seated   Lumbar Exercises: Standing   Functional Squats 10 reps   Functional Squats Limitations infront of chair, cueing for weight loading   Forward Lunge 15 reps   Forward Lunge Limitations no UE's, 4in step   Other Standing Lumbar Exercises hip hike at 4" step x 15 bilaterally.  lateral step ups 10reps 4" each   Other Standing Lumbar Exercises sidestepping with green tband x 3RT                PT Education - 04/09/15 1202    Education provided Yes   Education Details Copy of updated HEP given   Person(s) Educated Patient   Methods Explanation;Handout   Comprehension Verbalized understanding;Returned demonstration          PT Short Term Goals - 04/09/15  1108    PT SHORT TERM GOAL #1   Title Pt will be independent with HEP.   Time 2   Period Weeks   Status Achieved   PT SHORT TERM GOAL #2   Title Pt will demonstrate increased BLE strength by 5 time sit to stand time of 21 seconds.    Time 2   Period Weeks   Status Achieved   PT SHORT  TERM GOAL #3   Title Pt will ascend/descend 6 steps without increased pain in knee or low back.    Time 2   Period Weeks   Status Achieved           PT Long Term Goals - 04/30/2015 1108    PT LONG TERM GOAL #1   Title Pt will be independent in advanced HEP.    Time 4   Period Weeks   Status Achieved   PT LONG TERM GOAL #2   Title Pt will demonstrate increased BLE strength by 5 time sit to stand time of 17 seconds.    Time 4   Period Weeks   Status Achieved   PT LONG TERM GOAL #3   Title Pt will ascend/descend 12 stairs without increased low back or knee pain.    Time 4   Period Weeks   Status Achieved   PT LONG TERM GOAL #4   Title Pt will report <1/10 pain when getting in and out of car and when standing for extended periods of time.    Time 4   Period Weeks   Status Achieved               Plan - April 30, 2015 1203    Clinical Impression Statement Pt has demonstrated excellent progress in physical therapy. She has met her LTGs, demonstrates improved BLE strength, is able to stand and walk for longer periods of time without increased pain, and has been compliant with HEP. Pt reports that she is very happy with the progress that she has made in PT, and she is being d/c to continue with HEP.    Consulted and Agree with Plan of Care Patient          G-Codes - 30-Apr-2015 1205    Functional Assessment Tool Used FOTO   Functional Limitation Mobility: Walking and moving around   Mobility: Walking and Moving Around Goal Status [phone]) At least 40 percent but less than 60 percent impaired, limited or restricted   Mobility: Walking and Moving Around Discharge Status [phone]) At least 20  percent but less than 40 percent impaired, limited or restricted      Problem List Patient Active Problem List   Diagnosis Date Noted  . Unspecified constipation 05/21/2014  . FH: colon cancer 05/21/2014  . Breast cancer of upper-outer quadrant of right female breast 06/19/2013    PHYSICAL THERAPY DISCHARGE SUMMARY  Visits from Start of Care: 10  Current functional level related to goals / functional outcomes: Pt has met all LTGs, demonstrating improved strength, functional activity tolerance, functional mobility, and decreased pain.   Remaining deficits: Pt continues to have some L knee pain when ambulating greater than 45 minutes.    Education / Equipment: HEP Plan: Patient agrees to discharge.  Patient goals were met. Patient is being discharged due to meeting the stated rehab goals.  ?????       Hilma Favors, PT, DPT [phone] 2015-04-30, 12:07 PM  [facility] [address] [place], [place], [address] Phone: [phone]   Fax:  [phone]

## 2015-05-19 ENCOUNTER — Other Ambulatory Visit: Payer: Self-pay

## 2015-05-19 DIAGNOSIS — Z1231 Encounter for screening mammogram for malignant neoplasm of breast: Secondary | ICD-10-CM

## 2015-05-29 ENCOUNTER — Ambulatory Visit
Admission: RE | Admit: 2015-05-29 | Discharge: 2015-05-29 | Disposition: A | Discharge status: Home / Self Care | Payer: 59 | Source: Ambulatory Visit

## 2015-05-29 DIAGNOSIS — Z1231 Encounter for screening mammogram for malignant neoplasm of breast: Secondary | ICD-10-CM

## 2015-06-10 NOTE — Assessment & Plan Note (Signed)
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 ER 99% PR 99% HER-2 negative: Intermediate risk Oncotype DX recurrence score 23. Patient received 4 cycles of Taxotere Cytoxan chemotherapy. She had breast reconstruction subsequently in May 2015, started Arimidex May 2015  Surveillance:  1. Breast exam 06/11/2015 did not reveal any lumps or nodules 2. Mammogram 06/01/2015 is normal density category B 3. Bone density April 2015 showed T score -0.6  Arimidex toxicities: 1. Hot flashes but not severe 2. Fatigue  Survivorship:Discussed the importance of physical exercise in decreasing the likelihood of breast cancer recurrence. Recommended 30 mins daily 6 days a week of either brisk walking or cycling or swimming. Encouraged patient to eat more fruits and vegetables and decrease red meat.    Low back pain: Patient has seen a chiropractor. I discussed with her about evaluating her back with an MRI. She would like to watch it and treated conservatively. We will refer her to physical therapy at [facility].  Return to clinic in 6 months for follow-up

## 2015-06-11 ENCOUNTER — Ambulatory Visit (HOSPITAL_BASED_OUTPATIENT_CLINIC_OR_DEPARTMENT_OTHER): Payer: 59 | Admitting: Hematology and Oncology

## 2015-06-11 ENCOUNTER — Telehealth: Payer: Self-pay | Admitting: Hematology and Oncology

## 2015-06-11 ENCOUNTER — Encounter: Payer: Self-pay | Admitting: Hematology and Oncology

## 2015-06-11 VITALS — BP 137/74 | HR 60 | Temp 97.8°F | Resp 18 | Ht 69.2 in | Wt 228.3 lb

## 2015-06-11 DIAGNOSIS — M79604 Pain in right leg: Secondary | ICD-10-CM | POA: Diagnosis not present

## 2015-06-11 DIAGNOSIS — N951 Menopausal and female climacteric states: Secondary | ICD-10-CM | POA: Diagnosis not present

## 2015-06-11 DIAGNOSIS — M545 Low back pain: Secondary | ICD-10-CM | POA: Diagnosis not present

## 2015-06-11 DIAGNOSIS — Z170 Estrogen receptor positive status [ER+]: Secondary | ICD-10-CM

## 2015-06-11 DIAGNOSIS — R5383 Other fatigue: Secondary | ICD-10-CM

## 2015-06-11 DIAGNOSIS — C50411 Malignant neoplasm of upper-outer quadrant of right female breast: Secondary | ICD-10-CM | POA: Diagnosis not present

## 2015-06-11 NOTE — Telephone Encounter (Signed)
Gave patient avs report and appointments for April 2017.  °

## 2015-06-11 NOTE — Progress Notes (Signed)
Patient Care Team: [name], PA-C as PCP - General ([place]) Amada Kingfisher, MD as Consulting Physician (Hematology and Oncology) Danie Binder, MD as Consulting Physician (Gastroenterology)  DIAGNOSIS: Breast cancer of upper-outer quadrant of right female breast [facility])   Staging form: Breast, AJCC 7th Edition     Clinical: Stage IA (T1c, N0, cM0) - Unsigned       Staging comments: Staged at breast conference 10.29.14      Pathologic: Stage IA (T1c, N0, cM0) - Signed by Rulon Eisenmenger, MD on 04/29/2014   SUMMARY OF ONCOLOGIC HISTORY:   Breast cancer of upper-outer quadrant of right female breast ([place])   04/29/2013 Mammogram Right breast mass, 7 mm lesion at 9:00 position, IDC with DCIS grade 2 ER  PR 99% positive HER-2 negative Ki-67 11%: Second mass IDC ER/PR positive HER-2 negative Ki-67 11%, MRI 6 cm including enhancement    07/12/2013 Surgery Right breast mastectomy: IDC grade 1, 2 foci 1.2 and 1.1 cm, LVI positive, 2 SLN negative, Oncotype DX score 23 intermediate risk, ROR. 14%   09/26/2013 - 11/28/2013 Chemotherapy Adjuvant Taxotere and Cytoxan x4 cycles   01/14/2014 Surgery Right breast reconstruction and right port removal   01/17/2014 -  Anti-estrogen oral therapy anastrozole 1 mg daily    CHIEF COMPLIANT: follow-up on anastrozole  INTERVAL HISTORY: Morgan Santos is a 64 year old with above-mentioned history of right-sided breast cancer treated with mastectomy followed by adjuvant chemotherapy and reconstruction and is now on anastrozole from May 2015. She appears to be tolerating it fairly well. She does have hot flashes which are accompanied by heavy sweating. She reports that she is able to manage it reasonably well. She does not want to take any other medications. She also complains of low back pain especially in the right hip that radiates down the leg.denies any lumps or nodules in breast. Recent mammogram on the left breast is normal.  REVIEW OF SYSTEMS:    Constitutional: Denies fevers, chills or abnormal weight loss Eyes: Denies blurriness of vision Ears, nose, mouth, throat, and face: Denies mucositis or sore throat Respiratory: Denies cough, dyspnea or wheezes Cardiovascular: Denies palpitation, chest discomfort or lower extremity swelling Gastrointestinal:  Denies nausea, heartburn or change in bowel habits Skin: Denies abnormal skin rashes Lymphatics: Denies new lymphadenopathy or easy bruising Neurological:Denies numbness, tingling or new weaknesses Behavioral/Psych: Mood is stable, no new changes  Breast:  denies any pain or lumps or nodules in either breasts All other systems were reviewed with the patient and are negative.  I have reviewed the past medical history, past surgical history, social history and family history with the patient and they are unchanged from previous note.  ALLERGIES:  is allergic to codeine; crestor; lipitor; and multihance.  MEDICATIONS:  Current Outpatient Prescriptions  Medication Sig Dispense Refill  . ALPRAZolam (XANAX) 0.5 MG tablet Take 0.5 mg by mouth 2 (two) times daily as needed for anxiety.     Marland Kitchen anastrozole (ARIMIDEX) 1 MG tablet TAKE ONE TABLET BY MOUTH ONCE DAILY 90 tablet 3  . Calcium-Magnesium-Zinc 500-250-12.5 MG TABS Take by mouth 2 (two) times daily.    . Cholecalciferol (VITAMIN D-3) 5000 UNITS TABS Take 2 each by mouth daily.    . citalopram (CELEXA) 20 MG tablet Take 20 mg by mouth daily.    Marland Kitchen gabapentin (NEURONTIN) 100 MG capsule Take 100 mg by mouth 3 (three) times daily.    . meloxicam (MOBIC) 7.5 MG tablet Take 7.5 mg by mouth daily.    Marland Kitchen  Omega-3 Fatty Acids (FISH OIL PO) Take 1 capsule by mouth 2 (two) times daily.     Marland Kitchen omeprazole (PRILOSEC) 20 MG capsule Take 20 mg by mouth daily.     . simvastatin (ZOCOR) 20 MG tablet Take 20 mg by mouth daily.     No current facility-administered medications for this visit.    PHYSICAL EXAMINATION: ECOG PERFORMANCE STATUS: 1 -  Symptomatic but completely ambulatory  Filed Vitals:   06/11/15 1405  BP: 137/74  Pulse: 60  Temp: 97.8 F (36.6 C)  Resp: 18   Filed Weights   06/11/15 1405  Weight: 228 lb 4.8 oz (103.556 kg)    GENERAL:alert, no distress and comfortable SKIN: skin color, texture, turgor are normal, no rashes or significant lesions EYES: normal, Conjunctiva are pink and non-injected, sclera clear OROPHARYNX:no exudate, no erythema and lips, buccal mucosa, and tongue normal  NECK: supple, thyroid normal size, non-tender, without nodularity LYMPH:  no palpable lymphadenopathy in the cervical, axillary or inguinal LUNGS: clear to auscultation and percussion with normal breathing effort HEART: regular rate & rhythm and no murmurs and no lower extremity edema ABDOMEN:abdomen soft, non-tender and normal bowel sounds Musculoskeletal:no cyanosis of digits and no clubbing  NEURO: alert & oriented x 3 with fluent speech, no focal motor/sensory deficits BREAST: no palpable lumps or nodules in the left breast. No palpable adenopathy in axilla. Right reconstructed breast appears to be normal without any palpable nodularity. (exam performed in the presence of a chaperone)  LABORATORY DATA:  I have reviewed the data as listed   Chemistry      Component Value Date/Time   NA 141 12/09/2014 1317   NA 135 04/01/2014 1236   K 4.1 12/09/2014 1317   K 3.9 04/01/2014 1236   CL 101 04/01/2014 1236   CO2 23 12/09/2014 1317   CO2 28 04/01/2014 1236   BUN 15.3 12/09/2014 1317   BUN 12 04/01/2014 1236   CREATININE 0.7 12/09/2014 1317   CREATININE 0.71 04/01/2014 1236      Component Value Date/Time   CALCIUM 9.2 12/09/2014 1317   CALCIUM 9.1 04/01/2014 1236   ALKPHOS 141 12/09/2014 1317   ALKPHOS 103 04/01/2014 1236   AST 26 12/09/2014 1317   AST 19 04/01/2014 1236   ALT 25 12/09/2014 1317   ALT 18 04/01/2014 1236   BILITOT 0.62 12/09/2014 1317   BILITOT 0.5 04/01/2014 1236       Lab Results   Component Value Date   WBC 5.3 12/09/2014   HGB 13.8 12/09/2014   HCT 41.6 12/09/2014   MCV 89.7 12/09/2014   PLT 189 12/09/2014   NEUTROABS 2.6 12/09/2014   ASSESSMENT & PLAN:  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 ER 99% PR 99% HER-2 negative: Intermediate risk Oncotype DX recurrence score 23. Patient received 4 cycles of Taxotere Cytoxan chemotherapy. She had breast reconstruction subsequently in May 2015, started Arimidex May 2015  Surveillance:  1. Breast exam 06/11/2015 did not reveal any lumps or nodules 2. Mammogram 06/01/2015 is normal density category B 3. Bone density April 2015 showed T score -0.6  Arimidex toxicities: 1. Hot flashes accompanied by sweating especially in hot weather 2. Fatigue Right leg and back pain: We discussed the role of bone scan but we elected to not pursue it because it certainly appears to be arthritis in nature.  Survivorship:Discussed the importance  of physical exercise in decreasing the likelihood of breast cancer recurrence. Recommended 30 mins daily 6 days a week of either brisk walking or cycling or swimming. Encouraged patient to eat more fruits and vegetables and decrease red meat.    Low back pain: Patient has seen a chiropractor. Return to clinic in 6 months for follow-up And after that we will see her once a year   No orders of the defined types were placed in this encounter.   The patient has a good understanding of the overall plan. she agrees with it. she will call with any problems that may develop before the next visit here.   Rulon Eisenmenger, MD 06/11/2015

## 2015-06-11 NOTE — Addendum Note (Signed)
Addended by: Prentiss Bells on: 06/11/2015 05:52 PM   Modules accepted: Medications

## 2015-08-13 ENCOUNTER — Ambulatory Visit (INDEPENDENT_AMBULATORY_CARE_PROVIDER_SITE_OTHER): Payer: 59 | Admitting: Orthopedic Surgery

## 2015-08-13 ENCOUNTER — Encounter: Payer: Self-pay | Admitting: Orthopedic Surgery

## 2015-08-13 ENCOUNTER — Ambulatory Visit (INDEPENDENT_AMBULATORY_CARE_PROVIDER_SITE_OTHER): Payer: 59

## 2015-08-13 DIAGNOSIS — M1710 Unilateral primary osteoarthritis, unspecified knee: Secondary | ICD-10-CM

## 2015-08-13 DIAGNOSIS — M129 Arthropathy, unspecified: Secondary | ICD-10-CM

## 2015-08-13 NOTE — Patient Instructions (Signed)
Continue Modic once a day  Follow-up with [place] in a year for x-rays. Call us if symptoms worsen prior to that.

## 2015-08-13 NOTE — Progress Notes (Signed)
Recheck left knee patient on Modic doing well does have some crepitance and difficulty with stairclimbing  She is otherwise functioning well  Review of systems is only positive for feeling of giving way but no frank giving way or mechanical episodes with the left knee  Prior history is as follows:  HPI Morgan Santos is a 64 y.o. female. Presents for evaluation of left knee pain when she goes up and down the stairs with crepitance and grinding stiffness which is worse with activity but has been relieved with recent onset of taking meloxicam for the last couple of weeks. Pain present for about 6 months  Review of systems she does have some hip pain bilateral buttock pain which radiates occasionally down both legs and she has some intermittent back pain  She listed her review of systems is otherwise negative including no bowel or bladder problems and no night sweats or weight loss  LEFT KNEE  Normal ambulation full range of motion minimal crepitance on range of motion no specific tenderness along the joint lines stability tests were normal motor exam is intact she had normal neurovascular function left knee left knee     Plane films today show no progression of disease mild arthritis is seen   Our recommendation is for continued Modic 1 today exercises and follow-up with [place] in a year

## 2015-09-17 DIAGNOSIS — F0390 Unspecified dementia without behavioral disturbance: Secondary | ICD-10-CM | POA: Insufficient documentation

## 2015-12-10 ENCOUNTER — Ambulatory Visit (HOSPITAL_BASED_OUTPATIENT_CLINIC_OR_DEPARTMENT_OTHER): Payer: 59 | Admitting: Hematology and Oncology

## 2015-12-10 ENCOUNTER — Encounter: Payer: Self-pay | Admitting: Hematology and Oncology

## 2015-12-10 ENCOUNTER — Telehealth: Payer: Self-pay | Admitting: Hematology and Oncology

## 2015-12-10 VITALS — BP 134/73 | HR 63 | Temp 98.0°F | Resp 18 | Wt 231.0 lb

## 2015-12-10 DIAGNOSIS — Z170 Estrogen receptor positive status [ER+]: Secondary | ICD-10-CM

## 2015-12-10 DIAGNOSIS — N951 Menopausal and female climacteric states: Secondary | ICD-10-CM

## 2015-12-10 DIAGNOSIS — C50411 Malignant neoplasm of upper-outer quadrant of right female breast: Secondary | ICD-10-CM | POA: Diagnosis not present

## 2015-12-10 DIAGNOSIS — M79604 Pain in right leg: Secondary | ICD-10-CM | POA: Diagnosis not present

## 2015-12-10 DIAGNOSIS — R5383 Other fatigue: Secondary | ICD-10-CM | POA: Diagnosis not present

## 2015-12-10 DIAGNOSIS — M545 Low back pain: Secondary | ICD-10-CM

## 2015-12-10 NOTE — Progress Notes (Signed)
Patient Care Team: [name], PA-C as PCP - General ([place]) Amada Kingfisher, MD as Consulting Physician (Hematology and Oncology) Danie Binder, MD as Consulting Physician (Gastroenterology)  DIAGNOSIS: Breast cancer of upper-outer quadrant of right female breast [facility])   Staging form: Breast, AJCC 7th Edition     Clinical: Stage IA (T1c, N0, cM0) - Unsigned       Staging comments: Staged at breast conference 10.29.14      Pathologic: Stage IA (T1c, N0, cM0) - Signed by Rulon Eisenmenger, MD on 04/29/2014   SUMMARY OF ONCOLOGIC HISTORY:   Breast cancer of upper-outer quadrant of right female breast ([place])   04/29/2013 Mammogram Right breast mass, 7 mm lesion at 9:00 position, IDC with DCIS grade 2 ER  PR 99% positive HER-2 negative Ki-67 11%: Second mass IDC ER/PR positive HER-2 negative Ki-67 11%, MRI 6 cm including enhancement    07/12/2013 Surgery Right breast mastectomy: IDC grade 1, 2 foci 1.2 and 1.1 cm, LVI positive, 2 SLN negative, Oncotype DX score 23 intermediate risk, ROR. 14%   09/26/2013 - 11/28/2013 Chemotherapy Adjuvant Taxotere and Cytoxan x4 cycles   01/14/2014 Surgery Right breast reconstruction and right port removal   01/17/2014 -  Anti-estrogen oral therapy anastrozole 1 mg daily    CHIEF COMPLIANT: Follow-up on anastrozole  INTERVAL HISTORY: Morgan Santos is a 65 year old with above-mentioned history right breast cancer currently on anastrozole therapy and she appears to be tolerating it fairly well. She is complaining of hot flashes that are manageable. She is complaining of low back pain and left hip pain. Denies any lumps or nodules in the breasts. She had a mammogram in October which was normal.  REVIEW OF SYSTEMS:   Constitutional: Denies fevers, chills or abnormal weight loss Eyes: Denies blurriness of vision Ears, nose, mouth, throat, and face: Denies mucositis or sore throat Respiratory: Denies cough, dyspnea or wheezes Cardiovascular: Denies  palpitation, chest discomfort Gastrointestinal:  Denies nausea, heartburn or change in bowel habits Skin: Denies abnormal skin rashes Lymphatics: Denies new lymphadenopathy or easy bruising Neurological:Denies numbness, tingling or new weaknesses Behavioral/Psych: Mood is stable, no new changes  Extremities: No lower extremity edema Breast:  denies any pain or lumps or nodules in either breasts All other systems were reviewed with the patient and are negative.  I have reviewed the past medical history, past surgical history, social history and family history with the patient and they are unchanged from previous note.  ALLERGIES:  is allergic to codeine; crestor; lipitor; and multihance.  MEDICATIONS:  Current Outpatient Prescriptions  Medication Sig Dispense Refill  . ALPRAZolam (XANAX) 0.5 MG tablet Take 0.5 mg by mouth 2 (two) times daily as needed for anxiety.     Marland Kitchen anastrozole (ARIMIDEX) 1 MG tablet TAKE ONE TABLET BY MOUTH ONCE DAILY 90 tablet 3  . Calcium-Magnesium-Zinc 500-250-12.5 MG TABS Take by mouth 2 (two) times daily.    . Cholecalciferol (VITAMIN D-3) 5000 UNITS TABS Take 2 each by mouth daily.    . citalopram (CELEXA) 20 MG tablet Take 20 mg by mouth daily.    Marland Kitchen gabapentin (NEURONTIN) 100 MG capsule Take 100 mg by mouth 3 (three) times daily.    . meloxicam (MOBIC) 7.5 MG tablet Take 7.5 mg by mouth daily.    . Omega-3 Fatty Acids (FISH OIL PO) Take 1 capsule by mouth 2 (two) times daily.     Marland Kitchen omeprazole (PRILOSEC) 20 MG capsule Take 20 mg by mouth daily.     Marland Kitchen  simvastatin (ZOCOR) 20 MG tablet Take 20 mg by mouth daily.     No current facility-administered medications for this visit.    PHYSICAL EXAMINATION: ECOG PERFORMANCE STATUS: 1 - Symptomatic but completely ambulatory  Filed Vitals:   12/10/15 1415  BP: 134/73  Pulse: 63  Temp: 98 F (36.7 C)  Resp: 18   Filed Weights   12/10/15 1415  Weight: 231 lb (104.781 kg)    GENERAL:alert, no distress and  comfortable SKIN: skin color, texture, turgor are normal, no rashes or significant lesions EYES: normal, Conjunctiva are pink and non-injected, sclera clear OROPHARYNX:no exudate, no erythema and lips, buccal mucosa, and tongue normal  NECK: supple, thyroid normal size, non-tender, without nodularity LYMPH:  no palpable lymphadenopathy in the cervical, axillary or inguinal LUNGS: clear to auscultation and percussion with normal breathing effort HEART: regular rate & rhythm and no murmurs and no lower extremity edema ABDOMEN:abdomen soft, non-tender and normal bowel sounds MUSCULOSKELETAL:no cyanosis of digits and no clubbing  NEURO: alert & oriented x 3 with fluent speech, no focal motor/sensory deficits EXTREMITIES: No lower extremity edema BREAST: No palpable lumps or nodules in the left breast right mastectomy chest wall is normal. White transfer. No palpable axillary supraclavicular or infraclavicular adenopathy no breast tenderness or nipple discharge. (exam performed in the presence of a chaperone)  LABORATORY DATA:  I have reviewed the data as listed   Chemistry      Component Value Date/Time   NA 141 12/09/2014 1317   NA 135 04/01/2014 1236   K 4.1 12/09/2014 1317   K 3.9 04/01/2014 1236   CL 101 04/01/2014 1236   CO2 23 12/09/2014 1317   CO2 28 04/01/2014 1236   BUN 15.3 12/09/2014 1317   BUN 12 04/01/2014 1236   CREATININE 0.7 12/09/2014 1317   CREATININE 0.71 04/01/2014 1236      Component Value Date/Time   CALCIUM 9.2 12/09/2014 1317   CALCIUM 9.1 04/01/2014 1236   ALKPHOS 141 12/09/2014 1317   ALKPHOS 103 04/01/2014 1236   AST 26 12/09/2014 1317   AST 19 04/01/2014 1236   ALT 25 12/09/2014 1317   ALT 18 04/01/2014 1236   BILITOT 0.62 12/09/2014 1317   BILITOT 0.5 04/01/2014 1236       Lab Results  Component Value Date   WBC 5.3 12/09/2014   HGB 13.8 12/09/2014   HCT 41.6 12/09/2014   MCV 89.7 12/09/2014   PLT 189 12/09/2014   NEUTROABS 2.6 12/09/2014     ASSESSMENT & PLAN:  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 ER 99% PR 99% HER-2 negative: Intermediate risk Oncotype DX recurrence score 23. Patient received 4 cycles of Taxotere Cytoxan chemotherapy. She had breast reconstruction subsequently in May 2015, started Arimidex May 2015  Surveillance:  1. Breast exam 12/10/2015 did not reveal any lumps or nodules 2. Mammogram 06/01/2015 is normal density category B 3. Bone density April 2015 showed T score -0.6  Arimidex toxicities: 1. Hot flashes accompanied by sweating especially in hot weather 2. Fatigue Right leg and back pain: Could be arthritis versus bursitis  Survivorship:Discussed the importance of physical exercise in decreasing the likelihood of breast cancer recurrence. Recommended 30 mins daily 6 days a week of either brisk walking or cycling or swimming. Encouraged patient to eat more fruits and vegetables and decrease red meat.    Low back pain:  Patient has seen a Restaurant manager, fast food. Return to clinic 1 year for follow-up   No orders of the defined types were placed in this encounter.   The patient has a good understanding of the overall plan. she agrees with it. she will call with any problems that may develop before the next visit here.   Rulon Eisenmenger, MD 12/10/2015

## 2015-12-10 NOTE — Assessment & Plan Note (Signed)
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 ER 99% PR 99% HER-2 negative: Intermediate risk Oncotype DX recurrence score 23. Patient received 4 cycles of Taxotere Cytoxan chemotherapy. She had breast reconstruction subsequently in May 2015, started Arimidex May 2015  Surveillance:  1. Breast exam 12/10/2015 did not reveal any lumps or nodules 2. Mammogram 06/01/2015 is normal density category B 3. Bone density April 2015 showed T score -0.6  Arimidex toxicities: 1. Hot flashes accompanied by sweating especially in hot weather 2. Fatigue Right leg and back pain: We discussed the role of bone scan but we elected to not pursue it because it certainly appears to be arthritis in nature.  Survivorship:Discussed the importance of physical exercise in decreasing the likelihood of breast cancer recurrence. Recommended 30 mins daily 6 days a week of either brisk walking or cycling or swimming. Encouraged patient to eat more fruits and vegetables and decrease red meat.    Low back pain: Patient has seen a chiropractor. Return to clinic 1 year for follow-up

## 2015-12-10 NOTE — Telephone Encounter (Signed)
appt made and avs printed °

## 2016-02-05 DIAGNOSIS — H9311 Tinnitus, right ear: Secondary | ICD-10-CM | POA: Diagnosis not present

## 2016-02-05 DIAGNOSIS — M5116 Intervertebral disc disorders with radiculopathy, lumbar region: Secondary | ICD-10-CM | POA: Diagnosis not present

## 2016-02-05 DIAGNOSIS — M5136 Other intervertebral disc degeneration, lumbar region: Secondary | ICD-10-CM | POA: Diagnosis not present

## 2016-02-10 DIAGNOSIS — Z9221 Personal history of antineoplastic chemotherapy: Secondary | ICD-10-CM | POA: Diagnosis not present

## 2016-02-10 DIAGNOSIS — M5136 Other intervertebral disc degeneration, lumbar region: Secondary | ICD-10-CM | POA: Diagnosis not present

## 2016-02-10 DIAGNOSIS — M4806 Spinal stenosis, lumbar region: Secondary | ICD-10-CM | POA: Diagnosis not present

## 2016-02-10 DIAGNOSIS — Z853 Personal history of malignant neoplasm of breast: Secondary | ICD-10-CM | POA: Diagnosis not present

## 2016-02-10 DIAGNOSIS — M5116 Intervertebral disc disorders with radiculopathy, lumbar region: Secondary | ICD-10-CM | POA: Diagnosis not present

## 2016-03-01 ENCOUNTER — Other Ambulatory Visit: Payer: Self-pay | Admitting: Hematology and Oncology

## 2016-03-02 ENCOUNTER — Other Ambulatory Visit: Payer: Self-pay | Admitting: Hematology and Oncology

## 2016-05-13 ENCOUNTER — Ambulatory Visit (INDEPENDENT_AMBULATORY_CARE_PROVIDER_SITE_OTHER): Payer: Commercial Indemnity | Admitting: Physician Assistant

## 2016-05-13 ENCOUNTER — Encounter: Payer: Self-pay | Admitting: Physician Assistant

## 2016-05-13 VITALS — BP 115/64 | HR 56 | Temp 98.3°F | Ht 69.2 in | Wt 238.0 lb

## 2016-05-13 DIAGNOSIS — F329 Major depressive disorder, single episode, unspecified: Secondary | ICD-10-CM

## 2016-05-13 DIAGNOSIS — R413 Other amnesia: Secondary | ICD-10-CM

## 2016-05-13 DIAGNOSIS — F32A Depression, unspecified: Secondary | ICD-10-CM

## 2016-05-13 DIAGNOSIS — C50411 Malignant neoplasm of upper-outer quadrant of right female breast: Secondary | ICD-10-CM

## 2016-05-13 DIAGNOSIS — K219 Gastro-esophageal reflux disease without esophagitis: Secondary | ICD-10-CM

## 2016-05-13 DIAGNOSIS — Z6834 Body mass index (BMI) 34.0-34.9, adult: Secondary | ICD-10-CM

## 2016-05-13 DIAGNOSIS — E785 Hyperlipidemia, unspecified: Secondary | ICD-10-CM | POA: Insufficient documentation

## 2016-05-13 NOTE — Progress Notes (Signed)
BP 115/64   Pulse (!) 56   Temp 98.3 F (36.8 C) (Oral)   Ht 5' 9.2" (1.758 m)   Wt 238 lb (108 kg)   BMI 34.94 kg/m    Subjective:    Patient ID: Morgan Santos, female    DOB: June 14, 1951, 65 y.o.   MRN: [id number]  Morgan Santos is a 65 y.o. female presenting on 05/13/2016 for Establish Care  HPI Patient here to be established as new patient at [place].  This patient is known to me from [facility]. She is doing well overall, still seeing her oncologist every 6 months. She is doing better with her memory loss when taking meds from the neurologist. Depression is better also and trying to walk daily.  Still some joint and back pain when she over does things.     Relevant past medical, surgical, family and social history reviewed and updated as indicated. Interim medical history since our last visit reviewed. Allergies and medications reviewed and updated.   Data reviewed from any sources in EPIC.  Review of Systems  Constitutional: Positive for fatigue. Negative for activity change and fever.  HENT: Negative.   Eyes: Negative.   Respiratory: Negative.  Negative for cough.   Cardiovascular: Negative.  Negative for chest pain.  Gastrointestinal: Negative.  Negative for abdominal pain.  Endocrine: Negative.   Genitourinary: Negative.  Negative for dysuria and frequency.  Musculoskeletal: Negative.   Skin: Negative.   Neurological: Negative.  Negative for weakness.  Psychiatric/Behavioral: Positive for decreased concentration. The patient is nervous/anxious.     Per HPI unless specifically indicated above  Social History   Social History  . Marital status: Married    Spouse name: N/A  . Number of children: 4  . Years of education: N/A   Occupational History  . retired 2014    Social History Main Topics  . Smoking status: Former Smoker    Quit date: 09/14/1995  . Smokeless tobacco: Never Used  . Alcohol use No  . Drug use:    Types: Marijuana     Comment: in the past  . Sexual activity: Yes   Other Topics Concern  . Not on file   Social History Narrative  . No narrative on file    Past Surgical History:  Procedure Laterality Date  . BREAST RECONSTRUCTION WITH PLACEMENT OF TISSUE EXPANDER AND FLEX HD (ACELLULAR HYDRATED DERMIS) Right 07/12/2013   Procedure: RIGHT BREAST RECONSTRUCTION WITH PLACEMENT OF TISSUE EXPANDER AND FLEX HD TO RIGHT BREAST (ACELLULAR HYDRATED DERMIS);  Surgeon: Irene Limbo, MD;  Location: [facility];  Service: Plastics;  Laterality: Right;  . BREAST SURGERY     for abcesses- removed from both breasts, many yrs. ago  . CHOLECYSTECTOMY    . COLONOSCOPY   01/29/2004   NUR:A 7-8 mm polyp snared from the hepatic flexure.  Another 3 mm polyp was     cold snared from the sigmoid colon.External hemorrhoids, possible source of recent rectal bleeding  . COLONOSCOPY N/A 06/05/2014   Procedure: COLONOSCOPY;  Surgeon: Danie Binder, MD;  [facility];  Service: Endoscopy;  Laterality: N/A;  1030 - moved to 10:45 - Ginger to notify pt   . DILATION AND CURETTAGE OF UTERUS    . LUMBAR SPINE SURGERY    . MASTECTOMY    . MASTECTOMY W/ SENTINEL NODE BIOPSY Right 07/12/2013   Procedure: MASTECTOMY WITH SENTINEL LYMPH NODE BIOPSY;  Surgeon: Rolm Bookbinder, MD;  Location: [facility];  Service: General;  Laterality: Right;  . MASTOPEXY Left 01/14/2014   Procedure: LEFT BREAST MASTOPEXY FOR SYMMETRY ;  Surgeon: Irene Limbo, MD;  Location: [place];  Service: Plastics;  Laterality: Left;  . PORT-A-CATH REMOVAL Left 01/14/2014   Procedure: REMOVAL PORT-A-CATH;  Surgeon: Irene Limbo, MD;  Location: [place];  Service: Plastics;  Laterality: Left;  . PORTACATH PLACEMENT N/A 09/16/2013   Procedure: INSERTION PORT-A-CATH;  Surgeon: Rolm Bookbinder, MD;  Location: [place];  Service: General;  Laterality: N/A;  . REMOVAL OF BILATERAL TISSUE EXPANDERS WITH  PLACEMENT OF BILATERAL BREAST IMPLANTS Right 01/14/2014   Procedure: REMOVAL OF RIGHT TISSUE EXPANDERS WITH PLACEMENT OF PERMANENT BREAST IMPLANT;  Surgeon: Irene Limbo, MD;  Location: [place];  Service: Plastics;  Laterality: Right;  . TUBAL LIGATION    . VAGINAL HYSTERECTOMY      Family History  Problem Relation Age of Onset  . Hypertension Mother   . Diabetes type II Mother   . CVA Mother   . Heart disease Mother   . Osteoarthritis Mother   . Depression Mother   . Other Mother     degenerative disc disease  . Alcoholism Father   . Pneumonia Father   . Colon cancer Maternal Aunt     age 30s  . Colon cancer Other     maternal great aunt      Medication List       Accurate as of 05/13/16  5:09 PM. Always use your most recent med list.          ALPRAZolam 0.5 MG tablet Commonly known as:  XANAX Take 0.5 mg by mouth 2 (two) times daily as needed for anxiety.   anastrozole 1 MG tablet Commonly known as:  ARIMIDEX TAKE ONE TABLET BY MOUTH ONCE DAILY   Calcium-Magnesium-Zinc 500-250-12.5 MG Tabs Take by mouth 2 (two) times daily.   citalopram 40 MG tablet Commonly known as:  CELEXA Take 40 mg by mouth daily.   donepezil 10 MG tablet Commonly known as:  ARICEPT Take 10 mg by mouth daily.   gabapentin 300 MG capsule Commonly known as:  NEURONTIN Take 300 mg by mouth 2 (two) times daily.   meloxicam 7.5 MG tablet Commonly known as:  MOBIC Take 7.5 mg by mouth daily.   memantine 10 MG tablet Commonly known as:  NAMENDA Take 10 mg by mouth 2 (two) times daily.   omeprazole 20 MG capsule Commonly known as:  PRILOSEC Take 20 mg by mouth daily.   pravastatin 40 MG tablet Commonly known as:  PRAVACHOL Take 40 mg by mouth daily.   vitamin B-12 100 MCG tablet Commonly known as:  CYANOCOBALAMIN Take 100 mcg by mouth daily.   Vitamin D-3 5000 UNITS Tabs Take 2 each by mouth daily.          Objective:    BP 115/64   Pulse (!) 56    Temp 98.3 F (36.8 C) (Oral)   Ht 5' 9.2" (1.758 m)   Wt 238 lb (108 kg)   BMI 34.94 kg/m   Allergies  Allergen Reactions  . Codeine Nausea And Vomiting  . Crestor [Rosuvastatin Calcium]   . Lipitor [Atorvastatin]   . Multihance [Gadobenate] Nausea Only and Cough    PT HAD INCREASED MUCOUS PRODUCTION AND PHLEGMY COUGH LASTING 10 MINS AFTER INJECTION, PT SENT HOME WITH BENADRYL, PREV NAUSEA AFTER GAD   Wt Readings from Last 3 Encounters:  05/13/16 238 lb (108 kg)  12/10/15 231 lb (104.8 kg)  06/11/15 228 lb 4.8 oz (103.6 kg)    Physical Exam  Constitutional: She is oriented to person, place, and time. She appears well-developed and well-nourished.  HENT:  Head: Normocephalic and atraumatic.  Eyes: Conjunctivae and EOM are normal. Pupils are equal, round, and reactive to light.  Cardiovascular: Normal rate, regular rhythm, normal heart sounds and intact distal pulses.   Pulmonary/Chest: Effort normal and breath sounds normal.  Abdominal: Soft. Bowel sounds are normal.  Neurological: She is alert and oriented to person, place, and time. She has normal reflexes.  Skin: Skin is warm and dry. No rash noted.  Psychiatric: She has a normal mood and affect. Her behavior is normal. Judgment and thought content normal.  Nursing note and vitals reviewed.   Results for orders placed or performed in visit on 12/09/14  CBC with Differential  Result Value Ref Range   WBC 5.3 3.9 - 10.3 10e3/uL   NEUT# 2.6 1.5 - 6.5 10e3/uL   HGB 13.8 11.6 - 15.9 g/dL   HCT 41.6 34.8 - 46.6 %   Platelets 189 145 - 400 10e3/uL   MCV 89.7 79.5 - 101.0 fL   MCH 29.7 25.1 - 34.0 pg   MCHC 33.2 31.5 - 36.0 g/dL   RBC 4.64 3.70 - 5.45 10e6/uL   RDW 12.9 11.2 - 14.5 %   lymph# 2.2 0.9 - 3.3 10e3/uL   MONO# 0.4 0.1 - 0.9 10e3/uL   Eosinophils Absolute 0.1 0.0 - 0.5 10e3/uL   Basophils Absolute 0.0 0.0 - 0.1 10e3/uL   NEUT% 48.8 38.4 - 76.8 %   LYMPH% 41.3 14.0 - 49.7 %   MONO% 7.2 0.0 - 14.0 %   EOS%  2.1 0.0 - 7.0 %   BASO% 0.6 0.0 - 2.0 %  Comprehensive metabolic panel (Cmet) - [facility]  Result Value Ref Range   Sodium 141 136 - 145 mEq/L   Potassium 4.1 3.5 - 5.1 mEq/L   Chloride 106 98 - 109 mEq/L   CO2 23 22 - 29 mEq/L   Glucose 91 70 - 140 mg/dl   BUN 15.3 7.0 - 26.0 mg/dL   Creatinine 0.7 0.6 - 1.1 mg/dL   Total Bilirubin 0.62 0.20 - 1.20 mg/dL   Alkaline Phosphatase 141 40 - 150 U/L   AST 26 5 - 34 U/L   ALT 25 0 - 55 U/L   Total Protein 6.7 6.4 - 8.3 g/dL   Albumin 3.7 3.5 - 5.0 g/dL   Calcium 9.2 8.4 - 10.4 mg/dL   Anion Gap 12 (H) 3 - 11 mEq/L   EGFR >90 >90 ml/min/1.73 m2      Assessment & Plan:   1. Depression - citalopram (CELEXA) 40 MG tablet; Take 40 mg by mouth daily.; Refill: 11  2. Memory loss Continue with neurology - donepezil (ARICEPT) 10 MG tablet; Take 10 mg by mouth daily. - memantine (NAMENDA) 10 MG tablet; Take 10 mg by mouth 2 (two) times daily.  3. Breast cancer of upper-outer quadrant of right female breast ([place]) Continue with Gudena  4. Gastroesophageal reflux disease without esophagitis - vitamin B-12 (CYANOCOBALAMIN) 100 MCG tablet; Take 100 mcg by mouth daily.  5. Hyperlipidemia - pravastatin (PRAVACHOL) 40 MG tablet; Take 40 mg by mouth daily.; Refill: 5  6. Body mass index 34.0-34.9, adult  Plan labs soon after getting records from [facility].   Continue all other maintenance medications as listed above.  Educational handout given for stress management.  Follow up plan: Return for Will need checkup and labs soon.  [name] PA-C [place] [address]  [place], [place] [address] [phone]   05/13/2016, 5:09 PM

## 2016-05-13 NOTE — Patient Instructions (Signed)
Stress and Stress Management Stress is a normal reaction to life events. It is what you feel when life demands more than you are used to or more than you can handle. Some stress can be useful. For example, the stress reaction can help you catch the last bus of the day, study for a test, or meet a deadline at work. But stress that occurs too often or for too long can cause problems. It can affect your emotional health and interfere with relationships and normal daily activities. Too much stress can weaken your immune system and increase your risk for physical illness. If you already have a medical problem, stress can make it worse. CAUSES  All sorts of life events may cause stress. An event that causes stress for one person may not be stressful for another person. Major life events commonly cause stress. These may be positive or negative. Examples include losing your job, moving into a new home, getting married, having a baby, or losing a loved one. Less obvious life events may also cause stress, especially if they occur day after day or in combination. Examples include working long hours, driving in traffic, caring for children, being in debt, or being in a difficult relationship. SIGNS AND SYMPTOMS Stress may cause emotional symptoms including, the following:  Anxiety. This is feeling worried, afraid, on edge, overwhelmed, or out of control.  Anger. This is feeling irritated or impatient.  Depression. This is feeling sad, down, helpless, or guilty.  Difficulty focusing, remembering, or making decisions. Stress may cause physical symptoms, including the following:   Aches and pains. These may affect your head, neck, back, stomach, or other areas of your body.  Tight muscles or clenched jaw.  Low energy or trouble sleeping. Stress may cause unhealthy behaviors, including the following:   Eating to feel better (overeating) or skipping meals.  Sleeping too little, too much, or both.  Working  too much or putting off tasks (procrastination).  Smoking, drinking alcohol, or using drugs to feel better. DIAGNOSIS  Stress is diagnosed through an assessment by your health care provider. Your health care provider will ask questions about your symptoms and any stressful life events.Your health care provider will also ask about your medical history and may order blood tests or other tests. Certain medical conditions and medicine can cause physical symptoms similar to stress. Mental illness can cause emotional symptoms and unhealthy behaviors similar to stress. Your health care provider may refer you to a mental health professional for further evaluation.  TREATMENT  Stress management is the recommended treatment for stress.The goals of stress management are reducing stressful life events and coping with stress in healthy ways.  Techniques for reducing stressful life events include the following:  Stress identification. Self-monitor for stress and identify what causes stress for you. These skills may help you to avoid some stressful events.  Time management. Set your priorities, keep a calendar of events, and learn to say "no." These tools can help you avoid making too many commitments. Techniques for coping with stress include the following:  Rethinking the problem. Try to think realistically about stressful events rather than ignoring them or overreacting. Try to find the positives in a stressful situation rather than focusing on the negatives.  Exercise. Physical exercise can release both physical and emotional tension. The key is to find a form of exercise you enjoy and do it regularly.  Relaxation techniques. These relax the body and mind. Examples include yoga, meditation, tai chi, biofeedback, deep  breathing, progressive muscle relaxation, listening to music, being out in nature, journaling, and other hobbies. Again, the key is to find one or more that you enjoy and can do  regularly.  Healthy lifestyle. Eat a balanced diet, get plenty of sleep, and do not smoke. Avoid using alcohol or drugs to relax.  Strong support network. Spend time with family, friends, or other people you enjoy being around.Express your feelings and talk things over with someone you trust. Counseling or talktherapy with a mental health professional may be helpful if you are having difficulty managing stress on your own. Medicine is typically not recommended for the treatment of stress.Talk to your health care provider if you think you need medicine for symptoms of stress. HOME CARE INSTRUCTIONS  Keep all follow-up visits as directed by your health care provider.  Take all medicines as directed by your health care provider. SEEK MEDICAL CARE IF:  Your symptoms get worse or you start having new symptoms.  You feel overwhelmed by your problems and can no longer manage them on your own. SEEK IMMEDIATE MEDICAL CARE IF:  You feel like hurting yourself or someone else.   This information is not intended to replace advice given to you by your health care provider. Make sure you discuss any questions you have with your health care provider.   Document Released: 02/01/2001 Document Revised: 08/29/2014 Document Reviewed: 04/02/2013 Elsevier Interactive Patient Education 2016 Elsevier Inc.  

## 2016-05-31 ENCOUNTER — Other Ambulatory Visit: Payer: Self-pay | Admitting: Hematology and Oncology

## 2016-06-02 ENCOUNTER — Other Ambulatory Visit: Payer: Self-pay | Admitting: Physician Assistant

## 2016-06-02 DIAGNOSIS — Z1231 Encounter for screening mammogram for malignant neoplasm of breast: Secondary | ICD-10-CM

## 2016-06-07 ENCOUNTER — Ambulatory Visit (INDEPENDENT_AMBULATORY_CARE_PROVIDER_SITE_OTHER): Payer: Commercial Indemnity | Admitting: Physician Assistant

## 2016-06-07 ENCOUNTER — Encounter: Payer: Self-pay | Admitting: Physician Assistant

## 2016-06-07 VITALS — BP 121/74 | HR 52 | Temp 97.4°F | Ht 69.2 in | Wt 240.0 lb

## 2016-06-07 DIAGNOSIS — E782 Mixed hyperlipidemia: Secondary | ICD-10-CM | POA: Diagnosis not present

## 2016-06-07 DIAGNOSIS — Z6834 Body mass index (BMI) 34.0-34.9, adult: Secondary | ICD-10-CM

## 2016-06-07 DIAGNOSIS — F3341 Major depressive disorder, recurrent, in partial remission: Secondary | ICD-10-CM

## 2016-06-07 DIAGNOSIS — Z01419 Encounter for gynecological examination (general) (routine) without abnormal findings: Secondary | ICD-10-CM

## 2016-06-07 DIAGNOSIS — C50411 Malignant neoplasm of upper-outer quadrant of right female breast: Secondary | ICD-10-CM

## 2016-06-07 NOTE — Patient Instructions (Signed)
Cholesterol  Cholesterol is a fat. Your body needs a small amount of cholesterol. Cholesterol may build up in your blood vessels. This increases your chance of having a heart attack or stroke.  You cannot feel your cholesterol levels. The only way to know your cholesterol level is high is with a blood test. Keep your test results. Work with your doctor to keep your cholesterol at a good level.  WHAT DO THE TEST RESULTS MEAN?  · Total cholesterol is how much cholesterol is in your blood.  · LDL is bad cholesterol. This is the type that can build up. You want LDL to be low.  · HDL is good cholesterol. It cleans your blood vessels and carries LDL away. You want HDL to be high.  · Triglycerides are fat that the body can burn for energy or store.  WHAT ARE GOOD LEVELS OF CHOLESTEROL?  · Total cholesterol below 200.  · LDL below 100 for people at risk. Below 70 for those at very high risk.  · HDL above 50 is good. Above 60 is best.  · Triglycerides below 150.  HOW CAN I LOWER MY CHOLESTEROL?  · Diet. Follow your diet programs as told by your doctor.    Choose fish, white meat chicken, roasted turkey, or baked turkey. Try not to eat red meat, fried foods, or processed meats such as sausage and lunch meats.    Eat lots of fresh fruits and vegetables.    Choose whole grains, beans, pasta, potatoes, and cereals.    Use only small amounts of olive, corn, or canola oils.    Try not to eat butter, mayonnaise, shortening, or palm kernel oils.    Try not to eat foods with trans fats.    Drink skim or nonfat milk. Eat low-fat or nonfat yogurt and cheeses. Try not to drink whole milk or cream. Try not to eat ice cream, egg yolks, and full-fat cheeses.    Healthy desserts include Anysia Choi food cake, ginger snaps, animal crackers, hard candy, popsicles, and low-fat or nonfat frozen yogurt. Try not to eat pastries, cakes, pies, and cookies.  · Exercise. Follow your exercise programs as told by your doctor.    Be more active. You can try  gardening, walking, or taking the stairs. Ask your doctor about how you can be more active.  · Medicine. Take medicine as told by your doctor.     This information is not intended to replace advice given to you by your health care provider. Make sure you discuss any questions you have with your health care provider.     Document Released: 11/04/2008 Document Revised: 08/29/2014 Document Reviewed: 05/22/2013  Elsevier Interactive Patient Education ©2016 Elsevier Inc.

## 2016-06-07 NOTE — Progress Notes (Signed)
BP 121/74   Pulse (!) 52   Temp 97.4 F (36.3 C) (Oral)   Ht 5' 9.2" (1.758 m)   Wt 240 lb (108.9 kg)   BMI 35.24 kg/m    Subjective:    Patient ID: Kathlen Brunswick, female    DOB: 28-Nov-1950, 65 y.o.   MRN: [id number]  NOE PITTSLEY is a 65 y.o. female presenting on 06/07/2016 for Annual Exam In addition a recheck on her conditions, breast cancer history, hyperlipidemia, depression, GERD  HPI She'll be seen her oncologist for her breast cancer again soon. She does every 6 months at this point. She also needs lab work performed for her elevated cholesterol. All of her medications are reviewed today, refills will be sent as needed. There are no new complaints from this patient.  Relevant past medical, surgical, family and social history reviewed and updated as indicated. Interim medical history since our last visit reviewed. Allergies and medications reviewed and updated.   Data reviewed from any sources in EPIC.  Review of Systems  Constitutional: Positive for fatigue. Negative for activity change, fever and unexpected weight change.  HENT: Negative.   Eyes: Negative.   Respiratory: Negative.  Negative for cough, shortness of breath and wheezing.   Cardiovascular: Negative.  Negative for chest pain, palpitations and leg swelling.  Gastrointestinal: Negative.  Negative for abdominal pain.  Endocrine: Negative.   Genitourinary: Negative.  Negative for dysuria.  Musculoskeletal: Negative.   Skin: Negative.  Negative for rash.  Neurological: Negative.  Negative for dizziness, tremors and weakness.  Psychiatric/Behavioral: Positive for dysphoric mood and sleep disturbance. The patient is nervous/anxious.      Social History   Social History  . Marital status: Married    Spouse name: N/A  . Number of children: 4  . Years of education: N/A   Occupational History  . retired 2014    Social History Main Topics  . Smoking status: Former Smoker    Quit date: 09/14/1995  .  Smokeless tobacco: Never Used  . Alcohol use No  . Drug use:     Types: Marijuana     Comment: in the past  . Sexual activity: Yes   Other Topics Concern  . Not on file   Social History Narrative  . No narrative on file    Past Surgical History:  Procedure Laterality Date  . BREAST RECONSTRUCTION WITH PLACEMENT OF TISSUE EXPANDER AND FLEX HD (ACELLULAR HYDRATED DERMIS) Right 07/12/2013   Procedure: RIGHT BREAST RECONSTRUCTION WITH PLACEMENT OF TISSUE EXPANDER AND FLEX HD TO RIGHT BREAST (ACELLULAR HYDRATED DERMIS);  Surgeon: Irene Limbo, MD;  Location: [facility];  Service: Plastics;  Laterality: Right;  . BREAST SURGERY     for abcesses- removed from both breasts, many yrs. ago  . CHOLECYSTECTOMY    . COLONOSCOPY   01/29/2004   NUR:A 7-8 mm polyp snared from the hepatic flexure.  Another 3 mm polyp was     cold snared from the sigmoid colon.External hemorrhoids, possible source of recent rectal bleeding  . COLONOSCOPY N/A 06/05/2014   Procedure: COLONOSCOPY;  Surgeon: Danie Binder, MD;  [facility];  Service: Endoscopy;  Laterality: N/A;  1030 - moved to 10:45 - Ginger to notify pt   . DILATION AND CURETTAGE OF UTERUS    . LUMBAR SPINE SURGERY    . MASTECTOMY    . MASTECTOMY W/ SENTINEL NODE BIOPSY Right 07/12/2013   Procedure: MASTECTOMY WITH SENTINEL LYMPH NODE BIOPSY;  Surgeon:  Rolm Bookbinder, MD;  Location: [facility];  Service: General;  Laterality: Right;  . MASTOPEXY Left 01/14/2014   Procedure: LEFT BREAST MASTOPEXY FOR SYMMETRY ;  Surgeon: Irene Limbo, MD;  Location: [facility];  Service: Plastics;  Laterality: Left;  . PORT-A-CATH REMOVAL Left 01/14/2014   Procedure: REMOVAL PORT-A-CATH;  Surgeon: Irene Limbo, MD;  Location: [facility];  Service: Plastics;  Laterality: Left;  . PORTACATH PLACEMENT N/A 09/16/2013   Procedure: INSERTION PORT-A-CATH;  Surgeon: Rolm Bookbinder, MD;  Location: [facility];  Service: General;   Laterality: N/A;  . REMOVAL OF BILATERAL TISSUE EXPANDERS WITH PLACEMENT OF BILATERAL BREAST IMPLANTS Right 01/14/2014   Procedure: REMOVAL OF RIGHT TISSUE EXPANDERS WITH PLACEMENT OF PERMANENT BREAST IMPLANT;  Surgeon: Irene Limbo, MD;  Location: [facility];  Service: Plastics;  Laterality: Right;  . TUBAL LIGATION    . VAGINAL HYSTERECTOMY      Family History  Problem Relation Age of Onset  . Hypertension Mother   . Diabetes type II Mother   . CVA Mother   . Heart disease Mother   . Osteoarthritis Mother   . Depression Mother   . Other Mother     degenerative disc disease  . Alcoholism Father   . Pneumonia Father   . Colon cancer Maternal Aunt     age 59s  . Colon cancer Other     maternal great aunt      Medication List       Accurate as of 06/07/16 11:13 AM. Always use your most recent med list.          ALPRAZolam 0.5 MG tablet Commonly known as:  XANAX Take 0.5 mg by mouth 2 (two) times daily as needed for anxiety.   anastrozole 1 MG tablet Commonly known as:  ARIMIDEX TAKE ONE TABLET BY MOUTH ONCE DAILY   Calcium-Magnesium-Zinc 500-250-12.5 MG Tabs Take by mouth 2 (two) times daily.   citalopram 40 MG tablet Commonly known as:  CELEXA Take 40 mg by mouth daily.   donepezil 10 MG tablet Commonly known as:  ARICEPT Take 10 mg by mouth daily.   gabapentin 300 MG capsule Commonly known as:  NEURONTIN Take 300 mg by mouth 2 (two) times daily.   meloxicam 7.5 MG tablet Commonly known as:  MOBIC Take 7.5 mg by mouth daily.   memantine 10 MG tablet Commonly known as:  NAMENDA Take 10 mg by mouth 2 (two) times daily.   omeprazole 20 MG capsule Commonly known as:  PRILOSEC Take 20 mg by mouth daily.   pravastatin 40 MG tablet Commonly known as:  PRAVACHOL Take 40 mg by mouth daily.   vitamin B-12 100 MCG tablet Commonly known as:  CYANOCOBALAMIN Take 100 mcg by mouth daily.   Vitamin D-3 5000 UNITS Tabs Take 2 each by mouth  daily.          Objective:    BP 121/74   Pulse (!) 52   Temp 97.4 F (36.3 C) (Oral)   Ht 5' 9.2" (1.758 m)   Wt 240 lb (108.9 kg)   BMI 35.24 kg/m   Allergies  Allergen Reactions  . Codeine Nausea And Vomiting  . Crestor [Rosuvastatin Calcium]   . Lipitor [Atorvastatin]   . Multihance [Gadobenate] Nausea Only and Cough    PT HAD INCREASED MUCOUS PRODUCTION AND PHLEGMY COUGH LASTING 10 MINS AFTER INJECTION, PT SENT HOME WITH BENADRYL, PREV NAUSEA AFTER GAD   Wt Readings from Last  3 Encounters:  06/07/16 240 lb (108.9 kg)  05/13/16 238 lb (108 kg)  12/10/15 231 lb (104.8 kg)    Physical Exam  Constitutional: She is oriented to person, place, and time. She appears well-developed and well-nourished.  HENT:  Head: Normocephalic and atraumatic.  Eyes: Conjunctivae and EOM are normal. Pupils are equal, round, and reactive to light.  Neck: Normal range of motion. Neck supple.  Cardiovascular: Normal rate, regular rhythm, normal heart sounds and intact distal pulses.   Pulmonary/Chest: Effort normal and breath sounds normal. Right breast exhibits skin change. Right breast exhibits no mass and no tenderness. Left breast exhibits skin change. Left breast exhibits no mass and no tenderness. Breasts are asymmetrical.    Right breast with post mastectomy scar and implant in place no masses palpated. Left breast with scars from breast reduction. No palpable masses skin changes or discharge.  Abdominal: Soft. Bowel sounds are normal.  Genitourinary: Vagina normal. Rectal exam shows no fissure. No breast swelling, tenderness, discharge or bleeding. There is no tenderness or lesion on the right labia. There is no tenderness or lesion on the left labia. Right adnexum displays no mass, no tenderness and no fullness. Left adnexum displays no mass, no tenderness and no fullness. No tenderness or bleeding in the vagina. No vaginal discharge found.  Genitourinary Comments: S/p hysterectomy for  fibroids.  Neurological: She is alert and oriented to person, place, and time. She has normal reflexes.  Skin: Skin is warm and dry. No rash noted.  Psychiatric: She has a normal mood and affect. Her behavior is normal. Judgment and thought content normal.   Depression screen [facility] 2/9 06/07/2016 05/13/2016  Decreased Interest 2 1  Down, Depressed, Hopeless 2 1  PHQ - 2 Score 4 2  Altered sleeping 3 3  Tired, decreased energy 3 2  Change in appetite 3 2  Feeling bad or failure about yourself  3 1  Trouble concentrating 2 2  Moving slowly or fidgety/restless 1 1  Suicidal thoughts 1 1  PHQ-9 Score 20 14  Difficult doing work/chores - Somewhat difficult        Assessment & Plan:   1. Well female exam with routine gynecological exam - CMP14+EGFR - CBC with Differential/Platelet - Lipid panel - Pap IG (Image Guided)  2. Malignant neoplasm of upper-outer quadrant of right female breast, unspecified estrogen receptor status ([place]) - CMP14+EGFR - CBC with Differential/Platelet - Lipid panel  3. Mixed hyperlipidemia - Lipid panel  4. Body mass index 34.0-34.9, adult  5. Recurrent major depressive disorder, in partial remission ([place])   Continue all other maintenance medications as listed above. Educational handout given for cholesterol information  Follow up plan: Return 3 months for follow up.  [name] PA-C [facility] [address]  [place], [place] [address] [phone]   06/07/2016, 11:13 AM

## 2016-06-08 LAB — CBC WITH DIFFERENTIAL/PLATELET
BASOS: 1 %
Basophils Absolute: 0 10*3/uL (ref 0.0–0.2)
EOS (ABSOLUTE): 0.1 10*3/uL (ref 0.0–0.4)
EOS: 3 %
HEMATOCRIT: 40.8 % (ref 34.0–46.6)
Hemoglobin: 13.4 g/dL (ref 11.1–15.9)
IMMATURE GRANULOCYTES: 0 %
Immature Grans (Abs): 0 10*3/uL (ref 0.0–0.1)
Lymphocytes Absolute: 1.8 10*3/uL (ref 0.7–3.1)
Lymphs: 42 %
MCH: 29.1 pg (ref 26.6–33.0)
MCHC: 32.8 g/dL (ref 31.5–35.7)
MCV: 89 fL (ref 79–97)
MONOS ABS: 0.3 10*3/uL (ref 0.1–0.9)
Monocytes: 6 %
NEUTROS ABS: 2.1 10*3/uL (ref 1.4–7.0)
NEUTROS PCT: 48 %
Platelets: 227 10*3/uL (ref 150–379)
RBC: 4.61 x10E6/uL (ref 3.77–5.28)
RDW: 13.9 % (ref 12.3–15.4)
WBC: 4.4 10*3/uL (ref 3.4–10.8)

## 2016-06-08 LAB — CMP14+EGFR
A/G RATIO: 1.6 (ref 1.2–2.2)
ALBUMIN: 4.1 g/dL (ref 3.6–4.8)
ALT: 14 IU/L (ref 0–32)
AST: 17 IU/L (ref 0–40)
Alkaline Phosphatase: 156 IU/L — ABNORMAL HIGH (ref 39–117)
BUN / CREAT RATIO: 24 (ref 12–28)
BUN: 16 mg/dL (ref 8–27)
Bilirubin Total: 0.6 mg/dL (ref 0.0–1.2)
CALCIUM: 9.4 mg/dL (ref 8.7–10.3)
CO2: 25 mmol/L (ref 18–29)
CREATININE: 0.66 mg/dL (ref 0.57–1.00)
Chloride: 101 mmol/L (ref 96–106)
GFR, EST AFRICAN AMERICAN: 107 mL/min/{1.73_m2} (ref >59)
GFR, EST NON AFRICAN AMERICAN: 93 mL/min/{1.73_m2} (ref >59)
GLOBULIN, TOTAL: 2.5 g/dL (ref 1.5–4.5)
Glucose: 88 mg/dL (ref 65–99)
POTASSIUM: 4.3 mmol/L (ref 3.5–5.2)
SODIUM: 141 mmol/L (ref 134–144)
TOTAL PROTEIN: 6.6 g/dL (ref 6.0–8.5)

## 2016-06-08 LAB — LIPID PANEL
CHOL/HDL RATIO: 4 ratio (ref 0.0–4.4)
Cholesterol, Total: 197 mg/dL (ref 100–199)
HDL: 49 mg/dL (ref >39)
LDL Calculated: 120 mg/dL — ABNORMAL HIGH (ref 0–99)
Triglycerides: 141 mg/dL (ref 0–149)
VLDL CHOLESTEROL CAL: 28 mg/dL (ref 5–40)

## 2016-06-10 LAB — PAP IG (IMAGE GUIDED): PAP Smear Comment: 0

## 2016-06-16 ENCOUNTER — Ambulatory Visit
Admission: RE | Admit: 2016-06-16 | Discharge: 2016-06-16 | Disposition: A | Discharge status: Home / Self Care | Payer: 59 | Source: Ambulatory Visit | Attending: Physician Assistant | Admitting: Physician Assistant

## 2016-06-16 DIAGNOSIS — Z1231 Encounter for screening mammogram for malignant neoplasm of breast: Secondary | ICD-10-CM

## 2016-06-29 DIAGNOSIS — Z853 Personal history of malignant neoplasm of breast: Secondary | ICD-10-CM | POA: Diagnosis not present

## 2016-06-29 DIAGNOSIS — N651 Disproportion of reconstructed breast: Secondary | ICD-10-CM | POA: Diagnosis not present

## 2016-06-29 DIAGNOSIS — G309 Alzheimer's disease, unspecified: Secondary | ICD-10-CM | POA: Diagnosis not present

## 2016-07-21 DIAGNOSIS — Z853 Personal history of malignant neoplasm of breast: Secondary | ICD-10-CM | POA: Diagnosis not present

## 2016-07-21 DIAGNOSIS — F0390 Unspecified dementia without behavioral disturbance: Secondary | ICD-10-CM | POA: Diagnosis not present

## 2016-07-21 DIAGNOSIS — E785 Hyperlipidemia, unspecified: Secondary | ICD-10-CM | POA: Diagnosis not present

## 2016-07-21 DIAGNOSIS — I10 Essential (primary) hypertension: Secondary | ICD-10-CM | POA: Diagnosis not present

## 2016-07-21 DIAGNOSIS — Z87891 Personal history of nicotine dependence: Secondary | ICD-10-CM | POA: Diagnosis not present

## 2016-08-10 ENCOUNTER — Other Ambulatory Visit: Payer: Self-pay | Admitting: *Deleted

## 2016-08-10 DIAGNOSIS — E785 Hyperlipidemia, unspecified: Secondary | ICD-10-CM

## 2016-08-10 MED ORDER — PRAVASTATIN SODIUM 40 MG PO TABS: 40.0000 mg | ORAL_TABLET | Freq: Every day | ORAL | 5 refills | Status: DC

## 2016-08-11 ENCOUNTER — Ambulatory Visit: Payer: 59 | Admitting: Orthopedic Surgery

## 2016-08-30 ENCOUNTER — Ambulatory Visit: Payer: 59 | Admitting: Orthopedic Surgery

## 2016-08-30 ENCOUNTER — Other Ambulatory Visit: Payer: Self-pay

## 2016-08-30 MED ORDER — GABAPENTIN 300 MG PO CAPS
300.0000 mg | ORAL_CAPSULE | Freq: Two times a day (BID) | ORAL | 0 refills | Status: DC
Start: 2016-08-30 — End: 2016-10-14

## 2016-09-19 ENCOUNTER — Encounter: Payer: Self-pay | Admitting: Orthopedic Surgery

## 2016-09-19 ENCOUNTER — Ambulatory Visit (INDEPENDENT_AMBULATORY_CARE_PROVIDER_SITE_OTHER): Payer: 59

## 2016-09-19 ENCOUNTER — Ambulatory Visit (INDEPENDENT_AMBULATORY_CARE_PROVIDER_SITE_OTHER): Payer: 59 | Admitting: Orthopedic Surgery

## 2016-09-19 VITALS — BP 111/81 | HR 69 | Ht 69.2 in | Wt 240.0 lb

## 2016-09-19 DIAGNOSIS — M48061 Spinal stenosis, lumbar region without neurogenic claudication: Secondary | ICD-10-CM | POA: Diagnosis not present

## 2016-09-19 DIAGNOSIS — M1710 Unilateral primary osteoarthritis, unspecified knee: Secondary | ICD-10-CM

## 2016-09-19 NOTE — Progress Notes (Signed)
Patient ID: Morgan Santos, female   DOB: 1951/02/24, 66 y.o.   MRN: [id number]  Chief Complaint  Patient presents with  . Follow-up    left knee arthritis    HPI Morgan Santos is a 66 y.o. female.   HPI  Today we have a 66 year old female 1 year follow-up after left knee arthritis diagnoses currently maintained on meloxicam. She's having no complaints in the left knee  Review of Systems Review of Systems  She does complain of lower back pain with left leg radiating symptoms and sometimes having difficulty with ambulation she had an MRI of her back which showed spinal stenosis she has been referred to neurosurgery and I've advised her to continue with that appointment although I did review the MRI report and it definitely shows multilevel multifactorial spondylolisthesis and spinal stenosis and degenerative disc disease   Physical Exam  Her appearance is normal she is oriented 3 her mood is pleasant her affect is normal she walks without support. She has normal flexion extension range of motion no instability no swelling or tenderness in the left knee   MEDICAL DECISION MAKING  DATA   Plain films today show mild arthritis in all 3 compartments no progression  DIAGNOSIS  Encounter Diagnoses  Name Primary?  Marland Kitchen Arthritis of knee Yes  . Spinal stenosis of lumbar region, unspecified whether neurogenic claudication present      PLAN(RISK)    Recommend continue current medication see primary care doctor and/or neurosurgery for ongoing complaints lower back and left leg

## 2016-09-20 ENCOUNTER — Ambulatory Visit: Payer: Medicare Other | Admitting: Physician Assistant

## 2016-09-21 ENCOUNTER — Encounter: Payer: Self-pay | Admitting: Physician Assistant

## 2016-09-21 ENCOUNTER — Ambulatory Visit (INDEPENDENT_AMBULATORY_CARE_PROVIDER_SITE_OTHER): Payer: Commercial Indemnity | Admitting: Physician Assistant

## 2016-09-21 VITALS — BP 121/78 | HR 81 | Temp 97.8°F | Ht 69.2 in | Wt 240.6 lb

## 2016-09-21 DIAGNOSIS — Z853 Personal history of malignant neoplasm of breast: Secondary | ICD-10-CM | POA: Insufficient documentation

## 2016-09-21 DIAGNOSIS — R6889 Other general symptoms and signs: Secondary | ICD-10-CM

## 2016-09-21 DIAGNOSIS — J989 Respiratory disorder, unspecified: Secondary | ICD-10-CM

## 2016-09-21 DIAGNOSIS — E7800 Pure hypercholesterolemia, unspecified: Secondary | ICD-10-CM

## 2016-09-21 DIAGNOSIS — R0989 Other specified symptoms and signs involving the circulatory and respiratory systems: Secondary | ICD-10-CM

## 2016-09-21 DIAGNOSIS — J40 Bronchitis, not specified as acute or chronic: Secondary | ICD-10-CM | POA: Insufficient documentation

## 2016-09-21 DIAGNOSIS — R5383 Other fatigue: Secondary | ICD-10-CM

## 2016-09-21 MED ORDER — BUDESONIDE-FORMOTEROL FUMARATE 160-4.5 MCG/ACT IN AERO
1.0000 | INHALATION_SPRAY | Freq: Two times a day (BID) | RESPIRATORY_TRACT | 0 refills | Status: DC
Start: 2016-09-21 — End: 2016-11-01

## 2016-09-21 NOTE — Patient Instructions (Signed)
Asthma, Acute Bronchospasm °Acute bronchospasm caused by asthma is also referred to as an asthma attack. Bronchospasm means your air passages become narrowed. The narrowing is caused by inflammation and tightening of the muscles in the air tubes (bronchi) in your lungs. This can make it hard to breathe or cause you to wheeze and cough. °What are the causes? °Possible triggers are: °· Animal dander from the skin, hair, or feathers of animals. °· Dust mites contained in house dust. °· Cockroaches. °· Pollen from trees or grass. °· Mold. °· Cigarette or tobacco smoke. °· Air pollutants such as dust, household cleaners, hair sprays, aerosol sprays, paint fumes, strong chemicals, or strong odors. °· Cold air or weather changes. Cold air may trigger inflammation. Winds increase molds and pollens in the air. °· Strong emotions such as crying or laughing hard. °· Stress. °· Certain medicines such as aspirin or beta-blockers. °· Sulfites in foods and drinks, such as dried fruits and wine. °· Infections or inflammatory conditions, such as a flu, cold, or inflammation of the nasal membranes (rhinitis). °· Gastroesophageal reflux disease (GERD). GERD is a condition where stomach acid backs up into your esophagus. °· Exercise or strenuous activity. ° °What are the signs or symptoms? °· Wheezing. °· Excessive coughing, particularly at night. °· Chest tightness. °· Shortness of breath. °How is this diagnosed? °Your health care provider will ask you about your medical history and perform a physical exam. A chest X-ray or blood testing may be performed to look for other causes of your symptoms or other conditions that may have triggered your asthma attack. °How is this treated? °Treatment is aimed at reducing inflammation and opening up the airways in your lungs. Most asthma attacks are treated with inhaled medicines. These include quick relief or rescue medicines (such as bronchodilators) and controller medicines (such as inhaled  corticosteroids). These medicines are sometimes given through an inhaler or a nebulizer. Systemic steroid medicine taken by mouth or given through an IV tube also can be used to reduce the inflammation when an attack is moderate or severe. Antibiotic medicines are only used if a bacterial infection is present. °Follow these instructions at home: °· Rest. °· Drink plenty of liquids. This helps the mucus to remain thin and be easily coughed up. Only use caffeine in moderation and do not use alcohol until you have recovered from your illness. °· Do not smoke. Avoid being exposed to secondhand smoke. °· You play a critical role in keeping yourself in good health. Avoid exposure to things that cause you to wheeze or to have breathing problems. °· Keep your medicines up-to-date and available. Carefully follow your health care provider’s treatment plan. °· Take your medicine exactly as prescribed. °· When pollen or pollution is bad, keep windows closed and use an air conditioner or go to places with air conditioning. °· Asthma requires careful medical care. See your health care provider for a follow-up as advised. If you are more than [redacted] weeks pregnant and you were prescribed any new medicines, let your obstetrician know about the visit and how you are doing. Follow up with your health care provider as directed. °· After you have recovered from your asthma attack, make an appointment with your outpatient doctor to talk about ways to reduce the likelihood of future attacks. If you do not have a doctor who manages your asthma, make an appointment with a primary care doctor to discuss your asthma. °Get help right away if: °· You are getting worse. °·   You have trouble breathing. If severe, call your local emergency services (911 in the [place] °· You develop chest pain or discomfort. °· You are vomiting. °· You are not able to keep fluids down. °· You are coughing up yellow, green, brown, or bloody sputum. °· You have a fever  and your symptoms suddenly get worse. °· You have trouble swallowing. °This information is not intended to replace advice given to you by your health care provider. Make sure you discuss any questions you have with your health care provider. °Document Released: 11/23/2006 Document Revised: 01/20/2016 Document Reviewed: 02/13/2013 °Elsevier Interactive Patient Education © 2017 Elsevier Inc. ° °

## 2016-09-21 NOTE — Progress Notes (Signed)
BP 121/78   Pulse 81   Temp 97.8 F (36.6 C) (Oral)   Ht 5' 9.2" (1.758 m)   Wt 240 lb 9.6 oz (109.1 kg)   BMI 35.33 kg/m    Subjective:    Patient ID: Morgan Santos, female    DOB: 10/03/1950, 66 y.o.   MRN: [id number]  HPI: Morgan Santos is a 66 y.o. female presenting on 09/21/2016 for Presyncopal episode  Patient comes in today for episodes where she got very hot felt syncopal but did not pass out. She also describes some shortness of breath feelings. The first time occurred when she was walking around and indoor flea market and was smelling perfume. The second episode happened while she was standing in church. She was singing and was in the middle of a crowd. She did feel somewhat short of breath. She came in our office the day and from a very cold temperature outside to warm air inside she had a coughing episode. She calmed down quickly. But she states she did feel short of breath.  Relevant past medical, surgical, family and social history reviewed and updated as indicated. Allergies and medications reviewed and updated.  Past Medical History:  Diagnosis Date  . Anemia    when she was younger  . Anxiety   . Arthritis    hip- R, back; osteoarthritis  . Breast cancer ([place])   . Complication of anesthesia   . Constipation   . Depression    pt. remarks that she "takes respridal because if I don't take it I get angry"   . GERD (gastroesophageal reflux disease)    pt. uses vinegar for heartburn, ususally once per week   . H/O degenerative disc disease   . Hyperlipidemia   . Hypertension    "when I get upset"  Not on medication  . PONV (postoperative nausea and vomiting)    "just for one day"  . Stroke [facility]) ?2010 or 2011   2010, [facility] for stroke "mini stroke" short term memory problems since  . Wears dentures    top  . Wears glasses     Past Surgical History:  Procedure Laterality Date  . BREAST RECONSTRUCTION WITH PLACEMENT OF TISSUE EXPANDER AND FLEX HD  (ACELLULAR HYDRATED DERMIS) Right 07/12/2013   Procedure: RIGHT BREAST RECONSTRUCTION WITH PLACEMENT OF TISSUE EXPANDER AND FLEX HD TO RIGHT BREAST (ACELLULAR HYDRATED DERMIS);  Surgeon: Irene Limbo, MD;  Location: [facility];  Service: Plastics;  Laterality: Right;  . BREAST SURGERY     for abcesses- removed from both breasts, many yrs. ago  . CHOLECYSTECTOMY    . COLONOSCOPY   01/29/2004   NUR:A 7-8 mm polyp snared from the hepatic flexure.  Another 3 mm polyp was     cold snared from the sigmoid colon.External hemorrhoids, possible source of recent rectal bleeding  . COLONOSCOPY N/A 06/05/2014   Procedure: COLONOSCOPY;  Surgeon: Danie Binder, MD;  [facility];  Service: Endoscopy;  Laterality: N/A;  1030 - moved to 10:45 - Ginger to notify pt   . DILATION AND CURETTAGE OF UTERUS    . LUMBAR SPINE SURGERY    . MASTECTOMY    . MASTECTOMY W/ SENTINEL NODE BIOPSY Right 07/12/2013   Procedure: MASTECTOMY WITH SENTINEL LYMPH NODE BIOPSY;  Surgeon: Rolm Bookbinder, MD;  Location: [place];  Service: General;  Laterality: Right;  . MASTOPEXY Left 01/14/2014   Procedure: LEFT BREAST MASTOPEXY FOR SYMMETRY ;  Surgeon: Irene Limbo,  MD;  Location: [facility];  Service: Plastics;  Laterality: Left;  . PORT-A-CATH REMOVAL Left 01/14/2014   Procedure: REMOVAL PORT-A-CATH;  Surgeon: Irene Limbo, MD;  Location: [facility];  Service: Plastics;  Laterality: Left;  . PORTACATH PLACEMENT N/A 09/16/2013   Procedure: INSERTION PORT-A-CATH;  Surgeon: Rolm Bookbinder, MD;  Location: [facility];  Service: General;  Laterality: N/A;  . REMOVAL OF BILATERAL TISSUE EXPANDERS WITH PLACEMENT OF BILATERAL BREAST IMPLANTS Right 01/14/2014   Procedure: REMOVAL OF RIGHT TISSUE EXPANDERS WITH PLACEMENT OF PERMANENT BREAST IMPLANT;  Surgeon: Irene Limbo, MD;  Location: [facility];  Service: Plastics;  Laterality: Right;  . TUBAL LIGATION    . VAGINAL  HYSTERECTOMY      Review of Systems  Constitutional: Positive for diaphoresis and fatigue. Negative for activity change and fever.  HENT: Negative.   Eyes: Negative.   Respiratory: Positive for shortness of breath and wheezing. Negative for cough.   Cardiovascular: Negative.  Negative for chest pain.  Gastrointestinal: Negative.  Negative for abdominal pain.  Endocrine: Positive for heat intolerance. Negative for polydipsia, polyphagia and polyuria.  Genitourinary: Negative.  Negative for dysuria.  Musculoskeletal: Negative.   Skin: Negative.   Neurological: Negative.     Allergies as of 09/21/2016      Reactions   Codeine Nausea And Vomiting   Crestor [rosuvastatin Calcium]    Lipitor [atorvastatin]    Multihance [gadobenate] Nausea Only, Cough   PT HAD INCREASED MUCOUS PRODUCTION AND PHLEGMY COUGH LASTING 10 MINS AFTER INJECTION, PT SENT HOME WITH BENADRYL, PREV NAUSEA AFTER GAD      Medication List       Accurate as of 09/21/16  9:02 AM. Always use your most recent med list.          ALPRAZolam 0.5 MG tablet Commonly known as:  XANAX Take 0.5 mg by mouth 2 (two) times daily as needed for anxiety.   anastrozole 1 MG tablet Commonly known as:  ARIMIDEX TAKE ONE TABLET BY MOUTH ONCE DAILY   budesonide-formoterol 160-4.5 MCG/ACT inhaler Commonly known as:  SYMBICORT Inhale 1 puff into the lungs 2 (two) times daily.   Calcium-Magnesium-Zinc 500-250-12.5 MG Tabs Take by mouth 2 (two) times daily.   citalopram 40 MG tablet Commonly known as:  CELEXA Take 40 mg by mouth daily.   donepezil 10 MG tablet Commonly known as:  ARICEPT Take 10 mg by mouth daily.   gabapentin 300 MG capsule Commonly known as:  NEURONTIN Take 1 capsule (300 mg total) by mouth 2 (two) times daily.   meloxicam 7.5 MG tablet Commonly known as:  MOBIC Take 7.5 mg by mouth daily.   memantine 10 MG tablet Commonly known as:  NAMENDA Take 10 mg by mouth 2 (two) times daily.   omeprazole  20 MG capsule Commonly known as:  PRILOSEC Take 20 mg by mouth daily.   pravastatin 40 MG tablet Commonly known as:  PRAVACHOL Take 1 tablet (40 mg total) by mouth daily.   vitamin B-12 100 MCG tablet Commonly known as:  CYANOCOBALAMIN Take 100 mcg by mouth daily.   Vitamin D-3 5000 units Tabs Take 2 each by mouth daily.          Objective:    BP 121/78   Pulse 81   Temp 97.8 F (36.6 C) (Oral)   Ht 5' 9.2" (1.758 m)   Wt 240 lb 9.6 oz (109.1 kg)   BMI 35.33 kg/m   Allergies  Allergen  Reactions  . Codeine Nausea And Vomiting  . Crestor [Rosuvastatin Calcium]   . Lipitor [Atorvastatin]   . Multihance [Gadobenate] Nausea Only and Cough    PT HAD INCREASED MUCOUS PRODUCTION AND PHLEGMY COUGH LASTING 10 MINS AFTER INJECTION, PT SENT HOME WITH BENADRYL, PREV NAUSEA AFTER GAD    Physical Exam  Constitutional: She is oriented to person, place, and time. She appears well-developed and well-nourished.  HENT:  Head: Normocephalic and atraumatic.  Right Ear: Tympanic membrane, external ear and ear canal normal.  Left Ear: Tympanic membrane, external ear and ear canal normal.  Nose: Nose normal. No rhinorrhea.  Mouth/Throat: Oropharynx is clear and moist and mucous membranes are normal. No oropharyngeal exudate or posterior oropharyngeal erythema.  Eyes: Conjunctivae and EOM are normal. Pupils are equal, round, and reactive to light.  Neck: Normal range of motion. Neck supple.  Cardiovascular: Normal rate, regular rhythm, normal heart sounds and intact distal pulses.   Pulmonary/Chest: Effort normal and breath sounds normal.  Abdominal: Soft. Bowel sounds are normal.  Neurological: She is alert and oriented to person, place, and time. She has normal reflexes.  Skin: Skin is warm and dry. No rash noted.  Psychiatric: She has a normal mood and affect. Her behavior is normal. Judgment and thought content normal.  Nursing note and vitals reviewed.       Assessment & Plan:     1. Reactive airway disease that is not asthma - budesonide-formoterol (SYMBICORT) 160-4.5 MCG/ACT inhaler; Inhale 1 puff into the lungs 2 (two) times daily.  Dispense: 1 Inhaler; Refill: 0  2. Heat intolerance - CBC with Differential/Platelet - Thyroid Panel With TSH  3. Fatigue, unspecified type - CBC with Differential/Platelet - CMP14+EGFR - Thyroid Panel With TSH  4. Pure hypercholesterolemia - CMP14+EGFR - Lipid panel  5. History of breast cancer - CBC with Differential/Platelet - CMP14+EGFR - Thyroid Panel With TSH   Continue all other maintenance medications as listed above.  Follow up plan: Return in about 4 weeks (around 10/19/2016) for recheck.  Orders Placed This Encounter  Procedures  . CBC with Differential/Platelet  . CMP14+EGFR  . Lipid panel  . Thyroid Panel With [facility]    Educational handout given for reactive airway disease  [name] PA-C [facility] [address]  [place], [place] [address] [phone]   09/21/2016, 9:02 AM

## 2016-09-22 LAB — CBC WITH DIFFERENTIAL/PLATELET
BASOS ABS: 0 10*3/uL (ref 0.0–0.2)
BASOS: 1 %
EOS (ABSOLUTE): 0.1 10*3/uL (ref 0.0–0.4)
Eos: 2 %
Hematocrit: 42.3 % (ref 34.0–46.6)
Hemoglobin: 13.7 g/dL (ref 11.1–15.9)
IMMATURE GRANS (ABS): 0 10*3/uL (ref 0.0–0.1)
IMMATURE GRANULOCYTES: 0 %
LYMPHS: 45 %
Lymphocytes Absolute: 2.1 10*3/uL (ref 0.7–3.1)
MCH: 29.3 pg (ref 26.6–33.0)
MCHC: 32.4 g/dL (ref 31.5–35.7)
MCV: 91 fL (ref 79–97)
MONOCYTES: 7 %
Monocytes Absolute: 0.3 10*3/uL (ref 0.1–0.9)
Neutrophils Absolute: 2.1 10*3/uL (ref 1.4–7.0)
Neutrophils: 45 %
PLATELETS: 241 10*3/uL (ref 150–379)
RBC: 4.67 x10E6/uL (ref 3.77–5.28)
RDW: 13.4 % (ref 12.3–15.4)
WBC: 4.7 10*3/uL (ref 3.4–10.8)

## 2016-09-22 LAB — LIPID PANEL
CHOL/HDL RATIO: 4.7 ratio — AB (ref 0.0–4.4)
Cholesterol, Total: 228 mg/dL — ABNORMAL HIGH (ref 100–199)
HDL: 49 mg/dL (ref >39)
LDL CALC: 156 mg/dL — AB (ref 0–99)
Triglycerides: 115 mg/dL (ref 0–149)
VLDL Cholesterol Cal: 23 mg/dL (ref 5–40)

## 2016-09-22 LAB — THYROID PANEL WITH TSH
Free Thyroxine Index: 1.6 (ref 1.2–4.9)
T3 Uptake Ratio: 25 % (ref 24–39)
T4, Total: 6.3 ug/dL (ref 4.5–12.0)
TSH: 1.75 u[IU]/mL (ref 0.450–4.500)

## 2016-09-22 LAB — CMP14+EGFR
ALT: 16 IU/L (ref 0–32)
AST: 18 IU/L (ref 0–40)
Albumin/Globulin Ratio: 1.7 (ref 1.2–2.2)
Albumin: 4.2 g/dL (ref 3.6–4.8)
Alkaline Phosphatase: 145 IU/L — ABNORMAL HIGH (ref 39–117)
BUN/Creatinine Ratio: 23 (ref 12–28)
BUN: 17 mg/dL (ref 8–27)
Bilirubin Total: 0.7 mg/dL (ref 0.0–1.2)
CALCIUM: 9.2 mg/dL (ref 8.7–10.3)
CO2: 26 mmol/L (ref 18–29)
Chloride: 101 mmol/L (ref 96–106)
Creatinine, Ser: 0.73 mg/dL (ref 0.57–1.00)
GFR, EST AFRICAN AMERICAN: 100 mL/min/{1.73_m2} (ref >59)
GFR, EST NON AFRICAN AMERICAN: 87 mL/min/{1.73_m2} (ref >59)
GLUCOSE: 97 mg/dL (ref 65–99)
Globulin, Total: 2.5 g/dL (ref 1.5–4.5)
Potassium: 4.2 mmol/L (ref 3.5–5.2)
Sodium: 141 mmol/L (ref 134–144)
TOTAL PROTEIN: 6.7 g/dL (ref 6.0–8.5)

## 2016-10-03 ENCOUNTER — Other Ambulatory Visit: Payer: Self-pay | Admitting: *Deleted

## 2016-10-04 MED ORDER — MELOXICAM 7.5 MG PO TABS
7.5000 mg | ORAL_TABLET | Freq: Every day | ORAL | 5 refills | Status: DC
Start: 2016-10-04 — End: 2017-07-16

## 2016-10-14 ENCOUNTER — Other Ambulatory Visit: Payer: Self-pay | Admitting: Physician Assistant

## 2016-10-24 ENCOUNTER — Other Ambulatory Visit: Payer: Self-pay

## 2016-10-24 MED ORDER — ALPRAZOLAM 0.5 MG PO TABS: 0.5000 mg | ORAL_TABLET | Freq: Two times a day (BID) | ORAL | 2 refills | Status: DC | PRN

## 2016-10-24 NOTE — Telephone Encounter (Signed)
Refill called to [facility] 

## 2016-11-01 ENCOUNTER — Telehealth: Payer: Self-pay | Admitting: Physician Assistant

## 2016-11-01 DIAGNOSIS — J989 Respiratory disorder, unspecified: Secondary | ICD-10-CM

## 2016-11-01 DIAGNOSIS — R0989 Other specified symptoms and signs involving the circulatory and respiratory systems: Secondary | ICD-10-CM

## 2016-11-01 MED ORDER — BUDESONIDE-FORMOTEROL FUMARATE 160-4.5 MCG/ACT IN AERO: 1.0000 | INHALATION_SPRAY | Freq: Two times a day (BID) | RESPIRATORY_TRACT | 3 refills | Status: DC

## 2016-11-01 NOTE — Telephone Encounter (Signed)
What is the name of the medication? symbicort inhaler  Have you contacted your pharmacy to request a refill? Yes but told pt to call us  Which pharmacy would you like this sent to? [facility]   Patient notified that their request is being sent to the clinical staff for review and that they should receive a call once it is complete. If they do not receive a call within 24 hours they can check with their pharmacy or our office.

## 2016-11-01 NOTE — Telephone Encounter (Signed)
Patient aware that rx sent to pharmacy. 

## 2016-11-03 ENCOUNTER — Telehealth: Payer: Self-pay | Admitting: Physician Assistant

## 2016-11-03 NOTE — Telephone Encounter (Signed)
Please advise 

## 2016-11-04 MED ORDER — FLUTICASONE-SALMETEROL 250-50 MCG/DOSE IN AEPB: 1.0000 | INHALATION_SPRAY | Freq: Two times a day (BID) | RESPIRATORY_TRACT | 3 refills | Status: DC

## 2016-11-04 NOTE — Telephone Encounter (Signed)
Pt does have to meet a deductible  Advair sent into [facility]

## 2016-11-04 NOTE — Telephone Encounter (Signed)
Does she have a deductible she has to meet on her medications? If not, may need to try Advair 250/50 one inh BID, then maybe Breo if that's not covered.

## 2016-11-28 ENCOUNTER — Other Ambulatory Visit: Payer: Self-pay | Admitting: Physician Assistant

## 2016-12-08 ENCOUNTER — Encounter: Payer: Self-pay | Admitting: Hematology and Oncology

## 2016-12-08 ENCOUNTER — Ambulatory Visit (HOSPITAL_BASED_OUTPATIENT_CLINIC_OR_DEPARTMENT_OTHER): Payer: 59 | Admitting: Hematology and Oncology

## 2016-12-08 DIAGNOSIS — R635 Abnormal weight gain: Secondary | ICD-10-CM

## 2016-12-08 DIAGNOSIS — Z170 Estrogen receptor positive status [ER+]: Secondary | ICD-10-CM | POA: Diagnosis not present

## 2016-12-08 DIAGNOSIS — N951 Menopausal and female climacteric states: Secondary | ICD-10-CM

## 2016-12-08 DIAGNOSIS — C50411 Malignant neoplasm of upper-outer quadrant of right female breast: Secondary | ICD-10-CM

## 2016-12-08 DIAGNOSIS — R5383 Other fatigue: Secondary | ICD-10-CM

## 2016-12-08 DIAGNOSIS — M545 Low back pain: Secondary | ICD-10-CM | POA: Diagnosis not present

## 2016-12-08 NOTE — Assessment & Plan Note (Signed)
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 ER 99% PR 99% HER-2 negative: Intermediate risk Oncotype DX recurrence score 23. Patient received 4 cycles of Taxotere Cytoxan chemotherapy. She had breast reconstruction subsequently in May 2015, started Arimidex May 2015  Surveillance:  1. Breast exam 12/08/2016 did not reveal any lumps or nodules 2. Mammogram 06/16/2016 is normal density category B 3. Bone density April 2015 showed T score -0.6  Arimidex toxicities: 1. Hot flashes accompanied by sweating especially in hot weather 2. Fatigue Right leg and back pain: Could be arthritis versus bursitis   Low back pain: Patient has seen a chiropractor. Return to clinic 1 year for follow-up

## 2016-12-08 NOTE — Progress Notes (Signed)
Patient Care Team: [name], PA-C as PCP - General ([place]) Amada Kingfisher, MD (Inactive) as Consulting Physician (Hematology and Oncology) Danie Binder, MD as Consulting Physician (Gastroenterology)  DIAGNOSIS:  Encounter Diagnosis  Name Primary?  . Malignant neoplasm of upper-outer quadrant of right breast in female, estrogen receptor positive ([place])     SUMMARY OF ONCOLOGIC HISTORY:   Breast cancer of upper-outer quadrant of right female breast ([place])   04/29/2013 Mammogram    Right breast mass, 7 mm lesion at 9:00 position, IDC with DCIS grade 2 ER  PR 99% positive HER-2 negative Ki-67 11%: Second mass IDC ER/PR positive HER-2 negative Ki-67 11%, MRI 6 cm including enhancement       07/12/2013 Surgery    Right breast mastectomy: IDC grade 1, 2 foci 1.2 and 1.1 cm, LVI positive, 2 SLN negative, Oncotype DX score 23 intermediate risk, ROR. 14%      09/26/2013 - 11/28/2013 Chemotherapy    Adjuvant Taxotere and Cytoxan x4 cycles      01/14/2014 Surgery    Right breast reconstruction and right port removal      01/17/2014 -  Anti-estrogen oral therapy    anastrozole 1 mg daily       CHIEF COMPLIANT: Continues to have profound hot flashes, recent weight gain  INTERVAL HISTORY: Morgan Santos is a 66 year old with above-mentioned history of right breast cancer currently on adjuvant antiestrogen therapy with anastrozole. She is continuing to have hot flashes which appear to be bothering her. She is also recently had weight gain issues. Denies any lumps or nodules in the breast. Last mammograms in October were normal.  REVIEW OF SYSTEMS:   Constitutional: Denies fevers, chills or abnormal weight loss Eyes: Denies blurriness of vision Ears, nose, mouth, throat, and face: Denies mucositis or sore throat Respiratory: Denies cough, dyspnea or wheezes Cardiovascular: Denies palpitation, chest discomfort Gastrointestinal:  Denies nausea, heartburn or change in bowel  habits Skin: Denies abnormal skin rashes Lymphatics: Denies new lymphadenopathy or easy bruising Neurological:Denies numbness, tingling or new weaknesses Behavioral/Psych: Mood is stable, no new changes  Extremities: No lower extremity edema Breast:  denies any pain or lumps or nodules in either breasts All other systems were reviewed with the patient and are negative.  I have reviewed the past medical history, past surgical history, social history and family history with the patient and they are unchanged from previous note.  ALLERGIES:  is allergic to codeine; crestor [rosuvastatin calcium]; lipitor [atorvastatin]; and multihance [gadobenate].  MEDICATIONS:  Current Outpatient Prescriptions  Medication Sig Dispense Refill  . ALPRAZolam (XANAX) 0.5 MG tablet Take 1 tablet (0.5 mg total) by mouth 2 (two) times daily as needed for anxiety. 60 tablet 2  . anastrozole (ARIMIDEX) 1 MG tablet TAKE ONE TABLET BY MOUTH ONCE DAILY 90 tablet 3  . budesonide-formoterol (SYMBICORT) 160-4.5 MCG/ACT inhaler Inhale 1 puff into the lungs 2 (two) times daily. 1 Inhaler 3  . Calcium-Magnesium-Zinc 500-250-12.5 MG TABS Take by mouth 2 (two) times daily.    . Cholecalciferol (VITAMIN D-3) 5000 UNITS TABS Take 2 each by mouth daily.    . citalopram (CELEXA) 40 MG tablet Take 40 mg by mouth daily.  11  . donepezil (ARICEPT) 10 MG tablet Take 10 mg by mouth daily.    . Fluticasone-Salmeterol (ADVAIR DISKUS) 250-50 MCG/DOSE AEPB Inhale 1 puff into the lungs 2 (two) times daily. 1 each 3  . gabapentin (NEURONTIN) 300 MG capsule TAKE 1 CAPSULE BY MOUTH TWICE  DAILY 60 capsule 0  . meloxicam (MOBIC) 7.5 MG tablet Take 1 tablet (7.5 mg total) by mouth daily. 30 tablet 5  . memantine (NAMENDA) 10 MG tablet Take 10 mg by mouth 2 (two) times daily.    Marland Kitchen omeprazole (PRILOSEC) 20 MG capsule Take 20 mg by mouth daily.     . pravastatin (PRAVACHOL) 40 MG tablet Take 1 tablet (40 mg total) by mouth daily. 30 tablet 5  .  vitamin B-12 (CYANOCOBALAMIN) 100 MCG tablet Take 100 mcg by mouth daily.     No current facility-administered medications for this visit.     PHYSICAL EXAMINATION: ECOG PERFORMANCE STATUS: 1 - Symptomatic but completely ambulatory  Vitals:   12/08/16 1434  BP: (!) 142/50  Pulse: (!) 58  Resp: 18  Temp: 98.1 F (36.7 C)   Filed Weights   12/08/16 1434  Weight: 242 lb 6.4 oz (110 kg)    GENERAL:alert, no distress and comfortable SKIN: skin color, texture, turgor are normal, no rashes or significant lesions EYES: normal, Conjunctiva are pink and non-injected, sclera clear OROPHARYNX:no exudate, no erythema and lips, buccal mucosa, and tongue normal  NECK: supple, thyroid normal size, non-tender, without nodularity LYMPH:  no palpable lymphadenopathy in the cervical, axillary or inguinal LUNGS: clear to auscultation and percussion with normal breathing effort HEART: regular rate & rhythm and no murmurs and no lower extremity edema ABDOMEN:abdomen soft, non-tender and normal bowel sounds MUSCULOSKELETAL:no cyanosis of digits and no clubbing  NEURO: alert & oriented x 3 with fluent speech, no focal motor/sensory deficits EXTREMITIES: No lower extremity edema BREAST: No palpable masses or nodules in either right or left breasts. No palpable axillary supraclavicular or infraclavicular adenopathy no breast tenderness or nipple discharge. (exam performed in the presence of a chaperone)  LABORATORY DATA:  I have reviewed the data as listed   Chemistry      Component Value Date/Time   NA 141 09/21/2016 0853   NA 141 12/09/2014 1317   K 4.2 09/21/2016 0853   K 4.1 12/09/2014 1317   CL 101 09/21/2016 0853   CO2 26 09/21/2016 0853   CO2 23 12/09/2014 1317   BUN 17 09/21/2016 0853   BUN 15.3 12/09/2014 1317   CREATININE 0.73 09/21/2016 0853   CREATININE 0.7 12/09/2014 1317      Component Value Date/Time   CALCIUM 9.2 09/21/2016 0853   CALCIUM 9.2 12/09/2014 1317   ALKPHOS 145  (H) 09/21/2016 0853   ALKPHOS 141 12/09/2014 1317   AST 18 09/21/2016 0853   AST 26 12/09/2014 1317   ALT 16 09/21/2016 0853   ALT 25 12/09/2014 1317   BILITOT 0.7 09/21/2016 0853   BILITOT 0.62 12/09/2014 1317       Lab Results  Component Value Date   WBC 4.7 09/21/2016   HGB 13.8 12/09/2014   HCT 42.3 09/21/2016   MCV 91 09/21/2016   PLT 241 09/21/2016   NEUTROABS 2.1 09/21/2016    ASSESSMENT & PLAN:  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 ER 99% PR 99% HER-2 negative: Intermediate risk Oncotype DX recurrence score 23. Patient received 4 cycles of Taxotere Cytoxan chemotherapy. She had breast reconstruction subsequently in May 2015, started Arimidex May 2015  Surveillance:  1. Breast exam 12/08/2016 did not reveal any lumps or nodules 2. Mammogram 06/16/2016 is normal density category B 3. Bone density April 2015  showed T score -0.6 We will need to obtain another bone density  Arimidex toxicities: 1. Hot flashes accompanied by sweating especially in hot weather 2. Fatigue 3. Weight gain: I encouraged her to join [employer] program Right leg and back pain: Could be arthritis versus bursitis   Low back pain: Patient has seen a chiropractor. Return to clinic 1 year for follow-up  I spent 25 minutes talking to the patient of which more than half was spent in counseling and coordination of care.  No orders of the defined types were placed in this encounter.  The patient has a good understanding of the overall plan. she agrees with it. she will call with any problems that may develop before the next visit here.   Rulon Eisenmenger, MD 12/08/16

## 2017-01-03 ENCOUNTER — Other Ambulatory Visit: Payer: Self-pay | Admitting: Physician Assistant

## 2017-01-19 DIAGNOSIS — E538 Deficiency of other specified B group vitamins: Secondary | ICD-10-CM | POA: Diagnosis not present

## 2017-01-19 DIAGNOSIS — F0390 Unspecified dementia without behavioral disturbance: Secondary | ICD-10-CM | POA: Diagnosis not present

## 2017-02-17 ENCOUNTER — Other Ambulatory Visit: Payer: Self-pay | Admitting: Physician Assistant

## 2017-03-12 DIAGNOSIS — S46012A Strain of muscle(s) and tendon(s) of the rotator cuff of left shoulder, initial encounter: Secondary | ICD-10-CM | POA: Diagnosis not present

## 2017-03-12 DIAGNOSIS — Z9011 Acquired absence of right breast and nipple: Secondary | ICD-10-CM | POA: Diagnosis not present

## 2017-03-12 DIAGNOSIS — Z853 Personal history of malignant neoplasm of breast: Secondary | ICD-10-CM | POA: Diagnosis not present

## 2017-03-12 DIAGNOSIS — M542 Cervicalgia: Secondary | ICD-10-CM | POA: Diagnosis not present

## 2017-03-12 DIAGNOSIS — M79602 Pain in left arm: Secondary | ICD-10-CM | POA: Diagnosis not present

## 2017-03-12 DIAGNOSIS — W1839XA Other fall on same level, initial encounter: Secondary | ICD-10-CM | POA: Diagnosis not present

## 2017-03-12 DIAGNOSIS — R531 Weakness: Secondary | ICD-10-CM | POA: Diagnosis not present

## 2017-03-12 DIAGNOSIS — I10 Essential (primary) hypertension: Secondary | ICD-10-CM | POA: Diagnosis not present

## 2017-03-12 DIAGNOSIS — F0390 Unspecified dementia without behavioral disturbance: Secondary | ICD-10-CM | POA: Diagnosis not present

## 2017-03-12 DIAGNOSIS — R51 Headache: Secondary | ICD-10-CM | POA: Diagnosis not present

## 2017-03-13 ENCOUNTER — Ambulatory Visit (INDEPENDENT_AMBULATORY_CARE_PROVIDER_SITE_OTHER): Payer: Commercial Indemnity | Admitting: Physician Assistant

## 2017-03-13 ENCOUNTER — Encounter: Payer: Self-pay | Admitting: Physician Assistant

## 2017-03-13 VITALS — BP 178/120 | HR 67 | Temp 98.4°F | Ht 69.2 in | Wt 242.0 lb

## 2017-03-13 DIAGNOSIS — M67912 Unspecified disorder of synovium and tendon, left shoulder: Secondary | ICD-10-CM | POA: Insufficient documentation

## 2017-03-13 DIAGNOSIS — I10 Essential (primary) hypertension: Secondary | ICD-10-CM | POA: Diagnosis not present

## 2017-03-13 MED ORDER — LISINOPRIL 10 MG PO TABS: 10.0000 mg | ORAL_TABLET | Freq: Every day | ORAL | 0 refills | Status: DC

## 2017-03-13 MED ORDER — HYDROCODONE-ACETAMINOPHEN 10-325 MG PO TABS: 1.0000 | ORAL_TABLET | Freq: Four times a day (QID) | ORAL | 0 refills | Status: DC | PRN

## 2017-03-13 MED ORDER — PREDNISONE 10 MG (48) PO TBPK
ORAL_TABLET | ORAL | 0 refills | Status: DC
Start: 2017-03-13 — End: 2017-07-26

## 2017-03-13 NOTE — Patient Instructions (Signed)
DASH Eating Plan DASH stands for "Dietary Approaches to Stop Hypertension." The DASH eating plan is a healthy eating plan that has been shown to reduce high blood pressure (hypertension). It may also reduce your risk for type 2 diabetes, heart disease, and stroke. The DASH eating plan may also help with weight loss. What are tips for following this plan? General guidelines  Avoid eating more than 2,300 mg (milligrams) of salt (sodium) a day. If you have hypertension, you may need to reduce your sodium intake to 1,500 mg a day.  Limit alcohol intake to no more than 1 drink a day for nonpregnant women and 2 drinks a day for men. One drink equals 12 oz of beer, 5 oz of wine, or 1 oz of hard liquor.  Work with your health care provider to maintain a healthy body weight or to lose weight. Ask what an ideal weight is for you.  Get at least 30 minutes of exercise that causes your heart to beat faster (aerobic exercise) most days of the week. Activities may include walking, swimming, or biking.  Work with your health care provider or diet and nutrition specialist (dietitian) to adjust your eating plan to your individual calorie needs. Reading food labels  Check food labels for the amount of sodium per serving. Choose foods with less than 5 percent of the Daily Value of sodium. Generally, foods with less than 300 mg of sodium per serving fit into this eating plan.  To find whole grains, look for the word "whole" as the first word in the ingredient list. Shopping  Buy products labeled as "low-sodium" or "no salt added."  Buy fresh foods. Avoid canned foods and premade or frozen meals. Cooking  Avoid adding salt when cooking. Use salt-free seasonings or herbs instead of table salt or sea salt. Check with your health care provider or pharmacist before using salt substitutes.  Do not fry foods. Cook foods using healthy methods such as baking, boiling, grilling, and broiling instead.  Cook with  heart-healthy oils, such as olive, canola, soybean, or sunflower oil. Meal planning   Eat a balanced diet that includes: ? 5 or more servings of fruits and vegetables each day. At each meal, try to fill half of your plate with fruits and vegetables. ? Up to 6-8 servings of whole grains each day. ? Less than 6 oz of lean meat, poultry, or fish each day. A 3-oz serving of meat is about the same size as a deck of cards. One egg equals 1 oz. ? 2 servings of low-fat dairy each day. ? A serving of nuts, seeds, or beans 5 times each week. ? Heart-healthy fats. Healthy fats called Omega-3 fatty acids are found in foods such as flaxseeds and coldwater fish, like sardines, salmon, and mackerel.  Limit how much you eat of the following: ? Canned or prepackaged foods. ? Food that is high in trans fat, such as fried foods. ? Food that is high in saturated fat, such as fatty meat. ? Sweets, desserts, sugary drinks, and other foods with added sugar. ? Full-fat dairy products.  Do not salt foods before eating.  Try to eat at least 2 vegetarian meals each week.  Eat more home-cooked food and less restaurant, buffet, and fast food.  When eating at a restaurant, ask that your food be prepared with less salt or no salt, if possible. What foods are recommended? The items listed may not be a complete list. Talk with your dietitian about what   dietary choices are best for you. Grains Whole-grain or whole-wheat bread. Whole-grain or whole-wheat pasta. Brown rice. Oatmeal. Quinoa. Bulgur. Whole-grain and low-sodium cereals. Pita bread. Low-fat, low-sodium crackers. Whole-wheat flour tortillas. Vegetables Fresh or frozen vegetables (raw, steamed, roasted, or grilled). Low-sodium or reduced-sodium tomato and vegetable juice. Low-sodium or reduced-sodium tomato sauce and tomato paste. Low-sodium or reduced-sodium canned vegetables. Fruits All fresh, dried, or frozen fruit. Canned fruit in natural juice (without  added sugar). Meat and other protein foods Skinless chicken or turkey. Ground chicken or turkey. Pork with fat trimmed off. Fish and seafood. Egg whites. Dried beans, peas, or lentils. Unsalted nuts, nut butters, and seeds. Unsalted canned beans. Lean cuts of beef with fat trimmed off. Low-sodium, lean deli meat. Dairy Low-fat (1%) or fat-free (skim) milk. Fat-free, low-fat, or reduced-fat cheeses. Nonfat, low-sodium ricotta or cottage cheese. Low-fat or nonfat yogurt. Low-fat, low-sodium cheese. Fats and oils Soft margarine without trans fats. Vegetable oil. Low-fat, reduced-fat, or light mayonnaise and salad dressings (reduced-sodium). Canola, safflower, olive, soybean, and sunflower oils. Avocado. Seasoning and other foods Herbs. Spices. Seasoning mixes without salt. Unsalted popcorn and pretzels. Fat-free sweets. What foods are not recommended? The items listed may not be a complete list. Talk with your dietitian about what dietary choices are best for you. Grains Baked goods made with fat, such as croissants, muffins, or some breads. Dry pasta or rice meal packs. Vegetables Creamed or fried vegetables. Vegetables in a cheese sauce. Regular canned vegetables (not low-sodium or reduced-sodium). Regular canned tomato sauce and paste (not low-sodium or reduced-sodium). Regular tomato and vegetable juice (not low-sodium or reduced-sodium). Pickles. Olives. Fruits Canned fruit in a light or heavy syrup. Fried fruit. Fruit in cream or butter sauce. Meat and other protein foods Fatty cuts of meat. Ribs. Fried meat. Bacon. Sausage. Bologna and other processed lunch meats. Salami. Fatback. Hotdogs. Bratwurst. Salted nuts and seeds. Canned beans with added salt. Canned or smoked fish. Whole eggs or egg yolks. Chicken or turkey with skin. Dairy Whole or 2% milk, cream, and half-and-half. Whole or full-fat cream cheese. Whole-fat or sweetened yogurt. Full-fat cheese. Nondairy creamers. Whipped toppings.  Processed cheese and cheese spreads. Fats and oils Butter. Stick margarine. Lard. Shortening. Ghee. Bacon fat. Tropical oils, such as coconut, palm kernel, or palm oil. Seasoning and other foods Salted popcorn and pretzels. Onion salt, garlic salt, seasoned salt, table salt, and sea salt. Worcestershire sauce. Tartar sauce. Barbecue sauce. Teriyaki sauce. Soy sauce, including reduced-sodium. Steak sauce. Canned and packaged gravies. Fish sauce. Oyster sauce. Cocktail sauce. Horseradish that you find on the shelf. Ketchup. Mustard. Meat flavorings and tenderizers. Bouillon cubes. Hot sauce and Tabasco sauce. Premade or packaged marinades. Premade or packaged taco seasonings. Relishes. Regular salad dressings. Where to find more information:  National Heart, Lung, and Blood Institute: www.nhlbi.nih.gov  American Heart Association: www.heart.org Summary  The DASH eating plan is a healthy eating plan that has been shown to reduce high blood pressure (hypertension). It may also reduce your risk for type 2 diabetes, heart disease, and stroke.  With the DASH eating plan, you should limit salt (sodium) intake to 2,300 mg a day. If you have hypertension, you may need to reduce your sodium intake to 1,500 mg a day.  When on the DASH eating plan, aim to eat more fresh fruits and vegetables, whole grains, lean proteins, low-fat dairy, and heart-healthy fats.  Work with your health care provider or diet and nutrition specialist (dietitian) to adjust your eating plan to your individual   calorie needs. This information is not intended to replace advice given to you by your health care provider. Make sure you discuss any questions you have with your health care provider. Document Released: 07/28/2011 Document Revised: 08/01/2016 Document Reviewed: 08/01/2016 [employer] Interactive Patient Education  2017 [employer]  

## 2017-03-13 NOTE — Progress Notes (Signed)
BP (!) 178/120   Pulse 67   Temp 98.4 F (36.9 C) (Oral)   Ht 5' 9.2" (1.758 m)   Wt 242 lb (109.8 kg)   BMI 35.53 kg/m    Subjective:    Patient ID: Morgan Santos, female    DOB: [birth year], 66 y.o.   MRN: [id number]  HPI: Morgan Santos is a 66 y.o. female presenting on 03/13/2017 for Arm Pain (left arm pain. Went to ER last night. Was told possible rotator cuff)  Patient has had trouble with her left arm off and on for a few years now. She was given ago to an orthopedist for an evaluation but it seemed to get better. However in the past couple months she has had a great increase in shoulder pain. She has decreased range of motion. She has decreased strength. She is trying to help Someone and it pulled on it and she is in a significant amount of pain now. She was seen in the [facility] ER on 03/12/2017. The blood pressure was elevated at that time also. She has been taking prescription NSAIDs and over-the-counter naproxen and ibuprofen. I am having her stop all NSAIDs at this time.  Relevant past medical, surgical, family and social history reviewed and updated as indicated. Allergies and medications reviewed and updated.  Past Medical History:  Diagnosis Date  . Anemia    when she was younger  . Anxiety   . Arthritis    hip- R, back; osteoarthritis  . Breast cancer ([place])   . Complication of anesthesia   . Constipation   . Depression    pt. remarks that she "takes respridal because if I don't take it I get angry"   . GERD (gastroesophageal reflux disease)    pt. uses vinegar for heartburn, ususally once per week   . H/O degenerative disc disease   . Hyperlipidemia   . Hypertension    "when I get upset"  Not on medication  . PONV (postoperative nausea and vomiting)    "just for one day"  . Stroke [facility]) ?2010 or 2011   2010, [facility] for stroke "mini stroke" short term memory problems since  . Wears dentures    top  . Wears glasses     Past Surgical History:    Procedure Laterality Date  . BREAST RECONSTRUCTION WITH PLACEMENT OF TISSUE EXPANDER AND FLEX HD (ACELLULAR HYDRATED DERMIS) Right 07/12/2013   Procedure: RIGHT BREAST RECONSTRUCTION WITH PLACEMENT OF TISSUE EXPANDER AND FLEX HD TO RIGHT BREAST (ACELLULAR HYDRATED DERMIS);  Surgeon: Irene Limbo, MD;  Location: [place];  Service: Plastics;  Laterality: Right;  . BREAST SURGERY     for abcesses- removed from both breasts, many yrs. ago  . CHOLECYSTECTOMY    . COLONOSCOPY   01/29/2004   NUR:A 7-8 mm polyp snared from the hepatic flexure.  Another 3 mm polyp was     cold snared from the sigmoid colon.External hemorrhoids, possible source of recent rectal bleeding  . COLONOSCOPY N/A 06/05/2014   Procedure: COLONOSCOPY;  Surgeon: Danie Binder, MD;  [facility];  Service: Endoscopy;  Laterality: N/A;  1030 - moved to 10:45 - Ginger to notify pt   . DILATION AND CURETTAGE OF UTERUS    . LUMBAR SPINE SURGERY    . MASTECTOMY    . MASTECTOMY W/ SENTINEL NODE BIOPSY Right 07/12/2013   Procedure: MASTECTOMY WITH SENTINEL LYMPH NODE BIOPSY;  Surgeon: Rolm Bookbinder, MD;  Location: [place];  Service: General;  Laterality: Right;  . MASTOPEXY Left 01/14/2014   Procedure: LEFT BREAST MASTOPEXY FOR SYMMETRY ;  Surgeon: Irene Limbo, MD;  Location: [place];  Service: Plastics;  Laterality: Left;  . PORT-A-CATH REMOVAL Left 01/14/2014   Procedure: REMOVAL PORT-A-CATH;  Surgeon: Irene Limbo, MD;  Location: [place];  Service: Plastics;  Laterality: Left;  . PORTACATH PLACEMENT N/A 09/16/2013   Procedure: INSERTION PORT-A-CATH;  Surgeon: Rolm Bookbinder, MD;  Location: [place];  Service: General;  Laterality: N/A;  . REMOVAL OF BILATERAL TISSUE EXPANDERS WITH PLACEMENT OF BILATERAL BREAST IMPLANTS Right 01/14/2014   Procedure: REMOVAL OF RIGHT TISSUE EXPANDERS WITH PLACEMENT OF PERMANENT BREAST IMPLANT;  Surgeon: Irene Limbo, MD;  Location: [place];  Service: Plastics;  Laterality: Right;  . TUBAL LIGATION    . VAGINAL HYSTERECTOMY      Review of Systems  Constitutional: Negative.  Negative for activity change, fatigue and fever.  HENT: Negative.   Eyes: Negative.   Respiratory: Negative.  Negative for cough.   Cardiovascular: Negative.  Negative for chest pain.  Gastrointestinal: Negative.  Negative for abdominal pain.  Endocrine: Negative.   Genitourinary: Negative.  Negative for dysuria.  Musculoskeletal: Positive for arthralgias, joint swelling and myalgias.  Skin: Negative.   Neurological: Negative.     Allergies as of 03/13/2017      Reactions   Codeine Nausea And Vomiting   Crestor [rosuvastatin Calcium]    Lipitor [atorvastatin]    Multihance [gadobenate] Nausea Only, Cough   PT HAD INCREASED MUCOUS PRODUCTION AND PHLEGMY COUGH LASTING 10 MINS AFTER INJECTION, PT SENT HOME WITH BENADRYL, PREV NAUSEA AFTER GAD      Medication List       Accurate as of 03/13/17 12:24 PM. Always use your most recent med list.          ALPRAZolam 0.5 MG tablet Commonly known as:  XANAX Take 1 tablet (0.5 mg total) by mouth 2 (two) times daily as needed for anxiety.   anastrozole 1 MG tablet Commonly known as:  ARIMIDEX TAKE ONE TABLET BY MOUTH ONCE DAILY   budesonide-formoterol 160-4.5 MCG/ACT inhaler Commonly known as:  SYMBICORT Inhale 1 puff into the lungs 2 (two) times daily.   Calcium-Magnesium-Zinc 500-250-12.5 MG Tabs Take by mouth 2 (two) times daily.   citalopram 40 MG tablet Commonly known as:  CELEXA Take 40 mg by mouth daily.   donepezil 10 MG tablet Commonly known as:  ARICEPT Take 10 mg by mouth daily.   Fluticasone-Salmeterol 250-50 MCG/DOSE Aepb Commonly known as:  ADVAIR DISKUS Inhale 1 puff into the lungs 2 (two) times daily.   gabapentin 300 MG capsule Commonly known as:  NEURONTIN TAKE 1 CAPSULE BY MOUTH TWICE DAILY   HYDROcodone-acetaminophen 10-325 MG tablet Commonly  known as:  NORCO Take 1-2 tablets by mouth every 6 (six) hours as needed.   lisinopril 10 MG tablet Commonly known as:  PRINIVIL,ZESTRIL Take 1 tablet (10 mg total) by mouth daily.   meloxicam 7.5 MG tablet Commonly known as:  MOBIC Take 1 tablet (7.5 mg total) by mouth daily.   memantine 10 MG tablet Commonly known as:  NAMENDA Take 10 mg by mouth 2 (two) times daily.   omeprazole 20 MG capsule Commonly known as:  PRILOSEC Take 20 mg by mouth daily.   pravastatin 40 MG tablet Commonly known as:  PRAVACHOL Take 1 tablet (40 mg total) by mouth daily.   predniSONE 10 MG (48) Tbpk  tablet Commonly known as:  STERAPRED UNI-PAK 48 TAB Take as directed for 12 days   vitamin B-12 100 MCG tablet Commonly known as:  CYANOCOBALAMIN Take 100 mcg by mouth daily.   Vitamin D-3 5000 units Tabs Take 2 each by mouth daily.          Objective:    BP (!) 178/120   Pulse 67   Temp 98.4 F (36.9 C) (Oral)   Ht 5' 9.2" (1.758 m)   Wt 242 lb (109.8 kg)   BMI 35.53 kg/m   Allergies  Allergen Reactions  . Codeine Nausea And Vomiting  . Crestor [Rosuvastatin Calcium]   . Lipitor [Atorvastatin]   . Multihance [Gadobenate] Nausea Only and Cough    PT HAD INCREASED MUCOUS PRODUCTION AND PHLEGMY COUGH LASTING 10 MINS AFTER INJECTION, PT SENT HOME WITH BENADRYL, PREV NAUSEA AFTER GAD    Physical Exam  Constitutional: She is oriented to person, place, and time. She appears well-developed and well-nourished.  HENT:  Head: Normocephalic and atraumatic.  Right Ear: Tympanic membrane, external ear and ear canal normal.  Left Ear: Tympanic membrane, external ear and ear canal normal.  Nose: Nose normal. No rhinorrhea.  Mouth/Throat: Oropharynx is clear and moist and mucous membranes are normal. No oropharyngeal exudate or posterior oropharyngeal erythema.  Eyes: Pupils are equal, round, and reactive to light. Conjunctivae and EOM are normal.  Neck: Normal range of motion. Neck supple.    Cardiovascular: Normal rate, regular rhythm, normal heart sounds and intact distal pulses.   Pulmonary/Chest: Effort normal and breath sounds normal.  Abdominal: Soft. Bowel sounds are normal.  Musculoskeletal:       Left shoulder: She exhibits decreased range of motion, tenderness, pain and decreased strength. She exhibits no crepitus.  Neurological: She is alert and oriented to person, place, and time. She has normal reflexes.  Skin: Skin is warm and dry. No rash noted.  Psychiatric: She has a normal mood and affect. Her behavior is normal. Judgment and thought content normal.    Results for orders placed or performed in visit on 09/21/16  CBC with Differential/Platelet  Result Value Ref Range   WBC 4.7 3.4 - 10.8 x10E3/uL   RBC 4.67 3.77 - 5.28 x10E6/uL   Hemoglobin 13.7 11.1 - 15.9 g/dL   Hematocrit 42.3 34.0 - 46.6 %   MCV 91 79 - 97 fL   MCH 29.3 26.6 - 33.0 pg   MCHC 32.4 31.5 - 35.7 g/dL   RDW 13.4 12.3 - 15.4 %   Platelets 241 150 - 379 x10E3/uL   Neutrophils 45 Not Estab. %   Lymphs 45 Not Estab. %   Monocytes 7 Not Estab. %   Eos 2 Not Estab. %   Basos 1 Not Estab. %   Neutrophils Absolute 2.1 1.4 - 7.0 x10E3/uL   Lymphocytes Absolute 2.1 0.7 - 3.1 x10E3/uL   Monocytes Absolute 0.3 0.1 - 0.9 x10E3/uL   EOS (ABSOLUTE) 0.1 0.0 - 0.4 x10E3/uL   Basophils Absolute 0.0 0.0 - 0.2 x10E3/uL   Immature Granulocytes 0 Not Estab. %   Immature Grans (Abs) 0.0 0.0 - 0.1 x10E3/uL  CMP14+EGFR  Result Value Ref Range   Glucose 97 65 - 99 mg/dL   BUN 17 8 - 27 mg/dL   Creatinine, Ser 0.73 0.57 - 1.00 mg/dL   GFR calc non Af Amer 87 >59 mL/min/1.73   GFR calc Af Amer 100 >59 mL/min/1.73   BUN/Creatinine Ratio 23 12 - 28   Sodium  141 134 - 144 mmol/L   Potassium 4.2 3.5 - 5.2 mmol/L   Chloride 101 96 - 106 mmol/L   CO2 26 18 - 29 mmol/L   Calcium 9.2 8.7 - 10.3 mg/dL   Total Protein 6.7 6.0 - 8.5 g/dL   Albumin 4.2 3.6 - 4.8 g/dL   Globulin, Total 2.5 1.5 - 4.5 g/dL    Albumin/Globulin Ratio 1.7 1.2 - 2.2   Bilirubin Total 0.7 0.0 - 1.2 mg/dL   Alkaline Phosphatase 145 (H) 39 - 117 IU/L   AST 18 0 - 40 IU/L   ALT 16 0 - 32 IU/L  Lipid panel  Result Value Ref Range   Cholesterol, Total 228 (H) 100 - 199 mg/dL   Triglycerides 115 0 - 149 mg/dL   HDL 49 >39 mg/dL   VLDL Cholesterol Cal 23 5 - 40 mg/dL   LDL Calculated 156 (H) 0 - 99 mg/dL   Chol/HDL Ratio 4.7 (H) 0.0 - 4.4 ratio units  Thyroid Panel With TSH  Result Value Ref Range   TSH 1.750 0.450 - 4.500 uIU/mL   T4, Total 6.3 4.5 - 12.0 ug/dL   T3 Uptake Ratio 25 24 - 39 %   Free Thyroxine Index 1.6 1.2 - 4.9      Assessment & Plan:   1. Disorder of left rotator cuff - predniSONE (STERAPRED UNI-PAK 48 TAB) 10 MG (48) TBPK tablet; Take as directed for 12 days  Dispense: 48 tablet; Refill: 0 - HYDROcodone-acetaminophen (NORCO) 10-325 MG tablet; Take 1-2 tablets by mouth every 6 (six) hours as needed.  Dispense: 40 tablet; Refill: 0 - Ambulatory referral to Orthopedic Surgery  2. Essential hypertension HOLD all NSAIDs due to elevated blood pressure. Patient verbalizes understanding - lisinopril (PRINIVIL,ZESTRIL) 10 MG tablet; Take 1 tablet (10 mg total) by mouth daily.  Dispense: 90 tablet; Refill: 0   Current Outpatient Prescriptions:  .  ALPRAZolam (XANAX) 0.5 MG tablet, Take 1 tablet (0.5 mg total) by mouth 2 (two) times daily as needed for anxiety., Disp: 60 tablet, Rfl: 2 .  anastrozole (ARIMIDEX) 1 MG tablet, TAKE ONE TABLET BY MOUTH ONCE DAILY, Disp: 90 tablet, Rfl: 3 .  budesonide-formoterol (SYMBICORT) 160-4.5 MCG/ACT inhaler, Inhale 1 puff into the lungs 2 (two) times daily., Disp: 1 Inhaler, Rfl: 3 .  Calcium-Magnesium-Zinc 500-250-12.5 MG TABS, Take by mouth 2 (two) times daily., Disp: , Rfl:  .  Cholecalciferol (VITAMIN D-3) 5000 UNITS TABS, Take 2 each by mouth daily., Disp: , Rfl:  .  citalopram (CELEXA) 40 MG tablet, Take 40 mg by mouth daily., Disp: , Rfl: 11 .  donepezil  (ARICEPT) 10 MG tablet, Take 10 mg by mouth daily., Disp: , Rfl:  .  Fluticasone-Salmeterol (ADVAIR DISKUS) 250-50 MCG/DOSE AEPB, Inhale 1 puff into the lungs 2 (two) times daily., Disp: 1 each, Rfl: 3 .  gabapentin (NEURONTIN) 300 MG capsule, TAKE 1 CAPSULE BY MOUTH TWICE DAILY, Disp: 60 capsule, Rfl: 0 .  meloxicam (MOBIC) 7.5 MG tablet, Take 1 tablet (7.5 mg total) by mouth daily., Disp: 30 tablet, Rfl: 5 .  memantine (NAMENDA) 10 MG tablet, Take 10 mg by mouth 2 (two) times daily., Disp: , Rfl:  .  omeprazole (PRILOSEC) 20 MG capsule, Take 20 mg by mouth daily. , Disp: , Rfl:  .  pravastatin (PRAVACHOL) 40 MG tablet, Take 1 tablet (40 mg total) by mouth daily., Disp: 30 tablet, Rfl: 5 .  vitamin B-12 (CYANOCOBALAMIN) 100 MCG tablet, Take 100 mcg by mouth  daily., Disp: , Rfl:  .  HYDROcodone-acetaminophen (NORCO) 10-325 MG tablet, Take 1-2 tablets by mouth every 6 (six) hours as needed., Disp: 40 tablet, Rfl: 0 .  lisinopril (PRINIVIL,ZESTRIL) 10 MG tablet, Take 1 tablet (10 mg total) by mouth daily., Disp: 90 tablet, Rfl: 0 .  predniSONE (STERAPRED UNI-PAK 48 TAB) 10 MG (48) TBPK tablet, Take as directed for 12 days, Disp: 48 tablet, Rfl: 0  Continue all other maintenance medications as listed above.  Follow up plan: Return for recheck.  Educational handout given for [place] PA-C [place] [address]  [place], [place] [address] [phone]   03/13/2017, 12:24 PM

## 2017-03-15 ENCOUNTER — Other Ambulatory Visit: Payer: Self-pay | Admitting: Physician Assistant

## 2017-03-15 ENCOUNTER — Encounter: Payer: Self-pay | Admitting: Orthopaedic Surgery

## 2017-03-15 ENCOUNTER — Ambulatory Visit (INDEPENDENT_AMBULATORY_CARE_PROVIDER_SITE_OTHER): Payer: 59 | Admitting: Orthopaedic Surgery

## 2017-03-15 VITALS — BP 178/110 | HR 69 | Temp 97.4°F | Ht 69.5 in | Wt 243.0 lb

## 2017-03-15 DIAGNOSIS — E785 Hyperlipidemia, unspecified: Secondary | ICD-10-CM

## 2017-03-15 DIAGNOSIS — M25512 Pain in left shoulder: Secondary | ICD-10-CM | POA: Diagnosis not present

## 2017-03-15 MED ORDER — NAPROXEN 500 MG PO TABS: 500.0000 mg | ORAL_TABLET | Freq: Two times a day (BID) | ORAL | 5 refills | Status: DC

## 2017-03-15 NOTE — Progress Notes (Signed)
Patient NA:TFTDD Morgan Santos, female DOB:1951/07/03, 66 y.o. UKG:[id number]  Chief Complaint  Patient presents with  . Shoulder Pain    left    HPI  Morgan Santos is a 66 y.o. female who has left shoulder pain after injury on 03-12-17.  She hurt her left shoulder in a fall. She went to [facility] and had x-rays and was seen in the ER.  I have the ER records and x-rays and report.  I have reviewed them.  She is unable to raise her left shoulder secondary to pain.  She has used sling, ice and Advil with no help.  She has no other injury. HPI  Body mass index is 35.37 kg/m.  ROS  Review of Systems  HENT: Negative for congestion.   Respiratory: Negative for cough and shortness of breath.   Cardiovascular: Negative for chest pain and leg swelling.  Endocrine: Positive for cold intolerance.  Musculoskeletal: Positive for arthralgias and joint swelling.  Allergic/Immunologic: Positive for environmental allergies.  Psychiatric/Behavioral: The patient is nervous/anxious.    Past Medical History:  Diagnosis Date  . Anemia    when she was younger  . Anxiety   . Arthritis    hip- R, back; osteoarthritis  . Breast cancer ([place])   . Complication of anesthesia   . Constipation   . Depression    pt. remarks that she "takes respridal because if I don't take it I get angry"   . GERD (gastroesophageal reflux disease)    pt. uses vinegar for heartburn, ususally once per week   . H/O degenerative disc disease   . Hyperlipidemia   . Hypertension    "when I get upset"  Not on medication  . PONV (postoperative nausea and vomiting)    "just for one day"  . Stroke [facility]) ?2010 or 2011   2010, [facility] for stroke "mini stroke" short term memory problems since  . Wears dentures    top  . Wears glasses     Past Surgical History:  Procedure Laterality Date  . BREAST RECONSTRUCTION WITH PLACEMENT OF TISSUE EXPANDER AND FLEX HD (ACELLULAR HYDRATED DERMIS) Right 07/12/2013   Procedure:  RIGHT BREAST RECONSTRUCTION WITH PLACEMENT OF TISSUE EXPANDER AND FLEX HD TO RIGHT BREAST (ACELLULAR HYDRATED DERMIS);  Surgeon: Irene Limbo, MD;  Location: [place];  Service: Plastics;  Laterality: Right;  . BREAST SURGERY     for abcesses- removed from both breasts, many yrs. ago  . CHOLECYSTECTOMY    . COLONOSCOPY   01/29/2004   NUR:A 7-8 mm polyp snared from the hepatic flexure.  Another 3 mm polyp was     cold snared from the sigmoid colon.External hemorrhoids, possible source of recent rectal bleeding  . COLONOSCOPY N/A 06/05/2014   Procedure: COLONOSCOPY;  Surgeon: Danie Binder, MD;  [facility];  Service: Endoscopy;  Laterality: N/A;  1030 - moved to 10:45 - [place]inger to notify pt   . DILATION AND CURETTAGE OF UTERUS    . LUMBAR SPINE SURGERY    . MASTECTOMY    . MASTECTOMY W/ SENTINEL NODE BIOPSY Right 07/12/2013   Procedure: MASTECTOMY WITH SENTINEL LYMPH NODE BIOPSY;  Surgeon: Rolm Bookbinder, MD;  Location: [place];  Service: General;  Laterality: Right;  . MASTOPEXY Left 01/14/2014   Procedure: LEFT BREAST MASTOPEXY FOR SYMMETRY ;  Surgeon: Irene Limbo, MD;  Location: [place];  Service: Plastics;  Laterality: Left;  . PORT-A-CATH REMOVAL Left 01/14/2014   Procedure: REMOVAL PORT-A-CATH;  Surgeon: Irene Limbo, MD;  Location: [place];  Service: Plastics;  Laterality: Left;  . PORTACATH PLACEMENT N/A 09/16/2013   Procedure: INSERTION PORT-A-CATH;  Surgeon: Rolm Bookbinder, MD;  Location: [place];  Service: General;  Laterality: N/A;  . REMOVAL OF BILATERAL TISSUE EXPANDERS WITH PLACEMENT OF BILATERAL BREAST IMPLANTS Right 01/14/2014   Procedure: REMOVAL OF RIGHT TISSUE EXPANDERS WITH PLACEMENT OF PERMANENT BREAST IMPLANT;  Surgeon: Irene Limbo, MD;  Location: [place];  Service: Plastics;  Laterality: Right;  . TUBAL LIGATION    . VAGINAL HYSTERECTOMY      Current Outpatient Prescriptions on File Prior  to Visit  Medication Sig Dispense Refill  . ALPRAZolam (XANAX) 0.5 MG tablet Take 1 tablet (0.5 mg total) by mouth 2 (two) times daily as needed for anxiety. 60 tablet 2  . anastrozole (ARIMIDEX) 1 MG tablet TAKE ONE TABLET BY MOUTH ONCE DAILY 90 tablet 3  . budesonide-formoterol (SYMBICORT) 160-4.5 MCG/ACT inhaler Inhale 1 puff into the lungs 2 (two) times daily. 1 Inhaler 3  . Calcium-Magnesium-Zinc 500-250-12.5 MG TABS Take by mouth 2 (two) times daily.    . Cholecalciferol (VITAMIN D-3) 5000 UNITS TABS Take 2 each by mouth daily.    . citalopram (CELEXA) 40 MG tablet Take 40 mg by mouth daily.  11  . donepezil (ARICEPT) 10 MG tablet Take 10 mg by mouth daily.    . Fluticasone-Salmeterol (ADVAIR DISKUS) 250-50 MCG/DOSE AEPB Inhale 1 puff into the lungs 2 (two) times daily. 1 each 3  . gabapentin (NEURONTIN) 300 MG capsule TAKE 1 CAPSULE BY MOUTH TWICE DAILY 60 capsule 0  . HYDROcodone-acetaminophen (NORCO) 10-325 MG tablet Take 1-2 tablets by mouth every 6 (six) hours as needed. 40 tablet 0  . lisinopril (PRINIVIL,ZESTRIL) 10 MG tablet Take 1 tablet (10 mg total) by mouth daily. 90 tablet 0  . meloxicam (MOBIC) 7.5 MG tablet Take 1 tablet (7.5 mg total) by mouth daily. 30 tablet 5  . memantine (NAMENDA) 10 MG tablet Take 10 mg by mouth 2 (two) times daily.    Mar[place] Kitchen omeprazole (PRILOSEC) 20 MG capsule Take 20 mg by mouth daily.     . pravastatin (PRAVACHOL) 40 MG tablet Take 1 tablet (40 mg total) by mouth daily. 30 tablet 5  . predniSONE (STERAPRED UNI-PAK 48 TAB) 10 MG (48) TBPK tablet Take as directed for 12 days 48 tablet 0  . vitamin B-12 (CYANOCOBALAMIN) 100 MCG tablet Take 100 mcg by mouth daily.     No current facility-administered medications on file prior to visit.     Social History   Social History  . Marital status: Married    Spouse name: N/A  . Number of children: 4  . Years of education: N/A   Occupational History  . retired 2014    Social History Main Topics  .  Smoking status: Former Smoker    Quit date: 09/14/1995  . Smokeless tobacco: Never Used  . Alcohol use No  . Drug use: Yes    Types: Marijuana     Comment: in the past  . Sexual activity: Yes   Other Topics Concern  . Not on file   Social History Narrative  . No narrative on file    Family History  Problem Relation Age of Onset  . Hypertension Mother   . Diabetes type II Mother   . CVA Mother   . Heart disease Mother   . Osteoarthritis Mother   . Depression Mother   .  Other Mother        degenerative disc disease  . Alcoholism Father   . Pneumonia Father   . Colon cancer Maternal Aunt        age 67s  . Colon cancer Other        maternal great aunt    BP (!) 178/110   Pulse 69   Temp (!) 97.4 F (36.3 C)   Ht 5' 9.5" (1.765 m)   Wt 243 lb (110.2 kg)   BMI 35.37 kg/m      Physical Exam  Blood pressure (!) 178/110, pulse 69, temperature (!) 97.4 F (36.3 C), height 5' 9.5" (1.765 m), weight 243 lb (110.2 kg).  Constitutional: overall normal hygiene, normal nutrition, well developed, normal grooming, normal body habitus. Assistive device:none  Musculoskeletal: gait and station Limp none, muscle tone and strength are normal, no tremors or atrophy is present.  .  Neurological: coordination overall normal.  Deep tendon reflex/nerve stretch intact.  Sensation normal.  Cranial nerves II-XII intact.   Skin:   Normal overall no scars, lesions, ulcers or rashes. No psoriasis.  Psychiatric: Alert and oriented x 3.  Recent memory intact, remote memory unclear.  Normal mood and affect. Well groomed.  Good eye contact.  Cardiovascular: overall no swelling, no varicosities, no edema bilaterally, normal temperatures of the legs and arms, no clubbing, cyanosis and good capillary refill.  Lymphatic: palpation is normal.  Examination of left Upper Extremity is done.  Inspection:   Overall:  Elbow non-tender without crepitus or defects, forearm non-tender without crepitus  or defects, wrist non-tender without crepitus or defects, hand non-tender.    Shoulder: with glenohumeral joint tenderness, without effusion.   Upper arm: without swelling and tenderness   Range of motion:   Overall:  Full range of motion of the elbow, full range of motion of wrist and full range of motion in fingers.   Shoulder:  left  35 degrees forward flexion; 30 degrees abduction; 20 degrees internal rotation, 20 degrees external rotation, 10 degrees extension, 20 degrees adduction.   Stability:   Overall:  Shoulder, elbow and wrist stable   Strength and Tone:   Overall full shoulder muscles strength, full upper arm strength and normal upper arm bulk and tone.   The patient has been educated about the nature of the problem(s) and counseled on treatment options.  The patient appeared to understand what I have discussed and is in agreement with it.  Encounter Diagnosis  Name Primary?  . Acute pain of left shoulder Yes    PLAN Call if any problems.  Precautions discussed.  Continue current medications.   Return to clinic 2 weeks   Begin Naprosyn. Precautions given.  Begin PT, [place].  Electronically Signed Sanjuana Kava, MD 7/25/20189:32 AM

## 2017-03-20 ENCOUNTER — Other Ambulatory Visit: Payer: Self-pay | Admitting: Physician Assistant

## 2017-03-21 ENCOUNTER — Other Ambulatory Visit: Payer: Self-pay | Admitting: *Deleted

## 2017-03-21 ENCOUNTER — Ambulatory Visit: Payer: 59 | Attending: Orthopaedic Surgery | Admitting: Physical Therapy

## 2017-03-21 DIAGNOSIS — M25512 Pain in left shoulder: Secondary | ICD-10-CM | POA: Diagnosis not present

## 2017-03-21 DIAGNOSIS — M6281 Muscle weakness (generalized): Secondary | ICD-10-CM | POA: Insufficient documentation

## 2017-03-21 MED ORDER — CITALOPRAM HYDROBROMIDE 40 MG PO TABS: 40.0000 mg | ORAL_TABLET | Freq: Every day | ORAL | 0 refills | Status: DC

## 2017-03-21 NOTE — Therapy (Signed)
[name], [place], [address] Phone: [phone]   Fax:  [phone]  Physical Therapy Evaluation  Patient Details  Name: Morgan Santos MRN: [id number] Date of Birth: 1951-01-15 Referring Provider: Sanjuana Kava MD  Encounter Date: 03/21/2017      PT End of Session - 03/21/17 1243    Visit Number 1   Number of Visits 16   Date for PT Re-Evaluation 05/20/17   PT Start Time 0945   PT Stop Time 1034   PT Time Calculation (min) 49 min   Activity Tolerance Patient tolerated treatment well   Behavior During Therapy [facility] for tasks assessed/performed      Past Medical History:  Diagnosis Date  . Anemia    when she was younger  . Anxiety   . Arthritis    hip- R, back; osteoarthritis  . Breast cancer ([place])   . Complication of anesthesia   . Constipation   . Depression    pt. remarks that she "takes respridal because if I don't take it I get angry"   . GERD (gastroesophageal reflux disease)    pt. uses vinegar for heartburn, ususally once per week   . H/O degenerative disc disease   . Hyperlipidemia   . Hypertension    "when I get upset"  Not on medication  . PONV (postoperative nausea and vomiting)    "just for one day"  . Stroke [facility] ?2010 or 2011   2010, [facility] for stroke "mini stroke" short term memory problems since  . Wears dentures    top  . Wears glasses     Past Surgical History:  Procedure Laterality Date  . BREAST RECONSTRUCTION WITH PLACEMENT OF TISSUE EXPANDER AND FLEX HD (ACELLULAR HYDRATED DERMIS) Right 07/12/2013   Procedure: RIGHT BREAST RECONSTRUCTION WITH PLACEMENT OF TISSUE EXPANDER AND FLEX HD TO RIGHT BREAST (ACELLULAR HYDRATED DERMIS);  Surgeon: Irene Limbo, MD;  Location: [place];  Service: Plastics;  Laterality: Right;  . BREAST SURGERY     for abcesses- removed from both breasts, many yrs. ago  . CHOLECYSTECTOMY    . COLONOSCOPY   01/29/2004   NUR:A 7-8 mm polyp  snared from the hepatic flexure.  Another 3 mm polyp was     cold snared from the sigmoid colon.External hemorrhoids, possible source of recent rectal bleeding  . COLONOSCOPY N/A 06/05/2014   Procedure: COLONOSCOPY;  Surgeon: Danie Binder, MD;  [facility];  Service: Endoscopy;  Laterality: N/A;  1030 - moved to 10:45 - Ginger to notify pt   . DILATION AND CURETTAGE OF UTERUS    . LUMBAR SPINE SURGERY    . MASTECTOMY    . MASTECTOMY W/ SENTINEL NODE BIOPSY Right 07/12/2013   Procedure: MASTECTOMY WITH SENTINEL LYMPH NODE BIOPSY;  Surgeon: Rolm Bookbinder, MD;  Location: [place];  Service: General;  Laterality: Right;  . MASTOPEXY Left 01/14/2014   Procedure: LEFT BREAST MASTOPEXY FOR SYMMETRY ;  Surgeon: Irene Limbo, MD;  Location: [place];  Service: Plastics;  Laterality: Left;  . PORT-A-CATH REMOVAL Left 01/14/2014   Procedure: REMOVAL PORT-A-CATH;  Surgeon: Irene Limbo, MD;  Location: [place];  Service: Plastics;  Laterality: Left;  . PORTACATH PLACEMENT N/A 09/16/2013   Procedure: INSERTION PORT-A-CATH;  Surgeon: Rolm Bookbinder, MD;  Location: [place];  Service: General;  Laterality: N/A;  . REMOVAL OF BILATERAL TISSUE EXPANDERS WITH PLACEMENT OF BILATERAL BREAST IMPLANTS Right 01/14/2014   Procedure: REMOVAL  OF RIGHT TISSUE EXPANDERS WITH PLACEMENT OF PERMANENT BREAST IMPLANT;  Surgeon: Irene Limbo, MD;  Location: [place];  Service: Plastics;  Laterality: Right;  . TUBAL LIGATION    . VAGINAL HYSTERECTOMY      There were no vitals filed for this visit.       Subjective Assessment - 03/21/17 1244    Subjective The patient presents 2 recent injuries to her left shoulder one involving a fall another in which she grabbed for a railing on 03/12/17 and pulled her left shoulder.  She is wearing a sling today. Her pain is an 8/10 with movement and decreases with rest.   Patient Stated Goals Use left arm again  without pain.   Currently in Pain? Yes   Pain Score 8    Pain Location Shoulder   Pain Orientation Left   Pain Descriptors / Indicators Aching   Pain Onset 1 to 4 weeks ago   Pain Frequency Constant   Aggravating Factors  See above.   Pain Relieving Factors See above.            [facility] PT Assessment - 03/21/17 0001      Assessment   Medical Diagnosis Acute left shoulder pain.   Referring Provider Sanjuana Kava MD   Onset Date/Surgical Date --  03/12/17.     Precautions   Precautions None   Required Braces or Orthoses --  Left shoulder in sling.     Restrictions   Weight Bearing Restrictions No     Balance Screen   Has the patient fallen in the past 6 months Yes   How many times? --  3 to 4.   Has the patient had a decrease in activity level because of a fear of falling?  No   Is the patient reluctant to leave their home because of a fear of falling?  No     Prior Function   Level of Independence Independent     Posture/Postural Control   Posture Comments Guarded left UE posture.     ROM / Strength   AROM / PROM / Strength AROM;Strength     AROM   Overall AROM Comments Full active-assistive left shoulder range ofmotion in supine.     Strength   Overall Strength Comments Left shoulder ER= 4 to 4+/5.     Palpation   Palpation comment Tender to palpation left shoulder posterior cuff region.     Ambulation/Gait   Gait Comments WNL.            Objective measurements completed on examination: See above findings.          [facility] Adult PT Treatment/Exercise - 03/21/17 0001      Modalities   Modalities Electrical Stimulation     Electrical Stimulation   Electrical Stimulation Location Left shoulder.   Electrical Stimulation Action IFC   Electrical Stimulation Parameters 80-150 Hz at 100% scan x 20 minutes.   Electrical Stimulation Goals Pain                  PT Short Term Goals - 03/21/17 1301      PT SHORT TERM GOAL #1   Title  STG's=LTG's.           PT Long Term Goals - 03/21/17 1301      PT LONG TERM GOAL #1   Title Pt will be independent in advanced HEP.    Time 8   Period Weeks   Status New  PT LONG TERM GOAL #2   Title Active left shoulder flexion to 150 degrees so the patient can easily reach overhead.   Time 8   Period Weeks   Status New     PT LONG TERM GOAL #3   Title Increase shoulder strength to a solid 5/5 to increase stability for performance of functional activities.   Time 8   Period Weeks   Status New     PT LONG TERM GOAL #4   Title Perform ADL's with left shoulder pain not > 2-3/10.   Time 8   Period Weeks   Status New                Plan - 03/21/17 1256    Clinical Impression Statement The patient prsents with left shoulder pain rated at an 8/10.  She has full active-assistive range of motion but states pain prohibits her her actively moving her left UE against gravity.  Patient will benefit from skilled physical therapy to decrease pain and improve function.   Clinical Presentation Evolving   Clinical Presentation due to: Not improving.   Clinical Decision Making Low   Rehab Potential Excellent   PT Frequency 2x / week   PT Duration 8 weeks   PT Treatment/Interventions ADLs/Self Care Home Management;Cryotherapy;Electrical Stimulation;Moist Heat;Ultrasound;Therapeutic activities;Therapeutic exercise;Patient/family education;Manual techniques;Vasopneumatic Device;Dry needling   PT Next Visit Plan Modalites to decrease left shoulder pain.  STW/M to left shoulder posterior cuff region.  Begin active-assisitive exercise with progress to PRE's (ie:  yellow band RW4).   Consulted and Agree with Plan of Care Patient      Patient will benefit from skilled therapeutic intervention in order to improve the following deficits and impairments:  Decreased activity tolerance, Pain, Decreased strength  Visit Diagnosis: Acute pain of left shoulder - Plan: PT plan of care  cert/re-cert  Muscle weakness (generalized) - Plan: PT plan of care cert/re-cert      G-Codes - 82/95/62 1038    Functional Assessment Tool Used (Outpatient Only) Clinical judgement.   Functional Limitation Self care   Self Care Current Status [phone]) At least 40 percent but less than 60 percent impaired, limited or restricted   Self Care Goal Status ([id number]) At least 1 percent but less than 20 percent impaired, limited or restricted       Problem List Patient Active Problem List   Diagnosis Date Noted  . Disorder of left rotator cuff 03/13/2017  . Essential hypertension 03/13/2017  . Reactive airway disease that is not asthma 09/21/2016  . History of breast cancer 09/21/2016  . Depression 05/13/2016  . Memory loss 05/13/2016  . Gastroesophageal reflux disease without esophagitis 05/13/2016  . Hyperlipidemia 05/13/2016  . Body mass index 34.0-34.9, adult 05/13/2016  . Unspecified constipation 05/21/2014  . FH: colon cancer 05/21/2014  . Breast cancer of upper-outer quadrant of right female breast ([employer]) 06/19/2013    Tavarious Freel, [place] MPT 03/21/2017, 1:05 PM  [facility] [place], [place], [address] Phone: [phone]   Fax:  [phone]  Name: KELLE RUPPERT MRN: [id number] Date of Birth: [birth year]

## 2017-03-22 NOTE — Telephone Encounter (Signed)
Last seen 03/13/17  Morgan Santos  If approved route to nurse to call into [facility]

## 2017-03-22 NOTE — Telephone Encounter (Signed)
Rx called in to pharmacy. 

## 2017-03-23 ENCOUNTER — Ambulatory Visit: Payer: 59 | Attending: Orthopaedic Surgery | Admitting: *Deleted

## 2017-03-23 DIAGNOSIS — M6281 Muscle weakness (generalized): Secondary | ICD-10-CM | POA: Insufficient documentation

## 2017-03-23 DIAGNOSIS — M25512 Pain in left shoulder: Secondary | ICD-10-CM | POA: Diagnosis not present

## 2017-03-23 NOTE — Therapy (Signed)
[facility] [place], [place], [address] Phone: [phone]   Fax:  [phone]  Physical Therapy Treatment  Patient Details  Name: Morgan Santos MRN: [id number] Date of Birth: 06/14/1951 Referring Provider: Sanjuana Kava MD  Encounter Date: 03/23/2017      PT End of Session - 03/23/17 0830    Visit Number 2   Number of Visits 16   Date for PT Re-Evaluation 05/20/17   PT Start Time 0824   PT Stop Time 0915   PT Time Calculation (min) 51 min      Past Medical History:  Diagnosis Date  . Anemia    when she was younger  . Anxiety   . Arthritis    hip- R, back; osteoarthritis  . Breast cancer ([place])   . Complication of anesthesia   . Constipation   . Depression    pt. remarks that she "takes respridal because if I don't take it I get angry"   . GERD (gastroesophageal reflux disease)    pt. uses vinegar for heartburn, ususally once per week   . H/O degenerative disc disease   . Hyperlipidemia   . Hypertension    "when I get upset"  Not on medication  . PONV (postoperative nausea and vomiting)    "just for one day"  . Stroke [facility]) ?2010 or 2011   2010, [facility] for stroke "mini stroke" short term memory problems since  . Wears dentures    top  . Wears glasses     Past Surgical History:  Procedure Laterality Date  . BREAST RECONSTRUCTION WITH PLACEMENT OF TISSUE EXPANDER AND FLEX HD (ACELLULAR HYDRATED DERMIS) Right 07/12/2013   Procedure: RIGHT BREAST RECONSTRUCTION WITH PLACEMENT OF TISSUE EXPANDER AND FLEX HD TO RIGHT BREAST (ACELLULAR HYDRATED DERMIS);  Surgeon: Irene Limbo, MD;  Location: [place];  Service: Plastics;  Laterality: Right;  . BREAST SURGERY     for abcesses- removed from both breasts, many yrs. ago  . CHOLECYSTECTOMY    . COLONOSCOPY   01/29/2004   NUR:A 7-8 mm polyp snared from the hepatic flexure.  Another 3 mm polyp was     cold snared from the sigmoid colon.External hemorrhoids,  possible source of recent rectal bleeding  . COLONOSCOPY N/A 06/05/2014   Procedure: COLONOSCOPY;  Surgeon: Danie Binder, MD;  [facility];  Service: Endoscopy;  Laterality: N/A;  1030 - moved to 10:45 - Ginger to notify pt   . DILATION AND CURETTAGE OF UTERUS    . LUMBAR SPINE SURGERY    . MASTECTOMY    . MASTECTOMY W/ SENTINEL NODE BIOPSY Right 07/12/2013   Procedure: MASTECTOMY WITH SENTINEL LYMPH NODE BIOPSY;  Surgeon: Rolm Bookbinder, MD;  Location: [place];  Service: General;  Laterality: Right;  . MASTOPEXY Left 01/14/2014   Procedure: LEFT BREAST MASTOPEXY FOR SYMMETRY ;  Surgeon: Irene Limbo, MD;  Location: [place];  Service: Plastics;  Laterality: Left;  . PORT-A-CATH REMOVAL Left 01/14/2014   Procedure: REMOVAL PORT-A-CATH;  Surgeon: Irene Limbo, MD;  Location: [place];  Service: Plastics;  Laterality: Left;  . PORTACATH PLACEMENT N/A 09/16/2013   Procedure: INSERTION PORT-A-CATH;  Surgeon: Rolm Bookbinder, MD;  Location: [place];  Service: General;  Laterality: N/A;  . REMOVAL OF BILATERAL TISSUE EXPANDERS WITH PLACEMENT OF BILATERAL BREAST IMPLANTS Right 01/14/2014   Procedure: REMOVAL OF RIGHT TISSUE EXPANDERS WITH PLACEMENT OF PERMANENT BREAST IMPLANT;  Surgeon: Irene Limbo, MD;  Location:  [place];  Service: Plastics;  Laterality: Right;  . TUBAL LIGATION    . VAGINAL HYSTERECTOMY      There were no vitals filed for this visit.      Subjective Assessment - 03/23/17 0826    Subjective The patient presents 2 recent injuries to her left shoulder one involving a fall another in which she grabbed for a railing on 03/12/17 and pulled her left shoulder.  She is wearing a sling today. Her pain is an 8/10 with movement and decreases with rest.   Patient Stated Goals Use left arm again without pain.   Currently in Pain? Yes   Pain Score 3    Pain Location Shoulder   Pain Orientation Left   Pain  Descriptors / Indicators Aching   Pain Onset 1 to 4 weeks ago   Pain Frequency Constant                         [facility] Adult PT Treatment/Exercise - 03/23/17 0001      Exercises   Exercises Shoulder;Elbow     Shoulder Exercises: Pulleys   Flexion --   5 mins   Other Pulley Exercises sitting UE ranger x 5 mins flexion and circles each     Modalities   Modalities Electrical Stimulation     Electrical Stimulation   Electrical Stimulation Location Left shoulder.  IFC x 15 mins 80-150hz    Electrical Stimulation Goals Pain     Manual Therapy   Manual Therapy Passive ROM   Passive ROM PAROM to LT shldr for elevation and  ER/IR with Pt supine      Instructed in table top exs and pendulum exs for HEP            PT Education - 03/23/17 0942    Education provided Yes   Education Details  pendulums and  table top slides   Person(s) Educated Patient   Methods Explanation;Demonstration;Handout   Comprehension Verbalized understanding;Returned demonstration          PT Short Term Goals - 03/21/17 1301      PT SHORT TERM GOAL #1   Title STG's=LTG's.           PT Long Term Goals - 03/21/17 1301      PT LONG TERM GOAL #1   Title Pt will be independent in advanced HEP.    Time 8   Period Weeks   Status New     PT LONG TERM GOAL #2   Title Active left shoulder flexion to 150 degrees so the patient can easily reach overhead.   Time 8   Period Weeks   Status New     PT LONG TERM GOAL #3   Title Increase shoulder strength to a solid 5/5 to increase stability for performance of functional activities.   Time 8   Period Weeks   Status New     PT LONG TERM GOAL #4   Title Perform ADL's with left shoulder pain not > 2-3/10.   Time 8   Period Weeks   Status New               Plan - 03/23/17 0950    Clinical Impression Statement Pt arrived to clinic today doing fairly well with LT shldr pain being less. She was instructed in table top exs  and pendulums for HEP and was able to perform AAROM act.'s in clinic with minimal pain today.  Patient will benefit from skilled therapeutic intervention in order to improve the following deficits and impairments:     Visit Diagnosis: Acute pain of left shoulder  Muscle weakness (generalized)     Problem List Patient Active Problem List   Diagnosis Date Noted  . Disorder of left rotator cuff 03/13/2017  . Essential hypertension 03/13/2017  . Reactive airway disease that is not asthma 09/21/2016  . History of breast cancer 09/21/2016  . Depression 05/13/2016  . Memory loss 05/13/2016  . Gastroesophageal reflux disease without esophagitis 05/13/2016  . Hyperlipidemia 05/13/2016  . Body mass index 34.0-34.9, adult 05/13/2016  . Unspecified constipation 05/21/2014  . FH: colon cancer 05/21/2014  . Breast cancer of upper-outer quadrant of right female breast ([place]) 06/19/2013    Roxie Kreeger,CHRIS, PTA 03/23/2017, 9:57 AM  [facility] [place], [place], [address] Phone: [phone]   Fax:  [phone]  Name: LATRICE STORLIE MRN: [id number] Date of Birth: 1951/05/28

## 2017-03-28 ENCOUNTER — Ambulatory Visit: Payer: 59

## 2017-03-28 DIAGNOSIS — M25512 Pain in left shoulder: Secondary | ICD-10-CM

## 2017-03-28 DIAGNOSIS — M6281 Muscle weakness (generalized): Secondary | ICD-10-CM

## 2017-03-28 NOTE — Therapy (Signed)
[facility] [place], [place], [address] Phone: [phone]   Fax:  [phone]  Physical Therapy Treatment  Patient Details  Name: Morgan Santos MRN: [id number] Date of Birth: Dec 11, 1950 Referring Provider: Sanjuana Kava MD  Encounter Date: 03/28/2017      PT End of Session - 03/28/17 0916    Visit Number 3   Number of Visits 16   Date for PT Re-Evaluation 05/20/17   PT Start Time 4431  pt. arrived late    PT Stop Time 1000   PT Time Calculation (min) 49 min      Past Medical History:  Diagnosis Date  . Anemia    when she was younger  . Anxiety   . Arthritis    hip- R, back; osteoarthritis  . Breast cancer ([place])   . Complication of anesthesia   . Constipation   . Depression    pt. remarks that she "takes respridal because if I don't take it I get angry"   . GERD (gastroesophageal reflux disease)    pt. uses vinegar for heartburn, ususally once per week   . H/O degenerative disc disease   . Hyperlipidemia   . Hypertension    "when I get upset"  Not on medication  . PONV (postoperative nausea and vomiting)    "just for one day"  . Stroke [facility]) ?2010 or 2011   2010, [facility] for stroke "mini stroke" short term memory problems since  . Wears dentures    top  . Wears glasses     Past Surgical History:  Procedure Laterality Date  . BREAST RECONSTRUCTION WITH PLACEMENT OF TISSUE EXPANDER AND FLEX HD (ACELLULAR HYDRATED DERMIS) Right 07/12/2013   Procedure: RIGHT BREAST RECONSTRUCTION WITH PLACEMENT OF TISSUE EXPANDER AND FLEX HD TO RIGHT BREAST (ACELLULAR HYDRATED DERMIS);  Surgeon: Irene Limbo, MD;  Location: [place];  Service: Plastics;  Laterality: Right;  . BREAST SURGERY     for abcesses- removed from both breasts, many yrs. ago  . CHOLECYSTECTOMY    . COLONOSCOPY   01/29/2004   NUR:A 7-8 mm polyp snared from the hepatic flexure.  Another 3 mm polyp was     cold snared from the sigmoid  colon.External hemorrhoids, possible source of recent rectal bleeding  . COLONOSCOPY N/A 06/05/2014   Procedure: COLONOSCOPY;  Surgeon: Danie Binder, MD;  [facility];  Service: Endoscopy;  Laterality: N/A;  1030 - moved to 10:45 - Ginger to notify pt   . DILATION AND CURETTAGE OF UTERUS    . LUMBAR SPINE SURGERY    . MASTECTOMY    . MASTECTOMY W/ SENTINEL NODE BIOPSY Right 07/12/2013   Procedure: MASTECTOMY WITH SENTINEL LYMPH NODE BIOPSY;  Surgeon: Rolm Bookbinder, MD;  Location: [place];  Service: General;  Laterality: Right;  . MASTOPEXY Left 01/14/2014   Procedure: LEFT BREAST MASTOPEXY FOR SYMMETRY ;  Surgeon: Irene Limbo, MD;  Location: [place];  Service: Plastics;  Laterality: Left;  . PORT-A-CATH REMOVAL Left 01/14/2014   Procedure: REMOVAL PORT-A-CATH;  Surgeon: Irene Limbo, MD;  Location: [place];  Service: Plastics;  Laterality: Left;  . PORTACATH PLACEMENT N/A 09/16/2013   Procedure: INSERTION PORT-A-CATH;  Surgeon: Rolm Bookbinder, MD;  Location: [place];  Service: General;  Laterality: N/A;  . REMOVAL OF BILATERAL TISSUE EXPANDERS WITH PLACEMENT OF BILATERAL BREAST IMPLANTS Right 01/14/2014   Procedure: REMOVAL OF RIGHT TISSUE EXPANDERS WITH PLACEMENT OF PERMANENT BREAST IMPLANT;  Surgeon:  Irene Limbo, MD;  Location: [place];  Service: Plastics;  Laterality: Right;  . TUBAL LIGATION    . VAGINAL HYSTERECTOMY      There were no vitals filed for this visit.      Subjective Assessment - 03/28/17 0912    Subjective Pt. noting she "feels a tiny discomfort".  Feels continued weakness with movements aboe shoulder height.     Patient Stated Goals Use left arm again without pain.   Currently in Pain? Yes   Pain Score 1    Pain Location Shoulder   Pain Orientation Left   Pain Descriptors / Indicators Discomfort   Pain Type Acute pain   Aggravating Factors  movement above shoulder height    Pain  Relieving Factors rest   Effect of Pain on Daily Activities limits reaching   Multiple Pain Sites No                         [facility] Adult PT Treatment/Exercise - 03/28/17 0928      Shoulder Exercises: Supine   External Rotation 15 reps;AAROM;Left   Theraband Level (Shoulder External Rotation) --  wand    Flexion 15 reps;AAROM;Left   Flexion Limitations wand      Shoulder Exercises: Seated   Abduction AAROM;15 reps   ABduction Limitations wand      Shoulder Exercises: Standing   Flexion AAROM;10 reps;Left   Flexion Limitations wall finger walk with gentle stretch at top    ABduction AAROM;10 reps;Left   ABduction Limitations scaption; wall finger walk with gentle stretch   Other Standing Exercises L shoulder flexion AAROM wall ball roll into gentle stretch x 10 reps     Electrical Stimulation   Electrical Stimulation Location Left shoulder    Electrical Stimulation Action IFC   Electrical Stimulation Parameters 80-150Hz , intensity to pt. tolerance, 15'    Electrical Stimulation Goals Pain;Tone     Manual Therapy   Manual Therapy Passive ROM   Passive ROM PROM to LT shldr for elevation and  ER/IR with Pt supine                  PT Short Term Goals - 03/21/17 1301      PT SHORT TERM GOAL #1   Title STG's=LTG's.           PT Long Term Goals - 03/28/17 0941      PT LONG TERM GOAL #1   Title Pt will be independent in advanced HEP.    Time 8   Period Weeks   Status On-going     PT LONG TERM GOAL #2   Title Active left shoulder flexion to 150 degrees so the patient can easily reach overhead.   Time 8   Period Weeks   Status On-going     PT LONG TERM GOAL #3   Title Increase shoulder strength to a solid 5/5 to increase stability for performance of functional activities.   Time 8   Period Weeks   Status On-going     PT LONG TERM GOAL #4   Title Perform ADL's with left shoulder pain not > 2-3/10.   Time 8   Period Weeks   Status  On-going        Clinical impression:  Pt. arrived late thus treatment time limited.  Advanced AAROM activities today with good tolerance.  PROM to pt. tolerance today with only mild discomfort at end range abduction.  Cueing required  to avoid painful arc with therex.  Pt. seems to be progressing well.    Plan:  Monitor tolerance to advancement of AAROM activities last treatment.  STM to L shoulder posterior cuff region         Patient will benefit from skilled therapeutic intervention in order to improve the following deficits and impairments:  Decreased activity tolerance, Pain, Decreased strength  Visit Diagnosis: Acute pain of left shoulder  Muscle weakness (generalized)     Problem List Patient Active Problem List   Diagnosis Date Noted  . Disorder of left rotator cuff 03/13/2017  . Essential hypertension 03/13/2017  . Reactive airway disease that is not asthma 09/21/2016  . History of breast cancer 09/21/2016  . Depression 05/13/2016  . Memory loss 05/13/2016  . Gastroesophageal reflux disease without esophagitis 05/13/2016  . Hyperlipidemia 05/13/2016  . Body mass index 34.0-34.9, adult 05/13/2016  . Unspecified constipation 05/21/2014  . FH: colon cancer 05/21/2014  . Breast cancer of upper-outer quadrant of right female breast [facility]) 06/19/2013    Bess Harvest, PTA 03/28/17 9:33 PM  [facility] [place], [place], [address] Phone: [phone]   Fax:  [phone]  Name: LILLYEN SCHOW MRN: [id number] Date of Birth: 09-Jun-1951

## 2017-03-30 ENCOUNTER — Ambulatory Visit: Payer: 59

## 2017-03-30 ENCOUNTER — Ambulatory Visit (INDEPENDENT_AMBULATORY_CARE_PROVIDER_SITE_OTHER): Payer: 59 | Admitting: Orthopaedic Surgery

## 2017-03-30 ENCOUNTER — Encounter: Payer: Self-pay | Admitting: Orthopaedic Surgery

## 2017-03-30 VITALS — BP 134/84 | HR 57 | Temp 98.3°F | Ht 69.25 in | Wt 239.0 lb

## 2017-03-30 DIAGNOSIS — M25512 Pain in left shoulder: Secondary | ICD-10-CM | POA: Diagnosis not present

## 2017-03-30 DIAGNOSIS — M6281 Muscle weakness (generalized): Secondary | ICD-10-CM | POA: Diagnosis not present

## 2017-03-30 NOTE — Progress Notes (Signed)
Patient Morgan Santos, female DOB:Jul 12, 1951, 66 y.o. FXT:[id number]  Chief Complaint  Patient presents with  . Follow-up    Left Shoulder    HPI  Morgan Santos is a 66 y.o. female who has left shoulder pain.  She has been going to PT and is improved.  She still has limited motion but her pain is much less.  She is glad she is going to PT. She will continue with the exercises. She has no new trauma. HPI  Body mass index is 35.04 kg/m.  ROS  Review of Systems  HENT: Negative for congestion.   Respiratory: Negative for cough and shortness of breath.   Cardiovascular: Negative for chest pain and leg swelling.  Endocrine: Positive for cold intolerance.  Musculoskeletal: Positive for arthralgias and joint swelling.  Allergic/Immunologic: Positive for environmental allergies.  Psychiatric/Behavioral: The patient is nervous/anxious.     Past Medical History:  Diagnosis Date  . Anemia    when she was younger  . Anxiety   . Arthritis    hip- R, back; osteoarthritis  . Breast cancer ([place])   . Complication of anesthesia   . Constipation   . Depression    pt. remarks that she "takes respridal because if I don't take it I get angry"   . GERD (gastroesophageal reflux disease)    pt. uses vinegar for heartburn, ususally once per week   . H/O degenerative disc disease   . Hyperlipidemia   . Hypertension    "when I get upset"  Not on medication  . PONV (postoperative nausea and vomiting)    "just for one day"  . Stroke [facility]) ?2010 or 2011   2010, [facility] for stroke "mini stroke" short term memory problems since  . Wears dentures    top  . Wears glasses     Past Surgical History:  Procedure Laterality Date  . BREAST RECONSTRUCTION WITH PLACEMENT OF TISSUE EXPANDER AND FLEX HD (ACELLULAR HYDRATED DERMIS) Right 07/12/2013   Procedure: RIGHT BREAST RECONSTRUCTION WITH PLACEMENT OF TISSUE EXPANDER AND FLEX HD TO RIGHT BREAST (ACELLULAR HYDRATED DERMIS);  Surgeon: Irene Limbo, MD;  Location: [place];  Service: Plastics;  Laterality: Right;  . BREAST SURGERY     for abcesses- removed from both breasts, many yrs. ago  . CHOLECYSTECTOMY    . COLONOSCOPY   01/29/2004   NUR:A 7-8 mm polyp snared from the hepatic flexure.  Another 3 mm polyp was     cold snared from the sigmoid colon.External hemorrhoids, possible source of recent rectal bleeding  . COLONOSCOPY N/A 06/05/2014   Procedure: COLONOSCOPY;  Surgeon: Danie Binder, MD;  [facility];  Service: Endoscopy;  Laterality: N/A;  1030 - moved to 10:45 - Ginger to notify pt   . DILATION AND CURETTAGE OF UTERUS    . LUMBAR SPINE SURGERY    . MASTECTOMY    . MASTECTOMY W/ SENTINEL NODE BIOPSY Right 07/12/2013   Procedure: MASTECTOMY WITH SENTINEL LYMPH NODE BIOPSY;  Surgeon: Rolm Bookbinder, MD;  Location: [place];  Service: General;  Laterality: Right;  . MASTOPEXY Left 01/14/2014   Procedure: LEFT BREAST MASTOPEXY FOR SYMMETRY ;  Surgeon: Irene Limbo, MD;  Location: [place];  Service: Plastics;  Laterality: Left;  . PORT-A-CATH REMOVAL Left 01/14/2014   Procedure: REMOVAL PORT-A-CATH;  Surgeon: Irene Limbo, MD;  Location: [place];  Service: Plastics;  Laterality: Left;  . PORTACATH PLACEMENT N/A 09/16/2013   Procedure: INSERTION PORT-A-CATH;  Surgeon: Rolm Bookbinder, MD;  Location: [place];  Service: General;  Laterality: N/A;  . REMOVAL OF BILATERAL TISSUE EXPANDERS WITH PLACEMENT OF BILATERAL BREAST IMPLANTS Right 01/14/2014   Procedure: REMOVAL OF RIGHT TISSUE EXPANDERS WITH PLACEMENT OF PERMANENT BREAST IMPLANT;  Surgeon: Irene Limbo, MD;  Location: [place];  Service: Plastics;  Laterality: Right;  . TUBAL LIGATION    . VAGINAL HYSTERECTOMY      Family History  Problem Relation Age of Onset  . Hypertension Mother   . Diabetes type II Mother   . CVA Mother   . Heart disease Mother   . Osteoarthritis Mother   . Depression  Mother   . Other Mother        degenerative disc disease  . Alcoholism Father   . Pneumonia Father   . Colon cancer Maternal Aunt        age 82s  . Colon cancer Other        maternal great aunt    Social History Social History  Substance Use Topics  . Smoking status: Former Smoker    Quit date: 09/14/1995  . Smokeless tobacco: Never Used  . Alcohol use No    Allergies  Allergen Reactions  . Codeine Nausea And Vomiting  . Crestor [Rosuvastatin Calcium]   . Lipitor [Atorvastatin]   . Multihance [Gadobenate] Nausea Only and Cough    PT HAD INCREASED MUCOUS PRODUCTION AND PHLEGMY COUGH LASTING 10 MINS AFTER INJECTION, PT SENT HOME WITH BENADRYL, PREV NAUSEA AFTER GAD    Current Outpatient Prescriptions  Medication Sig Dispense Refill  . ALPRAZolam (XANAX) 0.5 MG tablet TAKE 1 TABLET BY MOUTH TWICE DAILY AS NEEDED 60 tablet 2  . anastrozole (ARIMIDEX) 1 MG tablet TAKE ONE TABLET BY MOUTH ONCE DAILY 90 tablet 3  . budesonide-formoterol (SYMBICORT) 160-4.5 MCG/ACT inhaler Inhale 1 puff into the lungs 2 (two) times daily. 1 Inhaler 3  . Calcium-Magnesium-Zinc 500-250-12.5 MG TABS Take by mouth 2 (two) times daily.    . Cholecalciferol (VITAMIN D-3) 5000 UNITS TABS Take 2 each by mouth daily.    . citalopram (CELEXA) 40 MG tablet Take 1 tablet (40 mg total) by mouth daily. 90 tablet 0  . donepezil (ARICEPT) 10 MG tablet Take 10 mg by mouth daily.    . Fluticasone-Salmeterol (ADVAIR DISKUS) 250-50 MCG/DOSE AEPB Inhale 1 puff into the lungs 2 (two) times daily. 1 each 3  . gabapentin (NEURONTIN) 300 MG capsule TAKE 1 CAPSULE BY MOUTH TWICE DAILY 60 capsule 0  . HYDROcodone-acetaminophen (NORCO) 10-325 MG tablet Take 1-2 tablets by mouth every 6 (six) hours as needed. 40 tablet 0  . lisinopril (PRINIVIL,ZESTRIL) 10 MG tablet Take 1 tablet (10 mg total) by mouth daily. 90 tablet 0  . meloxicam (MOBIC) 7.5 MG tablet Take 1 tablet (7.5 mg total) by mouth daily. (Patient not taking: Reported  on 03/21/2017) 30 tablet 5  . memantine (NAMENDA) 10 MG tablet Take 10 mg by mouth 2 (two) times daily.    . naproxen (NAPROSYN) 500 MG tablet Take 1 tablet (500 mg total) by mouth 2 (two) times daily with a meal. 60 tablet 5  . omeprazole (PRILOSEC) 20 MG capsule Take 20 mg by mouth daily.     . pravastatin (PRAVACHOL) 40 MG tablet TAKE ONE TABLET BY MOUTH ONCE DAILY 30 tablet 0  . predniSONE (STERAPRED UNI-PAK 48 TAB) 10 MG (48) TBPK tablet Take as directed for 12 days 48 tablet 0  . vitamin B-12 (CYANOCOBALAMIN)  100 MCG tablet Take 100 mcg by mouth daily.     No current facility-administered medications for this visit.      Physical Exam  Blood pressure 134/84, pulse (!) 57, temperature 98.3 F (36.8 C), height 5' 9.25" (1.759 m), weight 239 lb (108.4 kg).  Constitutional: overall normal hygiene, normal nutrition, well developed, normal grooming, normal body habitus. Assistive device:none  Musculoskeletal: gait and station Limp none, muscle tone and strength are normal, no tremors or atrophy is present.  .  Neurological: coordination overall normal.  Deep tendon reflex/nerve stretch intact.  Sensation normal.  Cranial nerves II-XII intact.   Skin:   Normal overall no scars, lesions, ulcers or rashes. No psoriasis.  Psychiatric: Alert and oriented x 3.  Recent memory intact, remote memory unclear.  Normal mood and affect. Well groomed.  Good eye contact.  Cardiovascular: overall no swelling, no varicosities, no edema bilaterally, normal temperatures of the legs and arms, no clubbing, cyanosis and good capillary refill.  Lymphatic: palpation is normal.  Examination of left Upper Extremity is done.  Inspection:   Overall:  Elbow non-tender without crepitus or defects, forearm non-tender without crepitus or defects, wrist non-tender without crepitus or defects, hand non-tender.    Shoulder: with glenohumeral joint tenderness, without effusion.   Upper arm: without swelling and  tenderness   Range of motion:   Overall:  Full range of motion of the elbow, full range of motion of wrist and full range of motion in fingers.   Shoulder:  left  90 degrees forward flexion; 75 degrees abduction; 20 degrees internal rotation, 20 degrees external rotation, 10 degrees extension, 30 degrees adduction.   Stability:   Overall:  Shoulder, elbow and wrist stable   Strength and Tone:   Overall full shoulder muscles strength, full upper arm strength and normal upper arm bulk and tone.   The patient has been educated about the nature of the problem(s) and counseled on treatment options.  The patient appeared to understand what I have discussed and is in agreement with it.  Encounter Diagnosis  Name Primary?  . Acute pain of left shoulder Yes    PLAN Call if any problems.  Precautions discussed.  Continue current medications.   Return to clinic 3 weeks   Continue PT.  Electronically Signed Sanjuana Kava, MD 8/9/20189:43 AM

## 2017-03-30 NOTE — Therapy (Signed)
[facility] [place], [place], [address] Phone: [phone]   Fax:  [phone]  Physical Therapy Treatment  Patient Details  Name: Morgan Santos MRN: [id number] Date of Birth: May 06, 1951 Referring Provider: Sanjuana Kava MD  Encounter Date: 03/30/2017      PT End of Session - 03/30/17 1123    Visit Number 4   Number of Visits 16   Date for PT Re-Evaluation 05/20/17   PT Start Time 3662   PT Stop Time 1155   PT Time Calculation (min) 39 min      Past Medical History:  Diagnosis Date  . Anemia    when she was younger  . Anxiety   . Arthritis    hip- R, back; osteoarthritis  . Breast cancer ([place])   . Complication of anesthesia   . Constipation   . Depression    pt. remarks that she "takes respridal because if I don't take it I get angry"   . GERD (gastroesophageal reflux disease)    pt. uses vinegar for heartburn, ususally once per week   . H/O degenerative disc disease   . Hyperlipidemia   . Hypertension    "when I get upset"  Not on medication  . PONV (postoperative nausea and vomiting)    "just for one day"  . Stroke [facility]) ?2010 or 2011   2010, [facility] for stroke "mini stroke" short term memory problems since  . Wears dentures    top  . Wears glasses     Past Surgical History:  Procedure Laterality Date  . BREAST RECONSTRUCTION WITH PLACEMENT OF TISSUE EXPANDER AND FLEX HD (ACELLULAR HYDRATED DERMIS) Right 07/12/2013   Procedure: RIGHT BREAST RECONSTRUCTION WITH PLACEMENT OF TISSUE EXPANDER AND FLEX HD TO RIGHT BREAST (ACELLULAR HYDRATED DERMIS);  Surgeon: Irene Limbo, MD;  Location: [place];  Service: Plastics;  Laterality: Right;  . BREAST SURGERY     for abcesses- removed from both breasts, many yrs. ago  . CHOLECYSTECTOMY    . COLONOSCOPY   01/29/2004   NUR:A 7-8 mm polyp snared from the hepatic flexure.  Another 3 mm polyp was     cold snared from the sigmoid colon.External hemorrhoids,  possible source of recent rectal bleeding  . COLONOSCOPY N/A 06/05/2014   Procedure: COLONOSCOPY;  Surgeon: Danie Binder, MD;  [facility];  Service: Endoscopy;  Laterality: N/A;  1030 - moved to 10:45 - Ginger to notify pt   . DILATION AND CURETTAGE OF UTERUS    . LUMBAR SPINE SURGERY    . MASTECTOMY    . MASTECTOMY W/ SENTINEL NODE BIOPSY Right 07/12/2013   Procedure: MASTECTOMY WITH SENTINEL LYMPH NODE BIOPSY;  Surgeon: Rolm Bookbinder, MD;  Location: [place];  Service: General;  Laterality: Right;  . MASTOPEXY Left 01/14/2014   Procedure: LEFT BREAST MASTOPEXY FOR SYMMETRY ;  Surgeon: Irene Limbo, MD;  Location: [place];  Service: Plastics;  Laterality: Left;  . PORT-A-CATH REMOVAL Left 01/14/2014   Procedure: REMOVAL PORT-A-CATH;  Surgeon: Irene Limbo, MD;  Location: [place];  Service: Plastics;  Laterality: Left;  . PORTACATH PLACEMENT N/A 09/16/2013   Procedure: INSERTION PORT-A-CATH;  Surgeon: Rolm Bookbinder, MD;  Location: [place];  Service: General;  Laterality: N/A;  . REMOVAL OF BILATERAL TISSUE EXPANDERS WITH PLACEMENT OF BILATERAL BREAST IMPLANTS Right 01/14/2014   Procedure: REMOVAL OF RIGHT TISSUE EXPANDERS WITH PLACEMENT OF PERMANENT BREAST IMPLANT;  Surgeon: Irene Limbo, MD;  Location:  [place];  Service: Plastics;  Laterality: Right;  . TUBAL LIGATION    . VAGINAL HYSTERECTOMY      There were no vitals filed for this visit.      Subjective Assessment - 03/30/17 1121    Subjective Pt. noting she didn't get any sleep last night because she was worried after finding glass in hamburger she had for dinner last night.     Patient Stated Goals Use left arm again without pain.   Currently in Pain? No/denies   Pain Score 0-No pain   Multiple Pain Sites No            [facility] PT Assessment - 03/30/17 1132      Assessment   Next MD Visit 8.30.18                     [facility] Adult  PT Treatment/Exercise - 03/30/17 1135      Shoulder Exercises: Standing   External Rotation Left;20 reps;Theraband;Strengthening   Theraband Level (Shoulder External Rotation) Level 1 (Yellow)  with towel roll    Internal Rotation Left;Theraband;20 reps;Strengthening   Theraband Level (Shoulder Internal Rotation) Level 1 (Yellow)   Internal Rotation Limitations with towel roll    Flexion AAROM;15 reps;Left   ABduction AAROM;15 reps;Left   ABduction Limitations scaption; wall finger walk with gentle stretch   Extension 20 reps;Both;Theraband   Extension Limitations tactile cues for scapular squeeze    Row 20 reps;Theraband;Both   Row Limitations XTS pink      Shoulder Exercises: ROM/Strengthening   UBE (Upper Arm Bike) UBE: lvl 1, 6 min      Shoulder Exercises: Stretch   Other Shoulder Stretches L posterior shoulder stretch 2 x 30 sec                 PT Education - 03/30/17 1144    Education provided Yes   Education Details AAROM flexion abduction wall walk    Person(s) Educated Patient   Methods Explanation;Demonstration;Verbal cues;Handout   Comprehension Verbalized understanding;Returned demonstration;Verbal cues required;Need further instruction          PT Short Term Goals - 03/21/17 1301      PT SHORT TERM GOAL #1   Title STG's=LTG's.           PT Long Term Goals - 03/28/17 0941      PT LONG TERM GOAL #1   Title Pt will be independent in advanced HEP.    Time 8   Period Weeks   Status On-going     PT LONG TERM GOAL #2   Title Active left shoulder flexion to 150 degrees so the patient can easily reach overhead.   Time 8   Period Weeks   Status On-going     PT LONG TERM GOAL #3   Title Increase shoulder strength to a solid 5/5 to increase stability for performance of functional activities.   Time 8   Period Weeks   Status On-going     PT LONG TERM GOAL #4   Title Perform ADL's with left shoulder pain not > 2-3/10.   Time 8   Period Weeks    Status On-going               Plan - 03/30/17 1123    Clinical Impression Statement Pt. saw MD this morning with MD pleased with pt. progress.  Pt. tolerated progression of AAROM and neutral RTC band work well today.  HEP updated with  AAROM and posterior shoulder stretch.  Some manual work in posterior shoulder with pt. still ttp in [place] area.   PT Treatment/Interventions ADLs/Self Care Home Management;Cryotherapy;Electrical Stimulation;Moist Heat;Ultrasound;Therapeutic activities;Therapeutic exercise;Patient/family education;Manual techniques;Vasopneumatic Device;Dry needling   PT Next Visit Plan Monitor tolerance to updated HEP; STM to L shoulder posterior cuff region      Patient will benefit from skilled therapeutic intervention in order to improve the following deficits and impairments:  Decreased activity tolerance, Pain, Decreased strength  Visit Diagnosis: Acute pain of left shoulder  Muscle weakness (generalized)     Problem List Patient Active Problem List   Diagnosis Date Noted  . Disorder of left rotator cuff 03/13/2017  . Essential hypertension 03/13/2017  . Reactive airway disease that is not asthma 09/21/2016  . History of breast cancer 09/21/2016  . Depression 05/13/2016  . Memory loss 05/13/2016  . Gastroesophageal reflux disease without esophagitis 05/13/2016  . Hyperlipidemia 05/13/2016  . Body mass index 34.0-34.9, adult 05/13/2016  . Unspecified constipation 05/21/2014  . FH: colon cancer 05/21/2014  . Breast cancer of upper-outer quadrant of right female breast [facility]) 06/19/2013    Bess Harvest, PTA 03/30/17 12:34 PM  [facility] [place], [place], [address] Phone: [phone]   Fax:  [phone]  Name: Morgan Santos MRN: [id number] Date of Birth: 1951-01-24

## 2017-04-04 ENCOUNTER — Ambulatory Visit: Payer: 59 | Admitting: *Deleted

## 2017-04-04 DIAGNOSIS — M6281 Muscle weakness (generalized): Secondary | ICD-10-CM | POA: Diagnosis not present

## 2017-04-04 DIAGNOSIS — M25512 Pain in left shoulder: Secondary | ICD-10-CM | POA: Diagnosis not present

## 2017-04-04 NOTE — Therapy (Signed)
[facility] [place], [place], [address] Phone: [phone]   Fax:  [phone]  Physical Therapy Treatment  Patient Details  Name: Morgan Santos MRN: [id number] Date of Birth: 11/01/1950 Referring Provider: Sanjuana Kava MD  Encounter Date: 04/04/2017      PT End of Session - 04/04/17 1222    Visit Number 5   Number of Visits 16   Date for PT Re-Evaluation 05/20/17   PT Start Time 1200   PT Stop Time 1246   PT Time Calculation (min) 46 min   Activity Tolerance Patient tolerated treatment well   Behavior During Therapy [facility] for tasks assessed/performed      Past Medical History:  Diagnosis Date  . Anemia    when she was younger  . Anxiety   . Arthritis    hip- R, back; osteoarthritis  . Breast cancer ([place])   . Complication of anesthesia   . Constipation   . Depression    pt. remarks that she "takes respridal because if I don't take it I get angry"   . GERD (gastroesophageal reflux disease)    pt. uses vinegar for heartburn, ususally once per week   . H/O degenerative disc disease   . Hyperlipidemia   . Hypertension    "when I get upset"  Not on medication  . PONV (postoperative nausea and vomiting)    "just for one day"  . Stroke [facility]) ?2010 or 2011   2010, [facility] for stroke "mini stroke" short term memory problems since  . Wears dentures    top  . Wears glasses     Past Surgical History:  Procedure Laterality Date  . BREAST RECONSTRUCTION WITH PLACEMENT OF TISSUE EXPANDER AND FLEX HD (ACELLULAR HYDRATED DERMIS) Right 07/12/2013   Procedure: RIGHT BREAST RECONSTRUCTION WITH PLACEMENT OF TISSUE EXPANDER AND FLEX HD TO RIGHT BREAST (ACELLULAR HYDRATED DERMIS);  Surgeon: Irene Limbo, MD;  Location: [place];  Service: Plastics;  Laterality: Right;  . BREAST SURGERY     for abcesses- removed from both breasts, many yrs. ago  . CHOLECYSTECTOMY    . COLONOSCOPY   01/29/2004   NUR:A 7-8 mm polyp  snared from the hepatic flexure.  Another 3 mm polyp was     cold snared from the sigmoid colon.External hemorrhoids, possible source of recent rectal bleeding  . COLONOSCOPY N/A 06/05/2014   Procedure: COLONOSCOPY;  Surgeon: Danie Binder, MD;  [facility];  Service: Endoscopy;  Laterality: N/A;  1030 - moved to 10:45 - Ginger to notify pt   . DILATION AND CURETTAGE OF UTERUS    . LUMBAR SPINE SURGERY    . MASTECTOMY    . MASTECTOMY W/ SENTINEL NODE BIOPSY Right 07/12/2013   Procedure: MASTECTOMY WITH SENTINEL LYMPH NODE BIOPSY;  Surgeon: Rolm Bookbinder, MD;  Location: [place];  Service: General;  Laterality: Right;  . MASTOPEXY Left 01/14/2014   Procedure: LEFT BREAST MASTOPEXY FOR SYMMETRY ;  Surgeon: Irene Limbo, MD;  Location: [place];  Service: Plastics;  Laterality: Left;  . PORT-A-CATH REMOVAL Left 01/14/2014   Procedure: REMOVAL PORT-A-CATH;  Surgeon: Irene Limbo, MD;  Location: [place];  Service: Plastics;  Laterality: Left;  . PORTACATH PLACEMENT N/A 09/16/2013   Procedure: INSERTION PORT-A-CATH;  Surgeon: Rolm Bookbinder, MD;  Location: [place];  Service: General;  Laterality: N/A;  . REMOVAL OF BILATERAL TISSUE EXPANDERS WITH PLACEMENT OF BILATERAL BREAST IMPLANTS Right 01/14/2014   Procedure: REMOVAL  OF RIGHT TISSUE EXPANDERS WITH PLACEMENT OF PERMANENT BREAST IMPLANT;  Surgeon: Irene Limbo, MD;  Location: [place];  Service: Plastics;  Laterality: Right;  . TUBAL LIGATION    . VAGINAL HYSTERECTOMY      There were no vitals filed for this visit.      Subjective Assessment - 04/04/17 1218    Subjective Doing better witth LT shldr. 1-2/10 pain   Patient Stated Goals Use left arm again without pain.   Currently in Pain? Yes   Pain Score 1    Pain Orientation Left   Pain Type Acute pain   Pain Onset 1 to 4 weeks ago   Pain Frequency Constant                         [facility] Adult  PT Treatment/Exercise - 04/04/17 0001      Shoulder Exercises: Sidelying   External Rotation AROM;Left;20 reps   Flexion AROM;Left;20 reps   ABduction AROM;Left;20 reps     Shoulder Exercises: Standing   External Rotation Left;20 reps;Theraband;Strengthening   Theraband Level (Shoulder External Rotation) Level 1 (Yellow)  with towel roll    Internal Rotation Left;Theraband;20 reps;Strengthening   Theraband Level (Shoulder Internal Rotation) Level 1 (Yellow)   Internal Rotation Limitations with towel roll    Extension 20 reps;Both;Theraband     Shoulder Exercises: Pulleys   Flexion 3 minutes   Other Pulley Exercises standing UE ranger x 5 mins flexion and circles each     Shoulder Exercises: ROM/Strengthening   UBE (Upper Arm Bike) UBE: lvl 1, 6 min                   PT Short Term Goals - 03/21/17 1301      PT SHORT TERM GOAL #1   Title STG's=LTG's.           PT Long Term Goals - 03/28/17 0941      PT LONG TERM GOAL #1   Title Pt will be independent in advanced HEP.    Time 8   Period Weeks   Status On-going     PT LONG TERM GOAL #2   Title Active left shoulder flexion to 150 degrees so the patient can easily reach overhead.   Time 8   Period Weeks   Status On-going     PT LONG TERM GOAL #3   Title Increase shoulder strength to a solid 5/5 to increase stability for performance of functional activities.   Time 8   Period Weeks   Status On-going     PT LONG TERM GOAL #4   Title Perform ADL's with left shoulder pain not > 2-3/10.   Time 8   Period Weeks   Status On-going               Plan - 04/04/17 1253    Clinical Impression Statement Pt arrived today doing fairly well with LT shldr. She was able to raise her arm with less compensation. She was able to perform all therex with minimal discomfort. Sidelying exs were added today for ER and flexion   Clinical Decision Making Low   Rehab Potential Excellent   PT Frequency 2x / week   PT  Duration 8 weeks   PT Treatment/Interventions ADLs/Self Care Home Management;Cryotherapy;Electrical Stimulation;Moist Heat;Ultrasound;Therapeutic activities;Therapeutic exercise;Patient/family education;Manual techniques;Vasopneumatic Device;Dry needling   PT Next Visit Plan Monitor tolerance to updated HEP; STM to L shoulder posterior cuff region  Consulted and Agree with Plan of Care Patient      Patient will benefit from skilled therapeutic intervention in order to improve the following deficits and impairments:  Decreased activity tolerance, Pain, Decreased strength  Visit Diagnosis: Acute pain of left shoulder  Muscle weakness (generalized)     Problem List Patient Active Problem List   Diagnosis Date Noted  . Disorder of left rotator cuff 03/13/2017  . Essential hypertension 03/13/2017  . Reactive airway disease that is not asthma 09/21/2016  . History of breast cancer 09/21/2016  . Depression 05/13/2016  . Memory loss 05/13/2016  . Gastroesophageal reflux disease without esophagitis 05/13/2016  . Hyperlipidemia 05/13/2016  . Body mass index 34.0-34.9, adult 05/13/2016  . Unspecified constipation 05/21/2014  . FH: colon cancer 05/21/2014  . Breast cancer of upper-outer quadrant of right female breast ([place]) 06/19/2013    RAMSEUR,CHRIS, PTA 04/04/2017, 1:00 PM  [facility] [place], [place], [address] Phone: [phone]   Fax:  [phone]  Name: Morgan Santos MRN: [id number] Date of Birth: 06-29-1951

## 2017-04-08 ENCOUNTER — Other Ambulatory Visit: Payer: Self-pay | Admitting: Physician Assistant

## 2017-04-11 ENCOUNTER — Ambulatory Visit: Payer: 59 | Admitting: Physical Therapy

## 2017-04-11 DIAGNOSIS — M25512 Pain in left shoulder: Secondary | ICD-10-CM | POA: Diagnosis not present

## 2017-04-11 DIAGNOSIS — M6281 Muscle weakness (generalized): Secondary | ICD-10-CM | POA: Diagnosis not present

## 2017-04-11 NOTE — Therapy (Signed)
[facility] [place], [place], [address] Phone: [phone]   Fax:  [phone]  Physical Therapy Treatment  Patient Details  Name: Morgan Santos MRN: [id number] Date of Birth: Oct 16, 1950 Referring Provider: Sanjuana Kava MD  Encounter Date: 04/11/2017      PT End of Session - 04/11/17 0934    Visit Number 6   Number of Visits 16   Date for PT Re-Evaluation 05/20/17   PT Start Time 0904   PT Stop Time 0948   PT Time Calculation (min) 44 min   Activity Tolerance Patient tolerated treatment well   Behavior During Therapy [facility] for tasks assessed/performed      Past Medical History:  Diagnosis Date  . Anemia    when she was younger  . Anxiety   . Arthritis    hip- R, back; osteoarthritis  . Breast cancer ([place])   . Complication of anesthesia   . Constipation   . Depression    pt. remarks that she "takes respridal because if I don't take it I get angry"   . GERD (gastroesophageal reflux disease)    pt. uses vinegar for heartburn, ususally once per week   . H/O degenerative disc disease   . Hyperlipidemia   . Hypertension    "when I get upset"  Not on medication  . PONV (postoperative nausea and vomiting)    "just for one day"  . Stroke [facility]) ?2010 or 2011   2010, [facility] for stroke "mini stroke" short term memory problems since  . Wears dentures    top  . Wears glasses     Past Surgical History:  Procedure Laterality Date  . BREAST RECONSTRUCTION WITH PLACEMENT OF TISSUE EXPANDER AND FLEX HD (ACELLULAR HYDRATED DERMIS) Right 07/12/2013   Procedure: RIGHT BREAST RECONSTRUCTION WITH PLACEMENT OF TISSUE EXPANDER AND FLEX HD TO RIGHT BREAST (ACELLULAR HYDRATED DERMIS);  Surgeon: Irene Limbo, MD;  Location: [place];  Service: Plastics;  Laterality: Right;  . BREAST SURGERY     for abcesses- removed from both breasts, many yrs. ago  . CHOLECYSTECTOMY    . COLONOSCOPY   01/29/2004   NUR:A 7-8 mm polyp  snared from the hepatic flexure.  Another 3 mm polyp was     cold snared from the sigmoid colon.External hemorrhoids, possible source of recent rectal bleeding  . COLONOSCOPY N/A 06/05/2014   Procedure: COLONOSCOPY;  Surgeon: Danie Binder, MD;  [facility];  Service: Endoscopy;  Laterality: N/A;  1030 - moved to 10:45 - Ginger to notify pt   . DILATION AND CURETTAGE OF UTERUS    . LUMBAR SPINE SURGERY    . MASTECTOMY    . MASTECTOMY W/ SENTINEL NODE BIOPSY Right 07/12/2013   Procedure: MASTECTOMY WITH SENTINEL LYMPH NODE BIOPSY;  Surgeon: Rolm Bookbinder, MD;  Location: [place];  Service: General;  Laterality: Right;  . MASTOPEXY Left 01/14/2014   Procedure: LEFT BREAST MASTOPEXY FOR SYMMETRY ;  Surgeon: Irene Limbo, MD;  Location: [place];  Service: Plastics;  Laterality: Left;  . PORT-A-CATH REMOVAL Left 01/14/2014   Procedure: REMOVAL PORT-A-CATH;  Surgeon: Irene Limbo, MD;  Location: [place];  Service: Plastics;  Laterality: Left;  . PORTACATH PLACEMENT N/A 09/16/2013   Procedure: INSERTION PORT-A-CATH;  Surgeon: Rolm Bookbinder, MD;  Location: [place];  Service: General;  Laterality: N/A;  . REMOVAL OF BILATERAL TISSUE EXPANDERS WITH PLACEMENT OF BILATERAL BREAST IMPLANTS Right 01/14/2014   Procedure: REMOVAL  OF RIGHT TISSUE EXPANDERS WITH PLACEMENT OF PERMANENT BREAST IMPLANT;  Surgeon: Irene Limbo, MD;  Location: [place];  Service: Plastics;  Laterality: Right;  . TUBAL LIGATION    . VAGINAL HYSTERECTOMY      There were no vitals filed for this visit.      Subjective Assessment - 04/11/17 0908    Subjective Lt shoulder has been aching for the last 2-3 days.  Unsure why it's been so uncomfortable.  "It my be the arthritis setting in,"   Patient Stated Goals Use left arm again without pain.   Currently in Pain? Yes   Pain Score 5    Pain Location Shoulder   Pain Orientation Left   Pain Descriptors /  Indicators Aching   Pain Type Acute pain   Pain Onset 1 to 4 weeks ago   Pain Frequency Constant   Aggravating Factors  unknown, always aggravating in the morning   Pain Relieving Factors rest                         [facility] Adult PT Treatment/Exercise - 04/11/17 0909      Shoulder Exercises: Standing   External Rotation Left;15 reps;Theraband   Theraband Level (Shoulder External Rotation) Level 2 (Red)  cues to decrease substitution   Internal Rotation Left;15 reps;Theraband   Theraband Level (Shoulder Internal Rotation) Level 2 (Red)   Row Left;15 reps;Theraband   Theraband Level (Shoulder Row) Level 2 (Red)   Row Limitations min cues for technique     Shoulder Exercises: Pulleys   Flexion 3 minutes   Other Pulley Exercises standing UE ranger x 5 mins flexion and circles each     Shoulder Exercises: Therapy Ball   Flexion 10 reps   Flexion Limitations seated     Shoulder Exercises: ROM/Strengthening   UBE (Upper Arm Bike) UBE: lvl 1, 6 min (3' fwd/3' bwd)     Modalities   Modalities Electrical Stimulation;Moist Heat     Moist Heat Therapy   Number Minutes Moist Heat 15 Minutes   Moist Heat Location Shoulder     Electrical Stimulation   Electrical Stimulation Location Lt shoulder   Electrical Stimulation Action IFC   Electrical Stimulation Parameters to tolerance x 15 min   Electrical Stimulation Goals Pain;Tone                  PT Short Term Goals - 03/21/17 1301      PT SHORT TERM GOAL #1   Title STG's=LTG's.           PT Long Term Goals - 03/28/17 0941      PT LONG TERM GOAL #1   Title Pt will be independent in advanced HEP.    Time 8   Period Weeks   Status On-going     PT LONG TERM GOAL #2   Title Active left shoulder flexion to 150 degrees so the patient can easily reach overhead.   Time 8   Period Weeks   Status On-going     PT LONG TERM GOAL #3   Title Increase shoulder strength to a solid 5/5 to increase  stability for performance of functional activities.   Time 8   Period Weeks   Status On-going     PT LONG TERM GOAL #4   Title Perform ADL's with left shoulder pain not > 2-3/10.   Time 8   Period Weeks   Status On-going  Plan - 04/11/17 0935    Clinical Impression Statement Pt reports increased discomfort and aching x 2-3 days so initiated estim and heat today.  Progressing well with PT.  Will benefit from PT to maximize function.   PT Treatment/Interventions ADLs/Self Care Home Management;Cryotherapy;Electrical Stimulation;Moist Heat;Ultrasound;Therapeutic activities;Therapeutic exercise;Patient/family education;Manual techniques;Vasopneumatic Device;Dry needling   PT Next Visit Plan Monitor tolerance to updated HEP; STM to L shoulder posterior cuff region   Consulted and Agree with Plan of Care Patient      Patient will benefit from skilled therapeutic intervention in order to improve the following deficits and impairments:  Decreased activity tolerance, Pain, Decreased strength  Visit Diagnosis: Acute pain of left shoulder  Muscle weakness (generalized)     Problem List Patient Active Problem List   Diagnosis Date Noted  . Disorder of left rotator cuff 03/13/2017  . Essential hypertension 03/13/2017  . Reactive airway disease that is not asthma 09/21/2016  . History of breast cancer 09/21/2016  . Depression 05/13/2016  . Memory loss 05/13/2016  . Gastroesophageal reflux disease without esophagitis 05/13/2016  . Hyperlipidemia 05/13/2016  . Body mass index 34.0-34.9, adult 05/13/2016  . Unspecified constipation 05/21/2014  . FH: colon cancer 05/21/2014  . Breast cancer of upper-outer quadrant of right female breast ([place]) 06/19/2013      Laureen Abrahams, PT, DPT 04/11/17 9:38 AM    [facility] [place], [place], [address] Phone: [phone]   Fax:  [phone]  Name: Morgan Santos MRN: [id number] Date of Birth: Jun 07, 1951

## 2017-04-18 ENCOUNTER — Encounter: Payer: Self-pay | Admitting: Physical Therapy

## 2017-04-18 ENCOUNTER — Ambulatory Visit: Payer: 59 | Admitting: Physical Therapy

## 2017-04-18 DIAGNOSIS — M25512 Pain in left shoulder: Secondary | ICD-10-CM | POA: Diagnosis not present

## 2017-04-18 DIAGNOSIS — M6281 Muscle weakness (generalized): Secondary | ICD-10-CM | POA: Diagnosis not present

## 2017-04-18 NOTE — Therapy (Signed)
[facility] [place], [place], [address] Phone: [phone]   Fax:  [phone]  Physical Therapy Treatment  Patient Details  Name: Morgan Santos MRN: [id number] Date of Birth: 1950/11/15 Referring Provider: Sanjuana Kava MD  Encounter Date: 04/18/2017      PT End of Session - 04/18/17 0917    Visit Number 7   Number of Visits 16   Date for PT Re-Evaluation 05/20/17   PT Start Time 0907   PT Stop Time 0958   PT Time Calculation (min) 51 min   Activity Tolerance Patient tolerated treatment well   Behavior During Therapy [facility] for tasks assessed/performed      Past Medical History:  Diagnosis Date  . Anemia    when she was younger  . Anxiety   . Arthritis    hip- R, back; osteoarthritis  . Breast cancer ([place])   . Complication of anesthesia   . Constipation   . Depression    pt. remarks that she "takes respridal because if I don't take it I get angry"   . GERD (gastroesophageal reflux disease)    pt. uses vinegar for heartburn, ususally once per week   . H/O degenerative disc disease   . Hyperlipidemia   . Hypertension    "when I get upset"  Not on medication  . PONV (postoperative nausea and vomiting)    "just for one day"  . Stroke [facility]) ?2010 or 2011   2010, [facility] for stroke "mini stroke" short term memory problems since  . Wears dentures    top  . Wears glasses     Past Surgical History:  Procedure Laterality Date  . BREAST RECONSTRUCTION WITH PLACEMENT OF TISSUE EXPANDER AND FLEX HD (ACELLULAR HYDRATED DERMIS) Right 07/12/2013   Procedure: RIGHT BREAST RECONSTRUCTION WITH PLACEMENT OF TISSUE EXPANDER AND FLEX HD TO RIGHT BREAST (ACELLULAR HYDRATED DERMIS);  Surgeon: Irene Limbo, MD;  Location: [place];  Service: Plastics;  Laterality: Right;  . BREAST SURGERY     for abcesses- removed from both breasts, many yrs. ago  . CHOLECYSTECTOMY    . COLONOSCOPY   01/29/2004   NUR:A 7-8 mm polyp  snared from the hepatic flexure.  Another 3 mm polyp was     cold snared from the sigmoid colon.External hemorrhoids, possible source of recent rectal bleeding  . COLONOSCOPY N/A 06/05/2014   Procedure: COLONOSCOPY;  Surgeon: Danie Binder, MD;  [facility];  Service: Endoscopy;  Laterality: N/A;  1030 - moved to 10:45 - Ginger to notify pt   . DILATION AND CURETTAGE OF UTERUS    . LUMBAR SPINE SURGERY    . MASTECTOMY    . MASTECTOMY W/ SENTINEL NODE BIOPSY Right 07/12/2013   Procedure: MASTECTOMY WITH SENTINEL LYMPH NODE BIOPSY;  Surgeon: Rolm Bookbinder, MD;  Location: [place];  Service: General;  Laterality: Right;  . MASTOPEXY Left 01/14/2014   Procedure: LEFT BREAST MASTOPEXY FOR SYMMETRY ;  Surgeon: Irene Limbo, MD;  Location: [place];  Service: Plastics;  Laterality: Left;  . PORT-A-CATH REMOVAL Left 01/14/2014   Procedure: REMOVAL PORT-A-CATH;  Surgeon: Irene Limbo, MD;  Location: [place];  Service: Plastics;  Laterality: Left;  . PORTACATH PLACEMENT N/A 09/16/2013   Procedure: INSERTION PORT-A-CATH;  Surgeon: Rolm Bookbinder, MD;  Location: [place];  Service: General;  Laterality: N/A;  . REMOVAL OF BILATERAL TISSUE EXPANDERS WITH PLACEMENT OF BILATERAL BREAST IMPLANTS Right 01/14/2014   Procedure: REMOVAL  OF RIGHT TISSUE EXPANDERS WITH PLACEMENT OF PERMANENT BREAST IMPLANT;  Surgeon: Irene Limbo, MD;  Location: [place];  Service: Plastics;  Laterality: Right;  . TUBAL LIGATION    . VAGINAL HYSTERECTOMY      There were no vitals filed for this visit.      Subjective Assessment - 04/18/17 0911    Subjective Reports that she can reach up better but not all the time. Reports trying to not sleep on her left arm.   Patient Stated Goals Use left arm again without pain.   Currently in Pain? Yes   Pain Score 2    Pain Location Shoulder   Pain Orientation Left   Pain Descriptors / Indicators Aching   Pain  Type Acute pain   Pain Onset 1 to 4 weeks ago            [facility] PT Assessment - 04/18/17 0001      Assessment   Medical Diagnosis Acute left shoulder pain.   Onset Date/Surgical Date 03/12/17   Next MD Visit 04/20/2017     Precautions   Precautions None     Restrictions   Weight Bearing Restrictions No                     [facility] Adult PT Treatment/Exercise - 04/18/17 0001      Elbow Exercises   Elbow Flexion Strengthening;Left;20 reps;Seated;Bar weights/barbell   Bar Weights/Barbell (Elbow Flexion) 3 lbs     Shoulder Exercises: Seated   Flexion AROM;Both;20 reps     Shoulder Exercises: Standing   External Rotation Strengthening;Left;20 reps;Theraband   Theraband Level (Shoulder External Rotation) Level 2 (Red)   Internal Rotation Strengthening;Left;20 reps;Theraband   Theraband Level (Shoulder Internal Rotation) Level 2 (Red)   Flexion --   ABduction AAROM;Both;20 reps   Extension AAROM;Both;20 reps;Strengthening;Left;Theraband   Theraband Level (Shoulder Extension) Level 2 (Red)   Row Strengthening;Left;20 reps;Theraband   Theraband Level (Shoulder Row) Level 2 (Red)     Shoulder Exercises: Pulleys   Flexion Other (comment)  x5 min   Other Pulley Exercises standing UE ranger x 5 mins flexion and circles each   Other Pulley Exercises Wall ladder LUE x3 min     Shoulder Exercises: ROM/Strengthening   UBE (Upper Arm Bike) 90 RPM x5 min     Modalities   Modalities Electrical Stimulation;Moist Heat     Moist Heat Therapy   Number Minutes Moist Heat 15 Minutes   Moist Heat Location Shoulder     Electrical Stimulation   Electrical Stimulation Location L shoulder   Electrical Stimulation Action IFC   Electrical Stimulation Parameters 1-10 hz x15 min   Electrical Stimulation Goals Pain                  PT Short Term Goals - 03/21/17 1301      PT SHORT TERM GOAL #1   Title STG's=LTG's.           PT Long Term Goals - 03/28/17 0941       PT LONG TERM GOAL #1   Title Pt will be independent in advanced HEP.    Time 8   Period Weeks   Status On-going     PT LONG TERM GOAL #2   Title Active left shoulder flexion to 150 degrees so the patient can easily reach overhead.   Time 8   Period Weeks   Status On-going     PT LONG TERM GOAL #3  Title Increase shoulder strength to a solid 5/5 to increase stability for performance of functional activities.   Time 8   Period Weeks   Status On-going     PT LONG TERM GOAL #4   Title Perform ADL's with left shoulder pain not > 2-3/10.   Time 8   Period Weeks   Status On-going               Plan - 04/18/17 1002    Clinical Impression Statement Patient presented in clinic with low level L shoulder ache and improvement in active L shoulder flexion. Patient guided through [facility] exercises to improve functional ROM. Patient required tactile cueing for proper technique with resisted theraband exercises to avoid trunk rotation compensation. As fatigue sets in with seated AROM flexion R elbow flexion compensation presents. Normal modalities response noted following removal of the modalities.   Rehab Potential Excellent   PT Frequency 2x / week   PT Duration 8 weeks   PT Treatment/Interventions ADLs/Self Care Home Management;Cryotherapy;Electrical Stimulation;Moist Heat;Ultrasound;Therapeutic activities;Therapeutic exercise;Patient/family education;Manual techniques;Vasopneumatic Device;Dry needling   PT Next Visit Plan Monitor tolerance to updated HEP; STM to L shoulder posterior cuff region   Consulted and Agree with Plan of Care Patient      Patient will benefit from skilled therapeutic intervention in order to improve the following deficits and impairments:  Decreased activity tolerance, Pain, Decreased strength  Visit Diagnosis: Acute pain of left shoulder  Muscle weakness (generalized)     Problem List Patient Active Problem List   Diagnosis Date Noted  .  Disorder of left rotator cuff 03/13/2017  . Essential hypertension 03/13/2017  . Reactive airway disease that is not asthma 09/21/2016  . History of breast cancer 09/21/2016  . Depression 05/13/2016  . Memory loss 05/13/2016  . Gastroesophageal reflux disease without esophagitis 05/13/2016  . Hyperlipidemia 05/13/2016  . Body mass index 34.0-34.9, adult 05/13/2016  . Unspecified constipation 05/21/2014  . FH: colon cancer 05/21/2014  . Breast cancer of upper-outer quadrant of right female breast ([place]) 06/19/2013    Wynelle Fanny, PTA 04/18/2017, 10:18 AM  [facility] [place] [place], [place], [address] Phone: [phone]   Fax:  [phone]  Name: Morgan Santos MRN: [id number] Date of Birth: October 13, 1950

## 2017-04-20 ENCOUNTER — Ambulatory Visit (INDEPENDENT_AMBULATORY_CARE_PROVIDER_SITE_OTHER): Payer: 59 | Admitting: Orthopaedic Surgery

## 2017-04-20 ENCOUNTER — Encounter: Payer: Self-pay | Admitting: Physical Therapy

## 2017-04-20 ENCOUNTER — Ambulatory Visit: Payer: 59 | Admitting: Physical Therapy

## 2017-04-20 VITALS — BP 115/70 | HR 63 | Temp 97.6°F | Ht 69.25 in | Wt 239.0 lb

## 2017-04-20 DIAGNOSIS — M6281 Muscle weakness (generalized): Secondary | ICD-10-CM | POA: Diagnosis not present

## 2017-04-20 DIAGNOSIS — M25512 Pain in left shoulder: Secondary | ICD-10-CM

## 2017-04-20 NOTE — Progress Notes (Signed)
Patient YW:VPXTG Morgan Santos, female DOB:1951-08-20, 66 y.o. GYI:[id number]  Chief Complaint  Patient presents with  . Follow-up    Shoulder pain    HPI  Morgan Santos is a 66 y.o. female who has had shoulder pain on the left.  She is much improved.  She has been to PT and is very pleased with her progress.  She can stop it now if she wants. HPI  Body mass index is 35.04 kg/m.  ROS  Review of Systems  HENT: Negative for congestion.   Respiratory: Negative for cough and shortness of breath.   Cardiovascular: Negative for chest pain and leg swelling.  Endocrine: Positive for cold intolerance.  Musculoskeletal: Positive for arthralgias and joint swelling.  Allergic/Immunologic: Positive for environmental allergies.  Psychiatric/Behavioral: The patient is nervous/anxious.     Past Medical History:  Diagnosis Date  . Anemia    when she was younger  . Anxiety   . Arthritis    hip- R, back; osteoarthritis  . Breast cancer ([place])   . Complication of anesthesia   . Constipation   . Depression    pt. remarks that she "takes respridal because if I don't take it I get angry"   . GERD (gastroesophageal reflux disease)    pt. uses vinegar for heartburn, ususally once per week   . H/O degenerative disc disease   . Hyperlipidemia   . Hypertension    "when I get upset"  Not on medication  . PONV (postoperative nausea and vomiting)    "just for one day"  . Stroke [facility]) ?2010 or 2011   2010, [facility] for stroke "mini stroke" short term memory problems since  . Wears dentures    top  . Wears glasses     Past Surgical History:  Procedure Laterality Date  . BREAST RECONSTRUCTION WITH PLACEMENT OF TISSUE EXPANDER AND FLEX HD (ACELLULAR HYDRATED DERMIS) Right 07/12/2013   Procedure: RIGHT BREAST RECONSTRUCTION WITH PLACEMENT OF TISSUE EXPANDER AND FLEX HD TO RIGHT BREAST (ACELLULAR HYDRATED DERMIS);  Surgeon: Irene Limbo, MD;  Location: [place];  Service: Plastics;  Laterality:  Right;  . BREAST SURGERY     for abcesses- removed from both breasts, many yrs. ago  . CHOLECYSTECTOMY    . COLONOSCOPY   01/29/2004   NUR:A 7-8 mm polyp snared from the hepatic flexure.  Another 3 mm polyp was     cold snared from the sigmoid colon.External hemorrhoids, possible source of recent rectal bleeding  . COLONOSCOPY N/A 06/05/2014   Procedure: COLONOSCOPY;  Surgeon: Danie Binder, MD;  [facility];  Service: Endoscopy;  Laterality: N/A;  1030 - moved to 10:45 - Ginger to notify pt   . DILATION AND CURETTAGE OF UTERUS    . LUMBAR SPINE SURGERY    . MASTECTOMY    . MASTECTOMY W/ SENTINEL NODE BIOPSY Right 07/12/2013   Procedure: MASTECTOMY WITH SENTINEL LYMPH NODE BIOPSY;  Surgeon: Rolm Bookbinder, MD;  Location: [place];  Service: General;  Laterality: Right;  . MASTOPEXY Left 01/14/2014   Procedure: LEFT BREAST MASTOPEXY FOR SYMMETRY ;  Surgeon: Irene Limbo, MD;  Location: [place];  Service: Plastics;  Laterality: Left;  . PORT-A-CATH REMOVAL Left 01/14/2014   Procedure: REMOVAL PORT-A-CATH;  Surgeon: Irene Limbo, MD;  Location: [place];  Service: Plastics;  Laterality: Left;  . PORTACATH PLACEMENT N/A 09/16/2013   Procedure: INSERTION PORT-A-CATH;  Surgeon: Rolm Bookbinder, MD;  Location: [place];  Service: General;  Laterality: N/A;  . REMOVAL OF BILATERAL TISSUE EXPANDERS WITH PLACEMENT OF BILATERAL BREAST IMPLANTS Right 01/14/2014   Procedure: REMOVAL OF RIGHT TISSUE EXPANDERS WITH PLACEMENT OF PERMANENT BREAST IMPLANT;  Surgeon: Irene Limbo, MD;  Location: [place];  Service: Plastics;  Laterality: Right;  . TUBAL LIGATION    . VAGINAL HYSTERECTOMY      Family History  Problem Relation Age of Onset  . Hypertension Mother   . Diabetes type II Mother   . CVA Mother   . Heart disease Mother   . Osteoarthritis Mother   . Depression Mother   . Other Mother        degenerative disc disease  .  Alcoholism Father   . Pneumonia Father   . Colon cancer Maternal Aunt        age 39s  . Colon cancer Other        maternal great aunt    Social History Social History  Substance Use Topics  . Smoking status: Former Smoker    Quit date: 09/14/1995  . Smokeless tobacco: Never Used  . Alcohol use No    Allergies  Allergen Reactions  . Codeine Nausea And Vomiting  . Crestor [Rosuvastatin Calcium]   . Lipitor [Atorvastatin]   . Multihance [Gadobenate] Nausea Only and Cough    PT HAD INCREASED MUCOUS PRODUCTION AND PHLEGMY COUGH LASTING 10 MINS AFTER INJECTION, PT SENT HOME WITH BENADRYL, PREV NAUSEA AFTER GAD    Current Outpatient Prescriptions  Medication Sig Dispense Refill  . ALPRAZolam (XANAX) 0.5 MG tablet TAKE 1 TABLET BY MOUTH TWICE DAILY AS NEEDED 60 tablet 2  . anastrozole (ARIMIDEX) 1 MG tablet TAKE ONE TABLET BY MOUTH ONCE DAILY 90 tablet 3  . budesonide-formoterol (SYMBICORT) 160-4.5 MCG/ACT inhaler Inhale 1 puff into the lungs 2 (two) times daily. 1 Inhaler 3  . Calcium-Magnesium-Zinc 500-250-12.5 MG TABS Take by mouth 2 (two) times daily.    . Cholecalciferol (VITAMIN D-3) 5000 UNITS TABS Take 2 each by mouth daily.    . citalopram (CELEXA) 40 MG tablet Take 1 tablet (40 mg total) by mouth daily. 90 tablet 0  . donepezil (ARICEPT) 10 MG tablet Take 10 mg by mouth daily.    . Fluticasone-Salmeterol (ADVAIR DISKUS) 250-50 MCG/DOSE AEPB Inhale 1 puff into the lungs 2 (two) times daily. 1 each 3  . gabapentin (NEURONTIN) 300 MG capsule TAKE 1 CAPSULE BY MOUTH TWICE DAILY (MUST BE SEEN FOR REFILLS) 60 capsule 0  . HYDROcodone-acetaminophen (NORCO) 10-325 MG tablet Take 1-2 tablets by mouth every 6 (six) hours as needed. 40 tablet 0  . lisinopril (PRINIVIL,ZESTRIL) 10 MG tablet Take 1 tablet (10 mg total) by mouth daily. 90 tablet 0  . meloxicam (MOBIC) 7.5 MG tablet Take 1 tablet (7.5 mg total) by mouth daily. (Patient not taking: Reported on 03/21/2017) 30 tablet 5  .  memantine (NAMENDA) 10 MG tablet Take 10 mg by mouth 2 (two) times daily.    . naproxen (NAPROSYN) 500 MG tablet Take 1 tablet (500 mg total) by mouth 2 (two) times daily with a meal. 60 tablet 5  . omeprazole (PRILOSEC) 20 MG capsule Take 20 mg by mouth daily.     . pravastatin (PRAVACHOL) 40 MG tablet TAKE ONE TABLET BY MOUTH ONCE DAILY 30 tablet 0  . predniSONE (STERAPRED UNI-PAK 48 TAB) 10 MG (48) TBPK tablet Take as directed for 12 days 48 tablet 0  . vitamin B-12 (CYANOCOBALAMIN) 100 MCG tablet Take 100 mcg by  mouth daily.     No current facility-administered medications for this visit.      Physical Exam  Blood pressure 115/70, pulse 63, temperature 97.6 F (36.4 C), height 5' 9.25" (1.759 m), weight 239 lb (108.4 kg).  Constitutional: overall normal hygiene, normal nutrition, well developed, normal grooming, normal body habitus. Assistive device:none  Musculoskeletal: gait and station Limp none, muscle tone and strength are normal, no tremors or atrophy is present.  .  Neurological: coordination overall normal.  Deep tendon reflex/nerve stretch intact.  Sensation normal.  Cranial nerves II-XII intact.   Skin:   Normal overall no scars, lesions, ulcers or rashes. No psoriasis.  Psychiatric: Alert and oriented x 3.  Recent memory intact, remote memory unclear.  Normal mood and affect. Well groomed.  Good eye contact.  Cardiovascular: overall no swelling, no varicosities, no edema bilaterally, normal temperatures of the legs and arms, no clubbing, cyanosis and good capillary refill.  Lymphatic: palpation is normal.  Left shoulder has full painless ROM with no crepitus, no swelling and normal strength and tone.  The patient has been educated about the nature of the problem(s) and counseled on treatment options.  The patient appeared to understand what I have discussed and is in agreement with it.  Encounter Diagnosis  Name Primary?  . Acute pain of left shoulder Yes     PLAN Call if any problems.  Precautions discussed.  Continue current medications.   Return to clinic 6 weeks   Electronically Signed Sanjuana Kava, MD 8/30/20189:16 AM

## 2017-04-20 NOTE — Therapy (Signed)
[facility] [place], [place], [address] Phone: [phone]   Fax:  [phone]  Physical Therapy Treatment  Patient Details  Name: Morgan Santos MRN: [id number] Date of Birth: 05-28-1951 Referring Provider: Sanjuana Kava MD  Encounter Date: 04/20/2017      PT End of Session - 04/20/17 1131    Visit Number 8   Number of Visits 16   Date for PT Re-Evaluation 05/20/17   PT Start Time 1116   PT Stop Time 1200   PT Time Calculation (min) 44 min   Activity Tolerance Patient tolerated treatment well   Behavior During Therapy [facility] for tasks assessed/performed      Past Medical History:  Diagnosis Date  . Anemia    when she was younger  . Anxiety   . Arthritis    hip- R, back; osteoarthritis  . Breast cancer ([place])   . Complication of anesthesia   . Constipation   . Depression    pt. remarks that she "takes respridal because if I don't take it I get angry"   . GERD (gastroesophageal reflux disease)    pt. uses vinegar for heartburn, ususally once per week   . H/O degenerative disc disease   . Hyperlipidemia   . Hypertension    "when I get upset"  Not on medication  . PONV (postoperative nausea and vomiting)    "just for one day"  . Stroke [facility]) ?2010 or 2011   2010, [facility] for stroke "mini stroke" short term memory problems since  . Wears dentures    top  . Wears glasses     Past Surgical History:  Procedure Laterality Date  . BREAST RECONSTRUCTION WITH PLACEMENT OF TISSUE EXPANDER AND FLEX HD (ACELLULAR HYDRATED DERMIS) Right 07/12/2013   Procedure: RIGHT BREAST RECONSTRUCTION WITH PLACEMENT OF TISSUE EXPANDER AND FLEX HD TO RIGHT BREAST (ACELLULAR HYDRATED DERMIS);  Surgeon: Irene Limbo, MD;  Location: [place];  Service: Plastics;  Laterality: Right;  . BREAST SURGERY     for abcesses- removed from both breasts, many yrs. ago  . CHOLECYSTECTOMY    . COLONOSCOPY   01/29/2004   NUR:A 7-8 mm polyp  snared from the hepatic flexure.  Another 3 mm polyp was     cold snared from the sigmoid colon.External hemorrhoids, possible source of recent rectal bleeding  . COLONOSCOPY N/A 06/05/2014   Procedure: COLONOSCOPY;  Surgeon: Danie Binder, MD;  [facility];  Service: Endoscopy;  Laterality: N/A;  1030 - moved to 10:45 - Ginger to notify pt   . DILATION AND CURETTAGE OF UTERUS    . LUMBAR SPINE SURGERY    . MASTECTOMY    . MASTECTOMY W/ SENTINEL NODE BIOPSY Right 07/12/2013   Procedure: MASTECTOMY WITH SENTINEL LYMPH NODE BIOPSY;  Surgeon: Rolm Bookbinder, MD;  Location: [place];  Service: General;  Laterality: Right;  . MASTOPEXY Left 01/14/2014   Procedure: LEFT BREAST MASTOPEXY FOR SYMMETRY ;  Surgeon: Irene Limbo, MD;  Location: [place];  Service: Plastics;  Laterality: Left;  . PORT-A-CATH REMOVAL Left 01/14/2014   Procedure: REMOVAL PORT-A-CATH;  Surgeon: Irene Limbo, MD;  Location: [place];  Service: Plastics;  Laterality: Left;  . PORTACATH PLACEMENT N/A 09/16/2013   Procedure: INSERTION PORT-A-CATH;  Surgeon: Rolm Bookbinder, MD;  Location: [place];  Service: General;  Laterality: N/A;  . REMOVAL OF BILATERAL TISSUE EXPANDERS WITH PLACEMENT OF BILATERAL BREAST IMPLANTS Right 01/14/2014   Procedure: REMOVAL  OF RIGHT TISSUE EXPANDERS WITH PLACEMENT OF PERMANENT BREAST IMPLANT;  Surgeon: Irene Limbo, MD;  Location: [place];  Service: Plastics;  Laterality: Right;  . TUBAL LIGATION    . VAGINAL HYSTERECTOMY      There were no vitals filed for this visit.      Subjective Assessment - 04/20/17 1119    Subjective Patient went to MD today and is doing well overall, would like to cont a few weeks   Patient Stated Goals Use left arm again without pain.   Currently in Pain? Yes   Pain Score 2    Pain Location Shoulder   Pain Orientation Left   Pain Descriptors / Indicators Aching   Pain Type Acute pain    Pain Onset More than a month ago   Pain Frequency Constant   Aggravating Factors  unknown   Pain Relieving Factors rest            [facility] PT Assessment - 04/20/17 0001      ROM / Strength   AROM / PROM / Strength AROM     AROM   AROM Assessment Site Shoulder   Right/Left Shoulder Left   Left Shoulder Flexion 160 Degrees                     [facility] Adult PT Treatment/Exercise - 04/20/17 0001      Shoulder Exercises: Standing   Protraction Strengthening;Left;20 reps;Theraband   Theraband Level (Shoulder Protraction) Level 2 (Red)   External Rotation Strengthening;Left;20 reps;Theraband   Theraband Level (Shoulder External Rotation) Level 2 (Red)   Internal Rotation Strengthening;Left;20 reps;Theraband   Theraband Level (Shoulder Internal Rotation) Level 2 (Red)   Row Strengthening;Left;20 reps;Theraband   Theraband Level (Shoulder Row) Level 2 (Red)     Shoulder Exercises: Pulleys   Flexion Other (comment)  56mn   Other Pulley Exercises wall slide with lift off and lower for eccentic control x10     Shoulder Exercises: ROM/Strengthening   UBE (Upper Arm Bike) 90 RPM x5 min   Wall Wash 2x20   Wall Pushups 20 reps     Moist Heat Therapy   Number Minutes Moist Heat 15 Minutes   Moist Heat Location Shoulder     Electrical Stimulation   Electrical Stimulation Location L shoulder   Electrical Stimulation Action IFC   Electrical Stimulation Parameters 1-_0  x135m   Electrical Stimulation Goals Pain                  PT Short Term Goals - 03/21/17 1301      PT SHORT TERM GOAL #1   Title STG's=LTG's.           PT Long Term Goals - 04/20/17 1133      PT LONG TERM GOAL #1   Title Pt will be independent in advanced HEP.    Time 8   Period Weeks   Status On-going     PT LONG TERM GOAL #2   Title Active left shoulder flexion to 150 degrees so the patient can easily reach overhead.   Time 8   Period Weeks   Status Achieved  AROM 160  degrees 04/20/17     PT LONG TERM GOAL #3   Title Increase shoulder strength to a solid 5/5 to increase stability for performance of functional activities.   Time 8   Period Weeks   Status On-going     PT LONG TERM GOAL #4   Title Perform  ADL's with left shoulder pain not > 2-3/10.   Time 8   Period Weeks   Status On-going               Plan - 04/20/17 1138    Clinical Impression Statement Patient tolerated treatment well today. Patient has improved with full active flexion today. Patient has some fatigue with left shoulder strengthening progression. Patient required cues for correct technique with exercises due to compensation. Patient met ROM goal today, other goals ongoing due to pain and strength deficts.    Rehab Potential Excellent   PT Frequency 2x / week   PT Duration 8 weeks   PT Treatment/Interventions ADLs/Self Care Home Management;Cryotherapy;Electrical Stimulation;Moist Heat;Ultrasound;Therapeutic activities;Therapeutic exercise;Patient/family education;Manual techniques;Vasopneumatic Device;Dry needling   PT Next Visit Plan cont with POC for strengthening and modalities PRN   Consulted and Agree with Plan of Care Patient      Patient will benefit from skilled therapeutic intervention in order to improve the following deficits and impairments:  Decreased activity tolerance, Pain, Decreased strength  Visit Diagnosis: Acute pain of left shoulder  Muscle weakness (generalized)     Problem List Patient Active Problem List   Diagnosis Date Noted  . Disorder of left rotator cuff 03/13/2017  . Essential hypertension 03/13/2017  . Reactive airway disease that is not asthma 09/21/2016  . History of breast cancer 09/21/2016  . Depression 05/13/2016  . Memory loss 05/13/2016  . Gastroesophageal reflux disease without esophagitis 05/13/2016  . Hyperlipidemia 05/13/2016  . Body mass index 34.0-34.9, adult 05/13/2016  . Unspecified constipation 05/21/2014  . FH:  colon cancer 05/21/2014  . Breast cancer of upper-outer quadrant of right female breast ([place]) 06/19/2013    Morgan Santos P, PTA 04/20/2017, 12:00 PM  [facility] [place], [place], [address] Phone: [phone]   Fax:  [phone]  Name: Morgan Santos MRN: [id number] Date of Birth: February 16, 1951

## 2017-04-27 ENCOUNTER — Ambulatory Visit: Payer: 59 | Attending: Orthopaedic Surgery | Admitting: Physical Therapy

## 2017-04-27 ENCOUNTER — Encounter: Payer: Self-pay | Admitting: Physical Therapy

## 2017-04-27 DIAGNOSIS — M25512 Pain in left shoulder: Secondary | ICD-10-CM | POA: Diagnosis not present

## 2017-04-27 DIAGNOSIS — M6281 Muscle weakness (generalized): Secondary | ICD-10-CM

## 2017-04-27 NOTE — Therapy (Signed)
[facility] [place], [place], [address] Phone: [phone]   Fax:  [phone]  Physical Therapy Treatment  Patient Details  Name: Morgan Santos MRN: [id number] Date of Birth: 11/06/1950 Referring Provider: Sanjuana Kava MD  Encounter Date: 04/27/2017      PT End of Session - 04/27/17 1008    Visit Number 9   Number of Visits 16   Date for PT Re-Evaluation 05/20/17   PT Start Time 1005   PT Stop Time 1038   PT Time Calculation (min) 33 min   Activity Tolerance Patient tolerated treatment well   Behavior During Therapy [facility] for tasks assessed/performed      Past Medical History:  Diagnosis Date  . Anemia    when she was younger  . Anxiety   . Arthritis    hip- R, back; osteoarthritis  . Breast cancer ([place])   . Complication of anesthesia   . Constipation   . Depression    pt. remarks that she "takes respridal because if I don't take it I get angry"   . GERD (gastroesophageal reflux disease)    pt. uses vinegar for heartburn, ususally once per week   . H/O degenerative disc disease   . Hyperlipidemia   . Hypertension    "when I get upset"  Not on medication  . PONV (postoperative nausea and vomiting)    "just for one day"  . Stroke [facility]) ?2010 or 2011   2010, [facility] for stroke "mini stroke" short term memory problems since  . Wears dentures    top  . Wears glasses     Past Surgical History:  Procedure Laterality Date  . BREAST RECONSTRUCTION WITH PLACEMENT OF TISSUE EXPANDER AND FLEX HD (ACELLULAR HYDRATED DERMIS) Right 07/12/2013   Procedure: RIGHT BREAST RECONSTRUCTION WITH PLACEMENT OF TISSUE EXPANDER AND FLEX HD TO RIGHT BREAST (ACELLULAR HYDRATED DERMIS);  Surgeon: Irene Limbo, MD;  Location: [place];  Service: Plastics;  Laterality: Right;  . BREAST SURGERY     for abcesses- removed from both breasts, many yrs. ago  . CHOLECYSTECTOMY    . COLONOSCOPY   01/29/2004   NUR:A 7-8 mm polyp  snared from the hepatic flexure.  Another 3 mm polyp was     cold snared from the sigmoid colon.External hemorrhoids, possible source of recent rectal bleeding  . COLONOSCOPY N/A 06/05/2014   Procedure: COLONOSCOPY;  Surgeon: Danie Binder, MD;  [facility];  Service: Endoscopy;  Laterality: N/A;  1030 - moved to 10:45 - Ginger to notify pt   . DILATION AND CURETTAGE OF UTERUS    . LUMBAR SPINE SURGERY    . MASTECTOMY    . MASTECTOMY W/ SENTINEL NODE BIOPSY Right 07/12/2013   Procedure: MASTECTOMY WITH SENTINEL LYMPH NODE BIOPSY;  Surgeon: Rolm Bookbinder, MD;  Location: [place];  Service: General;  Laterality: Right;  . MASTOPEXY Left 01/14/2014   Procedure: LEFT BREAST MASTOPEXY FOR SYMMETRY ;  Surgeon: Irene Limbo, MD;  Location: [place];  Service: Plastics;  Laterality: Left;  . PORT-A-CATH REMOVAL Left 01/14/2014   Procedure: REMOVAL PORT-A-CATH;  Surgeon: Irene Limbo, MD;  Location: [place];  Service: Plastics;  Laterality: Left;  . PORTACATH PLACEMENT N/A 09/16/2013   Procedure: INSERTION PORT-A-CATH;  Surgeon: Rolm Bookbinder, MD;  Location: [place];  Service: General;  Laterality: N/A;  . REMOVAL OF BILATERAL TISSUE EXPANDERS WITH PLACEMENT OF BILATERAL BREAST IMPLANTS Right 01/14/2014   Procedure: REMOVAL  OF RIGHT TISSUE EXPANDERS WITH PLACEMENT OF PERMANENT BREAST IMPLANT;  Surgeon: Irene Limbo, MD;  Location: [place];  Service: Plastics;  Laterality: Right;  . TUBAL LIGATION    . VAGINAL HYSTERECTOMY      There were no vitals filed for this visit.      Subjective Assessment - 04/27/17 1007    Subjective Reports that she fell and hurt her foot, L knee and chin and states that her shoulder is doing okay.   Patient Stated Goals Use left arm again without pain.   Currently in Pain? Yes   Pain Score 2    Pain Location Shoulder   Pain Orientation Left   Pain Descriptors / Indicators Discomfort   Pain  Type Acute pain   Pain Onset More than a month ago            [facility] PT Assessment - 04/27/17 0001      Assessment   Medical Diagnosis Acute left shoulder pain.   Onset Date/Surgical Date 03/12/17   Next MD Visit 06/01/2017     Precautions   Precautions None     Restrictions   Weight Bearing Restrictions No                     [facility] Adult PT Treatment/Exercise - 04/27/17 0001      Shoulder Exercises: Standing   External Rotation Strengthening;Left;20 reps;Theraband   Theraband Level (Shoulder External Rotation) Level 2 (Red)   Internal Rotation Strengthening;Left;20 reps;Theraband   Theraband Level (Shoulder Internal Rotation) Level 2 (Red)   Extension Strengthening;Right;20 reps;Theraband   Theraband Level (Shoulder Extension) Level 2 (Red)     Shoulder Exercises: Pulleys   Other Pulley Exercises UE ranger into flexion x30 reps     Shoulder Exercises: ROM/Strengthening   UBE (Upper Arm Bike) 120 RPM x5 min     Modalities   Modalities Electrical Stimulation;Moist Heat     Moist Heat Therapy   Number Minutes Moist Heat 15 Minutes   Moist Heat Location Shoulder     Electrical Stimulation   Electrical Stimulation Location L shoulder   Electrical Stimulation Action IFC   Electrical Stimulation Parameters 1-10 hz x15 min   Electrical Stimulation Goals Pain                  PT Short Term Goals - 03/21/17 1301      PT SHORT TERM GOAL #1   Title STG's=LTG's.           PT Long Term Goals - 04/20/17 1133      PT LONG TERM GOAL #1   Title Pt will be independent in advanced HEP.    Time 8   Period Weeks   Status On-going     PT LONG TERM GOAL #2   Title Active left shoulder flexion to 150 degrees so the patient can easily reach overhead.   Time 8   Period Weeks   Status Achieved  AROM 160 degrees 04/20/17     PT LONG TERM GOAL #3   Title Increase shoulder strength to a solid 5/5 to increase stability for performance of functional  activities.   Time 8   Period Weeks   Status On-going     PT LONG TERM GOAL #4   Title Perform ADL's with left shoulder pain not > 2-3/10.   Time 8   Period Weeks   Status On-going  Plan - 04/27/17 1102    Clinical Impression Statement Patient tolerated today's treatment fairly well although she was moving slow secondary to fall. Patient able to complete exercises without any shoulder discomfort reports. No abnormal compensatory strategies observed today in treatment. Normal modalities response noted following removal of the modaliites.   Rehab Potential Excellent   PT Frequency 2x / week   PT Duration 8 weeks   PT Treatment/Interventions ADLs/Self Care Home Management;Cryotherapy;Electrical Stimulation;Moist Heat;Ultrasound;Therapeutic activities;Therapeutic exercise;Patient/family education;Manual techniques;Vasopneumatic Device;Dry needling   PT Next Visit Plan cont with POC for strengthening and modalities PRN   Consulted and Agree with Plan of Care Patient      Patient will benefit from skilled therapeutic intervention in order to improve the following deficits and impairments:  Decreased activity tolerance, Pain, Decreased strength  Visit Diagnosis: Acute pain of left shoulder  Muscle weakness (generalized)     Problem List Patient Active Problem List   Diagnosis Date Noted  . Disorder of left rotator cuff 03/13/2017  . Essential hypertension 03/13/2017  . Reactive airway disease that is not asthma 09/21/2016  . History of breast cancer 09/21/2016  . Depression 05/13/2016  . Memory loss 05/13/2016  . Gastroesophageal reflux disease without esophagitis 05/13/2016  . Hyperlipidemia 05/13/2016  . Body mass index 34.0-34.9, adult 05/13/2016  . Unspecified constipation 05/21/2014  . FH: colon cancer 05/21/2014  . Breast cancer of upper-outer quadrant of right female breast ([place]) 06/19/2013    Wynelle Fanny, PTA 04/27/2017, 11:19 AM  [facility] [place], [place], [address] Phone: [phone]   Fax:  [phone]  Name: MIYO AINA MRN: [id number] Date of Birth: 1950-10-16

## 2017-05-02 ENCOUNTER — Encounter: Payer: Self-pay | Admitting: Gastroenterology

## 2017-05-03 ENCOUNTER — Ambulatory Visit: Payer: Medicare Other | Admitting: Physician Assistant

## 2017-05-04 ENCOUNTER — Encounter: Payer: Self-pay | Admitting: Physician Assistant

## 2017-05-04 ENCOUNTER — Encounter: Payer: Self-pay | Admitting: Physical Therapy

## 2017-05-04 ENCOUNTER — Ambulatory Visit: Payer: 59 | Admitting: Physical Therapy

## 2017-05-04 DIAGNOSIS — M6281 Muscle weakness (generalized): Secondary | ICD-10-CM | POA: Diagnosis not present

## 2017-05-04 DIAGNOSIS — M25512 Pain in left shoulder: Secondary | ICD-10-CM | POA: Diagnosis not present

## 2017-05-04 DIAGNOSIS — Z029 Encounter for administrative examinations, unspecified: Secondary | ICD-10-CM

## 2017-05-04 NOTE — Therapy (Signed)
[facility] [place], [place], [address] Phone: [phone]   Fax:  [phone]  Physical Therapy Treatment  Patient Details  Name: Morgan Santos MRN: [id number] Date of Birth: 07/27/1951 Referring Provider: Sanjuana Kava MD  Encounter Date: 05/04/2017      PT End of Session - 05/04/17 1412    PT Start Time 0110   PT Stop Time 0203   PT Time Calculation (min) 53 min   Activity Tolerance Patient tolerated treatment well   Behavior During Therapy [facility] for tasks assessed/performed      Past Medical History:  Diagnosis Date  . Anemia    when she was younger  . Anxiety   . Arthritis    hip- R, back; osteoarthritis  . Breast cancer ([place])   . Complication of anesthesia   . Constipation   . Depression    pt. remarks that she "takes respridal because if I don't take it I get angry"   . GERD (gastroesophageal reflux disease)    pt. uses vinegar for heartburn, ususally once per week   . H/O degenerative disc disease   . Hyperlipidemia   . Hypertension    "when I get upset"  Not on medication  . PONV (postoperative nausea and vomiting)    "just for one day"  . Stroke [facility]) ?2010 or 2011   2010, [facility] for stroke "mini stroke" short term memory problems since  . Wears dentures    top  . Wears glasses     Past Surgical History:  Procedure Laterality Date  . BREAST RECONSTRUCTION WITH PLACEMENT OF TISSUE EXPANDER AND FLEX HD (ACELLULAR HYDRATED DERMIS) Right 07/12/2013   Procedure: RIGHT BREAST RECONSTRUCTION WITH PLACEMENT OF TISSUE EXPANDER AND FLEX HD TO RIGHT BREAST (ACELLULAR HYDRATED DERMIS);  Surgeon: Irene Limbo, MD;  Location: [place];  Service: Plastics;  Laterality: Right;  . BREAST SURGERY     for abcesses- removed from both breasts, many yrs. ago  . CHOLECYSTECTOMY    . COLONOSCOPY   01/29/2004   NUR:A 7-8 mm polyp snared from the hepatic flexure.  Another 3 mm polyp was     cold snared from  the sigmoid colon.External hemorrhoids, possible source of recent rectal bleeding  . COLONOSCOPY N/A 06/05/2014   Procedure: COLONOSCOPY;  Surgeon: Danie Binder, MD;  [facility];  Service: Endoscopy;  Laterality: N/A;  1030 - moved to 10:45 - Ginger to notify pt   . DILATION AND CURETTAGE OF UTERUS    . LUMBAR SPINE SURGERY    . MASTECTOMY    . MASTECTOMY W/ SENTINEL NODE BIOPSY Right 07/12/2013   Procedure: MASTECTOMY WITH SENTINEL LYMPH NODE BIOPSY;  Surgeon: Rolm Bookbinder, MD;  Location: [place];  Service: General;  Laterality: Right;  . MASTOPEXY Left 01/14/2014   Procedure: LEFT BREAST MASTOPEXY FOR SYMMETRY ;  Surgeon: Irene Limbo, MD;  Location: [place];  Service: Plastics;  Laterality: Left;  . PORT-A-CATH REMOVAL Left 01/14/2014   Procedure: REMOVAL PORT-A-CATH;  Surgeon: Irene Limbo, MD;  Location: [place];  Service: Plastics;  Laterality: Left;  . PORTACATH PLACEMENT N/A 09/16/2013   Procedure: INSERTION PORT-A-CATH;  Surgeon: Rolm Bookbinder, MD;  Location: [place];  Service: General;  Laterality: N/A;  . REMOVAL OF BILATERAL TISSUE EXPANDERS WITH PLACEMENT OF BILATERAL BREAST IMPLANTS Right 01/14/2014   Procedure: REMOVAL OF RIGHT TISSUE EXPANDERS WITH PLACEMENT OF PERMANENT BREAST IMPLANT;  Surgeon: Irene Limbo, MD;  Location: [place];  Service: Plastics;  Laterality: Right;  . TUBAL LIGATION    . VAGINAL HYSTERECTOMY      There were no vitals filed for this visit.      Subjective Assessment - 06-01-17 1413    Pain Descriptors / Indicators Dull   Pain Type Acute pain   Pain Onset More than a month ago                         [facility] Adult PT Treatment/Exercise - 06-01-17 0001      Shoulder Exercises: Pulleys   Flexion Limitations 5 minutes.   Other Pulley Exercises UE Ranger x 5 minutes.     Shoulder Exercises: ROM/Strengthening   UBE (Upper Arm Bike) 120 RPM's x 10  minutes.   Other ROM/Strengthening Exercises Yellow theraband IR/ER to fatigue.     Modalities   Modalities Chiropractor Stimulation Location Left shoulder.   Electrical Stimulation Action IFC   Electrical Stimulation Parameters 80-50 hz x 20 minutes.   Electrical Stimulation Goals Pain                  PT Short Term Goals - 03/21/17 1301      PT SHORT TERM GOAL #1   Title STG's=LTG's.           PT Long Term Goals - 06-01-17 1418      PT LONG TERM GOAL #1   Title Pt will be independent in advanced HEP.    Time 8   Period Weeks   Status Achieved     PT LONG TERM GOAL #2   Title Active left shoulder flexion to 150 degrees so the patient can easily reach overhead.   Time 8   Period Weeks   Status Achieved     PT LONG TERM GOAL #3   Title Increase shoulder strength to a solid 5/5 to increase stability for performance of functional activities.   Time 8   Period Weeks   Status Achieved     PT LONG TERM GOAL #4   Title Perform ADL's with left shoulder pain not > 2-3/10.   Time 8   Period Weeks   Status Achieved               Plan - June 01, 2017 1418    Clinical Impression Statement See discharge summary.      Patient will benefit from skilled therapeutic intervention in order to improve the following deficits and impairments:     Visit Diagnosis: Acute pain of left shoulder  Muscle weakness (generalized)       G-Codes - June 01, 2017 1354    Functional Assessment Tool Used (Outpatient Only) Clinical judgement....Marland Kitchen10th visit...discharge.   Functional Limitation Self care   Self Care Current Status [phone]) At least 1 percent but less than 20 percent impaired, limited or restricted   Self Care Goal Status ([id number]) At least 1 percent but less than 20 percent impaired, limited or restricted   Self Care Discharge Status [phone]) At least 1 percent but less than 20 percent impaired, limited or restricted       Problem List Patient Active Problem List   Diagnosis Date Noted  . Disorder of left rotator cuff 03/13/2017  . Essential hypertension 03/13/2017  . Reactive airway disease that is not asthma 09/21/2016  . History of breast cancer 09/21/2016  . Depression 05/13/2016  . Memory loss 05/13/2016  .  Gastroesophageal reflux disease without esophagitis 05/13/2016  . Hyperlipidemia 05/13/2016  . Body mass index 34.0-34.9, adult 05/13/2016  . Unspecified constipation 05/21/2014  . FH: colon cancer 05/21/2014  . Breast cancer of upper-outer quadrant of right female breast ([place]) 06/19/2013    Eddie Payette, [place] MPT 05/04/2017, 2:23 PM  [facility] [place], [place], [address] Phone: [phone]   Fax:  [phone]  Name: Morgan Santos MRN: [id number] Date of Birth: 1951-06-15  PHYSICAL THERAPY DISCHARGE SUMMARY  Visits from Start of Care: 10.  Current functional level related to goals / functional outcomes: See above.   Remaining deficits: All goals met.   Education / Equipment: HEP. Plan: Patient agrees to discharge.  Patient goals were met. Patient is being discharged due to meeting the stated rehab goals.  ?????        Mali Alaria Oconnor MPT

## 2017-06-01 ENCOUNTER — Encounter: Payer: Self-pay | Admitting: Orthopaedic Surgery

## 2017-06-01 ENCOUNTER — Other Ambulatory Visit: Payer: Self-pay | Admitting: Physician Assistant

## 2017-06-01 ENCOUNTER — Ambulatory Visit (INDEPENDENT_AMBULATORY_CARE_PROVIDER_SITE_OTHER): Payer: 59 | Admitting: Orthopaedic Surgery

## 2017-06-01 VITALS — BP 116/69 | HR 68 | Temp 97.9°F | Ht 69.25 in | Wt 238.0 lb

## 2017-06-01 DIAGNOSIS — M25512 Pain in left shoulder: Secondary | ICD-10-CM | POA: Diagnosis not present

## 2017-06-01 DIAGNOSIS — G8929 Other chronic pain: Secondary | ICD-10-CM | POA: Diagnosis not present

## 2017-06-01 DIAGNOSIS — E785 Hyperlipidemia, unspecified: Secondary | ICD-10-CM

## 2017-06-01 NOTE — Progress Notes (Signed)
Patient [name], female DOB:Jan 08, 1951, 66 y.o. BTD:[id number]  Chief Complaint  Patient presents with  . Shoulder Pain    left    HPI  Morgan Santos is a 66 y.o. female who has left shoulder pain.  She is much improved and only has occasional tenderness now of the left shoulder. She has no numbness.  She has no trauma.  She is doing well. HPI  Body mass index is 34.89 kg/m.  ROS  Review of Systems  HENT: Negative for congestion.   Respiratory: Negative for cough and shortness of breath.   Cardiovascular: Negative for chest pain and leg swelling.  Endocrine: Positive for cold intolerance.  Musculoskeletal: Positive for arthralgias and joint swelling.  Allergic/Immunologic: Positive for environmental allergies.  Psychiatric/Behavioral: The patient is nervous/anxious.     Past Medical History:  Diagnosis Date  . Anemia    when she was younger  . Anxiety   . Arthritis    hip- R, back; osteoarthritis  . Breast cancer ([place])   . Complication of anesthesia   . Constipation   . Depression    pt. remarks that she "takes respridal because if I don't take it I get angry"   . GERD (gastroesophageal reflux disease)    pt. uses vinegar for heartburn, ususally once per week   . H/O degenerative disc disease   . Hyperlipidemia   . Hypertension    "when I get upset"  Not on medication  . PONV (postoperative nausea and vomiting)    "just for one day"  . Stroke [facility]) ?2010 or 2011   2010, [facility] for stroke "mini stroke" short term memory problems since  . Wears dentures    top  . Wears glasses     Past Surgical History:  Procedure Laterality Date  . BREAST RECONSTRUCTION WITH PLACEMENT OF TISSUE EXPANDER AND FLEX HD (ACELLULAR HYDRATED DERMIS) Right 07/12/2013   Procedure: RIGHT BREAST RECONSTRUCTION WITH PLACEMENT OF TISSUE EXPANDER AND FLEX HD TO RIGHT BREAST (ACELLULAR HYDRATED DERMIS);  Surgeon: Irene Limbo, MD;  Location: [place];  Service: Plastics;   Laterality: Right;  . BREAST SURGERY     for abcesses- removed from both breasts, many yrs. ago  . CHOLECYSTECTOMY    . COLONOSCOPY   01/29/2004   NUR:A 7-8 mm polyp snared from the hepatic flexure.  Another 3 mm polyp was     cold snared from the sigmoid colon.External hemorrhoids, possible source of recent rectal bleeding  . COLONOSCOPY N/A 06/05/2014   Procedure: COLONOSCOPY;  Surgeon: Danie Binder, MD;  [facility];  Service: Endoscopy;  Laterality: N/A;  1030 - moved to 10:45 - Ginger to notify pt   . DILATION AND CURETTAGE OF UTERUS    . LUMBAR SPINE SURGERY    . MASTECTOMY    . MASTECTOMY W/ SENTINEL NODE BIOPSY Right 07/12/2013   Procedure: MASTECTOMY WITH SENTINEL LYMPH NODE BIOPSY;  Surgeon: Rolm Bookbinder, MD;  Location: [place];  Service: General;  Laterality: Right;  . MASTOPEXY Left 01/14/2014   Procedure: LEFT BREAST MASTOPEXY FOR SYMMETRY ;  Surgeon: Irene Limbo, MD;  Location: [place];  Service: Plastics;  Laterality: Left;  . PORT-A-CATH REMOVAL Left 01/14/2014   Procedure: REMOVAL PORT-A-CATH;  Surgeon: Irene Limbo, MD;  Location: [place];  Service: Plastics;  Laterality: Left;  . PORTACATH PLACEMENT N/A 09/16/2013   Procedure: INSERTION PORT-A-CATH;  Surgeon: Rolm Bookbinder, MD;  Location: [place];  Service: General;  Laterality: N/A;  . REMOVAL OF BILATERAL TISSUE EXPANDERS WITH PLACEMENT OF BILATERAL BREAST IMPLANTS Right 01/14/2014   Procedure: REMOVAL OF RIGHT TISSUE EXPANDERS WITH PLACEMENT OF PERMANENT BREAST IMPLANT;  Surgeon: Irene Limbo, MD;  Location: [place];  Service: Plastics;  Laterality: Right;  . TUBAL LIGATION    . VAGINAL HYSTERECTOMY      Family History  Problem Relation Age of Onset  . Hypertension Mother   . Diabetes type II Mother   . CVA Mother   . Heart disease Mother   . Osteoarthritis Mother   . Depression Mother   . Other Mother        degenerative disc  disease  . Alcoholism Father   . Pneumonia Father   . Colon cancer Maternal Aunt        age 25s  . Colon cancer Other        maternal great aunt    Social History Social History  Substance Use Topics  . Smoking status: Former Smoker    Quit date: 09/14/1995  . Smokeless tobacco: Never Used  . Alcohol use No    Allergies  Allergen Reactions  . Codeine Nausea And Vomiting  . Crestor [Rosuvastatin Calcium]   . Lipitor [Atorvastatin]   . Multihance [Gadobenate] Nausea Only and Cough    PT HAD INCREASED MUCOUS PRODUCTION AND PHLEGMY COUGH LASTING 10 MINS AFTER INJECTION, PT SENT HOME WITH BENADRYL, PREV NAUSEA AFTER GAD    Current Outpatient Prescriptions  Medication Sig Dispense Refill  . ALPRAZolam (XANAX) 0.5 MG tablet TAKE 1 TABLET BY MOUTH TWICE DAILY AS NEEDED 60 tablet 2  . anastrozole (ARIMIDEX) 1 MG tablet TAKE ONE TABLET BY MOUTH ONCE DAILY 90 tablet 3  . budesonide-formoterol (SYMBICORT) 160-4.5 MCG/ACT inhaler Inhale 1 puff into the lungs 2 (two) times daily. 1 Inhaler 3  . Calcium-Magnesium-Zinc 500-250-12.5 MG TABS Take by mouth 2 (two) times daily.    . Cholecalciferol (VITAMIN D-3) 5000 UNITS TABS Take 2 each by mouth daily.    . citalopram (CELEXA) 40 MG tablet Take 1 tablet (40 mg total) by mouth daily. 90 tablet 0  . donepezil (ARICEPT) 10 MG tablet Take 10 mg by mouth daily.    . Fluticasone-Salmeterol (ADVAIR DISKUS) 250-50 MCG/DOSE AEPB Inhale 1 puff into the lungs 2 (two) times daily. 1 each 3  . gabapentin (NEURONTIN) 300 MG capsule TAKE 1 CAPSULE BY MOUTH TWICE DAILY (MUST BE SEEN FOR REFILLS) 60 capsule 0  . HYDROcodone-acetaminophen (NORCO) 10-325 MG tablet Take 1-2 tablets by mouth every 6 (six) hours as needed. 40 tablet 0  . lisinopril (PRINIVIL,ZESTRIL) 10 MG tablet Take 1 tablet (10 mg total) by mouth daily. 90 tablet 0  . meloxicam (MOBIC) 7.5 MG tablet Take 1 tablet (7.5 mg total) by mouth daily. (Patient not taking: Reported on 03/21/2017) 30 tablet  5  . memantine (NAMENDA) 10 MG tablet Take 10 mg by mouth 2 (two) times daily.    . naproxen (NAPROSYN) 500 MG tablet Take 1 tablet (500 mg total) by mouth 2 (two) times daily with a meal. 60 tablet 5  . omeprazole (PRILOSEC) 20 MG capsule Take 20 mg by mouth daily.     . pravastatin (PRAVACHOL) 40 MG tablet TAKE ONE TABLET BY MOUTH ONCE DAILY 30 tablet 0  . predniSONE (STERAPRED UNI-PAK 48 TAB) 10 MG (48) TBPK tablet Take as directed for 12 days 48 tablet 0  . vitamin B-12 (CYANOCOBALAMIN) 100 MCG tablet Take 100 mcg by  mouth daily.     No current facility-administered medications for this visit.      Physical Exam  Blood pressure 116/69, pulse 68, temperature 97.9 F (36.6 C), height 5' 9.25" (1.759 m), weight 238 lb (108 kg).  Constitutional: overall normal hygiene, normal nutrition, well developed, normal grooming, normal body habitus. Assistive device:none  Musculoskeletal: gait and station Limp none, muscle tone and strength are normal, no tremors or atrophy is present.  .  Neurological: coordination overall normal.  Deep tendon reflex/nerve stretch intact.  Sensation normal.  Cranial nerves II-XII intact.   Skin:   Normal overall no scars, lesions, ulcers or rashes. No psoriasis.  Psychiatric: Alert and oriented x 3.  Recent memory intact, remote memory unclear.  Normal mood and affect. Well groomed.  Good eye contact.  Cardiovascular: overall no swelling, no varicosities, no edema bilaterally, normal temperatures of the legs and arms, no clubbing, cyanosis and good capillary refill.  Lymphatic: palpation is normal.  All other systems reviewed and are negative   Left shoulder has full ROM and no pain, exam normal.  The patient has been educated about the nature of the problem(s) and counseled on treatment options.  The patient appeared to understand what I have discussed and is in agreement with it.  Encounter Diagnosis  Name Primary?  . Chronic left shoulder pain Yes     PLAN Call if any problems.  Precautions discussed.  Continue current medications.   Return to clinic prn   Electronically Signed Sanjuana Kava, MD 10/11/20189:57 AM

## 2017-06-02 ENCOUNTER — Other Ambulatory Visit: Payer: Self-pay | Admitting: Physician Assistant

## 2017-06-02 DIAGNOSIS — Z1231 Encounter for screening mammogram for malignant neoplasm of breast: Secondary | ICD-10-CM

## 2017-06-22 ENCOUNTER — Ambulatory Visit
Admission: RE | Admit: 2017-06-22 | Discharge: 2017-06-22 | Disposition: A | Discharge status: Home / Self Care | Payer: Medicare Other | Source: Ambulatory Visit | Attending: Physician Assistant | Admitting: Physician Assistant

## 2017-06-22 DIAGNOSIS — Z1231 Encounter for screening mammogram for malignant neoplasm of breast: Secondary | ICD-10-CM

## 2017-06-23 ENCOUNTER — Other Ambulatory Visit: Payer: Self-pay | Admitting: Hematology and Oncology

## 2017-06-29 DIAGNOSIS — Z853 Personal history of malignant neoplasm of breast: Secondary | ICD-10-CM | POA: Diagnosis not present

## 2017-06-29 DIAGNOSIS — Z9011 Acquired absence of right breast and nipple: Secondary | ICD-10-CM | POA: Diagnosis not present

## 2017-06-30 ENCOUNTER — Other Ambulatory Visit: Payer: Self-pay | Admitting: Physician Assistant

## 2017-07-16 ENCOUNTER — Other Ambulatory Visit: Payer: Self-pay | Admitting: Physician Assistant

## 2017-07-20 ENCOUNTER — Other Ambulatory Visit: Payer: Self-pay | Admitting: Physician Assistant

## 2017-07-20 DIAGNOSIS — E785 Hyperlipidemia, unspecified: Secondary | ICD-10-CM

## 2017-07-20 DIAGNOSIS — F0390 Unspecified dementia without behavioral disturbance: Secondary | ICD-10-CM | POA: Diagnosis not present

## 2017-07-20 DIAGNOSIS — E538 Deficiency of other specified B group vitamins: Secondary | ICD-10-CM | POA: Diagnosis not present

## 2017-07-26 ENCOUNTER — Encounter: Payer: Self-pay | Admitting: Physician Assistant

## 2017-07-26 ENCOUNTER — Ambulatory Visit (INDEPENDENT_AMBULATORY_CARE_PROVIDER_SITE_OTHER): Payer: Commercial Indemnity | Admitting: Physician Assistant

## 2017-07-26 VITALS — BP 112/66 | HR 59 | Temp 98.4°F | Ht 69.25 in | Wt 241.8 lb

## 2017-07-26 DIAGNOSIS — K635 Polyp of colon: Secondary | ICD-10-CM

## 2017-07-26 DIAGNOSIS — M25511 Pain in right shoulder: Secondary | ICD-10-CM | POA: Diagnosis not present

## 2017-07-26 DIAGNOSIS — R5382 Chronic fatigue, unspecified: Secondary | ICD-10-CM | POA: Diagnosis not present

## 2017-07-26 DIAGNOSIS — F5101 Primary insomnia: Secondary | ICD-10-CM | POA: Insufficient documentation

## 2017-07-26 DIAGNOSIS — R6889 Other general symptoms and signs: Secondary | ICD-10-CM

## 2017-07-26 DIAGNOSIS — M25512 Pain in left shoulder: Secondary | ICD-10-CM

## 2017-07-26 NOTE — Progress Notes (Signed)
BP 112/66   Pulse (!) 59   Temp 98.4 F (36.9 C) (Oral)   Ht 5' 9.25" (1.759 m)   Wt 241 lb 12.8 oz (109.7 kg)   BMI 35.45 kg/m    Subjective:    Patient ID: Morgan Santos, female    DOB: [birth year], 66 y.o.   MRN: [id number]  HPI: Morgan Santos is a 66 y.o. female presenting on 07/26/2017 for Chills; Headache; achy joints; and Referral (colonoscopy)  Patient comes in with complaint of excessive fatigue for many weeks, she is having a lot of insomnia.  She was seen by her neurologist Dr. Lavone Neri.  He gave her trazodone 50 mg to try at bedtime.  She had not done this yet.  I have encouraged her to try it.  Can have her come back in 4 weeks to recheck on this.  She also states that she has "a drop in her feelings".  Her brother died earlier in the year and she has just had a significant problem with grief and depression because of it.  She also complains of severe chills at times, joint aches, headache.  She states she also needs a gastroenterology evaluation.  It has been a few years since she had her colonoscopy.  She did have polyps on previous ones.  We will send a referral for this. Depression screen [facility] 2/9 07/26/2017 03/13/2017 09/21/2016 06/07/2016 05/13/2016  Decreased Interest 2 1 1 2 1   Down, Depressed, Hopeless 2 0 3 2 1   PHQ - 2 Score 4 1 4 4 2   Altered sleeping 3 - 3 3 3   Tired, decreased energy 1 - 1 3 2   Change in appetite 0 - 0 3 2  Feeling bad or failure about yourself  2 - 3 3 1   Trouble concentrating 2 - 3 2 2   Moving slowly or fidgety/restless 2 - 1 1 1   Suicidal thoughts 1 - 2 1 1   PHQ-9 Score 15 - 17 20 14   Difficult doing work/chores - - - - Somewhat difficult    Relevant past medical, surgical, family and social history reviewed and updated as indicated. Allergies and medications reviewed and updated.  Past Medical History:  Diagnosis Date  . Anemia    when she was younger  . Anxiety   . Arthritis    hip- R, back; osteoarthritis  . Breast cancer ([place])   .  Complication of anesthesia   . Constipation   . Depression    pt. remarks that she "takes respridal because if I don't take it I get angry"   . GERD (gastroesophageal reflux disease)    pt. uses vinegar for heartburn, ususally once per week   . H/O degenerative disc disease   . Hyperlipidemia   . Hypertension    "when I get upset"  Not on medication  . PONV (postoperative nausea and vomiting)    "just for one day"  . Stroke [facility]) ?2010 or 2011   2010, [facility] for stroke "mini stroke" short term memory problems since  . Wears dentures    top  . Wears glasses     Past Surgical History:  Procedure Laterality Date  . BREAST RECONSTRUCTION WITH PLACEMENT OF TISSUE EXPANDER AND FLEX HD (ACELLULAR HYDRATED DERMIS) Right 07/12/2013   Procedure: RIGHT BREAST RECONSTRUCTION WITH PLACEMENT OF TISSUE EXPANDER AND FLEX HD TO RIGHT BREAST (ACELLULAR HYDRATED DERMIS);  Surgeon: Irene Limbo, MD;  Location: [place];  Service: Plastics;  Laterality: Right;  .  BREAST SURGERY     for abcesses- removed from both breasts, many yrs. ago  . CHOLECYSTECTOMY    . COLONOSCOPY   01/29/2004   NUR:A 7-8 mm polyp snared from the hepatic flexure.  Another 3 mm polyp was     cold snared from the sigmoid colon.External hemorrhoids, possible source of recent rectal bleeding  . COLONOSCOPY N/A 06/05/2014   Procedure: COLONOSCOPY;  Surgeon: Danie Binder, MD;  [facility];  Service: Endoscopy;  Laterality: N/A;  1030 - moved to 10:45 - Ginger to notify pt   . DILATION AND CURETTAGE OF UTERUS    . LUMBAR SPINE SURGERY    . MASTECTOMY Right   . MASTECTOMY W/ SENTINEL NODE BIOPSY Right 07/12/2013   Procedure: MASTECTOMY WITH SENTINEL LYMPH NODE BIOPSY;  Surgeon: Rolm Bookbinder, MD;  Location: [place];  Service: General;  Laterality: Right;  . MASTOPEXY Left 01/14/2014   Procedure: LEFT BREAST MASTOPEXY FOR SYMMETRY ;  Surgeon: Irene Limbo, MD;  Location: [place];  Service:  Plastics;  Laterality: Left;  . PORT-A-CATH REMOVAL Left 01/14/2014   Procedure: REMOVAL PORT-A-CATH;  Surgeon: Irene Limbo, MD;  Location: [place];  Service: Plastics;  Laterality: Left;  . PORTACATH PLACEMENT N/A 09/16/2013   Procedure: INSERTION PORT-A-CATH;  Surgeon: Rolm Bookbinder, MD;  Location: [place];  Service: General;  Laterality: N/A;  . REDUCTION MAMMAPLASTY Left   . REMOVAL OF BILATERAL TISSUE EXPANDERS WITH PLACEMENT OF BILATERAL BREAST IMPLANTS Right 01/14/2014   Procedure: REMOVAL OF RIGHT TISSUE EXPANDERS WITH PLACEMENT OF PERMANENT BREAST IMPLANT;  Surgeon: Irene Limbo, MD;  Location: [place];  Service: Plastics;  Laterality: Right;  . TUBAL LIGATION    . VAGINAL HYSTERECTOMY      Review of Systems  Constitutional: Positive for chills and fatigue. Negative for activity change, fever and unexpected weight change.  HENT: Negative.   Eyes: Negative.   Respiratory: Negative.  Negative for cough.   Cardiovascular: Negative.  Negative for chest pain, palpitations and leg swelling.  Gastrointestinal: Negative.  Negative for abdominal pain.  Endocrine: Negative.   Genitourinary: Negative.  Negative for dysuria.  Musculoskeletal: Positive for arthralgias.  Skin: Negative.   Neurological: Negative.   Psychiatric/Behavioral: Positive for decreased concentration, dysphoric mood and sleep disturbance. Negative for hallucinations and suicidal ideas.    Allergies as of 07/26/2017      Reactions   Codeine Nausea And Vomiting   Crestor [rosuvastatin Calcium]    Lipitor [atorvastatin]    Multihance [gadobenate] Nausea Only, Cough   PT HAD INCREASED MUCOUS PRODUCTION AND PHLEGMY COUGH LASTING 10 MINS AFTER INJECTION, PT SENT HOME WITH BENADRYL, PREV NAUSEA AFTER GAD      Medication List        Accurate as of 07/26/17  3:34 PM. Always use your most recent med list.          ALPRAZolam 0.5 MG tablet Commonly known as:  XANAX TAKE 1  TABLET BY MOUTH TWICE DAILY AS NEEDED   anastrozole 1 MG tablet Commonly known as:  ARIMIDEX TAKE ONE TABLET BY MOUTH ONCE DAILY   budesonide-formoterol 160-4.5 MCG/ACT inhaler Commonly known as:  SYMBICORT Inhale 1 puff into the lungs 2 (two) times daily.   Calcium-Magnesium-Zinc 500-250-12.5 MG Tabs Take by mouth 2 (two) times daily.   citalopram 40 MG tablet Commonly known as:  CELEXA TAKE 1 TABLET BY MOUTH ONCE DAILY   donepezil 10 MG tablet Commonly known as:  ARICEPT Take  10 mg by mouth daily.   Fluticasone-Salmeterol 250-50 MCG/DOSE Aepb Commonly known as:  ADVAIR DISKUS Inhale 1 puff into the lungs 2 (two) times daily.   gabapentin 300 MG capsule Commonly known as:  NEURONTIN TAKE 1 CAPSULE BY MOUTH TWICE DAILY   HYDROcodone-acetaminophen 10-325 MG tablet Commonly known as:  NORCO Take 1-2 tablets by mouth every 6 (six) hours as needed.   lisinopril 10 MG tablet Commonly known as:  PRINIVIL,ZESTRIL Take 1 tablet (10 mg total) by mouth daily.   meloxicam 7.5 MG tablet Commonly known as:  MOBIC TAKE 1 TABLET BY MOUTH ONCE DAILY   memantine 10 MG tablet Commonly known as:  NAMENDA Take 10 mg by mouth 2 (two) times daily.   omeprazole 20 MG capsule Commonly known as:  PRILOSEC Take 20 mg by mouth daily.   pravastatin 40 MG tablet Commonly known as:  PRAVACHOL TAKE 1 TABLET BY MOUTH ONCE DAILY   traZODone 50 MG tablet Commonly known as:  DESYREL Take 50 mg by mouth at bedtime.   Vitamin D-3 5000 units Tabs Take 2 each by mouth daily.          Objective:    BP 112/66   Pulse (!) 59   Temp 98.4 F (36.9 C) (Oral)   Ht 5' 9.25" (1.759 m)   Wt 241 lb 12.8 oz (109.7 kg)   BMI 35.45 kg/m   Allergies  Allergen Reactions  . Codeine Nausea And Vomiting  . Crestor [Rosuvastatin Calcium]   . Lipitor [Atorvastatin]   . Multihance [Gadobenate] Nausea Only and Cough    PT HAD INCREASED MUCOUS PRODUCTION AND PHLEGMY COUGH LASTING 10 MINS AFTER  INJECTION, PT SENT HOME WITH BENADRYL, PREV NAUSEA AFTER GAD    Physical Exam  Constitutional: She is oriented to person, place, and time. She appears well-developed and well-nourished.  HENT:  Head: Normocephalic and atraumatic.  Right Ear: Tympanic membrane, external ear and ear canal normal.  Left Ear: Tympanic membrane, external ear and ear canal normal.  Nose: Nose normal. No rhinorrhea.  Mouth/Throat: Oropharynx is clear and moist and mucous membranes are normal. No oropharyngeal exudate or posterior oropharyngeal erythema.  Eyes: Conjunctivae and EOM are normal. Pupils are equal, round, and reactive to light.  Neck: Normal range of motion. Neck supple.  Cardiovascular: Normal rate, regular rhythm, normal heart sounds and intact distal pulses.  Pulmonary/Chest: Effort normal and breath sounds normal.  Abdominal: Soft. Bowel sounds are normal.  Neurological: She is alert and oriented to person, place, and time. She has normal reflexes.  Skin: Skin is warm and dry. No rash noted.  Psychiatric: Judgment and thought content normal. She is slowed. Cognition and memory are not impaired. She exhibits a depressed mood.    Results for orders placed or performed in visit on 09/21/16  CBC with Differential/Platelet  Result Value Ref Range   WBC 4.7 3.4 - 10.8 x10E3/uL   RBC 4.67 3.77 - 5.28 x10E6/uL   Hemoglobin 13.7 11.1 - 15.9 g/dL   Hematocrit 42.3 34.0 - 46.6 %   MCV 91 79 - 97 fL   MCH 29.3 26.6 - 33.0 pg   MCHC 32.4 31.5 - 35.7 g/dL   RDW 13.4 12.3 - 15.4 %   Platelets 241 150 - 379 x10E3/uL   Neutrophils 45 Not Estab. %   Lymphs 45 Not Estab. %   Monocytes 7 Not Estab. %   Eos 2 Not Estab. %   Basos 1 Not Estab. %  Neutrophils Absolute 2.1 1.4 - 7.0 x10E3/uL   Lymphocytes Absolute 2.1 0.7 - 3.1 x10E3/uL   Monocytes Absolute 0.3 0.1 - 0.9 x10E3/uL   EOS (ABSOLUTE) 0.1 0.0 - 0.4 x10E3/uL   Basophils Absolute 0.0 0.0 - 0.2 x10E3/uL   Immature Granulocytes 0 Not Estab. %    Immature Grans (Abs) 0.0 0.0 - 0.1 x10E3/uL  CMP14+EGFR  Result Value Ref Range   Glucose 97 65 - 99 mg/dL   BUN 17 8 - 27 mg/dL   Creatinine, Ser 0.73 0.57 - 1.00 mg/dL   GFR calc non Af Amer 87 >59 mL/min/1.73   GFR calc Af Amer 100 >59 mL/min/1.73   BUN/Creatinine Ratio 23 12 - 28   Sodium 141 134 - 144 mmol/L   Potassium 4.2 3.5 - 5.2 mmol/L   Chloride 101 96 - 106 mmol/L   CO2 26 18 - 29 mmol/L   Calcium 9.2 8.7 - 10.3 mg/dL   Total Protein 6.7 6.0 - 8.5 g/dL   Albumin 4.2 3.6 - 4.8 g/dL   Globulin, Total 2.5 1.5 - 4.5 g/dL   Albumin/Globulin Ratio 1.7 1.2 - 2.2   Bilirubin Total 0.7 0.0 - 1.2 mg/dL   Alkaline Phosphatase 145 (H) 39 - 117 IU/L   AST 18 0 - 40 IU/L   ALT 16 0 - 32 IU/L  Lipid panel  Result Value Ref Range   Cholesterol, Total 228 (H) 100 - 199 mg/dL   Triglycerides 115 0 - 149 mg/dL   HDL 49 >39 mg/dL   VLDL Cholesterol Cal 23 5 - 40 mg/dL   LDL Calculated 156 (H) 0 - 99 mg/dL   Chol/HDL Ratio 4.7 (H) 0.0 - 4.4 ratio units  Thyroid Panel With TSH  Result Value Ref Range   TSH 1.750 0.450 - 4.500 uIU/mL   T4, Total 6.3 4.5 - 12.0 ug/dL   T3 Uptake Ratio 25 24 - 39 %   Free Thyroxine Index 1.6 1.2 - 4.9      Assessment & Plan:   1. Colorectal polyp detected on colonoscopy - Ambulatory referral to Gastroenterology  2. Primary insomnia - traZODone (DESYREL) 50 MG tablet; Take 50 mg by mouth at bedtime.  3. Acute pain of both shoulders  4. Chronic fatigue - CBC with Differential/Platelet - TSH - CMP14+EGFR  5. Cold intolerance - CBC with Differential/Platelet - TSH - CMP14+EGFR    Current Outpatient Medications:  .  ALPRAZolam (XANAX) 0.5 MG tablet, TAKE 1 TABLET BY MOUTH TWICE DAILY AS NEEDED, Disp: 60 tablet, Rfl: 2 .  anastrozole (ARIMIDEX) 1 MG tablet, TAKE ONE TABLET BY MOUTH ONCE DAILY, Disp: 30 tablet, Rfl: 11 .  budesonide-formoterol (SYMBICORT) 160-4.5 MCG/ACT inhaler, Inhale 1 puff into the lungs 2 (two) times daily., Disp: 1  Inhaler, Rfl: 3 .  Calcium-Magnesium-Zinc 500-250-12.5 MG TABS, Take by mouth 2 (two) times daily., Disp: , Rfl:  .  Cholecalciferol (VITAMIN D-3) 5000 UNITS TABS, Take 2 each by mouth daily., Disp: , Rfl:  .  citalopram (CELEXA) 40 MG tablet, TAKE 1 TABLET BY MOUTH ONCE DAILY, Disp: 90 tablet, Rfl: 0 .  donepezil (ARICEPT) 10 MG tablet, Take 10 mg by mouth daily., Disp: , Rfl:  .  Fluticasone-Salmeterol (ADVAIR DISKUS) 250-50 MCG/DOSE AEPB, Inhale 1 puff into the lungs 2 (two) times daily., Disp: 1 each, Rfl: 3 .  gabapentin (NEURONTIN) 300 MG capsule, TAKE 1 CAPSULE BY MOUTH TWICE DAILY, Disp: 60 capsule, Rfl: 0 .  HYDROcodone-acetaminophen (NORCO) 10-325 MG tablet, Take 1-2  tablets by mouth every 6 (six) hours as needed., Disp: 40 tablet, Rfl: 0 .  lisinopril (PRINIVIL,ZESTRIL) 10 MG tablet, Take 1 tablet (10 mg total) by mouth daily., Disp: 90 tablet, Rfl: 0 .  meloxicam (MOBIC) 7.5 MG tablet, TAKE 1 TABLET BY MOUTH ONCE DAILY, Disp: 30 tablet, Rfl: 5 .  memantine (NAMENDA) 10 MG tablet, Take 10 mg by mouth 2 (two) times daily., Disp: , Rfl:  .  omeprazole (PRILOSEC) 20 MG capsule, Take 20 mg by mouth daily. , Disp: , Rfl:  .  pravastatin (PRAVACHOL) 40 MG tablet, TAKE 1 TABLET BY MOUTH ONCE DAILY, Disp: 30 tablet, Rfl: 11 .  traZODone (DESYREL) 50 MG tablet, Take 50 mg by mouth at bedtime., Disp: , Rfl:  Continue all other maintenance medications as listed above.  Follow up plan: Return in about 4 weeks (around 08/23/2017) for recheck.  Educational handout given for Lane PA-C [place] [address]  [place], [place] [address] [phone]   07/26/2017, 3:34 PM

## 2017-07-26 NOTE — Patient Instructions (Signed)

## 2017-07-27 LAB — CMP14+EGFR
ALK PHOS: 129 IU/L — AB (ref 39–117)
ALT: 17 IU/L (ref 0–32)
AST: 20 IU/L (ref 0–40)
Albumin/Globulin Ratio: 1.5 (ref 1.2–2.2)
Albumin: 4 g/dL (ref 3.6–4.8)
BUN / CREAT RATIO: 13 (ref 12–28)
BUN: 13 mg/dL (ref 8–27)
Bilirubin Total: 0.4 mg/dL (ref 0.0–1.2)
CHLORIDE: 105 mmol/L (ref 96–106)
CO2: 28 mmol/L (ref 20–29)
CREATININE: 0.97 mg/dL (ref 0.57–1.00)
Calcium: 9.4 mg/dL (ref 8.7–10.3)
GFR, EST AFRICAN AMERICAN: 70 mL/min/{1.73_m2} (ref >59)
GFR, EST NON AFRICAN AMERICAN: 61 mL/min/{1.73_m2} (ref >59)
GLOBULIN, TOTAL: 2.7 g/dL (ref 1.5–4.5)
GLUCOSE: 84 mg/dL (ref 65–99)
POTASSIUM: 4.3 mmol/L (ref 3.5–5.2)
SODIUM: 145 mmol/L — AB (ref 134–144)
Total Protein: 6.7 g/dL (ref 6.0–8.5)

## 2017-07-27 LAB — CBC WITH DIFFERENTIAL/PLATELET
BASOS: 1 %
Basophils Absolute: 0 10*3/uL (ref 0.0–0.2)
EOS (ABSOLUTE): 0.2 10*3/uL (ref 0.0–0.4)
Eos: 3 %
HEMATOCRIT: 40.5 % (ref 34.0–46.6)
Hemoglobin: 13 g/dL (ref 11.1–15.9)
IMMATURE GRANULOCYTES: 0 %
Immature Grans (Abs): 0 10*3/uL (ref 0.0–0.1)
LYMPHS: 43 %
Lymphocytes Absolute: 2.6 10*3/uL (ref 0.7–3.1)
MCH: 29.4 pg (ref 26.6–33.0)
MCHC: 32.1 g/dL (ref 31.5–35.7)
MCV: 92 fL (ref 79–97)
Monocytes Absolute: 0.5 10*3/uL (ref 0.1–0.9)
Monocytes: 9 %
NEUTROS PCT: 44 %
Neutrophils Absolute: 2.6 10*3/uL (ref 1.4–7.0)
PLATELETS: 237 10*3/uL (ref 150–379)
RBC: 4.42 x10E6/uL (ref 3.77–5.28)
RDW: 12.9 % (ref 12.3–15.4)
WBC: 5.9 10*3/uL (ref 3.4–10.8)

## 2017-07-27 LAB — TSH: TSH: 1.32 u[IU]/mL (ref 0.450–4.500)

## 2017-08-10 ENCOUNTER — Other Ambulatory Visit: Payer: Self-pay | Admitting: Family Medicine

## 2017-08-10 NOTE — Telephone Encounter (Signed)
Phoned in.

## 2017-08-10 NOTE — Telephone Encounter (Signed)
Last seen 07/26/17  Morgan Santos  If approved route to nurse to call into [facility]

## 2017-08-24 ENCOUNTER — Encounter: Payer: Self-pay | Admitting: Physician Assistant

## 2017-08-24 ENCOUNTER — Ambulatory Visit (INDEPENDENT_AMBULATORY_CARE_PROVIDER_SITE_OTHER): Payer: PRIVATE HEALTH INSURANCE | Admitting: Physician Assistant

## 2017-08-24 VITALS — BP 119/71 | HR 73 | Temp 98.5°F | Ht 69.25 in | Wt 240.8 lb

## 2017-08-24 DIAGNOSIS — J40 Bronchitis, not specified as acute or chronic: Secondary | ICD-10-CM | POA: Diagnosis not present

## 2017-08-24 DIAGNOSIS — F331 Major depressive disorder, recurrent, moderate: Secondary | ICD-10-CM | POA: Diagnosis not present

## 2017-08-24 DIAGNOSIS — R0989 Other specified symptoms and signs involving the circulatory and respiratory systems: Secondary | ICD-10-CM

## 2017-08-24 DIAGNOSIS — J989 Respiratory disorder, unspecified: Secondary | ICD-10-CM

## 2017-08-24 MED ORDER — BUDESONIDE-FORMOTEROL FUMARATE 160-4.5 MCG/ACT IN AERO: 1.0000 | INHALATION_SPRAY | Freq: Two times a day (BID) | RESPIRATORY_TRACT | 3 refills | Status: DC

## 2017-08-24 MED ORDER — PREDNISONE 10 MG (21) PO TBPK: ORAL_TABLET | ORAL | 0 refills | Status: DC

## 2017-08-24 MED ORDER — CEFDINIR 300 MG PO CAPS: 300.0000 mg | ORAL_CAPSULE | Freq: Two times a day (BID) | ORAL | 0 refills | Status: DC

## 2017-08-24 MED ORDER — CITALOPRAM HYDROBROMIDE 40 MG PO TABS: 40.0000 mg | ORAL_TABLET | Freq: Every day | ORAL | 3 refills | Status: DC

## 2017-08-24 NOTE — Patient Instructions (Signed)
In a few days you may receive a survey in the mail or online from [employer] regarding your visit with us today. Please take a moment to fill this out. Your feedback is very important to our whole office. It can help us better understand your needs as well as improve your experience and satisfaction. Thank you for taking your time to complete it. We care about you.  Felicita Nuncio, PA-C  

## 2017-08-24 NOTE — Progress Notes (Signed)
BP 119/71   Pulse 73   Temp 98.5 F (36.9 C) (Oral)   Ht 5' 9.25" (1.759 m)   Wt 240 lb 12.8 oz (109.2 kg)   BMI 35.30 kg/m    Subjective:    Patient ID: Morgan Santos, female    DOB: 20-Apr-1951, 67 y.o.   MRN: [id number]  HPI: Morgan Santos is a 67 y.o. female presenting on 08/24/2017 for Follow-up (4 week rck ); Emesis; Chills; and Cough  Patient with several days of progressing upper respiratory and bronchial symptoms. Initially there was more upper respiratory congestion. This progressed to having significant cough that is productive throughout the day and severe at night. There is occasional wheezing after coughing. Sometimes there is slight dyspnea on exertion. It is productive mucus that is yellow in color. Denies any blood.  Also recheck on antidepressant, which is significantly better.  Overall much improved in that arena. Depression screen [facility] 2/9 08/24/2017 07/26/2017 03/13/2017 09/21/2016 06/07/2016  Decreased Interest 1 2 1 1 2   Down, Depressed, Hopeless 1 2 0 3 2  PHQ - 2 Score 2 4 1 4 4   Altered sleeping 0 3 - 3 3  Tired, decreased energy 0 1 - 1 3  Change in appetite 2 0 - 0 3  Feeling bad or failure about yourself  0 2 - 3 3  Trouble concentrating 0 2 - 3 2  Moving slowly or fidgety/restless 3 2 - 1 1  Suicidal thoughts 0 1 - 2 1  PHQ-9 Score 7 15 - 17 20  Difficult doing work/chores - - - - -     Relevant past medical, surgical, family and social history reviewed and updated as indicated. Allergies and medications reviewed and updated.  Past Medical History:  Diagnosis Date  . Anemia    when she was younger  . Anxiety   . Arthritis    hip- R, back; osteoarthritis  . Breast cancer ([place])   . Complication of anesthesia   . Constipation   . Depression    pt. remarks that she "takes respridal because if I don't take it I get angry"   . GERD (gastroesophageal reflux disease)    pt. uses vinegar for heartburn, ususally once per week   . H/O degenerative disc  disease   . Hyperlipidemia   . Hypertension    "when I get upset"  Not on medication  . PONV (postoperative nausea and vomiting)    "just for one day"  . Stroke [facility]) ?2010 or 2011   2010, [facility] for stroke "mini stroke" short term memory problems since  . Wears dentures    top  . Wears glasses     Past Surgical History:  Procedure Laterality Date  . BREAST RECONSTRUCTION WITH PLACEMENT OF TISSUE EXPANDER AND FLEX HD (ACELLULAR HYDRATED DERMIS) Right 07/12/2013   Procedure: RIGHT BREAST RECONSTRUCTION WITH PLACEMENT OF TISSUE EXPANDER AND FLEX HD TO RIGHT BREAST (ACELLULAR HYDRATED DERMIS);  Surgeon: Irene Limbo, MD;  Location: [place];  Service: Plastics;  Laterality: Right;  . BREAST SURGERY     for abcesses- removed from both breasts, many yrs. ago  . CHOLECYSTECTOMY    . COLONOSCOPY   01/29/2004   NUR:A 7-8 mm polyp snared from the hepatic flexure.  Another 3 mm polyp was     cold snared from the sigmoid colon.External hemorrhoids, possible source of recent rectal bleeding  . COLONOSCOPY N/A 06/05/2014   Procedure: COLONOSCOPY;  Surgeon: Marga Melnick  Fields, MD;  [facility];  Service: Endoscopy;  Laterality: N/A;  1030 - moved to 10:45 - Ginger to notify pt   . DILATION AND CURETTAGE OF UTERUS    . LUMBAR SPINE SURGERY    . MASTECTOMY Right   . MASTECTOMY W/ SENTINEL NODE BIOPSY Right 07/12/2013   Procedure: MASTECTOMY WITH SENTINEL LYMPH NODE BIOPSY;  Surgeon: Rolm Bookbinder, MD;  Location: [place];  Service: General;  Laterality: Right;  . MASTOPEXY Left 01/14/2014   Procedure: LEFT BREAST MASTOPEXY FOR SYMMETRY ;  Surgeon: Irene Limbo, MD;  Location: [place];  Service: Plastics;  Laterality: Left;  . PORT-A-CATH REMOVAL Left 01/14/2014   Procedure: REMOVAL PORT-A-CATH;  Surgeon: Irene Limbo, MD;  Location: [place];  Service: Plastics;  Laterality: Left;  . PORTACATH PLACEMENT N/A 09/16/2013   Procedure:  INSERTION PORT-A-CATH;  Surgeon: Rolm Bookbinder, MD;  Location: [place];  Service: General;  Laterality: N/A;  . REDUCTION MAMMAPLASTY Left   . REMOVAL OF BILATERAL TISSUE EXPANDERS WITH PLACEMENT OF BILATERAL BREAST IMPLANTS Right 01/14/2014   Procedure: REMOVAL OF RIGHT TISSUE EXPANDERS WITH PLACEMENT OF PERMANENT BREAST IMPLANT;  Surgeon: Irene Limbo, MD;  Location: [place];  Service: Plastics;  Laterality: Right;  . TUBAL LIGATION    . VAGINAL HYSTERECTOMY      Review of Systems  Constitutional: Positive for chills, fatigue and fever. Negative for activity change and appetite change.  HENT: Positive for congestion, postnasal drip, sinus pressure and sore throat.   Eyes: Negative.   Respiratory: Positive for cough and wheezing. Negative for shortness of breath.   Cardiovascular: Negative.  Negative for chest pain, palpitations and leg swelling.  Gastrointestinal: Negative.   Genitourinary: Negative.   Musculoskeletal: Negative.   Skin: Negative.   Neurological: Positive for headaches.    Allergies as of 08/24/2017      Reactions   Codeine Nausea And Vomiting   Crestor [rosuvastatin Calcium]    Lipitor [atorvastatin]    Multihance [gadobenate] Nausea Only, Cough   PT HAD INCREASED MUCOUS PRODUCTION AND PHLEGMY COUGH LASTING 10 MINS AFTER INJECTION, PT SENT HOME WITH BENADRYL, PREV NAUSEA AFTER GAD      Medication List        Accurate as of 08/24/17  3:49 PM. Always use your most recent med list.          ALPRAZolam 0.5 MG tablet Commonly known as:  XANAX TAKE 1 TABLET BY MOUTH TWICE DAILY AS NEEDED   anastrozole 1 MG tablet Commonly known as:  ARIMIDEX TAKE ONE TABLET BY MOUTH ONCE DAILY   budesonide-formoterol 160-4.5 MCG/ACT inhaler Commonly known as:  SYMBICORT Inhale 1 puff into the lungs 2 (two) times daily.   Calcium-Magnesium-Zinc 500-250-12.5 MG Tabs Take by mouth 2 (two) times daily.   cefdinir 300 MG capsule Commonly known as:   OMNICEF Take 1 capsule (300 mg total) by mouth 2 (two) times daily. 1 po BID   citalopram 40 MG tablet Commonly known as:  CELEXA Take 1 tablet (40 mg total) by mouth daily.   donepezil 10 MG tablet Commonly known as:  ARICEPT Take 10 mg by mouth daily.   gabapentin 300 MG capsule Commonly known as:  NEURONTIN TAKE 1 CAPSULE BY MOUTH TWICE DAILY   lisinopril 10 MG tablet Commonly known as:  PRINIVIL,ZESTRIL Take 1 tablet (10 mg total) by mouth daily.   meloxicam 7.5 MG tablet Commonly known as:  MOBIC TAKE 1 TABLET BY MOUTH ONCE  DAILY   memantine 10 MG tablet Commonly known as:  NAMENDA Take 10 mg by mouth 2 (two) times daily.   omeprazole 20 MG capsule Commonly known as:  PRILOSEC Take 20 mg by mouth daily.   pravastatin 40 MG tablet Commonly known as:  PRAVACHOL TAKE 1 TABLET BY MOUTH ONCE DAILY   predniSONE 10 MG (21) Tbpk tablet Commonly known as:  STERAPRED UNI-PAK 21 TAB As directed x 6 days   traZODone 50 MG tablet Commonly known as:  DESYREL Take 50 mg by mouth at bedtime.   Vitamin D-3 5000 units Tabs Take 2 each by mouth daily.          Objective:    BP 119/71   Pulse 73   Temp 98.5 F (36.9 C) (Oral)   Ht 5' 9.25" (1.759 m)   Wt 240 lb 12.8 oz (109.2 kg)   BMI 35.30 kg/m   Allergies  Allergen Reactions  . Codeine Nausea And Vomiting  . Crestor [Rosuvastatin Calcium]   . Lipitor [Atorvastatin]   . Multihance [Gadobenate] Nausea Only and Cough    PT HAD INCREASED MUCOUS PRODUCTION AND PHLEGMY COUGH LASTING 10 MINS AFTER INJECTION, PT SENT HOME WITH BENADRYL, PREV NAUSEA AFTER GAD    Physical Exam  Constitutional: She is oriented to person, place, and time. She appears well-developed and well-nourished.  HENT:  Head: Normocephalic and atraumatic.  Right Ear: Tympanic membrane and external ear normal. No middle ear effusion.  Left Ear: Tympanic membrane and external ear normal.  No middle ear effusion.  Nose: Mucosal edema and  rhinorrhea present. Right sinus exhibits no maxillary sinus tenderness. Left sinus exhibits no maxillary sinus tenderness.  Mouth/Throat: Uvula is midline. Posterior oropharyngeal erythema present.  Eyes: Conjunctivae and EOM are normal. Pupils are equal, round, and reactive to light. Right eye exhibits no discharge. Left eye exhibits no discharge.  Neck: Normal range of motion.  Cardiovascular: Normal rate, regular rhythm and normal heart sounds.  Pulmonary/Chest: Effort normal and breath sounds normal. No respiratory distress. She has no wheezes.  Abdominal: Soft.  Lymphadenopathy:    She has no cervical adenopathy.  Neurological: She is alert and oriented to person, place, and time.  Skin: Skin is warm and dry.  Psychiatric: She has a normal mood and affect.        Assessment & Plan:   1. Reactive airway disease that is not asthma - budesonide-formoterol (SYMBICORT) 160-4.5 MCG/ACT inhaler; Inhale 1 puff into the lungs 2 (two) times daily.  Dispense: 1 Inhaler; Refill: 3  2. Bronchitis - cefdinir (OMNICEF) 300 MG capsule; Take 1 capsule (300 mg total) by mouth 2 (two) times daily. 1 po BID  Dispense: 20 capsule; Refill: 0 - predniSONE (STERAPRED UNI-PAK 21 TAB) 10 MG (21) TBPK tablet; As directed x 6 days  Dispense: 21 tablet; Refill: 0  3. Moderate episode of recurrent major depressive disorder (HCC) - citalopram (CELEXA) 40 MG tablet; Take 1 tablet (40 mg total) by mouth daily.  Dispense: 90 tablet; Refill: 3   Current Outpatient Medications:  .  ALPRAZolam (XANAX) 0.5 MG tablet, TAKE 1 TABLET BY MOUTH TWICE DAILY AS NEEDED, Disp: 60 tablet, Rfl: 2 .  anastrozole (ARIMIDEX) 1 MG tablet, TAKE ONE TABLET BY MOUTH ONCE DAILY, Disp: 30 tablet, Rfl: 11 .  budesonide-formoterol (SYMBICORT) 160-4.5 MCG/ACT inhaler, Inhale 1 puff into the lungs 2 (two) times daily., Disp: 1 Inhaler, Rfl: 3 .  Calcium-Magnesium-Zinc 500-250-12.5 MG TABS, Take by mouth 2 (two) times daily.,  Disp: , Rfl:    .  Cholecalciferol (VITAMIN D-3) 5000 UNITS TABS, Take 2 each by mouth daily., Disp: , Rfl:  .  citalopram (CELEXA) 40 MG tablet, Take 1 tablet (40 mg total) by mouth daily., Disp: 90 tablet, Rfl: 3 .  donepezil (ARICEPT) 10 MG tablet, Take 10 mg by mouth daily., Disp: , Rfl:  .  gabapentin (NEURONTIN) 300 MG capsule, TAKE 1 CAPSULE BY MOUTH TWICE DAILY, Disp: 60 capsule, Rfl: 0 .  lisinopril (PRINIVIL,ZESTRIL) 10 MG tablet, Take 1 tablet (10 mg total) by mouth daily., Disp: 90 tablet, Rfl: 0 .  meloxicam (MOBIC) 7.5 MG tablet, TAKE 1 TABLET BY MOUTH ONCE DAILY, Disp: 30 tablet, Rfl: 5 .  memantine (NAMENDA) 10 MG tablet, Take 10 mg by mouth 2 (two) times daily., Disp: , Rfl:  .  omeprazole (PRILOSEC) 20 MG capsule, Take 20 mg by mouth daily. , Disp: , Rfl:  .  pravastatin (PRAVACHOL) 40 MG tablet, TAKE 1 TABLET BY MOUTH ONCE DAILY, Disp: 30 tablet, Rfl: 11 .  traZODone (DESYREL) 50 MG tablet, Take 50 mg by mouth at bedtime., Disp: , Rfl:  .  cefdinir (OMNICEF) 300 MG capsule, Take 1 capsule (300 mg total) by mouth 2 (two) times daily. 1 po BID, Disp: 20 capsule, Rfl: 0 .  predniSONE (STERAPRED UNI-PAK 21 TAB) 10 MG (21) TBPK tablet, As directed x 6 days, Disp: 21 tablet, Rfl: 0 Continue all other maintenance medications as listed above.  Follow up plan: Recheck 6 months  Educational handout given for [employer] PA-C [place] [address]  [place], [place] [address] [phone]   08/24/2017, 3:49 PM

## 2017-09-21 ENCOUNTER — Other Ambulatory Visit: Payer: Self-pay | Admitting: *Deleted

## 2017-09-21 ENCOUNTER — Encounter: Payer: Self-pay | Admitting: *Deleted

## 2017-09-21 ENCOUNTER — Ambulatory Visit (INDEPENDENT_AMBULATORY_CARE_PROVIDER_SITE_OTHER): Payer: 59 | Admitting: Nurse Practitioner

## 2017-09-21 ENCOUNTER — Encounter: Payer: Self-pay | Admitting: Nurse Practitioner

## 2017-09-21 VITALS — BP 140/78 | HR 62 | Temp 97.4°F | Ht 69.5 in | Wt 244.4 lb

## 2017-09-21 DIAGNOSIS — K219 Gastro-esophageal reflux disease without esophagitis: Secondary | ICD-10-CM | POA: Diagnosis not present

## 2017-09-21 DIAGNOSIS — Z86010 Personal history of colonic polyps: Secondary | ICD-10-CM

## 2017-09-21 DIAGNOSIS — Z800 Family history of malignant neoplasm of digestive organs: Secondary | ICD-10-CM | POA: Diagnosis not present

## 2017-09-21 MED ORDER — NA SULFATE-K SULFATE-MG SULF 17.5-3.13-1.6 GM/177ML PO SOLN: 1.0000 | ORAL | 0 refills | Status: DC

## 2017-09-21 NOTE — Assessment & Plan Note (Signed)
Family history of colon cancer in secondary relatives including 1 and 2 passed away from colon cancer.  Given that she has had multiple polyps removed at her last colonoscopy, she is due for repeat exam given recommendation of 3-year repeat.  See further information as per above.  Return for follow-up based on post procedure recommendations.

## 2017-09-21 NOTE — Assessment & Plan Note (Signed)
History of adenomatous colon polyps.  Last colonoscopy in 2015 with 9 polyps found to be tubular adenoma.  Recommended 3-year repeat exam on Phenergan.  We will proceed with colonoscopy on augmented sedation with propofol.  Follow-up based on post procedure recommendations.  Proceed with colonoscopy on propofol with Dr. Oneida Alar in the near future. The risks, benefits, and alternatives have been discussed in detail with the patient. They state understanding and desire to proceed.   Patient is currently on Xanax, Celexa, trazodone.  No other anticoagulants, anxiolytics, chronic pain medications, or antidepressants.  We will plan for the procedure on propofol/MAC to promote adequate sedation.

## 2017-09-21 NOTE — Assessment & Plan Note (Signed)
GERD symptoms well managed on PPI.  Continue current medications.  Follow-up as needed.

## 2017-09-21 NOTE — Progress Notes (Signed)
Referring Provider: [name], PA-C Primary Care Physician:  [name], PA-C Primary GI:  Dr. Oneida Alar  Chief Complaint  Patient presents with  . Colonoscopy    due for 3 yr tcs    HPI:   Morgan Santos is a 67 y.o. female who presents on recall for 3-year repeat colonoscopy.  The patient was last seen in our office 05/21/2014 for constipation and a family history of colon cancer.  History of chronic constipation worsened the previous 6 months since chemotherapy.  Stools were hard, straining, 3 bowel movements a week.  No other GI symptoms.  GERD okay on Prilosec.  Recommended colonoscopy, start MiraLAX for constipation.  Colonoscopy was completed on 06/05/2014 which found a total of 9 colon polyps, redundant colon, large internal hemorrhoids, melanosis coli.  Surgical pathology found the polyps to be tubular adenoma.  Recommend repeat exam in 3 years on Phenergan.  Continue weight loss efforts.  Today she states she's doing well overall. Denies abdominal pain, N/V, hematochezia, melena, fever, chills, unintentional weight loss, acute changes in bowel habits. GERD well controlled on PPI. Denies chest pain, dyspnea, dizziness, lightheadedness, syncope, near syncope. Denies any other upper or lower GI symptoms.   Past Medical History:  Diagnosis Date  . Anemia    when she was younger  . Anxiety   . Arthritis    hip- R, back; osteoarthritis  . Breast cancer ([place])   . Complication of anesthesia   . Constipation   . Depression    pt. remarks that she "takes respridal because if I don't take it I get angry"   . GERD (gastroesophageal reflux disease)    pt. uses vinegar for heartburn, ususally once per week   . H/O degenerative disc disease   . Hyperlipidemia   . Hypertension    "when I get upset"  Not on medication  . PONV (postoperative nausea and vomiting)    "just for one day"  . Stroke [facility]) ?2010 or 2011   2010, [facility] for stroke "mini stroke" short term  memory problems since  . Wears dentures    top  . Wears glasses     Past Surgical History:  Procedure Laterality Date  . BREAST RECONSTRUCTION WITH PLACEMENT OF TISSUE EXPANDER AND FLEX HD (ACELLULAR HYDRATED DERMIS) Right 07/12/2013   Procedure: RIGHT BREAST RECONSTRUCTION WITH PLACEMENT OF TISSUE EXPANDER AND FLEX HD TO RIGHT BREAST (ACELLULAR HYDRATED DERMIS);  Surgeon: Irene Limbo, MD;  Location: [place];  Service: Plastics;  Laterality: Right;  . BREAST SURGERY     for abcesses- removed from both breasts, many yrs. ago  . CHOLECYSTECTOMY    . COLONOSCOPY   01/29/2004   NUR:A 7-8 mm polyp snared from the hepatic flexure.  Another 3 mm polyp was     cold snared from the sigmoid colon.External hemorrhoids, possible source of recent rectal bleeding  . COLONOSCOPY N/A 06/05/2014   Procedure: COLONOSCOPY;  Surgeon: Danie Binder, MD;  [facility];  Service: Endoscopy;  Laterality: N/A;  1030 - moved to 10:45 - Ginger to notify pt   . DILATION AND CURETTAGE OF UTERUS    . LUMBAR SPINE SURGERY    . MASTECTOMY Right   . MASTECTOMY W/ SENTINEL NODE BIOPSY Right 07/12/2013   Procedure: MASTECTOMY WITH SENTINEL LYMPH NODE BIOPSY;  Surgeon: Rolm Bookbinder, MD;  Location: [place];  Service: General;  Laterality: Right;  . MASTOPEXY Left 01/14/2014   Procedure: LEFT BREAST MASTOPEXY  FOR SYMMETRY ;  Surgeon: Irene Limbo, MD;  Location: [place];  Service: Plastics;  Laterality: Left;  . PORT-A-CATH REMOVAL Left 01/14/2014   Procedure: REMOVAL PORT-A-CATH;  Surgeon: Irene Limbo, MD;  Location: [place];  Service: Plastics;  Laterality: Left;  . PORTACATH PLACEMENT N/A 09/16/2013   Procedure: INSERTION PORT-A-CATH;  Surgeon: Rolm Bookbinder, MD;  Location: [place];  Service: General;  Laterality: N/A;  . REDUCTION MAMMAPLASTY Left   . REMOVAL OF BILATERAL TISSUE EXPANDERS WITH PLACEMENT OF BILATERAL BREAST IMPLANTS Right 01/14/2014    Procedure: REMOVAL OF RIGHT TISSUE EXPANDERS WITH PLACEMENT OF PERMANENT BREAST IMPLANT;  Surgeon: Irene Limbo, MD;  Location: [place];  Service: Plastics;  Laterality: Right;  . TUBAL LIGATION    . VAGINAL HYSTERECTOMY      Current Outpatient Medications  Medication Sig Dispense Refill  . ALPRAZolam (XANAX) 0.5 MG tablet TAKE 1 TABLET BY MOUTH TWICE DAILY AS NEEDED 60 tablet 2  . anastrozole (ARIMIDEX) 1 MG tablet TAKE ONE TABLET BY MOUTH ONCE DAILY 30 tablet 11  . budesonide-formoterol (SYMBICORT) 160-4.5 MCG/ACT inhaler Inhale 1 puff into the lungs 2 (two) times daily. 1 Inhaler 3  . Calcium-Magnesium-Zinc 500-250-12.5 MG TABS Take by mouth 2 (two) times daily.    . Cholecalciferol (VITAMIN D-3) 5000 UNITS TABS Take 2 each by mouth daily.    . citalopram (CELEXA) 40 MG tablet Take 1 tablet (40 mg total) by mouth daily. 90 tablet 3  . donepezil (ARICEPT) 10 MG tablet Take 10 mg by mouth daily.    Marland Kitchen lisinopril (PRINIVIL,ZESTRIL) 10 MG tablet Take 1 tablet (10 mg total) by mouth daily. 90 tablet 0  . meloxicam (MOBIC) 7.5 MG tablet TAKE 1 TABLET BY MOUTH ONCE DAILY 30 tablet 5  . memantine (NAMENDA) 10 MG tablet Take 10 mg by mouth 2 (two) times daily.    Marland Kitchen omeprazole (PRILOSEC) 20 MG capsule Take 20 mg by mouth daily.     . pravastatin (PRAVACHOL) 40 MG tablet TAKE 1 TABLET BY MOUTH ONCE DAILY 30 tablet 11  . traZODone (DESYREL) 50 MG tablet Take 50 mg by mouth at bedtime.    . cefdinir (OMNICEF) 300 MG capsule Take 1 capsule (300 mg total) by mouth 2 (two) times daily. 1 po BID (Patient not taking: Reported on 09/21/2017) 20 capsule 0  . gabapentin (NEURONTIN) 300 MG capsule TAKE 1 CAPSULE BY MOUTH TWICE DAILY (Patient not taking: Reported on 09/21/2017) 60 capsule 0  . predniSONE (STERAPRED UNI-PAK 21 TAB) 10 MG (21) TBPK tablet As directed x 6 days (Patient not taking: Reported on 09/21/2017) 21 tablet 0   No current facility-administered medications for this visit.       Allergies as of 09/21/2017 - Review Complete 09/21/2017  Allergen Reaction Noted  . Codeine Nausea And Vomiting 06/19/2013  . Crestor [rosuvastatin calcium]  02/06/2015  . Lipitor [atorvastatin]  02/06/2015  . Multihance [gadobenate] Nausea Only and Cough 06/18/2013    Family History  Problem Relation Age of Onset  . Hypertension Mother   . Diabetes type II Mother   . CVA Mother   . Heart disease Mother   . Osteoarthritis Mother   . Depression Mother   . Other Mother        degenerative disc disease  . Alcoholism Father   . Pneumonia Father   . Colon cancer Maternal Aunt        age 73s  . Colon cancer Other  maternal great aunt    Social History   Socioeconomic History  . Marital status: Married    Spouse name: None  . Number of children: 4  . Years of education: None  . Highest education level: None  Social Needs  . Financial resource strain: None  . Food insecurity - worry: None  . Food insecurity - inability: None  . Transportation needs - medical: None  . Transportation needs - non-medical: None  Occupational History  . Occupation: retired 2014  Tobacco Use  . Smoking status: Former Smoker    Last attempt to quit: 09/14/1995    Years since quitting: 22.0  . Smokeless tobacco: Never Used  Substance and Sexual Activity  . Alcohol use: Yes    Comment: occ  . Drug use: No    Comment: in the past  . Sexual activity: Yes  Other Topics Concern  . None  Social History Narrative  . None    Review of Systems: Complete ROS negative except as per HPI.   Physical Exam: BP 140/78   Pulse 62   Temp (!) 97.4 F (36.3 C) (Oral)   Ht 5' 9.5" (1.765 m)   Wt 244 lb 6.4 oz (110.9 kg)   BMI 35.57 kg/m  General:   Alert and oriented. Pleasant and cooperative. Well-nourished and well-developed.  Eyes:  Without icterus, sclera clear and conjunctiva pink.  Ears:  Normal auditory acuity. Cardiovascular:  S1, S2 present without murmurs appreciated.  Extremities without clubbing or edema. Respiratory:  Clear to auscultation bilaterally. No wheezes, rales, or rhonchi. No distress.  Gastrointestinal:  +BS, soft, non-tender and non-distended. No HSM noted. No guarding or rebound. No masses appreciated.  Rectal:  Deferred  Musculoskalatal:  Symmetrical without gross deformities. Normal posture. Neurologic:  Alert and oriented x4;  grossly normal neurologically. Psych:  Alert and cooperative. Normal mood and affect. Heme/Lymph/Immune: No excessive bruising noted.    09/21/2017 10:54 AM   Disclaimer: This note was dictated with voice recognition software. Similar sounding words can inadvertently be transcribed and may not be corrected upon review.

## 2017-09-21 NOTE — Patient Instructions (Signed)
1. We will schedule your procedure for you. 2. Return for follow-up based on recommendations made after your procedure. 3. Call us if you have any questions or concerns.

## 2017-09-21 NOTE — Progress Notes (Signed)
cc'ed to pcp °

## 2017-10-06 ENCOUNTER — Telehealth: Payer: Self-pay | Admitting: *Deleted

## 2017-10-06 NOTE — Telephone Encounter (Signed)
"  My appointment was scheduled for April 18 th per the schedule I'm looking at.  Someone called me saying appointment was changed to April 17 th, 2019.  I can't come in this day.  Appointment needs to be rescheduled.  I'd like it rescheduled to a Thursday.  Actully I can come any day.  I just can't come April 17th."   Appointment rescheduled due to provider will be out of office.  Routine scheduling message sent.  Expressed this routine request will be handled.  A scheduler will call closer to expected return for month of April.  Denies further needs or questions with this call.

## 2017-10-09 ENCOUNTER — Telehealth: Payer: Self-pay | Admitting: Hematology and Oncology

## 2017-10-09 NOTE — Telephone Encounter (Signed)
Mailed patient calendar of updated April appointments per 2/15 sch message

## 2017-10-09 NOTE — Patient Instructions (Signed)
Morgan Santos  10/09/2017     @PREFPERIOPPHARMACY @   Your procedure is scheduled on  10/17/2017 .  Report to [facility] at  830   A.M.  Call this number if you have problems the morning of surgery:  [phone]   Remember:  Do not eat food or drink liquids after midnight.  Take these medicines the morning of surgery with A SIP OF WATER  Xanax, armidex, celexa, aricept, mobic. Use your ihaler before you come and bring your rescue inhaler with you come.   Do not wear jewelry, make-up or nail polish.  Do not wear lotions, powders, or perfumes, or deodorant.  Do not shave 48 hours prior to surgery.  Men may shave face and neck.  Do not bring valuables to the hospital.  [facility] is not responsible for any belongings or valuables.  Contacts, dentures or bridgework may not be worn into surgery.  Leave your suitcase in the car.  After surgery it may be brought to your room.  For patients admitted to the hospital, discharge time will be determined by your treatment team.  Patients discharged the day of surgery will not be allowed to drive home.   Name and phone number of your driver:   family Special instructions:  Follow the diet and prep instructions given to you by Dr Nona Dell office.  Please read over the following fact sheets that you were given. Anesthesia Post-op Instructions and Care and Recovery After Surgery       Colonoscopy, Adult A colonoscopy is an exam to look at the large intestine. It is done to check for problems, such as:  Lumps (tumors).  Growths (polyps).  Swelling (inflammation).  Bleeding.  What happens before the procedure? Eating and drinking Follow instructions from your doctor about eating and drinking. These instructions may include:  A few days before the procedure - follow a low-fiber diet. ? Avoid nuts. ? Avoid seeds. ? Avoid dried fruit. ? Avoid raw fruits. ? Avoid vegetables.  1-3 days before the procedure - follow  a clear liquid diet. Avoid liquids that have red or purple dye. Drink only clear liquids, such as: ? Clear broth or bouillon. ? Black coffee or tea. ? Clear juice. ? Clear soft drinks or sports drinks. ? Gelatin dessert. ? Popsicles.  On the day of the procedure - do not eat or drink anything during the 2 hours before the procedure.  Bowel prep If you were prescribed an oral bowel prep:  Take it as told by your doctor. Starting the day before your procedure, you will need to drink a lot of liquid. The liquid will cause you to poop (have bowel movements) until your poop is almost clear or light green.  If your skin or butt gets irritated from diarrhea, you may: ? Wipe the area with wipes that have medicine in them, such as adult wet wipes with aloe and vitamin E. ? Put something on your skin that soothes the area, such as petroleum jelly.  If you throw up (vomit) while drinking the bowel prep, take a break for up to 60 minutes. Then begin the bowel prep again. If you keep throwing up and you cannot take the bowel prep without throwing up, call your doctor.  General instructions  Ask your doctor about changing or stopping your normal medicines. This is important if you take diabetes medicines or blood thinners.  Plan to have someone take  you home from the hospital or clinic. What happens during the procedure?  An IV tube may be put into one of your veins.  You will be given medicine to help you relax (sedative).  To reduce your risk of infection: ? Your doctors will wash their hands. ? Your anal area will be washed with soap.  You will be asked to lie on your side with your knees bent.  Your doctor will get a long, thin, flexible tube ready. The tube will have a camera and a light on the end.  The tube will be put into your anus.  The tube will be gently put into your large intestine.  Air will be delivered into your large intestine to keep it open. You may feel some  pressure or cramping.  The camera will be used to take photos.  A small tissue sample may be removed from your body to be looked at under a microscope (biopsy). If any possible problems are found, the tissue will be sent to a lab for testing.  If small growths are found, your doctor may remove them and have them checked for cancer.  The tube that was put into your anus will be slowly removed. The procedure may vary among doctors and hospitals. What happens after the procedure?  Your doctor will check on you often until the medicines you were given have worn off.  Do not drive for 24 hours after the procedure.  You may have a small amount of blood in your poop.  You may pass gas.  You may have mild cramps or bloating in your belly (abdomen).  It is up to you to get the results of your procedure. Ask your doctor, or the department performing the procedure, when your results will be ready. This information is not intended to replace advice given to you by your health care provider. Make sure you discuss any questions you have with your health care provider. Document Released: 09/10/2010 Document Revised: 06/08/2016 Document Reviewed: 10/20/2015 Elsevier Interactive Patient Education  2017 [employer] Inc[employer]  Colonoscopy, Adult, Care After This sheet gives you information about how to care for yourself after your procedure. Your health care provider may also give you more specific instructions. If you have problems or questions, contact your health care provider. What can I expect after the procedure? After the procedure, it is common to have:  A small amount of blood in your stool for 24 hours after the procedure.  Some gas.  Mild abdominal cramping or bloating.  Follow these instructions at home: General instructions   For the first 24 hours after the procedure: ? Do not drive or use machinery. ? Do not sign important documents. ? Do not drink alcohol. ? Do your regular daily  activities at a slower pace than normal. ? Eat soft, easy-to-digest foods. ? Rest often.  Take over-the-counter or prescription medicines only as told by your health care provider.  It is up to you to get the results of your procedure. Ask your health care provider, or the department performing the procedure, when your results will be ready. Relieving cramping and bloating  Try walking around when you have cramps or feel bloated.  Apply heat to your abdomen as told by your health care provider. Use a heat source that your health care provider recommends, such as a moist heat pack or a heating pad. ? Place a towel between your skin and the heat source. ? Leave the heat on for 20-30  minutes. ? Remove the heat if your skin turns bright red. This is especially important if you are unable to feel pain, heat, or cold. You may have a greater risk of getting burned. Eating and drinking  Drink enough fluid to keep your urine clear or pale yellow.  Resume your normal diet as instructed by your health care provider. Avoid heavy or fried foods that are hard to digest.  Avoid drinking alcohol for as long as instructed by your health care provider. Contact a health care provider if:  You have blood in your stool 2-3 days after the procedure. Get help right away if:  You have more than a small spotting of blood in your stool.  You pass large blood clots in your stool.  Your abdomen is swollen.  You have nausea or vomiting.  You have a fever.  You have increasing abdominal pain that is not relieved with medicine. This information is not intended to replace advice given to you by your health care provider. Make sure you discuss any questions you have with your health care provider. Document Released: 03/22/2004 Document Revised: 05/02/2016 Document Reviewed: 10/20/2015 Elsevier Interactive Patient Education  2018 [place] Anesthesia is a term that refers to  techniques, procedures, and medicines that help a person stay safe and comfortable during a medical procedure. Monitored anesthesia care, or sedation, is one type of anesthesia. Your anesthesia specialist may recommend sedation if you will be having a procedure that does not require you to be unconscious, such as:  Cataract surgery.  A dental procedure.  A biopsy.  A colonoscopy.  During the procedure, you may receive a medicine to help you relax (sedative). There are three levels of sedation:  Mild sedation. At this level, you may feel awake and relaxed. You will be able to follow directions.  Moderate sedation. At this level, you will be sleepy. You may not remember the procedure.  Deep sedation. At this level, you will be asleep. You will not remember the procedure.  The more medicine you are given, the deeper your level of sedation will be. Depending on how you respond to the procedure, the anesthesia specialist may change your level of sedation or the type of anesthesia to fit your needs. An anesthesia specialist will monitor you closely during the procedure. Let your health care provider know about:  Any allergies you have.  All medicines you are taking, including vitamins, herbs, eye drops, creams, and over-the-counter medicines.  Any use of steroids (by mouth or as a cream).  Any problems you or family members have had with sedatives and anesthetic medicines.  Any blood disorders you have.  Any surgeries you have had.  Any medical conditions you have, such as sleep apnea.  Whether you are pregnant or may be pregnant.  Any use of cigarettes, alcohol, or street drugs. What are the risks? Generally, this is a safe procedure. However, problems may occur, including:  Getting too much medicine (oversedation).  Nausea.  Allergic reaction to medicines.  Trouble breathing. If this happens, a breathing tube may be used to help with breathing. It will be removed when you  are awake and breathing on your own.  Heart trouble.  Lung trouble.  Before the procedure Staying hydrated Follow instructions from your health care provider about hydration, which may include:  Up to 2 hours before the procedure - you may continue to drink clear liquids, such as water, clear fruit juice, black coffee, and plain  tea.  Eating and drinking restrictions Follow instructions from your health care provider about eating and drinking, which may include:  8 hours before the procedure - stop eating heavy meals or foods such as meat, fried foods, or fatty foods.  6 hours before the procedure - stop eating light meals or foods, such as toast or cereal.  6 hours before the procedure - stop drinking milk or drinks that contain milk.  2 hours before the procedure - stop drinking clear liquids.  Medicines Ask your health care provider about:  Changing or stopping your regular medicines. This is especially important if you are taking diabetes medicines or blood thinners.  Taking medicines such as aspirin and ibuprofen. These medicines can thin your blood. Do not take these medicines before your procedure if your health care provider instructs you not to.  Tests and exams  You will have a physical exam.  You may have blood tests done to show: ? How well your kidneys and liver are working. ? How well your blood can clot.  General instructions  Plan to have someone take you home from the hospital or clinic.  If you will be going home right after the procedure, plan to have someone with you for 24 hours.  What happens during the procedure?  Your blood pressure, heart rate, breathing, level of pain and overall condition will be monitored.  An IV tube will be inserted into one of your veins.  Your anesthesia specialist will give you medicines as needed to keep you comfortable during the procedure. This may mean changing the level of sedation.  The procedure will be  performed. After the procedure  Your blood pressure, heart rate, breathing rate, and blood oxygen level will be monitored until the medicines you were given have worn off.  Do not drive for 24 hours if you received a sedative.  You may: ? Feel sleepy, clumsy, or nauseous. ? Feel forgetful about what happened after the procedure. ? Have a sore throat if you had a breathing tube during the procedure. ? Vomit. This information is not intended to replace advice given to you by your health care provider. Make sure you discuss any questions you have with your health care provider. Document Released: 05/04/2005 Document Revised: 01/15/2016 Document Reviewed: 11/29/2015 Elsevier Interactive Patient Education  2018 [place], Care After These instructions provide you with information about caring for yourself after your procedure. Your health care provider may also give you more specific instructions. Your treatment has been planned according to current medical practices, but problems sometimes occur. Call your health care provider if you have any problems or questions after your procedure. What can I expect after the procedure? After your procedure, it is common to:  Feel sleepy for several hours.  Feel clumsy and have poor balance for several hours.  Feel forgetful about what happened after the procedure.  Have poor judgment for several hours.  Feel nauseous or vomit.  Have a sore throat if you had a breathing tube during the procedure.  Follow these instructions at home: For at least 24 hours after the procedure:   Do not: ? Participate in activities in which you could fall or become injured. ? Drive. ? Use heavy machinery. ? Drink alcohol. ? Take sleeping pills or medicines that cause drowsiness. ? Make important decisions or sign legal documents. ? Take care of children on your own.  Rest. Eating and drinking  Follow the diet that is  recommended by your health care provider.  If you vomit, drink water, juice, or soup when you can drink without vomiting.  Make sure you have little or no nausea before eating solid foods. General instructions  Have a responsible adult stay with you until you are awake and alert.  Take over-the-counter and prescription medicines only as told by your health care provider.  If you smoke, do not smoke without supervision.  Keep all follow-up visits as told by your health care provider. This is important. Contact a health care provider if:  You keep feeling nauseous or you keep vomiting.  You feel light-headed.  You develop a rash.  You have a fever. Get help right away if:  You have trouble breathing. This information is not intended to replace advice given to you by your health care provider. Make sure you discuss any questions you have with your health care provider. Document Released: 11/29/2015 Document Revised: 03/30/2016 Document Reviewed: 11/29/2015 Elsevier Interactive Patient Education  [employer].

## 2017-10-10 ENCOUNTER — Other Ambulatory Visit: Payer: Self-pay

## 2017-10-10 ENCOUNTER — Encounter (HOSPITAL_COMMUNITY): Payer: Self-pay

## 2017-10-10 ENCOUNTER — Encounter (HOSPITAL_COMMUNITY)
Admission: RE | Admit: 2017-10-10 | Discharge: 2017-10-10 | Disposition: A | Discharge status: Home / Self Care | Payer: 59 | Source: Ambulatory Visit | Attending: Gastroenterology | Admitting: Gastroenterology

## 2017-10-10 DIAGNOSIS — Z01818 Encounter for other preprocedural examination: Secondary | ICD-10-CM | POA: Insufficient documentation

## 2017-10-10 DIAGNOSIS — R9431 Abnormal electrocardiogram [ECG] [EKG]: Secondary | ICD-10-CM | POA: Diagnosis not present

## 2017-10-10 DIAGNOSIS — I447 Left bundle-branch block, unspecified: Secondary | ICD-10-CM | POA: Insufficient documentation

## 2017-10-10 HISTORY — DX: Unspecified asthma, uncomplicated: J45.909

## 2017-10-11 ENCOUNTER — Encounter (HOSPITAL_COMMUNITY)
Admission: RE | Admit: 2017-10-11 | Discharge: 2017-10-11 | Disposition: A | Discharge status: Home / Self Care | Payer: 59 | Source: Ambulatory Visit | Attending: Gastroenterology | Admitting: Gastroenterology

## 2017-10-17 ENCOUNTER — Encounter (HOSPITAL_COMMUNITY)
Admission: RE | Disposition: A | Discharge status: Home / Self Care | Payer: Self-pay | Source: Ambulatory Visit | Attending: Gastroenterology

## 2017-10-17 ENCOUNTER — Ambulatory Visit (HOSPITAL_COMMUNITY): Payer: 59 | Admitting: Anesthesiology

## 2017-10-17 ENCOUNTER — Other Ambulatory Visit: Payer: Self-pay

## 2017-10-17 ENCOUNTER — Encounter (HOSPITAL_COMMUNITY): Payer: Self-pay | Admitting: *Deleted

## 2017-10-17 ENCOUNTER — Ambulatory Visit (HOSPITAL_COMMUNITY)
Admission: RE | Admit: 2017-10-17 | Discharge: 2017-10-17 | Disposition: A | Discharge status: Home / Self Care | Payer: 59 | Source: Ambulatory Visit | Attending: Gastroenterology | Admitting: Gastroenterology

## 2017-10-17 DIAGNOSIS — M479 Spondylosis, unspecified: Secondary | ICD-10-CM | POA: Insufficient documentation

## 2017-10-17 DIAGNOSIS — F419 Anxiety disorder, unspecified: Secondary | ICD-10-CM | POA: Diagnosis not present

## 2017-10-17 DIAGNOSIS — Z86010 Personal history of colonic polyps: Secondary | ICD-10-CM | POA: Diagnosis not present

## 2017-10-17 DIAGNOSIS — Z7982 Long term (current) use of aspirin: Secondary | ICD-10-CM | POA: Insufficient documentation

## 2017-10-17 DIAGNOSIS — F329 Major depressive disorder, single episode, unspecified: Secondary | ICD-10-CM | POA: Insufficient documentation

## 2017-10-17 DIAGNOSIS — K644 Residual hemorrhoidal skin tags: Secondary | ICD-10-CM | POA: Insufficient documentation

## 2017-10-17 DIAGNOSIS — I447 Left bundle-branch block, unspecified: Secondary | ICD-10-CM | POA: Insufficient documentation

## 2017-10-17 DIAGNOSIS — J45909 Unspecified asthma, uncomplicated: Secondary | ICD-10-CM | POA: Diagnosis not present

## 2017-10-17 DIAGNOSIS — Z87891 Personal history of nicotine dependence: Secondary | ICD-10-CM | POA: Diagnosis not present

## 2017-10-17 DIAGNOSIS — Z8673 Personal history of transient ischemic attack (TIA), and cerebral infarction without residual deficits: Secondary | ICD-10-CM | POA: Diagnosis not present

## 2017-10-17 DIAGNOSIS — Z1211 Encounter for screening for malignant neoplasm of colon: Secondary | ICD-10-CM | POA: Diagnosis not present

## 2017-10-17 DIAGNOSIS — Z885 Allergy status to narcotic agent status: Secondary | ICD-10-CM | POA: Insufficient documentation

## 2017-10-17 DIAGNOSIS — E785 Hyperlipidemia, unspecified: Secondary | ICD-10-CM | POA: Diagnosis not present

## 2017-10-17 DIAGNOSIS — K5730 Diverticulosis of large intestine without perforation or abscess without bleeding: Secondary | ICD-10-CM | POA: Diagnosis not present

## 2017-10-17 DIAGNOSIS — Z800 Family history of malignant neoplasm of digestive organs: Secondary | ICD-10-CM | POA: Diagnosis not present

## 2017-10-17 DIAGNOSIS — K648 Other hemorrhoids: Secondary | ICD-10-CM | POA: Diagnosis not present

## 2017-10-17 DIAGNOSIS — Z79899 Other long term (current) drug therapy: Secondary | ICD-10-CM | POA: Insufficient documentation

## 2017-10-17 DIAGNOSIS — Z853 Personal history of malignant neoplasm of breast: Secondary | ICD-10-CM | POA: Insufficient documentation

## 2017-10-17 DIAGNOSIS — M1611 Unilateral primary osteoarthritis, right hip: Secondary | ICD-10-CM | POA: Diagnosis not present

## 2017-10-17 DIAGNOSIS — I10 Essential (primary) hypertension: Secondary | ICD-10-CM | POA: Diagnosis not present

## 2017-10-17 DIAGNOSIS — Z8249 Family history of ischemic heart disease and other diseases of the circulatory system: Secondary | ICD-10-CM | POA: Insufficient documentation

## 2017-10-17 DIAGNOSIS — K219 Gastro-esophageal reflux disease without esophagitis: Secondary | ICD-10-CM | POA: Diagnosis not present

## 2017-10-17 HISTORY — PX: COLONOSCOPY WITH PROPOFOL: SHX5780

## 2017-10-17 SURGERY — COLONOSCOPY WITH PROPOFOL
Anesthesia: Monitor Anesthesia Care

## 2017-10-17 MED ORDER — PROPOFOL 10 MG/ML IV BOLUS
INTRAVENOUS | Status: DC | PRN
  Administered 2017-10-17 (×2): 20 mg via INTRAVENOUS

## 2017-10-17 MED ORDER — EPHEDRINE SULFATE 50 MG/ML IJ SOLN
INTRAMUSCULAR | Status: DC | PRN
  Administered 2017-10-17 (×2): 5 mg via INTRAVENOUS

## 2017-10-17 MED ORDER — OMEPRAZOLE 20 MG PO CPDR: DELAYED_RELEASE_CAPSULE | ORAL | 3 refills | Status: DC

## 2017-10-17 MED ORDER — PROPOFOL 500 MG/50ML IV EMUL
INTRAVENOUS | Status: DC | PRN
  Administered 2017-10-17: 150 ug/kg/min via INTRAVENOUS
  Administered 2017-10-17: 10:00:00 via INTRAVENOUS

## 2017-10-17 MED ORDER — MIDAZOLAM HCL 2 MG/2ML IJ SOLN
INTRAMUSCULAR | Status: AC
  Filled 2017-10-17: qty 2

## 2017-10-17 MED ORDER — PROPOFOL 10 MG/ML IV BOLUS
INTRAVENOUS | Status: AC
  Filled 2017-10-17: qty 20

## 2017-10-17 MED ORDER — LACTATED RINGERS IV SOLN
INTRAVENOUS | Status: DC
  Administered 2017-10-17: 10:00:00 via INTRAVENOUS

## 2017-10-17 MED ORDER — LIDOCAINE HCL (PF) 1 % IJ SOLN
INTRAMUSCULAR | Status: DC | PRN
  Administered 2017-10-17: 20 mg

## 2017-10-17 MED ORDER — SODIUM CHLORIDE 0.9 % IJ SOLN
INTRAMUSCULAR | Status: AC
  Filled 2017-10-17: qty 10

## 2017-10-17 MED ORDER — MIDAZOLAM HCL 2 MG/2ML IJ SOLN
1.0000 mg | Freq: Once | INTRAMUSCULAR | Status: AC | PRN
  Administered 2017-10-17: 2 mg via INTRAVENOUS

## 2017-10-17 MED ORDER — ONDANSETRON 4 MG PO TBDP
ORAL_TABLET | ORAL | Status: AC
  Filled 2017-10-17: qty 1

## 2017-10-17 MED ORDER — ONDANSETRON 4 MG PO TBDP
4.0000 mg | ORAL_TABLET | Freq: Once | ORAL | Status: AC
  Administered 2017-10-17: 4 mg via ORAL

## 2017-10-17 MED ORDER — EPHEDRINE SULFATE 50 MG/ML IJ SOLN
INTRAMUSCULAR | Status: AC
  Filled 2017-10-17: qty 1

## 2017-10-17 NOTE — Anesthesia Preprocedure Evaluation (Addendum)
Anesthesia Evaluation  Patient identified by MRN, date of birth, ID band Patient awake    History of Anesthesia Complications (+) PONV  Airway Mallampati: I  TM Distance: >3 FB Neck ROM: Full    Dental no notable dental hx. (+) Edentulous Upper, Poor Dentition   Pulmonary asthma , former smoker,    Pulmonary exam normal        Cardiovascular Exercise Tolerance: Good hypertension, Pt. on medications  Rhythm:Regular Rate:Normal  10-Oct-2017 13:01:54 [facility]-300 ROUTINE RECORD Normal sinus rhythm Left bundle branch block   Neuro/Psych Anxiety Depression CVA, No Residual Symptoms    GI/Hepatic GERD  Medicated,  Endo/Other    Renal/GU      Musculoskeletal  (+) Arthritis ,   Abdominal Normal abdominal exam  (+)   Peds  Hematology  (+) anemia ,   Anesthesia Other Findings   Reproductive/Obstetrics                             Anesthesia Physical Anesthesia Plan  ASA: III  Anesthesia Plan: MAC   Post-op Pain Management:    Induction:   PONV Risk Score and Plan: Ondansetron and Midazolam  Airway Management Planned: Simple Face Mask  Additional Equipment:   Intra-op Plan:   Post-operative Plan:   Informed Consent: I have reviewed the patients History and Physical, chart, labs and discussed the procedure including the risks, benefits and alternatives for the proposed anesthesia with the patient or authorized representative who has indicated his/her understanding and acceptance.   Dental advisory given  Plan Discussed with: CRNA  Anesthesia Plan Comments:        Anesthesia Quick Evaluation

## 2017-10-17 NOTE — H&P (Signed)
Primary Care Physician:  [name], PA-C Primary Gastroenterologist:  Dr. Oneida Alar  Pre-Procedure History & Physical: HPI:  Morgan Santos is a 67 y.o. female here for  PERSONAL HISTORY OF POLYPS.  Past Medical History:  Diagnosis Date  . Anemia    when she was younger  . Anxiety   . Arthritis    hip- R, back; osteoarthritis  . Asthma   . Breast cancer ([place])   . Complication of anesthesia   . Constipation   . Depression    pt. remarks that she "takes respridal because if I don't take it I get angry"   . GERD (gastroesophageal reflux disease)    pt. uses vinegar for heartburn, ususally once per week   . H/O degenerative disc disease   . Hyperlipidemia   . Hypertension    "when I get upset"  Not on medication  . PONV (postoperative nausea and vomiting)    "just for one day"  . Stroke [facility]) ?2010 or 2011   2010, [facility] for stroke "mini stroke" short term memory problems since    Past Surgical History:  Procedure Laterality Date  . BREAST RECONSTRUCTION WITH PLACEMENT OF TISSUE EXPANDER AND FLEX HD (ACELLULAR HYDRATED DERMIS) Right 07/12/2013   Procedure: RIGHT BREAST RECONSTRUCTION WITH PLACEMENT OF TISSUE EXPANDER AND FLEX HD TO RIGHT BREAST (ACELLULAR HYDRATED DERMIS);  Surgeon: Irene Limbo, MD;  Location: [place];  Service: Plastics;  Laterality: Right;  . BREAST SURGERY     for abcesses- removed from both breasts, many yrs. ago  . CHOLECYSTECTOMY    . COLONOSCOPY   01/29/2004   NUR:A 7-8 mm polyp snared from the hepatic flexure.  Another 3 mm polyp was     cold snared from the sigmoid colon.External hemorrhoids, possible source of recent rectal bleeding  . COLONOSCOPY N/A 06/05/2014   Procedure: COLONOSCOPY;  Surgeon: Danie Binder, MD;  [facility];  Service: Endoscopy;  Laterality: N/A;  1030 - moved to 10:45 - [place] to notify pt   . DILATION AND CURETTAGE OF UTERUS    . LUMBAR SPINE SURGERY    . MASTECTOMY Right   . MASTECTOMY W/  SENTINEL NODE BIOPSY Right 07/12/2013   Procedure: MASTECTOMY WITH SENTINEL LYMPH NODE BIOPSY;  Surgeon: Rolm Bookbinder, MD;  Location: [place];  Service: General;  Laterality: Right;  . MASTOPEXY Left 01/14/2014   Procedure: LEFT BREAST MASTOPEXY FOR SYMMETRY ;  Surgeon: Irene Limbo, MD;  Location: [place];  Service: Plastics;  Laterality: Left;  . PORT-A-CATH REMOVAL Left 01/14/2014   Procedure: REMOVAL PORT-A-CATH;  Surgeon: Irene Limbo, MD;  Location: [place];  Service: Plastics;  Laterality: Left;  . PORTACATH PLACEMENT N/A 09/16/2013   Procedure: INSERTION PORT-A-CATH;  Surgeon: Rolm Bookbinder, MD;  Location: [place];  Service: General;  Laterality: N/A;  . REDUCTION MAMMAPLASTY Left   . REMOVAL OF BILATERAL TISSUE EXPANDERS WITH PLACEMENT OF BILATERAL BREAST IMPLANTS Right 01/14/2014   Procedure: REMOVAL OF RIGHT TISSUE EXPANDERS WITH PLACEMENT OF PERMANENT BREAST IMPLANT;  Surgeon: Irene Limbo, MD;  Location: [place];  Service: Plastics;  Laterality: Right;  . TUBAL LIGATION    . VAGINAL HYSTERECTOMY      Prior to Admission medications   Medication Sig Start Date End Date Taking? Authorizing Provider  ALPRAZolam (XANAX) 0.5 MG tablet TAKE 1 TABLET BY MOUTH TWICE DAILY AS NEEDED Patient taking differently: TAKE 1 TABLET BY MOUTH TWICE DAILY AS NEEDED FOR ANXIETY  08/10/17  Yes [name], PA-C  anastrozole (ARIMIDEX) 1 MG tablet TAKE ONE TABLET BY MOUTH ONCE DAILY 06/23/17  Yes Nicholas Lose, MD  ARTIFICIAL TEAR OP Place 1 drop into both eyes daily as needed (dry eyes).   Yes [provider]  aspirin EC 81 MG tablet Take 81 mg by mouth every other day.   Yes [provider]  Calcium-Magnesium-Zinc 500-250-12.5 MG TABS Take 1 tablet by mouth daily.    Yes [provider]  Cholecalciferol (VITAMIN D-3) 5000 UNITS TABS Take 10,000 Units by mouth daily.    Yes [provider]   citalopram (CELEXA) 40 MG tablet Take 1 tablet (40 mg total) by mouth daily. 08/24/17  Yes [name], PA-C  Cyanocobalamin (B-12) 2500 MCG SUBL Place 2,500 mcg under the tongue daily.   Yes [provider]  donepezil (ARICEPT) 10 MG tablet Take 10 mg by mouth daily. 05/02/16  Yes [provider]  meloxicam (MOBIC) 7.5 MG tablet TAKE 1 TABLET BY MOUTH ONCE DAILY 07/17/17  Yes Particia Nearing S, PA-C  Na Sulfate-K Sulfate-Mg Sulf 17.5-3.13-1.6 GM/177ML SOLN Take 1 kit by mouth as directed. 09/21/17  Yes Gussie Towson L, MD  pravastatin (PRAVACHOL) 40 MG tablet TAKE 1 TABLET BY MOUTH ONCE DAILY 07/20/17  Yes [name], PA-C  traZODone (DESYREL) 50 MG tablet Take 50 mg by mouth at bedtime as needed for sleep.    Yes [provider]  budesonide-formoterol (SYMBICORT) 160-4.5 MCG/ACT inhaler Inhale 1 puff into the lungs 2 (two) times daily. Patient taking differently: Inhale 1 puff into the lungs 2 (two) times daily as needed (shortness of breath).  08/24/17   [name], PA-C  cefdinir (OMNICEF) 300 MG capsule Take 1 capsule (300 mg total) by mouth 2 (two) times daily. 1 po BID Patient not taking: Reported on 09/21/2017 08/24/17   [name], PA-C  gabapentin (NEURONTIN) 300 MG capsule TAKE 1 CAPSULE BY MOUTH TWICE DAILY Patient not taking: Reported on 09/21/2017 06/02/17   [name], PA-C  lisinopril (PRINIVIL,ZESTRIL) 10 MG tablet Take 1 tablet (10 mg total) by mouth daily. Patient not taking: Reported on 10/02/2017 03/13/17   [name], PA-C  predniSONE (STERAPRED UNI-PAK 21 TAB) 10 MG (21) TBPK tablet As directed x 6 days Patient not taking: Reported on 09/21/2017 08/24/17   [name], PA-C    Allergies as of 09/21/2017 - Review Complete 09/21/2017  Allergen Reaction Noted  . Codeine Nausea And Vomiting 06/19/2013  . Crestor [rosuvastatin calcium]  02/06/2015  . Lipitor [atorvastatin]  02/06/2015  . Multihance [gadobenate] Nausea Only and Cough  06/18/2013    Family History  Problem Relation Age of Onset  . Hypertension Mother   . Diabetes type II Mother   . CVA Mother   . Heart disease Mother   . Osteoarthritis Mother   . Depression Mother   . Other Mother        degenerative disc disease  . Alcoholism Father   . Pneumonia Father   . Colon cancer Maternal Aunt        age 6s  . Colon cancer Other        maternal great aunt  . Colon polyps Neg Hx     Social History   Socioeconomic History  . Marital status: Married    Spouse name: Not on file  . Number of children: 4  . Years of education: Not on file  . Highest education  level: Not on file  Social Needs  . Financial resource strain: Not on file  . Food insecurity - worry: Not on file  . Food insecurity - inability: Not on file  . Transportation needs - medical: Not on file  . Transportation needs - non-medical: Not on file  Occupational History  . Occupation: retired 2014  Tobacco Use  . Smoking status: Former Smoker    Packs/day: 0.25    Years: 2.00    Pack years: 0.50    Types: Cigarettes    Last attempt to quit: 09/14/1995    Years since quitting: 22.1  . Smokeless tobacco: Never Used  Substance and Sexual Activity  . Alcohol use: Yes    Comment: occ  . Drug use: No    Comment: in the past  . Sexual activity: Yes  Other Topics Concern  . Not on file  Social History Narrative  . Not on file    Review of Systems: See HPI, otherwise negative ROS   Physical Exam: BP (!) 146/78   Temp 98.1 F (36.7 C) (Oral)   Resp (!) 27   SpO2 98%  General:   Alert,  pleasant and cooperative in NAD Head:  Normocephalic and atraumatic. Neck:  Supple; Lungs:  Clear throughout to auscultation.    Heart:  Regular rate and rhythm. Abdomen:  Soft, nontender and nondistended. Normal bowel sounds, without guarding, and without rebound.   Neurologic:  Alert and  oriented x4;  grossly normal neurologically.  Impression/Plan:     PERSONAL HISTORY OF  POLYPS.  PLAN: 1. TCS TODAY DISCUSSED PROCEDURE, BENEFITS, & RISKS: < 1% chance of medication reaction, bleeding, perforation, or rupture of spleen/liver.

## 2017-10-17 NOTE — Op Note (Signed)
[facility] Patient Name: Morgan Santos Procedure Date: 10/17/2017 10:10 AM MRN: [id number] Date of Birth: [birth year] Attending MD: Barney Drain MD, MD CSN: [id number] Age: 67 Admit Type: Outpatient Procedure:                Colonoscopy, SCREENING Indications:              Personal history of colonic polyps Providers:                Barney Drain MD, MD, Janeece Riggers, RN, Randa Spike, Technician Referring MD:              Medicines:                Propofol per Anesthesia Complications:            No immediate complications. Estimated Blood Loss:     Estimated blood loss: none. Procedure:                Pre-Anesthesia Assessment:                           - Prior to the procedure, a History and Physical                            was performed, and patient medications and                            allergies were reviewed. The patient's tolerance of                            previous anesthesia was also reviewed. The risks                            and benefits of the procedure and the sedation                            options and risks were discussed with the patient.                            All questions were answered, and informed consent                            was obtained. Prior Anticoagulants: The patient has                            taken aspirin, last dose was 3 days prior to                            procedure. ASA Grade Assessment: II - A patient                            with mild systemic disease. After reviewing the  risks and benefits, the patient was deemed in                            satisfactory condition to undergo the procedure.                            After obtaining informed consent, the colonoscope                            was passed under direct vision. Throughout the                            procedure, the patient's blood pressure, pulse, and                            oxygen  saturations were monitored continuously. The                            EC-[id number] ([id number]) scope was introduced through                            the anus and advanced to the the cecum, identified                            by appendiceal orifice and ileocecal valve. The                            colonoscopy was somewhat difficult due to a                            tortuous colon. Successful completion of the                            procedure was aided by straightening and shortening                            the scope to obtain bowel loop reduction and                            COLOWRAP. The patient tolerated the procedure well.                            The quality of the bowel preparation was excellent.                            The ileocecal valve, appendiceal orifice, and                            rectum were photographed. Scope In: 10:22:39 AM Scope Out: 10:38:43 AM Scope Withdrawal Time: 0 hours 12 minutes 10 seconds  Total Procedure Duration: 0 hours 16 minutes 4 seconds  Findings:      Multiple small and large-mouthed diverticula were found in the       recto-sigmoid colon and sigmoid  colon.      The recto-sigmoid colon and sigmoid colon revealed moderately excessive       looping.      External and internal hemorrhoids were found during retroflexion. The       hemorrhoids were large. Impression:               - MILD Diverticulosis in the recto-sigmoid colon,                            in the sigmoid colon and in the descending colon.                           - There was significant looping of the colon.                           - External and internal hemorrhoids. Moderate Sedation:      Per Anesthesia Care Recommendation:           - Repeat colonoscopy in 5 years for surveillance.                           - High fiber diet.                           - Continue present medications.                           - Patient has a contact number available for                             emergencies. The signs and symptoms of potential                            delayed complications were discussed with the                            patient. Return to normal activities tomorrow.                            Written discharge instructions were provided to the                            patient. Procedure Code(s):        --- Professional ---                           [phone], Colonoscopy, flexible; diagnostic, including                            collection of specimen(s) by brushing or washing,                            when performed (separate procedure) Diagnosis Code(s):        --- Professional ---  K64.8, Other hemorrhoids                           Z86.010, Personal history of colonic polyps                           K57.30, Diverticulosis of large intestine without                            perforation or abscess without bleeding CPT copyright 2016 [employer]. All rights reserved. The codes documented in this report are preliminary and upon coder review may  be revised to meet current compliance requirements. Barney Drain, MD Barney Drain MD, MD 10/17/2017 10:52:22 AM This report has been signed electronically. Number of Addenda: 0

## 2017-10-17 NOTE — Addendum Note (Signed)
Addendum  created 10/17/17 1136 by Vista Deck, CRNA   Sign clinical note

## 2017-10-17 NOTE — Anesthesia Postprocedure Evaluation (Signed)
Anesthesia Post Note  Patient: Morgan Santos  Procedure(s) Performed: COLONOSCOPY WITH PROPOFOL (N/A )  Patient location during evaluation: PACU Anesthesia Type: MAC Level of consciousness: awake, awake and alert, oriented and patient cooperative Pain management: pain level controlled Vital Signs Assessment: post-procedure vital signs reviewed and stable Respiratory status: spontaneous breathing, nonlabored ventilation, respiratory function stable and patient connected to nasal cannula oxygen Cardiovascular status: stable Postop Assessment: no apparent nausea or vomiting Anesthetic complications: no     Last Vitals:  Vitals:   10/17/17 1000 10/17/17 1005  BP: (!) 144/84 (!) 135/106  Resp: (!) 21 15  Temp:    SpO2: 95% 98%    Last Pain:  Vitals:   10/17/17 0928  TempSrc: Oral                 Ezabella Teska L

## 2017-10-17 NOTE — Transfer of Care (Signed)
Immediate Anesthesia Transfer of Care Note  Patient: Morgan Santos  Procedure(s) Performed: COLONOSCOPY WITH PROPOFOL (N/A )  Patient Location: PACU  Anesthesia Type:MAC  Level of Consciousness: awake, alert , oriented and patient cooperative  Airway & Oxygen Therapy: Patient Spontanous Breathing and Patient connected to nasal cannula oxygen  Post-op Assessment: Report given to RN and Post -op Vital signs reviewed and stable  Post vital signs: Reviewed and stable  Last Vitals:  Vitals:   10/17/17 1000 10/17/17 1005  BP: (!) 144/84 (!) 135/106  Resp: (!) 21 15  Temp:    SpO2: 95% 98%    Last Pain:  Vitals:   10/17/17 0928  TempSrc: Oral         Complications: No apparent anesthesia complications

## 2017-10-17 NOTE — Discharge Instructions (Signed)
You have LARGE internal AND EXTERNAL hemorrhoids and diverticulosis IN YOUR LEFT COLON. YOU DID NOT HAVE ANY POLYPS.    DRINK WATER TO KEEP YOUR URINE LIGHT YELLOW.  CONTINUE YOUR WEIGHT LOSS EFFORTS.  WHILE I DO NOT WANT TO ALARM YOU, YOUR BODY MASS INDEX IS OVER 30 WHICH MEANS YOU ARE OBESE. OBESITY IS ASSOCIATED WITH AN INCREASED RISK FOR CIRRHOSIS AND ALL CANCERS, INCLUDING ESOPHAGEAL AND COLON CANCER. A WEIGHT OF 205 LBS OR LESS   WILL GET YOUR BODY MASS INDEX(BMI) UNDER 30.  FOLLOW A HIGH FIBER DIET. AVOID ITEMS THAT CAUSE BLOATING & GAS. SEE INFO BELOW.   Next colonoscopy in 5 years.   Colonoscopy Care After Read the instructions outlined below and refer to this sheet in the next week. These discharge instructions provide you with general information on caring for yourself after you leave the hospital. While your treatment has been planned according to the most current medical practices available, unavoidable complications occasionally occur. If you have any problems or questions after discharge, call DR. Adrin Julian, [phone].  ACTIVITY  You may resume your regular activity, but move at a slower pace for the next 24 hours.   Take frequent rest periods for the next 24 hours.   Walking will help get rid of the air and reduce the bloated feeling in your belly (abdomen).   No driving for 24 hours (because of the medicine (anesthesia) used during the test).   You may shower.   Do not sign any important legal documents or operate any machinery for 24 hours (because of the anesthesia used during the test).    NUTRITION  Drink plenty of fluids.   You may resume your normal diet as instructed by your doctor.   Begin with a light meal and progress to your normal diet. Heavy or fried foods are harder to digest and may make you feel sick to your stomach (nauseated).   Avoid alcoholic beverages for 24 hours or as instructed.    MEDICATIONS  You may resume your normal  medications.   WHAT YOU CAN EXPECT TODAY  Some feelings of bloating in the abdomen.   Passage of more gas than usual.   Spotting of blood in your stool or on the toilet paper  .  IF YOU HAD POLYPS REMOVED DURING THE COLONOSCOPY:  Eat a soft diet IF YOU HAVE NAUSEA, BLOATING, ABDOMINAL PAIN, OR VOMITING.    FINDING OUT THE RESULTS OF YOUR TEST Not all test results are available during your visit. DR. Oneida Alar WILL CALL YOU WITHIN 7 DAYS OF YOUR PROCEDUE WITH YOUR RESULTS. Do not assume everything is normal if you have not heard from DR. Oracio Galen IN ONE WEEK, CALL HER OFFICE AT [phone].  SEEK IMMEDIATE MEDICAL ATTENTION AND CALL THE OFFICE: [phone] IF:  You have more than a spotting of blood in your stool.   Your belly is swollen (abdominal distention).   You are nauseated or vomiting.   You have a temperature over 101F.   You have abdominal pain or discomfort that is severe or gets worse throughout the day.  High-Fiber Diet A high-fiber diet changes your normal diet to include more whole grains, legumes, fruits, and vegetables. Changes in the diet involve replacing refined carbohydrates with unrefined foods. The calorie level of the diet is essentially unchanged. The Dietary Reference Intake (recommended amount) for adult males is 38 grams per day. For adult females, it is 25 grams per day. Pregnant and lactating women should consume 28  grams of fiber per day. Fiber is the intact part of a plant that is not broken down during digestion. Functional fiber is fiber that has been isolated from the plant to provide a beneficial effect in the body. PURPOSE  Increase stool bulk.   Ease and regulate bowel movements.   Lower cholesterol.   REDUCE RISK OF COLON CANCER  INDICATIONS THAT YOU NEED MORE FIBER  Constipation and hemorrhoids.   Uncomplicated diverticulosis (intestine condition) and irritable bowel syndrome.   Weight management.   As a protective measure  against hardening of the arteries (atherosclerosis), diabetes, and cancer.   GUIDELINES FOR INCREASING FIBER IN THE DIET  Start adding fiber to the diet slowly. A gradual increase of about 5 more grams (2 slices of whole-wheat bread, 2 servings of most fruits or vegetables, or 1 bowl of high-fiber cereal) per day is best. Too rapid an increase in fiber may result in constipation, flatulence, and bloating.   Drink enough water and fluids to keep your urine clear or pale yellow. Water, juice, or caffeine-free drinks are recommended. Not drinking enough fluid may cause constipation.   Eat a variety of high-fiber foods rather than one type of fiber.   Try to increase your intake of fiber through using high-fiber foods rather than fiber pills or supplements that contain small amounts of fiber.   The goal is to change the types of food eaten. Do not supplement your present diet with high-fiber foods, but replace foods in your present diet.   INCLUDE A VARIETY OF FIBER SOURCES  Replace refined and processed grains with whole grains, canned fruits with fresh fruits, and incorporate other fiber sources. White rice, white breads, and most bakery goods contain little or no fiber.   Brown whole-grain rice, buckwheat oats, and many fruits and vegetables are all good sources of fiber. These include: broccoli, Brussels sprouts, cabbage, cauliflower, beets, sweet potatoes, white potatoes (skin on), carrots, tomatoes, eggplant, squash, berries, fresh fruits, and dried fruits.   Cereals appear to be the richest source of fiber. Cereal fiber is found in whole grains and bran. Bran is the fiber-rich outer coat of cereal grain, which is largely removed in refining. In whole-grain cereals, the bran remains. In breakfast cereals, the largest amount of fiber is found in those with "bran" in their names. The fiber content is sometimes indicated on the label.   You may need to include additional fruits and vegetables  each day.   In baking, for 1 cup white flour, you may use the following substitutions:   1 cup whole-wheat flour minus 2 tablespoons.   1/2 cup white flour plus 1/2 cup whole-wheat flour.   Diverticulosis Diverticulosis is a common condition that develops when small pouches (diverticula) form in the wall of the colon. The risk of diverticulosis increases with age. It happens more often in people who eat a low-fiber diet. Most individuals with diverticulosis have no symptoms. Those individuals with symptoms usually experience belly (abdominal) pain, constipation, or loose stools (diarrhea).  HOME CARE INSTRUCTIONS  Increase the amount of fiber in your diet as directed by your caregiver or dietician. This may reduce symptoms of diverticulosis.   Drink at least 6 to 8 glasses of water each day to prevent constipation.   Try not to strain when you have a bowel movement.   Avoiding nuts and seeds to prevent complications is NOT NECESSARY.   FOODS HAVING HIGH FIBER CONTENT INCLUDE:  Fruits. Apple, peach, pear, tangerine, raisins, prunes.  Vegetables. Brussels sprouts, asparagus, broccoli, cabbage, carrot, cauliflower, romaine lettuce, spinach, summer squash, tomato, winter squash, zucchini.   Starchy Vegetables. Baked beans, kidney beans, lima beans, split peas, lentils, potatoes (with skin).   Grains. Whole wheat bread, brown rice, bran flake cereal, plain oatmeal, white rice, shredded wheat, bran muffins.    SEEK IMMEDIATE MEDICAL CARE IF:  You develop increasing pain or severe bloating.   You have an oral temperature above 101F.   You develop vomiting or bowel movements that are bloody or black.   Hemorrhoids Hemorrhoids are dilated (enlarged) veins around the rectum. Sometimes clots will form in the veins. This makes them swollen and painful. These are called thrombosed hemorrhoids. Causes of hemorrhoids include:  Constipation.   Straining to have a bowel movement.    HEAVY LIFTING  HOME CARE INSTRUCTIONS  Eat a well balanced diet and drink 6 to 8 glasses of water every day to avoid constipation. You may also use a bulk laxative.   Avoid straining to have bowel movements.   Keep anal area dry and clean.   Do not use a donut shaped pillow or sit on the toilet for long periods. This increases blood pooling and pain.   Move your bowels when your body has the urge; this will require less straining and will decrease pain and pressure.

## 2017-10-19 ENCOUNTER — Encounter (HOSPITAL_COMMUNITY): Payer: Self-pay | Admitting: Gastroenterology

## 2017-11-23 ENCOUNTER — Telehealth: Payer: Self-pay | Admitting: Hematology and Oncology

## 2017-11-23 ENCOUNTER — Inpatient Hospital Stay: Payer: 59 | Attending: Hematology and Oncology | Admitting: Hematology and Oncology

## 2017-11-23 DIAGNOSIS — Z9011 Acquired absence of right breast and nipple: Secondary | ICD-10-CM | POA: Insufficient documentation

## 2017-11-23 DIAGNOSIS — Z170 Estrogen receptor positive status [ER+]: Secondary | ICD-10-CM | POA: Diagnosis not present

## 2017-11-23 DIAGNOSIS — Z79811 Long term (current) use of aromatase inhibitors: Secondary | ICD-10-CM

## 2017-11-23 DIAGNOSIS — C50411 Malignant neoplasm of upper-outer quadrant of right female breast: Secondary | ICD-10-CM | POA: Insufficient documentation

## 2017-11-23 MED ORDER — ANASTROZOLE 1 MG PO TABS: 1.0000 mg | ORAL_TABLET | Freq: Every day | ORAL | 11 refills | Status: DC

## 2017-11-23 NOTE — Telephone Encounter (Signed)
Gave patient AVs and calendar of upcoming April 2020 appointments.  °

## 2017-11-23 NOTE — Assessment & Plan Note (Signed)
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 ER 99% PR 99% HER-2 negative: Intermediate risk Oncotype DX recurrence score 23. Patient received 4 cycles of Taxotere Cytoxan chemotherapy. She had breast reconstruction subsequently in May 2015, started Arimidex May 2015  Surveillance:  1. Breast exam  11/23/2017 did not reveal any lumps or nodules 2. Mammogram  06/23/2017 is normal density category B 3. Bone density April 2015 showed T score -0.6 We will need to obtain another bone density  Arimidex toxicities: 1. Hot flashes accompanied by sweating especially in hot weather 2. Fatigue 3. Weight gain: Refer her to [facility] health weight loss program Right leg and back pain: Could be arthritis versus bursitis   Low back pain: Patient has seen a chiropractor. Return to clinic 1 year for follow-up

## 2017-11-23 NOTE — Progress Notes (Signed)
Patient Care Team: Theodoro Clock as PCP - General (General Practice) Amada Kingfisher, MD (Inactive) as Consulting Physician (Hematology and Oncology) Danie Binder, MD as Consulting Physician (Gastroenterology)  DIAGNOSIS:  Encounter Diagnosis  Name Primary?  . Malignant neoplasm of upper-outer quadrant of right breast in female, estrogen receptor positive ([place])     SUMMARY OF ONCOLOGIC HISTORY:   Breast cancer of upper-outer quadrant of right female breast ([place])   04/29/2013 Mammogram    Right breast mass, 7 mm lesion at 9:00 position, IDC with DCIS grade 2 ER  PR 99% positive HER-2 negative Ki-67 11%: Second mass IDC ER/PR positive HER-2 negative Ki-67 11%, MRI 6 cm including enhancement       07/12/2013 Surgery    Right breast mastectomy: IDC grade 1, 2 foci 1.2 and 1.1 cm, LVI positive, 2 SLN negative, Oncotype DX score 23 intermediate risk, ROR. 14%      09/26/2013 - 11/28/2013 Chemotherapy    Adjuvant Taxotere and Cytoxan x4 cycles      01/14/2014 Surgery    Right breast reconstruction and right port removal      01/17/2014 -  Anti-estrogen oral therapy    anastrozole 1 mg daily       CHIEF COMPLIANT: Follow-up on anastrozole therapy  INTERVAL HISTORY: Morgan Santos is a 67 year old with above-mentioned history of right breast cancer was treated with mastectomy followed by adjuvant chemotherapy and breast reconstruction and is currently on anastrozole therapy.  She is tolerating anastrozole extremely well.  She does have intermittent hot flashes.  She has not been exercising at all.  She is not been watching her weight.  She has intermittent discomfort in the left breast.  REVIEW OF SYSTEMS:   Constitutional: Denies fevers, chills or abnormal weight loss Eyes: Denies blurriness of vision Ears, nose, mouth, throat, and face: Denies mucositis or sore throat Respiratory: Denies cough, dyspnea or wheezes Cardiovascular: Denies palpitation, chest  discomfort Gastrointestinal:  Denies nausea, heartburn or change in bowel habits Skin: Denies abnormal skin rashes Lymphatics: Denies new lymphadenopathy or easy bruising Neurological:Denies numbness, tingling or new weaknesses Behavioral/Psych: Mood is stable, no new changes  Extremities: No lower extremity edema Breast:  denies any pain or lumps or nodules in either breasts All other systems were reviewed with the patient and are negative.  I have reviewed the past medical history, past surgical history, social history and family history with the patient and they are unchanged from previous note.  ALLERGIES:  is allergic to codeine; crestor [rosuvastatin calcium]; lipitor [atorvastatin]; namenda [memantine hcl]; peanut-containing drug products; and multihance [gadobenate].  MEDICATIONS:  Current Outpatient Medications  Medication Sig Dispense Refill  . ALPRAZolam (XANAX) 0.5 MG tablet TAKE 1 TABLET BY MOUTH TWICE DAILY AS NEEDED (Patient taking differently: TAKE 1 TABLET BY MOUTH TWICE DAILY AS NEEDED FOR ANXIETY) 60 tablet 2  . anastrozole (ARIMIDEX) 1 MG tablet Take 1 tablet (1 mg total) by mouth daily. 30 tablet 11  . ARTIFICIAL TEAR OP Place 1 drop into both eyes daily as needed (dry eyes).    Marland Kitchen aspirin EC 81 MG tablet Take 81 mg by mouth every other day.    . budesonide-formoterol (SYMBICORT) 160-4.5 MCG/ACT inhaler Inhale 1 puff into the lungs 2 (two) times daily. (Patient taking differently: Inhale 1 puff into the lungs 2 (two) times daily as needed (shortness of breath). ) 1 Inhaler 3  . Calcium-Magnesium-Zinc 500-250-12.5 MG TABS Take 1 tablet by mouth daily.     Marland Kitchen  Cholecalciferol (VITAMIN D-3) 5000 UNITS TABS Take 10,000 Units by mouth daily.     . citalopram (CELEXA) 40 MG tablet Take 1 tablet (40 mg total) by mouth daily. 90 tablet 3  . Cyanocobalamin (B-12) 2500 MCG SUBL Place 2,500 mcg under the tongue daily.    Marland Kitchen donepezil (ARICEPT) 10 MG tablet Take 10 mg by mouth daily.     . meloxicam (MOBIC) 7.5 MG tablet TAKE 1 TABLET BY MOUTH ONCE DAILY 30 tablet 5  . Na Sulfate-K Sulfate-Mg Sulf 17.5-3.13-1.6 GM/177ML SOLN Take 1 kit by mouth as directed. 1 Bottle 0  . omeprazole (PRILOSEC) 20 MG capsule 1 PO 30 MINS PRIOR TO BREAKFAST. 90 capsule 3  . pravastatin (PRAVACHOL) 40 MG tablet TAKE 1 TABLET BY MOUTH ONCE DAILY 30 tablet 11  . traZODone (DESYREL) 50 MG tablet Take 50 mg by mouth at bedtime as needed for sleep.      No current facility-administered medications for this visit.     PHYSICAL EXAMINATION: ECOG PERFORMANCE STATUS: 1 - Symptomatic but completely ambulatory  Vitals:   11/23/17 1105  BP: (!) 115/51  Pulse: 63  Resp: 18  Temp: 98.2 F (36.8 C)  SpO2: 100%   Filed Weights   11/23/17 1105  Weight: 244 lb 3.2 oz (110.8 kg)    GENERAL:alert, no distress and comfortable SKIN: skin color, texture, turgor are normal, no rashes or significant lesions EYES: normal, Conjunctiva are pink and non-injected, sclera clear OROPHARYNX:no exudate, no erythema and lips, buccal mucosa, and tongue normal  NECK: supple, thyroid normal size, non-tender, without nodularity LYMPH:  no palpable lymphadenopathy in the cervical, axillary or inguinal LUNGS: clear to auscultation and percussion with normal breathing effort HEART: regular rate & rhythm and no murmurs and no lower extremity edema ABDOMEN:abdomen soft, non-tender and normal bowel sounds MUSCULOSKELETAL:no cyanosis of digits and no clubbing  NEURO: alert & oriented x 3 with fluent speech, no focal motor/sensory deficits EXTREMITIES: No lower extremity edema BREAST: No palpable masses or nodules in either right or left breasts.  Right breast reconstructed.  No palpable axillary supraclavicular or infraclavicular adenopathy no breast tenderness or nipple discharge. (exam performed in the presence of a chaperone)  LABORATORY DATA:  I have reviewed the data as listed CMP Latest Ref Rng & Units 07/26/2017  09/21/2016 06/07/2016  Glucose 65 - 99 mg/dL 84 97 88  BUN 8 - 27 mg/dL _0 Creatinine 0.57 - 1.00 mg/dL 0.97 0.73 0.66  Sodium 134 - 144 mmol/L 145(H) 141 141  Potassium 3.5 - 5.2 mmol/L 4.3 4.2 4.3  Chloride 96 - 106 mmol/L 105 101 101  CO2 20 - 29 mmol/L _1 Calcium 8.7 - 10.3 mg/dL 9.4 9.2 9.4  Total Protein 6.0 - 8.5 g/dL 6.7 6.7 6.6  Total Bilirubin 0.0 - 1.2 mg/dL 0.4 0.7 0.6  Alkaline Phos 39 - 117 IU/L 129(H) 145(H) 156(H)  AST 0 - 40 IU/L _2 ALT 0 - 32 IU/L _3 Lab Results  Component Value Date   WBC 5.9 07/26/2017   HGB 13.0 07/26/2017   HCT 40.5 07/26/2017   MCV 92 07/26/2017   PLT 237 07/26/2017   NEUTROABS 2.6 07/26/2017    ASSESSMENT & PLAN:  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 ER 99% PR 99% HER-2 negative: Intermediate risk Oncotype  DX recurrence score 23. Patient received 4 cycles of Taxotere Cytoxan chemotherapy. She had breast reconstruction subsequently in May 2015, started Arimidex May 2015  Surveillance:  1. Breast exam  11/23/2017 did not reveal any lumps or nodules 2. Mammogram  06/23/2017 is normal density category B 3. Bone density April 2015 showed T score -0.6 We will need to obtain another bone density  Arimidex toxicities: 1. Hot flashes accompanied by sweating especially in hot weather 2. Fatigue 3. Weight gain: Refer her to [facility] health weight loss program Right leg and back pain: Could be arthritis versus bursitis   Low back pain: Patient has seen a chiropractor. Return to clinic 1 year for follow-up      No orders of the defined types were placed in this encounter.  The patient has a good understanding of the overall plan. she agrees with it. she will call with any problems that may develop before the next visit here.   Harriette Ohara, MD 11/23/17

## 2017-12-06 ENCOUNTER — Ambulatory Visit: Payer: Medicare Other | Admitting: Hematology and Oncology

## 2017-12-07 ENCOUNTER — Ambulatory Visit: Payer: 59 | Admitting: Hematology and Oncology

## 2017-12-27 ENCOUNTER — Encounter: Payer: Self-pay | Admitting: Physician Assistant

## 2017-12-27 ENCOUNTER — Ambulatory Visit (INDEPENDENT_AMBULATORY_CARE_PROVIDER_SITE_OTHER): Payer: 59 | Admitting: Physician Assistant

## 2017-12-27 VITALS — BP 129/83 | HR 64 | Ht 69.5 in | Wt 246.8 lb

## 2017-12-27 DIAGNOSIS — Z01419 Encounter for gynecological examination (general) (routine) without abnormal findings: Secondary | ICD-10-CM

## 2017-12-27 DIAGNOSIS — Z0000 Encounter for general adult medical examination without abnormal findings: Secondary | ICD-10-CM | POA: Diagnosis not present

## 2017-12-27 DIAGNOSIS — F331 Major depressive disorder, recurrent, moderate: Secondary | ICD-10-CM

## 2017-12-27 DIAGNOSIS — E785 Hyperlipidemia, unspecified: Secondary | ICD-10-CM

## 2017-12-27 DIAGNOSIS — E782 Mixed hyperlipidemia: Secondary | ICD-10-CM

## 2017-12-27 LAB — LIPID PANEL
CHOLESTEROL TOTAL: 244 mg/dL — AB (ref 100–199)
Chol/HDL Ratio: 4.4 ratio (ref 0.0–4.4)
HDL: 55 mg/dL (ref >39)
LDL Calculated: 165 mg/dL — ABNORMAL HIGH (ref 0–99)
TRIGLYCERIDES: 120 mg/dL (ref 0–149)
VLDL Cholesterol Cal: 24 mg/dL (ref 5–40)

## 2017-12-27 LAB — CBC WITH DIFFERENTIAL/PLATELET
BASOS ABS: 0 10*3/uL (ref 0.0–0.2)
Basos: 1 %
EOS (ABSOLUTE): 0.1 10*3/uL (ref 0.0–0.4)
Eos: 2 %
HEMATOCRIT: 39.3 % (ref 34.0–46.6)
HEMOGLOBIN: 13 g/dL (ref 11.1–15.9)
Immature Grans (Abs): 0 10*3/uL (ref 0.0–0.1)
Immature Granulocytes: 0 %
LYMPHS ABS: 2 10*3/uL (ref 0.7–3.1)
Lymphs: 44 %
MCH: 29 pg (ref 26.6–33.0)
MCHC: 33.1 g/dL (ref 31.5–35.7)
MCV: 88 fL (ref 79–97)
MONOCYTES: 7 %
MONOS ABS: 0.3 10*3/uL (ref 0.1–0.9)
NEUTROS ABS: 2 10*3/uL (ref 1.4–7.0)
Neutrophils: 46 %
Platelets: 215 10*3/uL (ref 150–379)
RBC: 4.49 x10E6/uL (ref 3.77–5.28)
RDW: 13.6 % (ref 12.3–15.4)
WBC: 4.5 10*3/uL (ref 3.4–10.8)

## 2017-12-27 LAB — CMP14+EGFR
ALBUMIN: 4.2 g/dL (ref 3.6–4.8)
ALK PHOS: 140 IU/L — AB (ref 39–117)
ALT: 19 IU/L (ref 0–32)
AST: 22 IU/L (ref 0–40)
Albumin/Globulin Ratio: 2 (ref 1.2–2.2)
BILIRUBIN TOTAL: 0.6 mg/dL (ref 0.0–1.2)
BUN / CREAT RATIO: 14 (ref 12–28)
BUN: 10 mg/dL (ref 8–27)
CHLORIDE: 104 mmol/L (ref 96–106)
CO2: 23 mmol/L (ref 20–29)
Calcium: 9.3 mg/dL (ref 8.7–10.3)
Creatinine, Ser: 0.74 mg/dL (ref 0.57–1.00)
GFR calc Af Amer: 97 mL/min/{1.73_m2} (ref >59)
GFR calc non Af Amer: 84 mL/min/{1.73_m2} (ref >59)
GLOBULIN, TOTAL: 2.1 g/dL (ref 1.5–4.5)
GLUCOSE: 90 mg/dL (ref 65–99)
Potassium: 4.1 mmol/L (ref 3.5–5.2)
SODIUM: 141 mmol/L (ref 134–144)
Total Protein: 6.3 g/dL (ref 6.0–8.5)

## 2017-12-27 MED ORDER — PRAVASTATIN SODIUM 40 MG PO TABS: 40.0000 mg | ORAL_TABLET | Freq: Every day | ORAL | 11 refills | Status: DC

## 2017-12-27 MED ORDER — OMEPRAZOLE 20 MG PO CPDR: DELAYED_RELEASE_CAPSULE | ORAL | 11 refills | Status: DC

## 2017-12-27 MED ORDER — CITALOPRAM HYDROBROMIDE 40 MG PO TABS: 40.0000 mg | ORAL_TABLET | Freq: Every day | ORAL | 3 refills | Status: DC

## 2017-12-27 MED ORDER — MELOXICAM 7.5 MG PO TABS: 7.5000 mg | ORAL_TABLET | Freq: Every day | ORAL | 11 refills | Status: DC

## 2017-12-27 NOTE — Progress Notes (Signed)
BP 129/83   Pulse 64   Ht 5' 9.5" (1.765 m)   Wt 246 lb 12.8 oz (111.9 kg)   BMI 35.92 kg/m    Subjective:    Patient ID: Morgan Santos, female    DOB: 1951/03/31, 67 y.o.   MRN: [id number]  HPI: Morgan Santos is a 67 y.o. female presenting on 12/27/2017 for Annual Exam  This patient comes in for annual well physical examination. All medications are reviewed today. There are no reports of any problems with the medications. All of the medical conditions are reviewed and updated.  Lab work is reviewed and will be ordered as medically necessary. There are no new problems reported with today's visit.  Patient reports doing well overall.  Still with oncology for breast cancer, no changes in remission status.  Past Medical History:  Diagnosis Date  . Anemia    when she was younger  . Anxiety   . Arthritis    hip- R, back; osteoarthritis  . Asthma   . Breast cancer ([place])   . Complication of anesthesia   . Constipation   . Depression    pt. remarks that she "takes respridal because if I don't take it I get angry"   . GERD (gastroesophageal reflux disease)    pt. uses vinegar for heartburn, ususally once per week   . H/O degenerative disc disease   . Hyperlipidemia   . Hypertension    "when I get upset"  Not on medication  . PONV (postoperative nausea and vomiting)    "just for one day"  . Stroke [facility]) ?2010 or 2011   2010, [facility] for stroke "mini stroke" short term memory problems since   Relevant past medical, surgical, family and social history reviewed and updated as indicated. Interim medical history since our last visit reviewed. Allergies and medications reviewed and updated. DATA REVIEWED: CHART IN EPIC  Family History reviewed for pertinent findings.  Review of Systems  Constitutional: Negative.  Negative for activity change, fatigue and fever.  HENT: Negative.   Eyes: Negative.   Respiratory: Negative.  Negative for cough.   Cardiovascular: Negative.   Negative for chest pain.  Gastrointestinal: Negative.  Negative for abdominal pain.  Endocrine: Negative.   Genitourinary: Negative.  Negative for dysuria.  Musculoskeletal: Positive for arthralgias, back pain and myalgias.  Skin: Negative.   Neurological: Negative.     Allergies as of 12/27/2017      Reactions   Codeine Nausea And Vomiting   Crestor [rosuvastatin Calcium]    Muscle pain   Lipitor [atorvastatin]    Muscle pain   Namenda [memantine Hcl]    Nightmares    Peanut-containing Drug Products    Peanut butter triggers asthma    Multihance [gadobenate] Nausea Only, Cough   PT HAD INCREASED MUCOUS PRODUCTION AND PHLEGMY COUGH LASTING 10 MINS AFTER INJECTION, PT SENT HOME WITH BENADRYL, PREV NAUSEA AFTER GAD      Medication List        Accurate as of 12/27/17  2:24 PM. Always use your most recent med list.          ALPRAZolam 0.5 MG tablet Commonly known as:  XANAX TAKE 1 TABLET BY MOUTH TWICE DAILY AS NEEDED   anastrozole 1 MG tablet Commonly known as:  ARIMIDEX Take 1 tablet (1 mg total) by mouth daily.   ARTIFICIAL TEAR OP Place 1 drop into both eyes daily as needed (dry eyes).   aspirin EC 81 MG tablet  Take 81 mg by mouth every other day.   B-12 2500 MCG Subl Place 2,500 mcg under the tongue daily.   budesonide-formoterol 160-4.5 MCG/ACT inhaler Commonly known as:  SYMBICORT Inhale 1 puff into the lungs 2 (two) times daily.   Calcium-Magnesium-Zinc 500-250-12.5 MG Tabs Take 1 tablet by mouth daily.   citalopram 40 MG tablet Commonly known as:  CELEXA Take 1 tablet (40 mg total) by mouth daily.   donepezil 10 MG tablet Commonly known as:  ARICEPT Take 10 mg by mouth daily.   meloxicam 7.5 MG tablet Commonly known as:  MOBIC Take 1 tablet (7.5 mg total) by mouth daily.   omeprazole 20 MG capsule Commonly known as:  PRILOSEC 1 PO 30 MINS PRIOR TO BREAKFAST.   pravastatin 40 MG tablet Commonly known as:  PRAVACHOL Take 1 tablet (40 mg total)  by mouth daily.   traZODone 50 MG tablet Commonly known as:  DESYREL Take 50 mg by mouth at bedtime as needed for sleep.   Vitamin D-3 5000 units Tabs Take 10,000 Units by mouth daily.          Objective:    BP 129/83   Pulse 64   Ht 5' 9.5" (1.765 m)   Wt 246 lb 12.8 oz (111.9 kg)   BMI 35.92 kg/m   Allergies  Allergen Reactions  . Codeine Nausea And Vomiting  . Crestor [Rosuvastatin Calcium]     Muscle pain  . Lipitor [Atorvastatin]     Muscle pain  . Namenda [Memantine Hcl]     Nightmares   . Peanut-Containing Drug Products     Peanut butter triggers asthma   . Multihance [Gadobenate] Nausea Only and Cough    PT HAD INCREASED MUCOUS PRODUCTION AND PHLEGMY COUGH LASTING 10 MINS AFTER INJECTION, PT SENT HOME WITH BENADRYL, PREV NAUSEA AFTER GAD    Wt Readings from Last 3 Encounters:  12/27/17 246 lb 12.8 oz (111.9 kg)  11/23/17 244 lb 3.2 oz (110.8 kg)  10/10/17 242 lb (109.8 kg)    Physical Exam  Constitutional: She is oriented to person, place, and time. She appears well-developed and well-nourished.  HENT:  Head: Normocephalic and atraumatic.  Eyes: Pupils are equal, round, and reactive to light. Conjunctivae and EOM are normal.  Neck: Normal range of motion. Neck supple.  Cardiovascular: Normal rate, regular rhythm, normal heart sounds and intact distal pulses.  Pulmonary/Chest: Effort normal and breath sounds normal. Right breast exhibits no mass. Left breast exhibits no mass, no skin change and no tenderness. No breast tenderness, discharge or bleeding. Breasts are symmetrical.  Implant present in the right breast, no nipple, well-healed scar    Abdominal: Soft. Bowel sounds are normal.  Genitourinary: Vagina normal and uterus normal. Rectal exam shows no fissure. No breast tenderness, discharge or bleeding. There is no tenderness or lesion on the right labia. There is no tenderness or lesion on the left labia. Uterus is not deviated, not enlarged and not  tender. Cervix exhibits no motion tenderness, no discharge and no friability. Right adnexum displays no mass, no tenderness and no fullness. Left adnexum displays no mass, no tenderness and no fullness. No tenderness or bleeding in the vagina. No vaginal discharge found.  Neurological: She is alert and oriented to person, place, and time. She has normal reflexes.  Skin: Skin is warm and dry. No rash noted.  Psychiatric: She has a normal mood and affect. Her behavior is normal. Judgment and thought content normal.    Results  for orders placed or performed in visit on 07/26/17  CBC with Differential/Platelet  Result Value Ref Range   WBC 5.9 3.4 - 10.8 x10E3/uL   RBC 4.42 3.77 - 5.28 x10E6/uL   Hemoglobin 13.0 11.1 - 15.9 g/dL   Hematocrit 40.5 34.0 - 46.6 %   MCV 92 79 - 97 fL   MCH 29.4 26.6 - 33.0 pg   MCHC 32.1 31.5 - 35.7 g/dL   RDW 12.9 12.3 - 15.4 %   Platelets 237 150 - 379 x10E3/uL   Neutrophils 44 Not Estab. %   Lymphs 43 Not Estab. %   Monocytes 9 Not Estab. %   Eos 3 Not Estab. %   Basos 1 Not Estab. %   Neutrophils Absolute 2.6 1.4 - 7.0 x10E3/uL   Lymphocytes Absolute 2.6 0.7 - 3.1 x10E3/uL   Monocytes Absolute 0.5 0.1 - 0.9 x10E3/uL   EOS (ABSOLUTE) 0.2 0.0 - 0.4 x10E3/uL   Basophils Absolute 0.0 0.0 - 0.2 x10E3/uL   Immature Granulocytes 0 Not Estab. %   Immature Grans (Abs) 0.0 0.0 - 0.1 x10E3/uL  TSH  Result Value Ref Range   TSH 1.320 0.450 - 4.500 uIU/mL  CMP14+EGFR  Result Value Ref Range   Glucose 84 65 - 99 mg/dL   BUN 13 8 - 27 mg/dL   Creatinine, Ser 0.97 0.57 - 1.00 mg/dL   GFR calc non Af Amer 61 >59 mL/min/1.73   GFR calc Af Amer 70 >59 mL/min/1.73   BUN/Creatinine Ratio 13 12 - 28   Sodium 145 (H) 134 - 144 mmol/L   Potassium 4.3 3.5 - 5.2 mmol/L   Chloride 105 96 - 106 mmol/L   CO2 28 20 - 29 mmol/L   Calcium 9.4 8.7 - 10.3 mg/dL   Total Protein 6.7 6.0 - 8.5 g/dL   Albumin 4.0 3.6 - 4.8 g/dL   Globulin, Total 2.7 1.5 - 4.5 g/dL    Albumin/Globulin Ratio 1.5 1.2 - 2.2   Bilirubin Total 0.4 0.0 - 1.2 mg/dL   Alkaline Phosphatase 129 (H) 39 - 117 IU/L   AST 20 0 - 40 IU/L   ALT 17 0 - 32 IU/L      Assessment & Plan:   1. Well female exam with routine gynecological exam - CBC with Differential/Platelet - CMP14+EGFR - Lipid panel  2. Moderate episode of recurrent major depressive disorder (HCC) - citalopram (CELEXA) 40 MG tablet; Take 1 tablet (40 mg total) by mouth daily.  Dispense: 90 tablet; Refill: 3  3. Hyperlipidemia, unspecified hyperlipidemia type - pravastatin (PRAVACHOL) 40 MG tablet; Take 1 tablet (40 mg total) by mouth daily.  Dispense: 30 tablet; Refill: 11  4. Mixed hyperlipidemia - Lipid panel   Continue all other maintenance medications as listed above.  Follow up plan: Return in about 6 months (around 06/29/2018).  Educational handout given for [place] PA-C [facility] [address]  [place], [place] [address] [phone]   12/27/2017, 2:24 PM

## 2017-12-29 ENCOUNTER — Other Ambulatory Visit: Payer: Self-pay | Admitting: Physician Assistant

## 2017-12-29 DIAGNOSIS — E785 Hyperlipidemia, unspecified: Secondary | ICD-10-CM

## 2017-12-29 MED ORDER — PRAVASTATIN SODIUM 80 MG PO TABS: 80.0000 mg | ORAL_TABLET | Freq: Every day | ORAL | 3 refills | Status: DC

## 2018-04-03 ENCOUNTER — Other Ambulatory Visit: Payer: Self-pay | Admitting: Physician Assistant

## 2018-05-31 ENCOUNTER — Ambulatory Visit: Payer: 59 | Admitting: Neurology

## 2018-07-05 ENCOUNTER — Telehealth: Payer: Self-pay | Admitting: Neurology

## 2018-07-05 ENCOUNTER — Other Ambulatory Visit: Payer: Self-pay | Admitting: Physician Assistant

## 2018-07-05 ENCOUNTER — Ambulatory Visit (INDEPENDENT_AMBULATORY_CARE_PROVIDER_SITE_OTHER): Payer: 59 | Admitting: Neurology

## 2018-07-05 ENCOUNTER — Encounter: Payer: Self-pay | Admitting: Neurology

## 2018-07-05 VITALS — BP 136/84 | HR 59 | Ht 69.5 in | Wt 247.0 lb

## 2018-07-05 DIAGNOSIS — Z1231 Encounter for screening mammogram for malignant neoplasm of breast: Secondary | ICD-10-CM

## 2018-07-05 DIAGNOSIS — R9089 Other abnormal findings on diagnostic imaging of central nervous system: Secondary | ICD-10-CM

## 2018-07-05 DIAGNOSIS — F5101 Primary insomnia: Secondary | ICD-10-CM

## 2018-07-05 DIAGNOSIS — R413 Other amnesia: Secondary | ICD-10-CM | POA: Diagnosis not present

## 2018-07-05 MED ORDER — TRAZODONE HCL 100 MG PO TABS: 100.0000 mg | ORAL_TABLET | Freq: Every day | ORAL | 11 refills | Status: DC
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
## 2018-07-05 NOTE — Telephone Encounter (Signed)
lvm for pt to be aware I gave her GI phone number of [phone] and to call them if she has not heard in the next 2-3 business days.

## 2018-07-05 NOTE — Telephone Encounter (Signed)
Medicare/[employer] order sent to GI. They will reach out to the pt to schedule.  °

## 2018-07-09 ENCOUNTER — Other Ambulatory Visit: Payer: Self-pay | Admitting: Physician Assistant

## 2018-07-27 ENCOUNTER — Ambulatory Visit
Admission: RE | Admit: 2018-07-27 | Discharge: 2018-07-27 | Disposition: A | Discharge status: Home / Self Care | Payer: Medicare Other | Source: Ambulatory Visit | Attending: Neurology | Admitting: Neurology

## 2018-07-27 DIAGNOSIS — R413 Other amnesia: Secondary | ICD-10-CM | POA: Diagnosis not present

## 2018-07-30 ENCOUNTER — Telehealth: Payer: Self-pay | Admitting: Neurology

## 2018-07-30 DIAGNOSIS — R9089 Other abnormal findings on diagnostic imaging of central nervous system: Secondary | ICD-10-CM

## 2018-07-30 NOTE — Telephone Encounter (Signed)
Left vm for patient to call back about her MRI results. Rn also left vm about other test Dr Krista Blue recommend.

## 2018-07-30 NOTE — Telephone Encounter (Signed)
Please call patient, MRI of brain showed significant abnormality, extensive confluent periventricular, brainstem and subcortical white matter small vessel disease, significantly progressed since 2011,  I have ordered echocardiogram, ultrasound of carotid artery for further evaluation, she should keep aspirin 81 mg daily, increase water intake     IMPRESSION:  Abnormal MRI scan of the brain showing extensive confluent periventricular , brainstem and subcortical white matter hyperintensities which are likely from age advanced changes of chronic small vessel disease. These appear significantly advanced compared with previous MRI from 10/12/2009. There are no acute abnormalities noted.

## 2018-07-31 NOTE — Telephone Encounter (Signed)
Rn call patient about her MRI brain. Rn gave her the results. PT verbalized understanding. Rn stated Dr. Krista Blue order a echo, and carotid ultrasound. PT stated she has receive a call and is schedule for both test on 08/16/2018. Pt verbalized understanding.

## 2018-08-13 NOTE — Telephone Encounter (Signed)
Pt requesting a call stating she has a few questions regarding upcoming test and how to get in touch with the doctor/ tech who will be doing them. Please advise.

## 2018-08-13 NOTE — Telephone Encounter (Signed)
The patient is scheduled at [facility] for her ECHO and Carotid Ultrasound on 08/16/18.  She is unsure where to check in.  I called [facility] at [phone].  The patient was instructed to go to Admissions and Registration at 12:45pm and they will walk her to the appropriate departement. The patient is aware of this information and wrote it down so she does not get confused.

## 2018-08-16 ENCOUNTER — Ambulatory Visit (HOSPITAL_BASED_OUTPATIENT_CLINIC_OR_DEPARTMENT_OTHER)
Admission: RE | Admit: 2018-08-16 | Discharge: 2018-08-16 | Disposition: A | Discharge status: Home / Self Care | Payer: 59 | Source: Ambulatory Visit | Attending: Neurology | Admitting: Neurology

## 2018-08-16 ENCOUNTER — Telehealth: Payer: Self-pay | Admitting: Neurology

## 2018-08-16 ENCOUNTER — Ambulatory Visit (HOSPITAL_COMMUNITY)
Admission: RE | Admit: 2018-08-16 | Discharge: 2018-08-16 | Disposition: A | Discharge status: Home / Self Care | Payer: 59 | Source: Ambulatory Visit | Attending: Neurology | Admitting: Neurology

## 2018-08-16 ENCOUNTER — Telehealth: Payer: Self-pay | Admitting: *Deleted

## 2018-08-16 DIAGNOSIS — R9089 Other abnormal findings on diagnostic imaging of central nervous system: Secondary | ICD-10-CM

## 2018-08-16 DIAGNOSIS — Z8673 Personal history of transient ischemic attack (TIA), and cerebral infarction without residual deficits: Secondary | ICD-10-CM | POA: Diagnosis not present

## 2018-08-16 DIAGNOSIS — I6523 Occlusion and stenosis of bilateral carotid arteries: Secondary | ICD-10-CM | POA: Insufficient documentation

## 2018-08-16 NOTE — Telephone Encounter (Signed)
Please call patient ultrasound of carotid arteries showed less than 39% stenosis of bilateral internal carotid artery, anterograde flow of bilateral vertebral artery,  Keep aspirin 81 mg daily  Summary: Right Carotid: Velocities in the right ICA are consistent with a 1-39% stenosis.  Left Carotid: Velocities in the left ICA are consistent with a 1-39% stenosis.  Vertebrals:  Bilateral vertebral arteries demonstrate antegrade flow. Subclavians: Normal flow hemodynamics were seen in bilateral subclavian              arteries.  *See table(s) above for measurements and observations.

## 2018-08-16 NOTE — Telephone Encounter (Signed)
Please call patient, Less than 39% stenosis of bilateral ICA, bilateral vertebral artery is anterograde flow, continue ASA daily.   Summary: Right Carotid: Velocities in the right ICA are consistent with a 1-39% stenosis.  Left Carotid: Velocities in the left ICA are consistent with a 1-39% stenosis.  Vertebrals:  Bilateral vertebral arteries demonstrate antegrade flow. Subclavians: Normal flow hemodynamics were seen in bilateral subclavian              arteries.

## 2018-08-16 NOTE — Telephone Encounter (Signed)
Spoke to the patient - she is aware of the results.

## 2018-08-16 NOTE — Telephone Encounter (Signed)
Spoke to patient to notify her of results.  She verbalized understanding and will continue taking aspirin 81mg  daily.

## 2018-08-16 NOTE — Progress Notes (Signed)
Carotid artery duplex completed. Refer to "CV Proc" under chart review to view preliminary results.  08/16/2018 2:34 PM Maudry Mayhew, MHA, RVT, RDCS, RDMS

## 2018-08-16 NOTE — Progress Notes (Signed)
  Echocardiogram 2D Echocardiogram has been performed.  Johny Chess 08/16/2018, 1:42 PM

## 2018-08-16 NOTE — Telephone Encounter (Signed)
-----   Message from Marcial Pacas, MD sent at 08/16/2018  6:16 PM EST ----- Please call patient for no significant abnormality on echocardiogram

## 2018-08-16 NOTE — Telephone Encounter (Signed)
Spoke to patient - she is aware of her results and will continue taking her daily aspirin 81mg .

## 2018-09-17 ENCOUNTER — Other Ambulatory Visit: Payer: Self-pay | Admitting: Physician Assistant

## 2018-09-20 ENCOUNTER — Ambulatory Visit
Admission: RE | Admit: 2018-09-20 | Discharge: 2018-09-20 | Disposition: A | Discharge status: Home / Self Care | Payer: 59 | Source: Ambulatory Visit | Attending: Physician Assistant | Admitting: Physician Assistant

## 2018-09-20 DIAGNOSIS — Z1231 Encounter for screening mammogram for malignant neoplasm of breast: Secondary | ICD-10-CM

## 2018-09-25 ENCOUNTER — Ambulatory Visit (INDEPENDENT_AMBULATORY_CARE_PROVIDER_SITE_OTHER): Payer: 59 | Admitting: Neurology

## 2018-09-25 ENCOUNTER — Encounter: Payer: Self-pay | Admitting: Neurology

## 2018-09-25 VITALS — BP 142/74 | HR 64 | Ht 67.0 in | Wt 246.0 lb

## 2018-09-25 DIAGNOSIS — R413 Other amnesia: Secondary | ICD-10-CM | POA: Diagnosis not present

## 2018-09-25 DIAGNOSIS — F331 Major depressive disorder, recurrent, moderate: Secondary | ICD-10-CM

## 2018-09-25 NOTE — Progress Notes (Signed)
PATIENT: Morgan Santos DOB: [birth year]  Chief Complaint  Patient presents with  . Memory Loss    She is here to follow up for test results (MRI brain, ECHO, Carotid US).  Last MMSE on 07/05/18 was 30/30.  She has continued taking aspirin 81mg  daily.   . Insomnia    She is using trazodone 100mg  at bedtime, when needed for insomina.  Feels the higher dosage is working well.     HISTORICAL  Morgan Santos is a 68 year old female, seen in request by her primary care PA Particia Nearing S for evaluation of memory loss, initial evaluation was on July 05, 2018.  She was under the care of previous neurologist Dr. Norwood Levo.  I have reviewed and summarized the referring note from the referring physician.  Reviewed her most recent office visit with Dr. Norwood Levo on January 25, 2018  She retired from [employer] job at age 52, because gradual onset memory loss, difficulty handling her job, she denies a family history of any loss, mother died of stroke at age 53, she does not know her father, she now lives with her husband still drives, she took Aricept, [place], complains of nightmares, fighting out of her 78s, she has stopped the [place] without improvement, complains of difficulty sleeping  She also reported history of strokelike event in 2003, 2005, presented with word finding difficulties, difficulty walking, Per record MRI of brain on Aug 14 2015. Patchy T2/FLAIR hyperintensity within the pons with additional patchy and confluent hyperintensity in the periventricular and deep white matter. Several small subcortical T2/FLAIR hyperintensities are also noted. No evidence of acute ischemia. No mass effect, hemorrhage, or hydrocephalus. Grossly normal flow-related signal in the major intracranial arteries and dural sinuses.  Laboratory evaluation showed normal TSH, B12 level was 198, was corrected to 747 with oral supplement  Per record, she also had had a neuropsychological testing by  Dr.Catherine Clodfelter on August 17, 2015, patient is currently functionally in the moderately impaired range for all intellectual ability, her verbal reasoning skills are low average, while her verbal reasoning skills moderately impaired, lower than those predicted by her academic and vocational history, suggestive of global intellectual decline.  She has showed weakness of the test of sustained attention, deficit in her memory both auditory verbal, and visual information, immediate recall after delay, there was no evidence of suboptimal effort, she showed profound deficit in confrontational naming,, visual spatial, constructional skills, profoundly impaired on executive function, was never able to achieve even 1 matching pattern on the cart sorting test.    In addition the tests also indicate severe anxiety depression, concern about her perception of deteriorating health, low self-esteem, and suicidal thoughts.  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  Ultrasound of carotid arteries showed less than 39% stenosis of bilateral internal carotid artery, anterograde flow of bilateral vertebral artery, echocardiogram showed no significant abnormality  I personally reviewed MRI of the brain in December 2019, evidence of extensive confluent periventricular brainstem, subcortical white matter hypodensity, much progressed compared to previous MRI in 2011,   REVIEW OF SYSTEMS: Full 14 system review of systems performed and notable only for as  above All other review of systems were negative.  ALLERGIES: Allergies  Allergen Reactions  .  Codeine Nausea And Vomiting  . Crestor [Rosuvastatin Calcium]     Muscle pain  . Lipitor [Atorvastatin]     Muscle pain  . Namenda [Memantine Hcl]     Nightmares   . Peanut-Containing Drug Products     Peanut butter triggers asthma   . Multihance [Gadobenate] Nausea Only and Cough    PT HAD INCREASED MUCOUS PRODUCTION AND PHLEGMY COUGH LASTING 10 MINS AFTER INJECTION, PT SENT HOME WITH BENADRYL, PREV NAUSEA AFTER GAD    HOME MEDICATIONS: Current Outpatient Medications  Medication Sig Dispense Refill  . ALPRAZolam (XANAX) 0.5 MG tablet TAKE 1 TABLET BY MOUTH TWICE DAILY AS NEEDED (Patient taking differently: TAKE 1 TABLET BY MOUTH TWICE DAILY AS NEEDED FOR ANXIETY) 60 tablet 2  . anastrozole (ARIMIDEX) 1 MG tablet Take 1 tablet (1 mg total) by mouth daily. 30 tablet 11  . ARTIFICIAL TEAR OP Place 1 drop into both eyes daily as needed (dry eyes).    Marland Kitchen aspirin EC 81 MG tablet Take 81 mg by mouth every other day.    . budesonide-formoterol (SYMBICORT) 160-4.5 MCG/ACT inhaler Inhale 1 puff into the lungs 2 (two) times daily. (Patient taking differently: Inhale 1 puff into the lungs 2 (two) times daily as needed (shortness of breath). ) 1 Inhaler 3  . Calcium-Magnesium-Zinc 500-250-12.5 MG TABS Take 1 tablet by mouth daily.     . Cholecalciferol (VITAMIN D-3) 5000 UNITS TABS Take 10,000 Units by mouth daily.     . citalopram (CELEXA) 40 MG tablet Take 1 tablet (40 mg total) by mouth daily. 90 tablet 3  . Cyanocobalamin (B-12) 2500 MCG SUBL Place 2,500 mcg under the tongue daily.    . meloxicam (MOBIC) 7.5 MG tablet Take 1 tablet (7.5 mg total) by mouth daily. 30 tablet 11  . pravastatin (PRAVACHOL) 80 MG tablet Take 1 tablet (80 mg total) by mouth daily. 90 tablet 3  . traZODone (DESYREL) 100 MG tablet Take 1 tablet (100 mg total) by mouth at bedtime. 30 tablet 11   No current facility-administered medications for this visit.     PAST MEDICAL HISTORY: Past Medical History:  Diagnosis Date   . Anemia    when she was younger  . Anxiety   . Arthritis    hip- R, back; osteoarthritis  . Asthma   . Breast cancer ([facility])    right  . Complication of anesthesia   . Constipation   . Depression    pt. remarks that she "takes respridal because if I don't take it I get angry"   . GERD (gastroesophageal reflux disease)    pt. uses vinegar for heartburn, ususally once per week   . H/O degenerative disc disease   . Hyperlipidemia   . Hypertension    "when I get upset"  Not on medication  . Memory loss   . PONV (postoperative nausea and vomiting)    "just for one day"  . Stroke [facility]) ?2010 or 2011   2010, [facility] for stroke "mini stroke" short term memory problems since    PAST SURGICAL HISTORY: Past Surgical History:  Procedure Laterality Date  . BREAST RECONSTRUCTION WITH PLACEMENT OF TISSUE EXPANDER AND FLEX HD (ACELLULAR HYDRATED DERMIS) Right 07/12/2013   Procedure: RIGHT BREAST RECONSTRUCTION WITH PLACEMENT OF TISSUE EXPANDER AND FLEX HD TO RIGHT BREAST (ACELLULAR HYDRATED DERMIS);  Surgeon: Irene Limbo, MD;  Location: [place];  Service: Clinical cytogeneticist;  Laterality: Right;  . BREAST SURGERY     for abcesses- removed from both breasts, many yrs. ago  . CHOLECYSTECTOMY    . COLONOSCOPY   01/29/2004   NUR:A 7-8 mm polyp snared from the hepatic flexure.  Another 3 mm polyp was     cold snared from the sigmoid colon.External hemorrhoids, possible source of recent rectal bleeding  . COLONOSCOPY N/A 06/05/2014   Procedure: COLONOSCOPY;  Surgeon: Danie Binder, MD;  [facility];  Service: Endoscopy;  Laterality: N/A;  1030 - moved to 10:45 - Ginger to notify pt   . COLONOSCOPY WITH PROPOFOL N/A 10/17/2017   Procedure: COLONOSCOPY WITH PROPOFOL;  Surgeon: Danie Binder, MD;  [facility];  Service: Endoscopy;  Laterality: N/A;  10:30am  . DILATION AND CURETTAGE OF UTERUS    . LUMBAR SPINE SURGERY    . MASTECTOMY Right   . MASTECTOMY W/ SENTINEL NODE  BIOPSY Right 07/12/2013   Procedure: MASTECTOMY WITH SENTINEL LYMPH NODE BIOPSY;  Surgeon: Rolm Bookbinder, MD;  Location: [facility];  Service: General;  Laterality: Right;  . MASTOPEXY Left 01/14/2014   Procedure: LEFT BREAST MASTOPEXY FOR SYMMETRY ;  Surgeon: Irene Limbo, MD;  Location: [facility];  Service: Plastics;  Laterality: Left;  . PORT-A-CATH REMOVAL Left 01/14/2014   Procedure: REMOVAL PORT-A-CATH;  Surgeon: Irene Limbo, MD;  Location: [facility] [place];  Service: Plastics;  Laterality: Left;  . PORTACATH PLACEMENT N/A 09/16/2013   Procedure: INSERTION PORT-A-CATH;  Surgeon: Rolm Bookbinder, MD;  Location: [place];  Service: General;  Laterality: N/A;  . REDUCTION MAMMAPLASTY Left   . REMOVAL OF BILATERAL TISSUE EXPANDERS WITH PLACEMENT OF BILATERAL BREAST IMPLANTS Right 01/14/2014   Procedure: REMOVAL OF RIGHT TISSUE EXPANDERS WITH PLACEMENT OF PERMANENT BREAST IMPLANT;  Surgeon: Irene Limbo, MD;  Location: [place];  Service: Plastics;  Laterality: Right;  . TUBAL LIGATION    . VAGINAL HYSTERECTOMY      FAMILY HISTORY: Family History  Problem Relation Age of Onset  . Hypertension Mother   . Diabetes type II Mother   . CVA Mother   . Heart disease Mother   . Osteoarthritis Mother   . Depression Mother   . Other Mother        degenerative disc disease  . Alcoholism Father   . Pneumonia Father   . Colon cancer Maternal Aunt        age 45s  . Colon cancer Other        maternal great aunt  . Colon polyps Neg Hx     SOCIAL HISTORY: Social History   Socioeconomic History  . Marital status: Married    Spouse name: Not on file  . Number of children: 4  . Years of education: 12th  . Highest education level: High school graduate  Occupational History  . Occupation: retired 2014  Social Needs  . Financial resource strain: Not on file  . Food insecurity:    Worry: Not on file    Inability: Not on file  .  Transportation needs:    Medical: Not on file    Non-medical: Not on file  Tobacco Use  . Smoking status: Former Smoker    Packs/day: 0.25    Years: 2.00    Pack years: 0.50    Types: Cigarettes    Last attempt to quit: 09/14/1995    Years since quitting: 23.0  . Smokeless tobacco: Never Used  Substance and Sexual Activity  . Alcohol use: Yes    Comment: occ  . Drug use: No    Types: Marijuana    Comment: in the past  . Sexual activity: Yes  Lifestyle  . Physical activity:    Days per week: Not on file    Minutes per session: Not on file  . Stress: Not on file  Relationships  . Social connections:    Talks on phone: Not on file    Gets together: Not on file    Attends religious service: Not on file    Active member of club or organization: Not on file    Attends meetings of clubs or organizations: Not on file    Relationship status: Not on file  . Intimate partner violence:    Fear of current or ex partner: Not on file    Emotionally abused: Not on file    Physically abused: Not on file    Forced sexual activity: Not on file  Other Topics Concern  . Not on file  Social History Narrative   Lives at home with her husband.   Right-handed.   1 cup coffee per day, occasional soda.     PHYSICAL EXAM   Vitals:   09/25/18 0836  BP: (!) 142/74  Pulse: 64  Weight: 246 lb (111.6 kg)  Height: 5\' 7"  (1.702 m)    Not recorded      Body mass index is 38.53 kg/m.  PHYSICAL EXAMNIATION:  Gen: NAD, conversant, well nourised, obese, well groomed                     Cardiovascular: Regular rate rhythm, no peripheral edema, warm, nontender. Eyes: Conjunctivae clear without exudates or hemorrhage Neck: Supple, no carotid bruits. Pulmonary: Clear to auscultation bilaterally   NEUROLOGICAL EXAM: MMSE - Mini Mental State Exam 07/05/2018  Orientation to time 5  Orientation to Place 5  Registration 3  Attention/ Calculation 5  Recall 3  Language- name 2 objects 2    Language- repeat 1  Language- follow 3 step command 3  Language- read & follow direction 1  Write a sentence 1  Copy design 1  Total score 30  animal naming 12.   CRANIAL NERVES: CN II: Visual fields are full to confrontation.  Pupils are round equal and briskly reactive to light. CN III, IV, VI: extraocular movement are normal. No ptosis. CN V: Facial sensation is intact to pinprick in all 3 divisions bilaterally. Corneal responses are intact.  CN VII: Face is symmetric with normal eye closure and smile. CN VIII: Hearing is normal to rubbing fingers CN IX, X: Palate elevates symmetrically. Phonation is normal. CN XI: Head turning and shoulder shrug are intact CN XII: Tongue is midline with normal movements and no atrophy.  MOTOR: There is no pronator drift of out-stretched arms. Muscle bulk and tone are normal. Muscle strength is normal.  REFLEXES: Reflexes are 2+ and symmetric at the biceps, triceps, knees, and ankles. Plantar responses are flexor.  SENSORY: Intact to light touch, pinprick, positional sensation and vibratory sensation are intact in fingers and toes.  COORDINATION: Rapid alternating movements and fine finger movements are intact. There is no dysmetria on finger-to-nose and heel-knee-shin.    GAIT/STANCE: Posture is normal. Gait is steady with normal steps, base, arm swing, and turning. Heel and toe walking are normal. Tandem gait is normal.  Romberg is absent.   DIAGNOSTIC DATA (LABS, IMAGING, TESTING) - I reviewed  patient records, labs, notes, testing and imaging myself where available.   ASSESSMENT AND PLAN  Morgan Santos is a 68 y.o. 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 Ph.D.  [facility] [address], [place], [place] [address] Ph: [phone] Fax: [phone]  CC: [name], PA-C

## 2018-10-10 ENCOUNTER — Encounter (HOSPITAL_COMMUNITY): Payer: Medicare Other

## 2018-10-10 ENCOUNTER — Other Ambulatory Visit (HOSPITAL_COMMUNITY): Payer: Medicare Other

## 2018-11-01 ENCOUNTER — Other Ambulatory Visit: Payer: Self-pay | Admitting: Physician Assistant

## 2018-11-06 ENCOUNTER — Telehealth: Payer: Self-pay | Admitting: Physician Assistant

## 2018-11-06 NOTE — Telephone Encounter (Signed)
Pt called would like refill of her Xanax called to [facility]. She states she really needs it. Please call her to let her know if refilled or appt needs to be made.

## 2018-11-06 NOTE — Telephone Encounter (Signed)
Legally has to be seen for controlled substance.  Please have schedule appt w/ Glenard Haring.

## 2018-11-08 ENCOUNTER — Ambulatory Visit: Payer: Medicare Other | Admitting: Physician Assistant

## 2018-11-08 ENCOUNTER — Other Ambulatory Visit: Payer: Self-pay | Admitting: *Deleted

## 2018-11-08 ENCOUNTER — Other Ambulatory Visit: Payer: Self-pay | Admitting: Physician Assistant

## 2018-11-08 MED ORDER — ALPRAZOLAM 0.5 MG PO TABS: 0.5000 mg | ORAL_TABLET | Freq: Two times a day (BID) | ORAL | 2 refills | Status: DC | PRN

## 2018-11-08 NOTE — Telephone Encounter (Signed)
Due to the nation being under [facility] of Emergency, medication is being electronically prescribed without the patient being in the office. Telephone contact has been made with the patient and follow up appointment will be made. The DEA COVID 19 Policy is attached below.  I am sending 2 month refill  Electronic Prescribing of Controlled Substance (EPCS) DEA Policy: Questions and Answers for Prescribing Practitioners (EPCS) DEA Guidance: Use of Mobile Devices in the [place] of EPCS Telemedicine On September 21, 2018, the Network engineer of the Department of Health and Financial controller issues a public health emergency ([place] Emergency Declaration). Question: Can telemedicine now be used under the conditions outlined in Title 21, [place] States Code (U.S.C.), Section 802(54)(D)? Answer: Yes While a prescription for a controlled substance issued by means of the Internet (including telemedicine) must generally be predicated on an in-person medical evaluation (21 U.S.C. 829(e)), the Controlled Substances Act contains certain exceptions to this requirement. One such exception occurs when the Secretary of [place] has declared a public health emergency under 42 U.S.C. [phone] (section 784 of the [place]), as set forth in 21 U.S.C. 802(54)(D). Secretary Andee Poles declared such a public health emergency with regard to COVID-19 on September 21, 2018. (http://[email or web address].[email or web address]). For as long as the [employer] of a public health emergency remains in effect, DEA-registered practitioners may issue prescriptions for controlled substances to patients for whom they have not conducted an in-person medical evaluation, provided all of the following conditions are met: Marland Kitchen The prescription is issued for a legitimate medical purpose by a practitioner acting in the usual course of  his/her professional practice  . The telemedicine communication is conducted using an audio-visual, real-time, two-way interactive communication system.  . The practitioner is acting in accordance with applicable Federal and State law. Provided the practitioner satisfies the above requirements, the practitioner may issue the prescription using any of the methods of prescribing currently available and in the manner set forth in the [facility] regulations. Thus, the practitioner may issue a prescription either electronically (for schedules II-V) or by calling in an emergency schedule II prescription to the pharmacy, or by calling in a schedule III-V prescription to the pharmacy. Important note: If the prescribing practitioner has previously conducted an in-person medical evaluation of the patient, the practitioner may issue a prescription for a controlled substance after having communicated with the patient via telemedicine, or any other means, regardless of whether a public health emergency has been declared by the [place], so long as the prescription is issued for a legitimate medical purpose and the practitioner is acting in the usual course of his/her professional practice. In addition, for the prescription to be valid, the practitioner must comply with any applicable State laws.

## 2018-11-22 ENCOUNTER — Inpatient Hospital Stay: Payer: 59 | Admitting: Hematology and Oncology

## 2018-11-23 ENCOUNTER — Telehealth: Payer: Self-pay | Admitting: Hematology and Oncology

## 2018-11-23 NOTE — Telephone Encounter (Signed)
Rescheduled appt per 4/1 sch msg. Called and spoke with patient. Confirmed date and time of appt.

## 2018-12-17 ENCOUNTER — Other Ambulatory Visit: Payer: Self-pay | Admitting: Physician Assistant

## 2018-12-17 ENCOUNTER — Other Ambulatory Visit: Payer: Self-pay | Admitting: Hematology and Oncology

## 2018-12-17 DIAGNOSIS — E785 Hyperlipidemia, unspecified: Secondary | ICD-10-CM

## 2019-01-07 ENCOUNTER — Encounter: Payer: Self-pay | Admitting: Family

## 2019-01-07 ENCOUNTER — Other Ambulatory Visit: Payer: Self-pay

## 2019-01-07 ENCOUNTER — Ambulatory Visit (INDEPENDENT_AMBULATORY_CARE_PROVIDER_SITE_OTHER): Payer: 59 | Admitting: Family

## 2019-01-07 ENCOUNTER — Other Ambulatory Visit: Payer: Self-pay | Admitting: Physician Assistant

## 2019-01-07 DIAGNOSIS — F331 Major depressive disorder, recurrent, moderate: Secondary | ICD-10-CM | POA: Diagnosis not present

## 2019-01-07 DIAGNOSIS — L255 Unspecified contact dermatitis due to plants, except food: Secondary | ICD-10-CM | POA: Diagnosis not present

## 2019-01-07 MED ORDER — CITALOPRAM HYDROBROMIDE 40 MG PO TABS: 40.0000 mg | ORAL_TABLET | Freq: Every day | ORAL | 3 refills | Status: DC

## 2019-01-07 MED ORDER — PREDNISONE 10 MG (21) PO TBPK: ORAL_TABLET | ORAL | 0 refills | Status: DC

## 2019-01-07 MED ORDER — TRIAMCINOLONE ACETONIDE 0.5 % EX OINT: 1.0000 "application " | TOPICAL_OINTMENT | Freq: Two times a day (BID) | CUTANEOUS | 0 refills | Status: DC

## 2019-01-07 NOTE — Progress Notes (Signed)
   Virtual Visit via telephone Note  I connected with ABBIEGAIL Santos on 01/07/19 at 10:29 AM by telephone and verified that I am speaking with the correct person using two identifiers. Morgan Santos is currently located at home and husband is currently with her during visit. The provider, Evelina Dun, FNP is located in their office at time of visit.  I discussed the limitations, risks, security and privacy concerns of performing an evaluation and management service by telephone and the availability of in person appointments. I also discussed with the patient that there may be a patient responsible charge related to this service. The patient expressed understanding and agreed to proceed.   History and Present Illness:  Rash  This is a new problem. The current episode started in the past 7 days. The problem has been gradually worsening since onset. The affected locations include the right arm, left arm and face. The rash is characterized by pain, redness and itchiness. She was exposed to plant contact. Pertinent negatives include no congestion, cough, fatigue, fever, shortness of breath or sore throat. Past treatments include antihistamine and anti-itch cream. The treatment provided mild relief.  Depression         This is a chronic (Requesting refill for Celexa ) problem.  The onset quality is gradual.   The problem occurs intermittently.  Associated symptoms include irritable, decreased interest and sad.  Associated symptoms include no fatigue, no helplessness and no hopelessness.     Review of Systems  Constitutional: Negative for fatigue and fever.  HENT: Negative for congestion and sore throat.   Respiratory: Negative for cough and shortness of breath.   Skin: Positive for rash.  Psychiatric/Behavioral: Positive for depression.  All other systems reviewed and are negative.    Observations/Objective: No SOB or distress   Assessment and Plan: 1. Contact dermatitis due to plant -Do  not scratch -Wear protective clothing while outside- Long sleeves and long pants -Take a shower as soon as possible after being outside RTO if symptoms worsen or do not improve  - predniSONE (STERAPRED UNI-PAK 21 TAB) 10 MG (21) TBPK tablet; Use as directed  Dispense: 21 tablet; Refill: 0 - triamcinolone ointment (KENALOG) 0.5 %; Apply 1 application topically 2 (two) times daily.  Dispense: 30 g; Refill: 0  2. Moderate episode of recurrent major depressive disorder (HCC) Will refill Celexa for patient. She will make a follow up with PCP in the next few weeks - citalopram (CELEXA) 40 MG tablet; Take 1 tablet (40 mg total) by mouth daily.  Dispense: 90 tablet; Refill: 3     I discussed the assessment and treatment plan with the patient. The patient was provided an opportunity to ask questions and all were answered. The patient agreed with the plan and demonstrated an understanding of the instructions.   The patient was advised to call back or seek an in-person evaluation if the symptoms worsen or if the condition fails to improve as anticipated.  The above assessment and management plan was discussed with the patient. The patient verbalized understanding of and has agreed to the management plan. Patient is aware to call the clinic if symptoms persist or worsen. Patient is aware when to return to the clinic for a follow-up visit. Patient educated on when it is appropriate to go to the emergency department.   Time call ended: 10:38 AM   I provided 9 minutes of non-face-to-face time during this encounter.    Evelina Dun, FNP

## 2019-02-28 NOTE — Assessment & Plan Note (Signed)
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 ER 99% PR 99% HER-2 negative: Intermediate risk Oncotype DX recurrence score 23. Patient received 4 cycles of Taxotere Cytoxan chemotherapy. She had breast reconstruction subsequently in May 2015, started Arimidex May 2015  Surveillance:  1. Breast exam 4/4/2019did not reveal any lumps or nodules 2. Mammogram1/30/2020is benign density category B 3. Bone density April 2015 showed T score -0.6 We will need to obtain another bone density  Arimidex toxicities: 1. Hot flashes accompanied by sweating especially in hot weather 2. Fatigue 3.Weight gain: Refer her to [facility] health weight loss program Right leg and back pain: Could be arthritis versus bursitis   Low back pain: Patient has seen a chiropractor. Return to clinic 1 year for follow-up

## 2019-03-01 ENCOUNTER — Telehealth: Payer: Self-pay | Admitting: Hematology and Oncology

## 2019-03-01 NOTE — Telephone Encounter (Signed)
I talk with patient regarding video visit °

## 2019-03-06 ENCOUNTER — Telehealth: Payer: Self-pay | Admitting: Hematology and Oncology

## 2019-03-06 NOTE — Telephone Encounter (Signed)
Contacted pt to verify webex visit for pre reg °

## 2019-03-06 NOTE — Progress Notes (Signed)
HEMATOLOGY-ONCOLOGY [facility] VIDEO VISIT PROGRESS NOTE  I connected with [name] on 03/07/2019 at  9:30 AM EDT by MyChart video conference and verified that I am speaking with the correct person using two identifiers.  I discussed the limitations, risks, security and privacy concerns of performing an evaluation and management service by MyChart and the availability of in person appointments.  I also discussed with the patient that there may be a patient responsible charge related to this service. The patient expressed understanding and agreed to proceed.  Patient's Location: Home Physician Location: Clinic  CHIEF COMPLIANT: Follow-up on anastrozole therapy  INTERVAL HISTORY: Morgan Santos is a 68 y.o. female with above-mentioned history of right breast cancer treated with mastectomy followed by adjuvant chemotherapy and breast reconstruction and is currently on anastrozole therapy. I last saw her over a year ago. Left breast mammogram on 09/20/18 showed no evidence of malignancy. She presents over MyChart today for annual follow-up.   Oncology History  Breast cancer of upper-outer quadrant of right female breast ([place])  04/29/2013 Mammogram   Right breast mass, 7 mm lesion at 9:00 position, IDC with DCIS grade 2 ER  PR 99% positive HER-2 negative Ki-67 11%: Second mass IDC ER/PR positive HER-2 negative Ki-67 11%, MRI 6 cm including enhancement    07/12/2013 Surgery   Right breast mastectomy: IDC grade 1, 2 foci 1.2 and 1.1 cm, LVI positive, 2 SLN negative, Oncotype DX score 23 intermediate risk, ROR. 14%   09/26/2013 - 11/28/2013 Chemotherapy   Adjuvant Taxotere and Cytoxan x4 cycles   01/14/2014 Surgery   Right breast reconstruction and right port removal   01/17/2014 -  Anti-estrogen oral therapy   anastrozole 1 mg daily     REVIEW OF SYSTEMS:   Constitutional: Denies fevers, chills or abnormal weight loss Eyes: Denies blurriness of vision Ears, nose, mouth, throat, and face: Denies  mucositis or sore throat Respiratory: Denies cough, dyspnea or wheezes Cardiovascular: Denies palpitation, chest discomfort Gastrointestinal:  Denies nausea, heartburn or change in bowel habits Skin: Denies abnormal skin rashes Lymphatics: Denies new lymphadenopathy or easy bruising Neurological:Denies numbness, tingling or new weaknesses, complains of back pain Behavioral/Psych: Mood is stable, no new changes  Extremities: No lower extremity edema Breast: denies any pain or lumps or nodules in either breasts All other systems were reviewed with the patient and are negative.  Observations/Objective:  There were no vitals filed for this visit. There is no height or weight on file to calculate BMI.  I have reviewed the data as listed CMP Latest Ref Rng & Units 12/27/2017 07/26/2017 09/21/2016  Glucose 65 - 99 mg/dL 90 84 97  BUN 8 - 27 mg/dL 10 13 17   Creatinine 0.57 - 1.00 mg/dL 0.74 0.97 0.73  Sodium 134 - 144 mmol/L 141 145(H) 141  Potassium 3.5 - 5.2 mmol/L 4.1 4.3 4.2  Chloride 96 - 106 mmol/L 104 105 101  CO2 20 - 29 mmol/L 23 28 26   Calcium 8.7 - 10.3 mg/dL 9.3 9.4 9.2  Total Protein 6.0 - 8.5 g/dL 6.3 6.7 6.7  Total Bilirubin 0.0 - 1.2 mg/dL 0.6 0.4 0.7  Alkaline Phos 39 - 117 IU/L 140(H) 129(H) 145(H)  AST 0 - 40 IU/L 22 20 18   ALT 0 - 32 IU/L 19 17 16     Lab Results  Component Value Date   WBC 4.5 12/27/2017   HGB 13.0 12/27/2017   HCT 39.3 12/27/2017   MCV 88 12/27/2017   PLT 215 12/27/2017  NEUTROABS 2.0 12/27/2017      Assessment Plan:  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 ER 99% PR 99% HER-2 negative: Intermediate risk Oncotype DX recurrence score 23. Patient received 4 cycles of Taxotere Cytoxan chemotherapy. She had breast reconstruction subsequently in May 2015, started Arimidex May 2015  Surveillance:  1. Breast  exam 4/4/2019did not reveal any lumps or nodules 2. Mammogram1/30/2020is benign density category B 3. Bone density April 2015 showed T score -0.6 We will need to obtain another bone density  Arimidex toxicities: 1. Hot flashes accompanied by sweating especially in hot weather 2. Fatigue 3.Weight gain: Refer her to [facility] health weight loss program Right leg and back pain: Could be arthritis versus bursitis   Low back pain: chronic Return to clinic 1 year for follow-up   I discussed the assessment and treatment plan with the patient. The patient was provided an opportunity to ask questions and all were answered. The patient agreed with the plan and demonstrated an understanding of the instructions. The patient was advised to call back or seek an in-person evaluation if the symptoms worsen or if the condition fails to improve as anticipated.   I provided 15 minutes of face-to-face MyChart video visit time during this encounter.    Rulon Eisenmenger, MD 03/07/2019   I, Molly Dorshimer, am acting as scribe for Nicholas Lose, MD.  I have reviewed the above documentation for accuracy and completeness, and I agree with the above.

## 2019-03-07 ENCOUNTER — Inpatient Hospital Stay: Payer: 59 | Attending: Hematology and Oncology | Admitting: Hematology and Oncology

## 2019-03-07 DIAGNOSIS — Z79811 Long term (current) use of aromatase inhibitors: Secondary | ICD-10-CM

## 2019-03-07 DIAGNOSIS — R5383 Other fatigue: Secondary | ICD-10-CM | POA: Diagnosis not present

## 2019-03-07 DIAGNOSIS — M545 Low back pain: Secondary | ICD-10-CM | POA: Insufficient documentation

## 2019-03-07 DIAGNOSIS — Z170 Estrogen receptor positive status [ER+]: Secondary | ICD-10-CM

## 2019-03-07 DIAGNOSIS — R232 Flushing: Secondary | ICD-10-CM | POA: Insufficient documentation

## 2019-03-07 DIAGNOSIS — R634 Abnormal weight loss: Secondary | ICD-10-CM | POA: Diagnosis not present

## 2019-03-07 DIAGNOSIS — G8929 Other chronic pain: Secondary | ICD-10-CM | POA: Insufficient documentation

## 2019-03-07 DIAGNOSIS — C50411 Malignant neoplasm of upper-outer quadrant of right female breast: Secondary | ICD-10-CM | POA: Insufficient documentation

## 2019-03-07 DIAGNOSIS — Z79899 Other long term (current) drug therapy: Secondary | ICD-10-CM | POA: Diagnosis not present

## 2019-03-07 DIAGNOSIS — Z9011 Acquired absence of right breast and nipple: Secondary | ICD-10-CM

## 2019-03-07 MED ORDER — ANASTROZOLE 1 MG PO TABS
ORAL_TABLET | ORAL | 3 refills | Status: DC
Start: 2019-03-07 — End: 2019-08-28

## 2019-03-08 ENCOUNTER — Telehealth: Payer: Self-pay | Admitting: Hematology and Oncology

## 2019-03-08 NOTE — Telephone Encounter (Signed)
I talk with patient regarding schedule  

## 2019-05-31 ENCOUNTER — Other Ambulatory Visit: Payer: Self-pay

## 2019-06-03 ENCOUNTER — Ambulatory Visit (INDEPENDENT_AMBULATORY_CARE_PROVIDER_SITE_OTHER): Payer: 59 | Admitting: Physician Assistant

## 2019-06-03 ENCOUNTER — Encounter: Payer: Self-pay | Admitting: Physician Assistant

## 2019-06-03 ENCOUNTER — Other Ambulatory Visit: Payer: Self-pay

## 2019-06-03 VITALS — BP 128/77 | HR 72 | Temp 97.0°F | Ht 67.0 in | Wt 256.4 lb

## 2019-06-03 DIAGNOSIS — F419 Anxiety disorder, unspecified: Secondary | ICD-10-CM

## 2019-06-03 DIAGNOSIS — Z0000 Encounter for general adult medical examination without abnormal findings: Secondary | ICD-10-CM

## 2019-06-03 MED ORDER — ALPRAZOLAM 0.5 MG PO TABS: 0.5000 mg | ORAL_TABLET | Freq: Two times a day (BID) | ORAL | 0 refills | Status: DC | PRN

## 2019-06-03 NOTE — Patient Instructions (Signed)
Health Maintenance After Age 68 After age 68, you are at a higher risk for certain long-term diseases and infections as well as injuries from falls. Falls are a major cause of broken bones and head injuries in people who are older than age 68. Getting regular preventive care can help to keep you healthy and well. Preventive care includes getting regular testing and making lifestyle changes as recommended by your health care provider. Talk with your health care provider about:  Which screenings and tests you should have. A screening is a test that checks for a disease when you have no symptoms.  A diet and exercise plan that is right for you. What should I know about screenings and tests to prevent falls? Screening and testing are the best ways to find a health problem early. Early diagnosis and treatment give you the best chance of managing medical conditions that are common after age 68. Certain conditions and lifestyle choices may make you more likely to have a fall. Your health care provider may recommend:  Regular vision checks. Poor vision and conditions such as cataracts can make you more likely to have a fall. If you wear glasses, make sure to get your prescription updated if your vision changes.  Medicine review. Work with your health care provider to regularly review all of the medicines you are taking, including over-the-counter medicines. Ask your health care provider about any side effects that may make you more likely to have a fall. Tell your health care provider if any medicines that you take make you feel dizzy or sleepy.  Osteoporosis screening. Osteoporosis is a condition that causes the bones to get weaker. This can make the bones weak and cause them to break more easily.  Blood pressure screening. Blood pressure changes and medicines to control blood pressure can make you feel dizzy.  Strength and balance checks. Your health care provider may recommend certain tests to check your  strength and balance while standing, walking, or changing positions.  Foot health exam. Foot pain and numbness, as well as not wearing proper footwear, can make you more likely to have a fall.  Depression screening. You may be more likely to have a fall if you have a fear of falling, feel emotionally low, or feel unable to do activities that you used to do.  Alcohol use screening. Using too much alcohol can affect your balance and may make you more likely to have a fall. What actions can I take to lower my risk of falls? General instructions  Talk with your health care provider about your risks for falling. Tell your health care provider if: ? You fall. Be sure to tell your health care provider about all falls, even ones that seem minor. ? You feel dizzy, sleepy, or off-balance.  Take over-the-counter and prescription medicines only as told by your health care provider. These include any supplements.  Eat a healthy diet and maintain a healthy weight. A healthy diet includes low-fat dairy products, low-fat (lean) meats, and fiber from whole grains, beans, and lots of fruits and vegetables. Home safety  Remove any tripping hazards, such as rugs, cords, and clutter.  Install safety equipment such as grab bars in bathrooms and safety rails on stairs.  Keep rooms and walkways well-lit. Activity   Follow a regular exercise program to stay fit. This will help you maintain your balance. Ask your health care provider what types of exercise are appropriate for you.  If you need a cane or   walker, use it as recommended by your health care provider.  Wear supportive shoes that have nonskid soles. Lifestyle  Do not drink alcohol if your health care provider tells you not to drink.  If you drink alcohol, limit how much you have: ? 0-1 drink a day for women. ? 0-2 drinks a day for men.  Be aware of how much alcohol is in your drink. In the [place] one drink equals one typical bottle of beer (12  oz), one-half glass of wine (5 oz), or one shot of hard liquor (1 oz).  Do not use any products that contain nicotine or tobacco, such as cigarettes and e-cigarettes. If you need help quitting, ask your health care provider. Summary  Having a healthy lifestyle and getting preventive care can help to protect your health and wellness after age 68.  Screening and testing are the best way to find a health problem early and help you avoid having a fall. Early diagnosis and treatment give you the best chance for managing medical conditions that are more common for people who are older than age 68.  Falls are a major cause of broken bones and head injuries in people who are older than age 68. Take precautions to prevent a fall at home.  Work with your health care provider to learn what changes you can make to improve your health and wellness and to prevent falls. This information is not intended to replace advice given to you by your health care provider. Make sure you discuss any questions you have with your health care provider. Document Released: 06/21/2017 Document Revised: 11/29/2018 Document Reviewed: 06/21/2017 Elsevier Patient Education  2020 Elsevier Inc.  

## 2019-06-04 ENCOUNTER — Other Ambulatory Visit: Payer: Medicare Other

## 2019-06-04 DIAGNOSIS — Z0000 Encounter for general adult medical examination without abnormal findings: Secondary | ICD-10-CM

## 2019-06-05 LAB — LIPID PANEL
Chol/HDL Ratio: 3.8 ratio (ref 0.0–4.4)
Cholesterol, Total: 182 mg/dL (ref 100–199)
HDL: 48 mg/dL (ref >39)
LDL Chol Calc (NIH): 110 mg/dL — ABNORMAL HIGH (ref 0–99)
Triglycerides: 138 mg/dL (ref 0–149)
VLDL Cholesterol Cal: 24 mg/dL (ref 5–40)

## 2019-06-05 LAB — CBC WITH DIFFERENTIAL/PLATELET
Basophils Absolute: 0.1 10*3/uL (ref 0.0–0.2)
Basos: 1 %
EOS (ABSOLUTE): 0.1 10*3/uL (ref 0.0–0.4)
Eos: 2 %
Hematocrit: 40.9 % (ref 34.0–46.6)
Hemoglobin: 13.4 g/dL (ref 11.1–15.9)
Immature Grans (Abs): 0 10*3/uL (ref 0.0–0.1)
Immature Granulocytes: 0 %
Lymphocytes Absolute: 1.9 10*3/uL (ref 0.7–3.1)
Lymphs: 39 %
MCH: 29.2 pg (ref 26.6–33.0)
MCHC: 32.8 g/dL (ref 31.5–35.7)
MCV: 89 fL (ref 79–97)
Monocytes Absolute: 0.3 10*3/uL (ref 0.1–0.9)
Monocytes: 7 %
Neutrophils Absolute: 2.5 10*3/uL (ref 1.4–7.0)
Neutrophils: 51 %
Platelets: 230 10*3/uL (ref 150–450)
RBC: 4.59 x10E6/uL (ref 3.77–5.28)
RDW: 12.8 % (ref 11.7–15.4)
WBC: 5 10*3/uL (ref 3.4–10.8)

## 2019-06-05 LAB — CMP14+EGFR
ALT: 22 IU/L (ref 0–32)
AST: 24 IU/L (ref 0–40)
Albumin/Globulin Ratio: 1.9 (ref 1.2–2.2)
Albumin: 4.2 g/dL (ref 3.8–4.8)
Alkaline Phosphatase: 130 IU/L — ABNORMAL HIGH (ref 39–117)
BUN/Creatinine Ratio: 18 (ref 12–28)
BUN: 13 mg/dL (ref 8–27)
Bilirubin Total: 0.6 mg/dL (ref 0.0–1.2)
CO2: 25 mmol/L (ref 20–29)
Calcium: 9.3 mg/dL (ref 8.7–10.3)
Chloride: 103 mmol/L (ref 96–106)
Creatinine, Ser: 0.73 mg/dL (ref 0.57–1.00)
GFR calc Af Amer: 98 mL/min/{1.73_m2} (ref >59)
GFR calc non Af Amer: 85 mL/min/{1.73_m2} (ref >59)
Globulin, Total: 2.2 g/dL (ref 1.5–4.5)
Glucose: 98 mg/dL (ref 65–99)
Potassium: 4.1 mmol/L (ref 3.5–5.2)
Sodium: 139 mmol/L (ref 134–144)
Total Protein: 6.4 g/dL (ref 6.0–8.5)

## 2019-06-05 LAB — TSH: TSH: 2.16 u[IU]/mL (ref 0.450–4.500)

## 2019-06-06 NOTE — Progress Notes (Signed)
BP 128/77   Pulse 72   Temp (!) 97 F (36.1 C) (Temporal)   Ht 5' 7"  (1.702 m)   Wt 256 lb 6.4 oz (116.3 kg)   SpO2 99%   BMI 40.16 kg/m    Subjective:    Patient ID: Morgan Santos, female    DOB: November 18, 1950, 68 y.o.   MRN: [id number]  HPI: Morgan Santos is a 68 y.o. female presenting on 06/03/2019 for Annual Exam  This patient comes in for an annual exam she has seen her neurologist and oncologist recently.  There are no significant changes.  All of her medications are reviewed.  She denies any difficulty with her heartburn, hypertension.  She states that overall she is doing very well.  Not having any difficulty having to take care of of her husband who has developed Parkinson's disorder.  Past Medical History:  Diagnosis Date  . Anemia    when she was younger  . Anxiety   . Arthritis    hip- R, back; osteoarthritis  . Asthma   . Breast cancer ([place])    right  . Complication of anesthesia   . Constipation   . Depression    pt. remarks that she "takes respridal because if I don't take it I get angry"   . GERD (gastroesophageal reflux disease)    pt. uses vinegar for heartburn, ususally once per week   . H/O degenerative disc disease   . Hyperlipidemia   . Hypertension    "when I get upset"  Not on medication  . Memory loss   . PONV (postoperative nausea and vomiting)    "just for one day"  . Stroke [facility]) ?2010 or 2011   2010, [facility] [place] for stroke "mini stroke" short term memory problems since   Relevant past medical, surgical, family and social history reviewed and updated as indicated. Interim medical history since our last visit reviewed. Allergies and medications reviewed and updated. DATA REVIEWED: CHART IN EPIC  Family History reviewed for pertinent findings.  Review of Systems  Constitutional: Negative.  Negative for activity change, fatigue and fever.  HENT: Negative.   Eyes: Negative.   Respiratory: Negative.  Negative for cough.    Cardiovascular: Negative.  Negative for chest pain.  Gastrointestinal: Negative.  Negative for abdominal pain.  Endocrine: Negative.   Genitourinary: Negative.  Negative for dysuria.  Musculoskeletal: Positive for arthralgias and back pain.  Skin: Negative.   Neurological: Negative.   Psychiatric/Behavioral: The patient is nervous/anxious.     Allergies as of 06/03/2019      Reactions   Codeine Nausea And Vomiting   Crestor [rosuvastatin Calcium]    Muscle pain   Lipitor [atorvastatin]    Muscle pain   Namenda [memantine Hcl]    Nightmares    Peanut-containing Drug Products    Peanut butter triggers asthma    Multihance [gadobenate] Nausea Only, Cough   PT HAD INCREASED MUCOUS PRODUCTION AND PHLEGMY COUGH LASTING 10 MINS AFTER INJECTION, PT SENT HOME WITH BENADRYL, PREV NAUSEA AFTER GAD      Medication List       Accurate as of June 03, 2019 11:59 PM. If you have any questions, ask your nurse or doctor.        STOP taking these medications   traZODone 100 MG tablet Commonly known as: DESYREL Stopped by: [name], PA-C   triamcinolone ointment 0.5 % Commonly known as: KENALOG Stopped by: [name], PA-C  TAKE these medications   ALPRAZolam 0.5 MG tablet Commonly known as: XANAX Take 1 tablet (0.5 mg total) by mouth 2 (two) times daily as needed.   anastrozole 1 MG tablet Commonly known as: ARIMIDEX TAKE ONE (1) TABLET EACH DAY   ARTIFICIAL TEAR OP Place 1 drop into both eyes daily as needed (dry eyes).   aspirin EC 81 MG tablet Take 81 mg by mouth every other day.   B-12 2500 MCG Subl Place 2,500 mcg under the tongue daily.   budesonide-formoterol 160-4.5 MCG/ACT inhaler Commonly known as: SYMBICORT Inhale 1 puff into the lungs 2 (two) times daily. What changed:   when to take this  reasons to take this   Calcium-Magnesium-Zinc 500-250-12.5 MG Tabs Take 1 tablet by mouth daily.   citalopram 40 MG tablet Commonly known as: CELEXA  Take 1 tablet (40 mg total) by mouth daily.   meloxicam 7.5 MG tablet Commonly known as: MOBIC TAKE ONE (1) TABLET EACH DAY   pravastatin 80 MG tablet Commonly known as: PRAVACHOL Take 1 tablet (80 mg total) by mouth daily.   Vitamin D-3 125 MCG (5000 UT) Tabs Take 10,000 Units by mouth daily.          Objective:    BP 128/77   Pulse 72   Temp (!) 97 F (36.1 C) (Temporal)   Ht 5' 7"  (1.702 m)   Wt 256 lb 6.4 oz (116.3 kg)   SpO2 99%   BMI 40.16 kg/m   Allergies  Allergen Reactions  . Codeine Nausea And Vomiting  . Crestor [Rosuvastatin Calcium]     Muscle pain  . Lipitor [Atorvastatin]     Muscle pain  . Namenda [Memantine Hcl]     Nightmares   . Peanut-Containing Drug Products     Peanut butter triggers asthma   . Multihance [Gadobenate] Nausea Only and Cough    PT HAD INCREASED MUCOUS PRODUCTION AND PHLEGMY COUGH LASTING 10 MINS AFTER INJECTION, PT SENT HOME WITH BENADRYL, PREV NAUSEA AFTER GAD    Wt Readings from Last 3 Encounters:  06/03/19 256 lb 6.4 oz (116.3 kg)  09/25/18 246 lb (111.6 kg)  07/05/18 247 lb (112 kg)    Physical Exam Constitutional:      Appearance: She is well-developed.  HENT:     Head: Normocephalic and atraumatic.     Right Ear: Tympanic membrane, ear canal and external ear normal.     Left Ear: Tympanic membrane, ear canal and external ear normal.     Nose: Nose normal. No rhinorrhea.     Mouth/Throat:     Pharynx: No oropharyngeal exudate or posterior oropharyngeal erythema.  Eyes:     Conjunctiva/sclera: Conjunctivae normal.     Pupils: Pupils are equal, round, and reactive to light.  Neck:     Musculoskeletal: Normal range of motion and neck supple.  Cardiovascular:     Rate and Rhythm: Normal rate and regular rhythm.     Heart sounds: Normal heart sounds.  Pulmonary:     Effort: Pulmonary effort is normal.     Breath sounds: Normal breath sounds.  Abdominal:     General: Bowel sounds are normal.     Palpations:  Abdomen is soft.  Skin:    General: Skin is warm and dry.     Findings: No rash.  Neurological:     Mental Status: She is alert and oriented to person, place, and time.     Deep Tendon Reflexes: Reflexes are normal and  symmetric.  Psychiatric:        Behavior: Behavior normal.        Thought Content: Thought content normal.        Judgment: Judgment normal.         Assessment & Plan:   1. Well adult exam - CBC with Differential/Platelet; Future - CMP14+EGFR; Future - Lipid panel; Future - TSH; Future  2. Anxiety - ALPRAZolam (XANAX) 0.5 MG tablet; Take 1 tablet (0.5 mg total) by mouth 2 (two) times daily as needed.  Dispense: 60 tablet; Refill: 0   Continue all other maintenance medications as listed above.  Follow up plan: No follow-ups on file.  Educational handout given for health maintenance  [name] PA-C [facility] [address]  [place], [place] [address] [phone]   06/06/2019, 8:00 PM

## 2019-07-15 ENCOUNTER — Other Ambulatory Visit: Payer: Self-pay | Admitting: Physician Assistant

## 2019-07-15 DIAGNOSIS — F419 Anxiety disorder, unspecified: Secondary | ICD-10-CM

## 2019-08-06 ENCOUNTER — Other Ambulatory Visit: Payer: Self-pay | Admitting: Physician Assistant

## 2019-08-06 DIAGNOSIS — Z1231 Encounter for screening mammogram for malignant neoplasm of breast: Secondary | ICD-10-CM

## 2019-08-28 ENCOUNTER — Other Ambulatory Visit: Payer: Self-pay

## 2019-08-28 MED ORDER — ANASTROZOLE 1 MG PO TABS: ORAL_TABLET | ORAL | 3 refills | Status: DC

## 2019-08-29 ENCOUNTER — Telehealth: Payer: Self-pay | Admitting: Physician Assistant

## 2019-08-29 ENCOUNTER — Other Ambulatory Visit: Payer: Self-pay | Admitting: *Deleted

## 2019-08-29 DIAGNOSIS — F331 Major depressive disorder, recurrent, moderate: Secondary | ICD-10-CM

## 2019-08-29 DIAGNOSIS — E785 Hyperlipidemia, unspecified: Secondary | ICD-10-CM

## 2019-08-29 MED ORDER — PRAVASTATIN SODIUM 80 MG PO TABS: 80.0000 mg | ORAL_TABLET | Freq: Every day | ORAL | 1 refills | Status: DC

## 2019-08-29 MED ORDER — MELOXICAM 7.5 MG PO TABS: ORAL_TABLET | ORAL | 0 refills | Status: DC

## 2019-08-29 MED ORDER — CITALOPRAM HYDROBROMIDE 40 MG PO TABS: 40.0000 mg | ORAL_TABLET | Freq: Every day | ORAL | 0 refills | Status: DC

## 2019-08-29 NOTE — Addendum Note (Signed)
Addended by: Antonietta Barcelona D on: 08/29/2019 02:32 PM   Modules accepted: Orders

## 2019-09-02 ENCOUNTER — Other Ambulatory Visit: Payer: Self-pay | Admitting: Physician Assistant

## 2019-09-02 DIAGNOSIS — F331 Major depressive disorder, recurrent, moderate: Secondary | ICD-10-CM

## 2019-09-02 MED ORDER — CITALOPRAM HYDROBROMIDE 20 MG PO TABS: 20.0000 mg | ORAL_TABLET | Freq: Every day | ORAL | 1 refills | Status: DC

## 2019-09-23 ENCOUNTER — Other Ambulatory Visit: Payer: Self-pay

## 2019-09-24 ENCOUNTER — Ambulatory Visit (INDEPENDENT_AMBULATORY_CARE_PROVIDER_SITE_OTHER): Payer: Medicare Other | Admitting: Physician Assistant

## 2019-09-24 ENCOUNTER — Encounter: Payer: Self-pay | Admitting: Physician Assistant

## 2019-09-24 VITALS — BP 150/84 | HR 59 | Temp 95.7°F | Ht 69.5 in | Wt 265.0 lb

## 2019-09-24 DIAGNOSIS — F419 Anxiety disorder, unspecified: Secondary | ICD-10-CM

## 2019-09-24 DIAGNOSIS — E785 Hyperlipidemia, unspecified: Secondary | ICD-10-CM | POA: Diagnosis not present

## 2019-09-24 DIAGNOSIS — F331 Major depressive disorder, recurrent, moderate: Secondary | ICD-10-CM | POA: Diagnosis not present

## 2019-09-24 DIAGNOSIS — Z1159 Encounter for screening for other viral diseases: Secondary | ICD-10-CM

## 2019-09-24 DIAGNOSIS — Z0000 Encounter for general adult medical examination without abnormal findings: Secondary | ICD-10-CM

## 2019-09-24 DIAGNOSIS — Z0001 Encounter for general adult medical examination with abnormal findings: Secondary | ICD-10-CM

## 2019-09-24 DIAGNOSIS — R5383 Other fatigue: Secondary | ICD-10-CM | POA: Diagnosis not present

## 2019-09-24 MED ORDER — PRAVASTATIN SODIUM 80 MG PO TABS: 80.0000 mg | ORAL_TABLET | Freq: Every day | ORAL | 1 refills | Status: DC

## 2019-09-24 MED ORDER — CITALOPRAM HYDROBROMIDE 10 MG PO TABS: 20.0000 mg | ORAL_TABLET | Freq: Every day | ORAL | 3 refills | Status: DC

## 2019-09-24 MED ORDER — ALPRAZOLAM 0.5 MG PO TABS: 0.5000 mg | ORAL_TABLET | Freq: Two times a day (BID) | ORAL | 0 refills | Status: DC | PRN

## 2019-09-24 MED ORDER — MELOXICAM 7.5 MG PO TABS: ORAL_TABLET | ORAL | 1 refills | Status: DC

## 2019-09-25 LAB — CMP14+EGFR
ALT: 16 IU/L (ref 0–32)
AST: 21 IU/L (ref 0–40)
Albumin/Globulin Ratio: 1.5 (ref 1.2–2.2)
Albumin: 4.1 g/dL (ref 3.8–4.8)
Alkaline Phosphatase: 137 IU/L — ABNORMAL HIGH (ref 39–117)
BUN/Creatinine Ratio: 17 (ref 12–28)
BUN: 12 mg/dL (ref 8–27)
Bilirubin Total: 0.7 mg/dL (ref 0.0–1.2)
CO2: 25 mmol/L (ref 20–29)
Calcium: 9.5 mg/dL (ref 8.7–10.3)
Chloride: 101 mmol/L (ref 96–106)
Creatinine, Ser: 0.71 mg/dL (ref 0.57–1.00)
GFR calc Af Amer: 101 mL/min/{1.73_m2} (ref >59)
GFR calc non Af Amer: 88 mL/min/{1.73_m2} (ref >59)
Globulin, Total: 2.8 g/dL (ref 1.5–4.5)
Glucose: 93 mg/dL (ref 65–99)
Potassium: 4.2 mmol/L (ref 3.5–5.2)
Sodium: 140 mmol/L (ref 134–144)
Total Protein: 6.9 g/dL (ref 6.0–8.5)

## 2019-09-25 LAB — CBC WITH DIFFERENTIAL/PLATELET
Basophils Absolute: 0.1 10*3/uL (ref 0.0–0.2)
Basos: 1 %
EOS (ABSOLUTE): 0.2 10*3/uL (ref 0.0–0.4)
Eos: 3 %
Hematocrit: 40.3 % (ref 34.0–46.6)
Hemoglobin: 13.2 g/dL (ref 11.1–15.9)
Immature Grans (Abs): 0 10*3/uL (ref 0.0–0.1)
Immature Granulocytes: 0 %
Lymphocytes Absolute: 2.3 10*3/uL (ref 0.7–3.1)
Lymphs: 45 %
MCH: 29.2 pg (ref 26.6–33.0)
MCHC: 32.8 g/dL (ref 31.5–35.7)
MCV: 89 fL (ref 79–97)
Monocytes Absolute: 0.4 10*3/uL (ref 0.1–0.9)
Monocytes: 8 %
Neutrophils Absolute: 2.2 10*3/uL (ref 1.4–7.0)
Neutrophils: 43 %
Platelets: 229 10*3/uL (ref 150–450)
RBC: 4.52 x10E6/uL (ref 3.77–5.28)
RDW: 12.6 % (ref 11.7–15.4)
WBC: 5 10*3/uL (ref 3.4–10.8)

## 2019-09-25 LAB — LIPID PANEL
Chol/HDL Ratio: 3.5 ratio (ref 0.0–4.4)
Cholesterol, Total: 202 mg/dL — ABNORMAL HIGH (ref 100–199)
HDL: 58 mg/dL (ref >39)
LDL Chol Calc (NIH): 125 mg/dL — ABNORMAL HIGH (ref 0–99)
Triglycerides: 106 mg/dL (ref 0–149)
VLDL Cholesterol Cal: 19 mg/dL (ref 5–40)

## 2019-09-25 LAB — HEPATITIS C ANTIBODY: Hep C Virus Ab: <0.1 s/co ratio (ref 0.0–0.9)

## 2019-09-25 LAB — TSH: TSH: 1.37 u[IU]/mL (ref 0.450–4.500)

## 2019-09-26 ENCOUNTER — Ambulatory Visit
Admission: RE | Admit: 2019-09-26 | Discharge: 2019-09-26 | Disposition: A | Discharge status: Home / Self Care | Payer: Medicare Other | Source: Ambulatory Visit | Attending: Physician Assistant | Admitting: Physician Assistant

## 2019-09-26 ENCOUNTER — Other Ambulatory Visit: Payer: Self-pay

## 2019-09-26 DIAGNOSIS — Z1231 Encounter for screening mammogram for malignant neoplasm of breast: Secondary | ICD-10-CM

## 2019-09-26 NOTE — Progress Notes (Signed)
Subjective:    Patient ID: Morgan Santos, female    DOB: 1951-03-12, 69 y.o.   MRN: [id number]  Chief Complaint  Patient presents with  . Well adult exam   She is here for a well check  Depression        This is a chronic problem.  The current episode started more than 1 year ago.   The problem has been gradually worsening since onset.  Associated symptoms include decreased concentration, fatigue, irritable, decreased interest and appetite change.     The symptoms are aggravated by family issues.  Past treatments include SSRIs - Selective serotonin reuptake inhibitors.  Past medical history includes anxiety.   Hyperlipidemia This is a chronic problem. The current episode started more than 1 year ago. The problem is controlled. Pertinent negatives include no chest pain. Current antihyperlipidemic treatment includes statins.  Anxiety Presents for follow-up visit. Symptoms include decreased concentration, excessive worry and nervous/anxious behavior. Patient reports no chest pain. Symptoms occur most days.       Past Medical History:  Diagnosis Date  . Anemia    when she was younger  . Anxiety   . Arthritis    hip- R, back; osteoarthritis  . Asthma   . Breast cancer ([facility])    right  . Complication of anesthesia   . Constipation   . Depression    pt. remarks that she "takes respridal because if I don't take it I get angry"   . GERD (gastroesophageal reflux disease)    pt. uses vinegar for heartburn, ususally once per week   . H/O degenerative disc disease   . Hyperlipidemia   . Hypertension    "when I get upset"  Not on medication  . Memory loss   . PONV (postoperative nausea and vomiting)    "just for one day"  . Stroke [facility]) ?2010 or 2011   2010, [facility] for stroke "mini stroke" short term memory problems since    Past Surgical History:  Procedure Laterality Date  . BREAST RECONSTRUCTION WITH PLACEMENT OF TISSUE EXPANDER AND FLEX HD (ACELLULAR HYDRATED  DERMIS) Right 07/12/2013   Procedure: RIGHT BREAST RECONSTRUCTION WITH PLACEMENT OF TISSUE EXPANDER AND FLEX HD TO RIGHT BREAST (ACELLULAR HYDRATED DERMIS);  Surgeon: Irene Limbo, MD;  Location: [place];  Service: Plastics;  Laterality: Right;  . BREAST SURGERY     for abcesses- removed from both breasts, many yrs. ago  . CHOLECYSTECTOMY    . COLONOSCOPY   01/29/2004   NUR:A 7-8 mm polyp snared from the hepatic flexure.  Another 3 mm polyp was     cold snared from the sigmoid colon.External hemorrhoids, possible source of recent rectal bleeding  . COLONOSCOPY N/A 06/05/2014   Procedure: COLONOSCOPY;  Surgeon: Danie Binder, MD;  [facility];  Service: Endoscopy;  Laterality: N/A;  1030 - moved to 10:45 - Ginger to notify pt   . COLONOSCOPY WITH PROPOFOL N/A 10/17/2017   Procedure: COLONOSCOPY WITH PROPOFOL;  Surgeon: Danie Binder, MD;  [facility];  Service: Endoscopy;  Laterality: N/A;  10:30am  . DILATION AND CURETTAGE OF UTERUS    . LUMBAR SPINE SURGERY    . MASTECTOMY Right   . MASTECTOMY W/ SENTINEL NODE BIOPSY Right 07/12/2013   Procedure: MASTECTOMY WITH SENTINEL LYMPH NODE BIOPSY;  Surgeon: Rolm Bookbinder, MD;  Location: [place];  Service: General;  Laterality: Right;  . MASTOPEXY Left 01/14/2014   Procedure: LEFT BREAST MASTOPEXY  FOR SYMMETRY ;  Surgeon: Irene Limbo, MD;  Location: [place];  Service: Plastics;  Laterality: Left;  . PORT-A-CATH REMOVAL Left 01/14/2014   Procedure: REMOVAL PORT-A-CATH;  Surgeon: Irene Limbo, MD;  Location: [place];  Service: Plastics;  Laterality: Left;  . PORTACATH PLACEMENT N/A 09/16/2013   Procedure: INSERTION PORT-A-CATH;  Surgeon: Rolm Bookbinder, MD;  Location: [place];  Service: General;  Laterality: N/A;  . REDUCTION MAMMAPLASTY Left   . REMOVAL OF BILATERAL TISSUE EXPANDERS WITH PLACEMENT OF BILATERAL BREAST IMPLANTS Right 01/14/2014   Procedure: REMOVAL OF RIGHT TISSUE  EXPANDERS WITH PLACEMENT OF PERMANENT BREAST IMPLANT;  Surgeon: Irene Limbo, MD;  Location: [place];  Service: Plastics;  Laterality: Right;  . TUBAL LIGATION    . VAGINAL HYSTERECTOMY      Family History  Problem Relation Age of Onset  . Hypertension Mother   . Diabetes type II Mother   . CVA Mother   . Heart disease Mother   . Osteoarthritis Mother   . Depression Mother   . Other Mother        degenerative disc disease  . Alcoholism Father   . Pneumonia Father   . Colon cancer Maternal Aunt        age 40s  . Colon cancer Other        maternal great aunt  . Colon polyps Neg Hx     Social History   Socioeconomic History  . Marital status: Married    Spouse name: Not on file  . Number of children: 4  . Years of education: 12th  . Highest education level: High school graduate  Occupational History  . Occupation: retired 2014  Tobacco Use  . Smoking status: Former Smoker    Packs/day: 0.25    Years: 2.00    Pack years: 0.50    Types: Cigarettes    Quit date: 09/14/1995    Years since quitting: 24.0  . Smokeless tobacco: Never Used  Substance and Sexual Activity  . Alcohol use: Yes    Comment: occ  . Drug use: No    Types: Marijuana    Comment: in the past  . Sexual activity: Yes  Other Topics Concern  . Not on file  Social History Narrative   Lives at home with her husband.   Right-handed.   1 cup coffee per day, occasional soda.   Social Determinants of Health   Financial Resource Strain:   . Difficulty of Paying Living Expenses: Not on file  Food Insecurity:   . Worried About Charity fundraiser in the Last Year: Not on file  . Ran Out of Food in the Last Year: Not on file  Transportation Needs:   . Lack of Transportation (Medical): Not on file  . Lack of Transportation (Non-Medical): Not on file  Physical Activity:   . Days of Exercise per Week: Not on file  . Minutes of Exercise per Session: Not on file  Stress:   . Feeling  of Stress : Not on file  Social Connections:   . Frequency of Communication with Friends and Family: Not on file  . Frequency of Social Gatherings with Friends and Family: Not on file  . Attends Religious Services: Not on file  . Active Member of Clubs or Organizations: Not on file  . Attends Archivist Meetings: Not on file  . Marital Status: Not on file  Intimate Partner Violence:   . Fear of  Current or Ex-Partner: Not on file  . Emotionally Abused: Not on file  . Physically Abused: Not on file  . Sexually Abused: Not on file    Outpatient Medications Prior to Visit  Medication Sig Dispense Refill  . anastrozole (ARIMIDEX) 1 MG tablet TAKE ONE (1) TABLET EACH DAY 90 tablet 3  . ARTIFICIAL TEAR OP Place 1 drop into both eyes daily as needed (dry eyes).    Marland Kitchen aspirin EC 81 MG tablet Take 81 mg by mouth every other day.    . budesonide-formoterol (SYMBICORT) 160-4.5 MCG/ACT inhaler Inhale 1 puff into the lungs 2 (two) times daily. (Patient taking differently: Inhale 1 puff into the lungs 2 (two) times daily as needed (shortness of breath). ) 1 Inhaler 3  . Calcium-Magnesium-Zinc 500-250-12.5 MG TABS Take 1 tablet by mouth daily.     . Cholecalciferol (VITAMIN D-3) 5000 UNITS TABS Take 10,000 Units by mouth daily.     . Cyanocobalamin (B-12) 2500 MCG SUBL Place 2,500 mcg under the tongue daily.    Marland Kitchen ALPRAZolam (XANAX) 0.5 MG tablet Take 1 tablet (0.5 mg total) by mouth 2 (two) times daily as needed. 60 tablet 0  . citalopram (CELEXA) 20 MG tablet Take 1 tablet (20 mg total) by mouth daily. 90 tablet 1  . meloxicam (MOBIC) 7.5 MG tablet TAKE ONE (1) TABLET EACH DAY 90 tablet 0  . pravastatin (PRAVACHOL) 80 MG tablet Take 1 tablet (80 mg total) by mouth daily. 90 tablet 1   No facility-administered medications prior to visit.    Allergies  Allergen Reactions  . Codeine Nausea And Vomiting  . Crestor [Rosuvastatin Calcium]     Muscle pain  . Lipitor [Atorvastatin]     Muscle  pain  . Namenda [Memantine Hcl]     Nightmares   . Peanut-Containing Drug Products     Peanut butter triggers asthma   . Multihance [Gadobenate] Nausea Only and Cough    PT HAD INCREASED MUCOUS PRODUCTION AND PHLEGMY COUGH LASTING 10 MINS AFTER INJECTION, PT SENT HOME WITH BENADRYL, PREV NAUSEA AFTER GAD    Review of Systems  Constitutional: Positive for appetite change and fatigue. Negative for activity change and fever.  HENT: Negative.   Eyes: Negative.   Respiratory: Negative.  Negative for cough.   Cardiovascular: Negative.  Negative for chest pain.  Gastrointestinal: Negative.  Negative for abdominal pain.  Endocrine: Negative.   Genitourinary: Negative.  Negative for dysuria.  Musculoskeletal: Negative.   Skin: Negative.   Neurological: Negative.   Psychiatric/Behavioral: Positive for decreased concentration and depression. The patient is nervous/anxious.        Objective:    Physical Exam Constitutional:      General: She is irritable. She is not in acute distress.    Appearance: Normal appearance. She is well-developed.  HENT:     Head: Normocephalic and atraumatic.  Cardiovascular:     Rate and Rhythm: Normal rate.  Pulmonary:     Effort: Pulmonary effort is normal.  Skin:    General: Skin is warm and dry.     Findings: No rash.  Neurological:     Mental Status: She is alert and oriented to person, place, and time.     Deep Tendon Reflexes: Reflexes are normal and symmetric.     BP (!) 150/84   Pulse (!) 59   Temp (!) 95.7 F (35.4 C)   Ht 5' 9.5" (1.765 m)   Wt 265 lb (120.2 kg)   SpO2 98%  BMI 38.57 kg/m  Wt Readings from Last 3 Encounters:  09/24/19 265 lb (120.2 kg)  06/03/19 256 lb 6.4 oz (116.3 kg)  09/25/18 246 lb (111.6 kg)    There are no preventive care reminders to display for this patient.  There are no preventive care reminders to display for this patient.   Lab Results  Component Value Date   TSH 1.370 09/24/2019   Lab  Results  Component Value Date   WBC 5.0 09/24/2019   HGB 13.2 09/24/2019   HCT 40.3 09/24/2019   MCV 89 09/24/2019   PLT 229 09/24/2019   Lab Results  Component Value Date   NA 140 09/24/2019   K 4.2 09/24/2019   CHLORIDE 106 12/09/2014   CO2 25 09/24/2019   GLUCOSE 93 09/24/2019   BUN 12 09/24/2019   CREATININE 0.71 09/24/2019   BILITOT 0.7 09/24/2019   ALKPHOS 137 (H) 09/24/2019   AST 21 09/24/2019   ALT 16 09/24/2019   PROT 6.9 09/24/2019   ALBUMIN 4.1 09/24/2019   CALCIUM 9.5 09/24/2019   ANIONGAP 12 (H) 12/09/2014   EGFR >90 12/09/2014   Lab Results  Component Value Date   CHOL 202 (H) 09/24/2019   Lab Results  Component Value Date   HDL 58 09/24/2019   Lab Results  Component Value Date   LDLCALC 125 (H) 09/24/2019   Lab Results  Component Value Date   TRIG 106 09/24/2019   Lab Results  Component Value Date   CHOLHDL 3.5 09/24/2019   No results found for: HGBA1C     Assessment & Plan:   Problem List Items Addressed This Visit      Other   Depression   Relevant Medications   citalopram (CELEXA) 10 MG tablet   ALPRAZolam (XANAX) 0.5 MG tablet   Hyperlipidemia   Relevant Medications   pravastatin (PRAVACHOL) 80 MG tablet    Other Visit Diagnoses    Well adult exam    -  Primary   Relevant Orders   CBC with Differential/Platelet (Completed)   CMP14+EGFR (Completed)   Lipid panel (Completed)   TSH (Completed)   Hepatitis C antibody (Completed)   Encounter for hepatitis C screening test for low risk patient       Relevant Orders   Hepatitis C antibody (Completed)   Anxiety       Relevant Medications   citalopram (CELEXA) 10 MG tablet   ALPRAZolam (XANAX) 0.5 MG tablet       Meds ordered this encounter  Medications  . citalopram (CELEXA) 10 MG tablet    Sig: Take 2 tablets (20 mg total) by mouth daily.    Dispense:  90 tablet    Refill:  3  . meloxicam (MOBIC) 7.5 MG tablet    Sig: TAKE ONE (1) TABLET EACH DAY    Dispense:  90  tablet    Refill:  1    Order Specific Question:   Supervising Provider    Answer:   Janora Norlander [[id number]]  . pravastatin (PRAVACHOL) 80 MG tablet    Sig: Take 1 tablet (80 mg total) by mouth daily.    Dispense:  90 tablet    Refill:  1    Order Specific Question:   Supervising Provider    Answer:   Janora Norlander [[id number]]  . ALPRAZolam (XANAX) 0.5 MG tablet    Sig: Take 1 tablet (0.5 mg total) by mouth 2 (two) times daily as needed.    Dispense:  60 tablet    Refill:  0    Order Specific Question:   Supervising Provider    Answer:   Janora Norlander [[id number]]     [name], PA-C   [name] PA-C [facility] [address]  [place], [place] [address] [phone]

## 2019-09-27 ENCOUNTER — Other Ambulatory Visit: Payer: Self-pay | Admitting: Physician Assistant

## 2019-09-27 DIAGNOSIS — N644 Mastodynia: Secondary | ICD-10-CM

## 2019-09-27 DIAGNOSIS — N63 Unspecified lump in unspecified breast: Secondary | ICD-10-CM

## 2019-09-29 ENCOUNTER — Other Ambulatory Visit: Payer: Self-pay | Admitting: Physician Assistant

## 2019-09-29 DIAGNOSIS — R002 Palpitations: Secondary | ICD-10-CM

## 2019-10-02 ENCOUNTER — Telehealth: Payer: Self-pay | Admitting: Physician Assistant

## 2019-10-03 ENCOUNTER — Other Ambulatory Visit: Payer: Self-pay | Admitting: Physician Assistant

## 2019-10-03 ENCOUNTER — Ambulatory Visit
Admission: RE | Admit: 2019-10-03 | Discharge: 2019-10-03 | Disposition: A | Discharge status: Home / Self Care | Payer: Medicare Other | Source: Ambulatory Visit | Attending: Physician Assistant | Admitting: Physician Assistant

## 2019-10-03 ENCOUNTER — Other Ambulatory Visit: Payer: Self-pay

## 2019-10-03 DIAGNOSIS — N63 Unspecified lump in unspecified breast: Secondary | ICD-10-CM

## 2019-10-03 DIAGNOSIS — N644 Mastodynia: Secondary | ICD-10-CM

## 2019-10-03 DIAGNOSIS — R928 Other abnormal and inconclusive findings on diagnostic imaging of breast: Secondary | ICD-10-CM | POA: Diagnosis not present

## 2019-10-03 DIAGNOSIS — Z9011 Acquired absence of right breast and nipple: Secondary | ICD-10-CM | POA: Diagnosis not present

## 2019-10-07 ENCOUNTER — Ambulatory Visit: Payer: Medicare Other | Admitting: Internal Medicine

## 2019-10-16 ENCOUNTER — Ambulatory Visit (INDEPENDENT_AMBULATORY_CARE_PROVIDER_SITE_OTHER): Payer: Medicare Other | Admitting: *Deleted

## 2019-10-16 DIAGNOSIS — Z0000 Encounter for general adult medical examination without abnormal findings: Secondary | ICD-10-CM | POA: Diagnosis not present

## 2019-10-16 NOTE — Progress Notes (Signed)
MEDICARE ANNUAL WELLNESS VISIT  10/16/2019  Telephone Visit Disclaimer This Medicare AWV was conducted by telephone due to national recommendations for restrictions regarding the COVID-19 Pandemic (e.g. social distancing).  I verified, using two identifiers, that I am speaking with Morgan Santos or their authorized healthcare agent. I discussed the limitations, risks, security, and privacy concerns of performing an evaluation and management service by telephone and the potential availability of an in-person appointment in the future. The patient expressed understanding and agreed to proceed.   Subjective:  Morgan Santos is a 69 y.o. female patient of Morgan Sleeper, PA-C who had a Medicare Annual Wellness Visit today via telephone. Morgan Santos is retired and lives with her husband Morgan Santos. She has 4 children and 3 of them are local. She reports that she is socially active and does interact with friends/family regularly but not as much as she would like due to the pandemic. She is minimally physically active. She walks her dogs daily and enjoys eating out.  Patient Care Team: Morgan Santos as PCP - General (General Practice) Morgan Kingfisher, MD as Consulting Physician (Hematology and Oncology) Morgan Binder, MD as Consulting Physician (Gastroenterology) Morgan Levo, MD as Consulting Physician (Neurology)  Advanced Directives 10/16/2019 10/10/2017 12/08/2016 06/11/2015 04/02/2015 03/26/2015 03/17/2015  Does Patient Have a Medical Advance Directive? Yes Yes No No No No No  Type of Advance Directive Out of facility DNR (pink MOST or yellow form);Healthcare Power of [place] in Chart? - Yes - - - - -  Would patient like information on creating a medical advance directive? - - - - No - patient declined information No - patient declined information No - patient declined information    Hospital Utilization Over the Past 12  Months: # of hospitalizations or ER visits: 0 # of surgeries: 0  Review of Systems    Patient reports that her overall health is unchanged compared to last year.  History obtained from the patient and patient chart.   Patient Reported Readings (BP, Pulse, CBG, Weight, etc) none  Pain Assessment Pain : No/denies pain     Current Medications & Allergies (verified) Allergies as of 10/16/2019      Reactions   Codeine Nausea And Vomiting   Crestor [rosuvastatin Calcium]    Muscle pain   Lipitor [atorvastatin]    Muscle pain   Namenda [memantine Hcl]    Nightmares    Peanut-containing Drug Products    Peanut butter triggers asthma    Multihance [gadobenate] Nausea Only, Cough   PT HAD INCREASED MUCOUS PRODUCTION AND PHLEGMY COUGH LASTING 10 MINS AFTER INJECTION, PT SENT HOME WITH BENADRYL, PREV NAUSEA AFTER GAD      Medication List       Accurate as of October 16, 2019  2:10 PM. If you have any questions, ask your nurse or doctor.        ALPRAZolam 0.5 MG tablet Commonly known as: XANAX Take 1 tablet (0.5 mg total) by mouth 2 (two) times daily as needed.   anastrozole 1 MG tablet Commonly known as: ARIMIDEX TAKE ONE (1) TABLET EACH DAY   ARTIFICIAL TEAR OP Place 1 drop into both eyes daily as needed (dry eyes).   aspirin EC 81 MG tablet Take 81 mg by mouth every other day.   B-12 2500 MCG Subl Place 2,500 mcg under the tongue daily.   budesonide-formoterol  160-4.5 MCG/ACT inhaler Commonly known as: SYMBICORT Inhale 1 puff into the lungs 2 (two) times daily.   Calcium-Magnesium-Zinc 500-250-12.5 MG Tabs Take 1 tablet by mouth daily.   citalopram 10 MG tablet Commonly known as: CELEXA Take 2 tablets (20 mg total) by mouth daily.   meloxicam 7.5 MG tablet Commonly known as: MOBIC TAKE ONE (1) TABLET EACH DAY   pravastatin 80 MG tablet Commonly known as: PRAVACHOL Take 1 tablet (80 mg total) by mouth daily.   Vitamin D-3 125 MCG (5000 UT) Tabs Take  10,000 Units by mouth daily.       History (reviewed): Past Medical History:  Diagnosis Date  . Anemia    when she was younger  . Anxiety   . Arthritis    hip- R, back; osteoarthritis  . Asthma   . Breast cancer ([place])    right  . Complication of anesthesia   . Constipation   . Depression    pt. remarks that she "takes respridal because if I don't take it I get angry"   . GERD (gastroesophageal reflux disease)    pt. uses vinegar for heartburn, ususally once per week   . H/O degenerative disc disease   . Hyperlipidemia   . Hypertension    "when I get upset"  Not on medication  . Memory loss   . PONV (postoperative nausea and vomiting)    "just for one day"  . Stroke [facility]) ?2010 or 2011   2010, [facility] for stroke "mini stroke" short term memory problems since   Past Surgical History:  Procedure Laterality Date  . BREAST RECONSTRUCTION WITH PLACEMENT OF TISSUE EXPANDER AND FLEX HD (ACELLULAR HYDRATED DERMIS) Right 07/12/2013   Procedure: RIGHT BREAST RECONSTRUCTION WITH PLACEMENT OF TISSUE EXPANDER AND FLEX HD TO RIGHT BREAST (ACELLULAR HYDRATED DERMIS);  Surgeon: Morgan Limbo, MD;  Location: [place];  Service: Plastics;  Laterality: Right;  . BREAST SURGERY     for abcesses- removed from both breasts, many yrs. ago  . CHOLECYSTECTOMY    . COLONOSCOPY   01/29/2004   NUR:A 7-8 mm polyp snared from the hepatic flexure.  Another 3 mm polyp was     cold snared from the sigmoid colon.External hemorrhoids, possible source of recent rectal bleeding  . COLONOSCOPY N/A 06/05/2014   Procedure: COLONOSCOPY;  Surgeon: Morgan Binder, MD;  [facility];  Service: Endoscopy;  Laterality: N/A;  1030 - moved to 10:45 - Morgan Santos to notify pt   . COLONOSCOPY WITH PROPOFOL N/A 10/17/2017   Procedure: COLONOSCOPY WITH PROPOFOL;  Surgeon: Morgan Binder, MD;  [facility];  Service: Endoscopy;  Laterality: N/A;  10:30am  . DILATION AND CURETTAGE OF UTERUS    .  LUMBAR SPINE SURGERY    . MASTECTOMY Right   . MASTECTOMY W/ SENTINEL NODE BIOPSY Right 07/12/2013   Procedure: MASTECTOMY WITH SENTINEL LYMPH NODE BIOPSY;  Surgeon: Morgan Bookbinder, MD;  Location: [place];  Service: General;  Laterality: Right;  . MASTOPEXY Left 01/14/2014   Procedure: LEFT BREAST MASTOPEXY FOR SYMMETRY ;  Surgeon: Morgan Limbo, MD;  Location: [place];  Service: Plastics;  Laterality: Left;  . PORT-A-CATH REMOVAL Left 01/14/2014   Procedure: REMOVAL PORT-A-CATH;  Surgeon: Morgan Limbo, MD;  Location: [place];  Service: Plastics;  Laterality: Left;  . PORTACATH PLACEMENT N/A 09/16/2013   Procedure: INSERTION PORT-A-CATH;  Surgeon: Morgan Bookbinder, MD;  Location: [place];  Service: General;  Laterality: N/A;  .  REDUCTION MAMMAPLASTY Left   . REMOVAL OF BILATERAL TISSUE EXPANDERS WITH PLACEMENT OF BILATERAL BREAST IMPLANTS Right 01/14/2014   Procedure: REMOVAL OF RIGHT TISSUE EXPANDERS WITH PLACEMENT OF PERMANENT BREAST IMPLANT;  Surgeon: Morgan Limbo, MD;  Location: [place];  Service: Plastics;  Laterality: Right;  . TUBAL LIGATION    . VAGINAL HYSTERECTOMY     Family History  Problem Relation Age of Onset  . Hypertension Mother   . Diabetes type II Mother   . CVA Mother   . Heart disease Mother   . Osteoarthritis Mother   . Depression Mother   . Other Mother        degenerative disc disease  . Alcoholism Father   . Pneumonia Father   . Colon cancer Maternal Aunt        age 61s  . Colon cancer Other        maternal great aunt  . Diabetes Sister   . Hyperlipidemia Sister   . Hypertension Sister   . Cancer Brother   . Diabetes Sister   . Hypertension Sister   . Diabetes Sister   . Hypertension Sister   . Diabetes Sister   . Kidney disease Sister   . Colon polyps Neg Hx    Social History   Socioeconomic History  . Marital status: Married    Spouse name: Morgan Santos  . Number of children: 4  .  Years of education: 12th  . Highest education level: High school graduate  Occupational History  . 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 Smoking status: Former Smoker    Packs/day: 0.25    Years: 2.00    Pack years: 0.50    Types: Cigarettes    Quit date: 09/14/1995    Years since quitting: 24.1  . Smokeless tobacco: Never Used  Substance and Sexual Activity  . Alcohol use: Yes    Comment: maybe 3 beers a year  . Drug use: No    Types: Marijuana    Comment: in the past  . Sexual activity: Yes  Other Topics Concern  . Not on file  Social History Narrative   Lives at home with her husband.   Right-handed.   1 cup coffee per day, occasional soda.   Social Determinants of Health   Financial Resource Strain:   . Difficulty of Paying Living Expenses: Not on file  Food Insecurity:   . Worried About Charity fundraiser in the Last Year: Not on file  . Ran Out of Food in the Last Year: Not on file  Transportation Needs:   . Lack of Transportation (Medical): Not on file  . Lack of Transportation (Non-Medical): Not on file  Physical Activity:   . Days of Exercise per Week: Not on file  . Minutes of Exercise per Session: Not on file  Stress:   . Feeling of Stress : Not on file  Social Connections:   . Frequency of Communication with Friends and Family: Not on file  . Frequency of Social Gatherings with Friends and Family: Not on file  . Attends Religious Services: Not on file  . Active Member of Clubs or Organizations: Not on file  . Attends Archivist Meetings: Not on file  . Marital Status: Not on file    Activities of Daily Living In your present state of health, do you have any difficulty performing the following activities: 10/16/2019  Hearing? N  Vision? N  Difficulty concentrating or making decisions? Y  Comment difficulty comprehending sometimes  Walking or climbing stairs? Y  Comment stairs difficult  Dressing or bathing? N    Doing errands, shopping? N  Preparing Food and eating ? N  Using the Toilet? N  In the past six months, have you accidently leaked urine? Y  Comment urgency  Do you have problems with loss of bowel control? N  Managing your Medications? N  Managing your Finances? N  Housekeeping or managing your Housekeeping? N  Some recent data might be hidden    Patient Education/ Literacy How often do you need to have someone help you when you read instructions, pamphlets, or other written materials from your doctor or pharmacy?: 1 - Never What is the last grade level you completed in school?: 12  Exercise Current Exercise Habits: Home exercise routine, Type of exercise: walking, Time (Minutes): 10, Frequency (Times/Week): 7, Weekly Exercise (Minutes/Week): 70, Intensity: Mild, Exercise limited by: None identified  Diet Patient reports consuming 2 meals a day and 3 snack(s) a day Patient reports that her primary diet is: Regular Patient reports that she does have regular access to food.   Depression Screen PHQ 2/9 Scores 10/16/2019 09/24/2019 06/03/2019 12/27/2017 08/24/2017 07/26/2017 03/13/2017  PHQ - 2 Score 1 5 0 0 2 4 1   PHQ- 9 Score 10 15 - - 7 15 -     Fall Risk Fall Risk  10/16/2019 10/16/2019 09/24/2019 06/03/2019 12/27/2017  Falls in the past year? 0 0 0 0 Yes  Number falls in past yr: - - - - 2 or more  Injury with Fall? - - - - Yes  Risk Factor Category  - - - - High Fall Risk     Objective:  Morgan Santos seemed alert and oriented and she participated appropriately during our telephone visit.  Blood Pressure Weight BMI  BP Readings from Last 3 Encounters:  09/24/19 (!) 150/84  06/03/19 128/77  09/25/18 (!) 142/74   Wt Readings from Last 3 Encounters:  09/24/19 265 lb (120.2 kg)  06/03/19 256 lb 6.4 oz (116.3 kg)  09/25/18 246 lb (111.6 kg)   BMI Readings from Last 1 Encounters:  09/24/19 38.57 kg/m    *Unable to obtain current vital signs, weight, and BMI due to telephone  visit type  Hearing/Vision  . Morgan Santos did not seem to have difficulty with hearing/understanding during the telephone conversation . Reports that she has not had a formal eye exam by an eye care professional within the past year . Reports that she has not had a formal hearing evaluation within the past year *Unable to fully assess hearing and vision during telephone visit type  Cognitive Function: 6CIT Screen 10/16/2019  What Year? 0 points  What month? 0 points  What time? 0 points  Count back from 20 0 points  Months in reverse 2 points  Repeat phrase 6 points  Total Score 8   (Normal:0-7, Significant for Dysfunction: >8)  Normal Cognitive Function Screening: No   Immunization & Health Maintenance Record Immunization History  Administered Date(s) Administered  . Td 09/06/2001    Health Maintenance  Topic Date Due  . INFLUENZA VACCINE  11/20/2019 (Originally 03/23/2019)  . TETANUS/TDAP  06/02/2020 (Originally 09/07/2011)  . PNA vac Low Risk Adult (1 of 2 - PCV13) 06/02/2020 (Originally 11/20/2015)  . MAMMOGRAM  09/20/2020  . COLONOSCOPY  10/18/2027  . DEXA SCAN  Completed  . Hepatitis C Screening  Completed  Assessment  This is a routine wellness examination for Morgan Santos.  Health Maintenance: Due or Overdue There are no preventive care reminders to display for this patient.  Morgan Santos does not need a referral for Community Assistance: Care Management:   no Social Work:    no Prescription Assistance:  no Nutrition/Diabetes Education:  no   Plan:  Personalized Goals Goals Addressed            This Visit's Progress   . Patient Stated       10/16/2019 AWV Goal: Exercise for General Health   Patient will verbalize understanding of the benefits of increased physical activity:  Exercising regularly is important. It will improve your overall fitness, flexibility, and endurance.  Regular exercise also will improve your overall health. It can help  you control your weight, reduce stress, and improve your bone density.  Over the next year, patient will increase physical activity as tolerated with a goal of at least 150 minutes of moderate physical activity per week.   You can tell that you are exercising at a moderate intensity if your heart starts beating faster and you start breathing faster but can still hold a conversation.  Moderate-intensity exercise ideas include:  Walking 1 mile (1.6 km) in about 15 minutes  Biking  Hiking  Golfing  Dancing  Water aerobics  Patient will verbalize understanding of everyday activities that increase physical activity by providing examples like the following: ? Yard work, such as: ? Pushing a Conservation officer, nature ? Raking and bagging leaves ? Washing your car ? Pushing a stroller ? Shoveling snow ? Gardening ? Washing windows or floors  Patient will be able to explain general safety guidelines for exercising:   Before you start a new exercise program, talk with your health care provider.  Do not exercise so much that you hurt yourself, feel dizzy, or get very short of breath.  Wear comfortable clothes and wear shoes with good support.  Drink plenty of water while you exercise to prevent dehydration or heat stroke.  Work out until your breathing and your heartbeat get faster.       Personalized Health Maintenance & Screening Recommendations  up to date  Lung Cancer Screening Recommended: no (Low Dose CT Chest recommended if Age 73-80 years, 30 pack-year currently smoking OR have quit w/in past 15 years) Hepatitis C Screening recommended: no HIV Screening recommended: no  Advanced Directives: Written information was not prepared per patient's request.  Referrals & Orders No orders of the defined types were placed in this encounter.   Follow-up Plan . Follow-up with Morgan Sleeper, PA-C as planned .  .    I have personally reviewed and noted the following in the patient's  chart:   . Medical and social history . Use of alcohol, tobacco or illicit drugs  . Current medications and supplements . Functional ability and status . Nutritional status . Physical activity . Advanced directives . List of other physicians . Hospitalizations, surgeries, and ER visits in previous 12 months . Vitals . Screenings to include cognitive, depression, and falls . Referrals and appointments  In addition, I have reviewed and discussed with Morgan Santos certain preventive protocols, quality metrics, and best practice recommendations. A written personalized care plan for preventive services as well as general preventive health recommendations is available and can be mailed to the patient at her request.      Baldomero Lamy, LPN  [phone]

## 2019-11-04 ENCOUNTER — Other Ambulatory Visit: Payer: Self-pay

## 2019-11-04 ENCOUNTER — Encounter: Payer: Self-pay | Admitting: Cardiovascular Disease

## 2019-11-04 ENCOUNTER — Ambulatory Visit (INDEPENDENT_AMBULATORY_CARE_PROVIDER_SITE_OTHER): Payer: Medicare Other | Admitting: Cardiovascular Disease

## 2019-11-04 VITALS — BP 140/82 | HR 68 | Ht 69.5 in | Wt 267.2 lb

## 2019-11-04 DIAGNOSIS — E7800 Pure hypercholesterolemia, unspecified: Secondary | ICD-10-CM

## 2019-11-04 DIAGNOSIS — R002 Palpitations: Secondary | ICD-10-CM

## 2019-11-04 DIAGNOSIS — R030 Elevated blood-pressure reading, without diagnosis of hypertension: Secondary | ICD-10-CM

## 2019-11-04 NOTE — Patient Instructions (Addendum)
Medication Instructions:  Continue all current medications.  Labwork: none  Testing/Procedures:  Your physician has recommended that you wear a 30 day event monitor. Event monitors are medical devices that record the heart's electrical activity. Doctors most often [place] these monitors to diagnose arrhythmias. Arrhythmias are problems with the speed or rhythm of the heartbeat. The monitor is a small, portable device. You can wear one while you do your normal daily activities. This is usually used to diagnose what is causing palpitations/syncope (passing out).  Office will contact with results via phone or letter.    Follow-Up: 3 months   Any Other Special Instructions Will Be Listed Below (If Applicable).  If you need a refill on your cardiac medications before your next appointment, please call your pharmacy.

## 2019-11-04 NOTE — Progress Notes (Signed)
CARDIOLOGY CONSULT NOTE  Patient ID: Morgan Santos MRN: [id number] DOB/AGE: Nov 15, 1950 69 y.o.  Admit date: (Not on file) Primary Physician: [name], PA-C  Reason for Consultation: Palpitations  HPI: Morgan Santos is a 69 y.o. female who is being seen today for the evaluation of palpitations at the request of [name], PA-C.   Past medical history includes anxiety and depression, hyperlipidemia, and breast cancer.  I reviewed labs dated 09/24/2019 which include normal TSH, total cholesterol, mildly elevated alkaline phosphatase (normal renal function and liver transaminases), and normal CBC.  Impression reviewed ECG performed on 10/10/2017 which demonstrated sinus rhythm with left bundle branch block.  This was also seen by the ECG which I personally reviewed dated 09/13/2013.  She has been experiencing palpitations since the end of 2020.  There is seldom in occurrence.  They last only seconds.  She primarily notices them when she lies down at night and when lying down on her left side along her left arm.  She has had some wheezing with exertion and has a history of asthma.  She denies exertional chest pain, orthopnea, paroxysmal nocturnal dyspnea, and leg swelling.  When they occur she takes Xanax and this helps her sleep.  She has not been awoken by palpitations.  Allergies  Allergen Reactions  . Codeine Nausea And Vomiting  . Crestor [Rosuvastatin Calcium]     Muscle pain  . Lipitor [Atorvastatin]     Muscle pain  . Namenda [Memantine Hcl]     Nightmares   . Peanut-Containing Drug Products     Peanut butter triggers asthma   . Multihance [Gadobenate] Nausea Only and Cough    PT HAD INCREASED MUCOUS PRODUCTION AND PHLEGMY COUGH LASTING 10 MINS AFTER INJECTION, PT SENT HOME WITH BENADRYL, PREV NAUSEA AFTER GAD    Current Outpatient Medications  Medication Sig Dispense Refill  . ALPRAZolam (XANAX) 0.5 MG tablet Take 1 tablet (0.5 mg total) by mouth 2 (two)  times daily as needed. 60 tablet 0  . anastrozole (ARIMIDEX) 1 MG tablet TAKE ONE (1) TABLET EACH DAY 90 tablet 3  . ARTIFICIAL TEAR OP Place 1 drop into both eyes daily as needed (dry eyes).    Marland Kitchen aspirin EC 81 MG tablet Take 81 mg by mouth every other day.    . budesonide-formoterol (SYMBICORT) 160-4.5 MCG/ACT inhaler Inhale 1 puff into the lungs 2 (two) times daily. 1 Inhaler 3  . Calcium-Magnesium-Zinc 500-250-12.5 MG TABS Take 1 tablet by mouth daily.     . Cholecalciferol (VITAMIN D-3) 5000 UNITS TABS Take 10,000 Units by mouth daily.     . citalopram (CELEXA) 10 MG tablet Take 2 tablets (20 mg total) by mouth daily. 90 tablet 3  . Cyanocobalamin (B-12) 2500 MCG SUBL Place 2,500 mcg under the tongue daily.    . meloxicam (MOBIC) 7.5 MG tablet TAKE ONE (1) TABLET EACH DAY 90 tablet 1  . pravastatin (PRAVACHOL) 80 MG tablet Take 1 tablet (80 mg total) by mouth daily. 90 tablet 1   No current facility-administered medications for this visit.    Past Medical History:  Diagnosis Date  . Anemia    when she was younger  . Anxiety   . Arthritis    hip- R, back; osteoarthritis  . Asthma   . Breast cancer ([place])    right  . Complication of anesthesia   . Constipation   . Depression    pt. remarks that she "takes  respridal because if I don't take it I get angry"   . GERD (gastroesophageal reflux disease)    pt. uses vinegar for heartburn, ususally once per week   . H/O degenerative disc disease   . Hyperlipidemia   . Hypertension    "when I get upset"  Not on medication  . Memory loss   . PONV (postoperative nausea and vomiting)    "just for one day"  . Stroke [facility]) ?2010 or 2011   2010, [facility] for stroke "mini stroke" short term memory problems since    Past Surgical History:  Procedure Laterality Date  . BREAST RECONSTRUCTION WITH PLACEMENT OF TISSUE EXPANDER AND FLEX HD (ACELLULAR HYDRATED DERMIS) Right 07/12/2013   Procedure: RIGHT BREAST RECONSTRUCTION WITH  PLACEMENT OF TISSUE EXPANDER AND FLEX HD TO RIGHT BREAST (ACELLULAR HYDRATED DERMIS);  Surgeon: Irene Limbo, MD;  Location: [place];  Service: Plastics;  Laterality: Right;  . BREAST SURGERY     for abcesses- removed from both breasts, many yrs. ago  . CHOLECYSTECTOMY    . COLONOSCOPY   01/29/2004   NUR:A 7-8 mm polyp snared from the hepatic flexure.  Another 3 mm polyp was     cold snared from the sigmoid colon.External hemorrhoids, possible source of recent rectal bleeding  . COLONOSCOPY N/A 06/05/2014   Procedure: COLONOSCOPY;  Surgeon: Danie Binder, MD;  [facility];  Service: Endoscopy;  Laterality: N/A;  1030 - moved to 10:45 - Ginger to notify pt   . COLONOSCOPY WITH PROPOFOL N/A 10/17/2017   Procedure: COLONOSCOPY WITH PROPOFOL;  Surgeon: Danie Binder, MD;  [facility];  Service: Endoscopy;  Laterality: N/A;  10:30am  . DILATION AND CURETTAGE OF UTERUS    . LUMBAR SPINE SURGERY    . MASTECTOMY Right   . MASTECTOMY W/ SENTINEL NODE BIOPSY Right 07/12/2013   Procedure: MASTECTOMY WITH SENTINEL LYMPH NODE BIOPSY;  Surgeon: Rolm Bookbinder, MD;  Location: [place];  Service: General;  Laterality: Right;  . MASTOPEXY Left 01/14/2014   Procedure: LEFT BREAST MASTOPEXY FOR SYMMETRY ;  Surgeon: Irene Limbo, MD;  Location: [place];  Service: Plastics;  Laterality: Left;  . PORT-A-CATH REMOVAL Left 01/14/2014   Procedure: REMOVAL PORT-A-CATH;  Surgeon: Irene Limbo, MD;  Location: [place];  Service: Plastics;  Laterality: Left;  . PORTACATH PLACEMENT N/A 09/16/2013   Procedure: INSERTION PORT-A-CATH;  Surgeon: Rolm Bookbinder, MD;  Location: [place];  Service: General;  Laterality: N/A;  . REDUCTION MAMMAPLASTY Left   . REMOVAL OF BILATERAL TISSUE EXPANDERS WITH PLACEMENT OF BILATERAL BREAST IMPLANTS Right 01/14/2014   Procedure: REMOVAL OF RIGHT TISSUE EXPANDERS WITH PLACEMENT OF PERMANENT BREAST IMPLANT;  Surgeon: Irene Limbo, MD;  Location: [place];  Service: Plastics;  Laterality: Right;  . TUBAL LIGATION    . VAGINAL HYSTERECTOMY      Social History   Socioeconomic History  . Marital status: Married    Spouse name: [name]  . Number of children: 4  . Years of education: 12th  . Highest education level: High school graduate  Occupational History  . 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 Smoking status: Former Smoker    Packs/day: 0.25    Years: 2.00    Pack years: 0.50    Types: Cigarettes    Quit date: 09/14/1995    Years since quitting: 24.1  . Smokeless tobacco: Never Used  Substance and Sexual  Activity  . Alcohol use: Yes    Comment: maybe 3 beers a year  . Drug use: No    Types: Marijuana    Comment: in the past  . Sexual activity: Yes  Other Topics Concern  . Not on file  Social History Narrative   Lives at home with her husband.   Right-handed.   1 cup coffee per day, occasional soda.   Social Determinants of Health   Financial Resource Strain:   . Difficulty of Paying Living Expenses:   Food Insecurity:   . Worried About Charity fundraiser in the Last Year:   . Arboriculturist in the Last Year:   Transportation Needs:   . Film/video editor (Medical):   Marland Kitchen Lack of Transportation (Non-Medical):   Physical Activity:   . Days of Exercise per Week:   . Minutes of Exercise per Session:   Stress:   . Feeling of Stress :   Social Connections:   . Frequency of Communication with Friends and Family:   . Frequency of Social Gatherings with Friends and Family:   . Attends Religious Services:   . Active Member of Clubs or Organizations:   . Attends Archivist Meetings:   Marland Kitchen Marital Status:   Intimate Partner Violence:   . Fear of Current or Ex-Partner:   . Emotionally Abused:   Marland Kitchen Physically Abused:   . Sexually Abused:      No family history of premature CAD in 1st degree relatives.  Current Meds   Medication Sig  . ALPRAZolam (XANAX) 0.5 MG tablet Take 1 tablet (0.5 mg total) by mouth 2 (two) times daily as needed.  Marland Kitchen anastrozole (ARIMIDEX) 1 MG tablet TAKE ONE (1) TABLET EACH DAY  . ARTIFICIAL TEAR OP Place 1 drop into both eyes daily as needed (dry eyes).  Marland Kitchen aspirin EC 81 MG tablet Take 81 mg by mouth every other day.  . budesonide-formoterol (SYMBICORT) 160-4.5 MCG/ACT inhaler Inhale 1 puff into the lungs 2 (two) times daily.  . Calcium-Magnesium-Zinc 500-250-12.5 MG TABS Take 1 tablet by mouth daily.   . Cholecalciferol (VITAMIN D-3) 5000 UNITS TABS Take 10,000 Units by mouth daily.   . citalopram (CELEXA) 10 MG tablet Take 2 tablets (20 mg total) by mouth daily.  . Cyanocobalamin (B-12) 2500 MCG SUBL Place 2,500 mcg under the tongue daily.  . meloxicam (MOBIC) 7.5 MG tablet TAKE ONE (1) TABLET EACH DAY  . pravastatin (PRAVACHOL) 80 MG tablet Take 1 tablet (80 mg total) by mouth daily.      Review of systems complete and found to be negative unless listed above in HPI   [facility], LPN was present throughout the entirety of the encounter.  Physical exam Blood pressure 140/82, pulse 68, height 5' 9.5" (1.765 m), weight 267 lb 3.2 oz (121.2 kg), SpO2 98 %. General: NAD Neck: No JVD, no thyromegaly or thyroid nodule.  Lungs: Clear to auscultation bilaterally with normal respiratory effort. CV: Nondisplaced PMI. Regular rate and rhythm, normal S1/S2, no S3/S4, no murmur.  No peripheral edema.  No carotid bruit.    Abdomen: Soft, nontender, no distention.  Skin: Intact without lesions or rashes.  Neurologic: Alert and oriented x 3.  Psych: Normal affect. Extremities: No clubbing or cyanosis.  HEENT: Normal.   ECG: Most recent ECG reviewed.   Labs: Lab Results  Component Value Date/Time   K 4.2 09/24/2019 12:52 PM   K 4.1 12/09/2014 01:17 PM   BUN  12 09/24/2019 12:52 PM   BUN 15.3 12/09/2014 01:17 PM   CREATININE 0.71 09/24/2019 12:52 PM   CREATININE 0.7 12/09/2014  01:17 PM   ALT 16 09/24/2019 12:52 PM   ALT 25 12/09/2014 01:17 PM   TSH 1.370 09/24/2019 12:52 PM   HGB 13.2 09/24/2019 12:52 PM   HGB 13.8 12/09/2014 01:16 PM     Lipids: Lab Results  Component Value Date/Time   LDLCALC 125 (H) 09/24/2019 12:52 PM   CHOL 202 (H) 09/24/2019 12:52 PM   TRIG 106 09/24/2019 12:52 PM   HDL 58 09/24/2019 12:52 PM      Echocardiogram 08/16/2018:  - Left ventricle: The cavity size was normal. Systolic function was  normal. The estimated ejection fraction was in the range of 50%  to 55%. Wall motion was normal; there were no regional wall  motion abnormalities. Doppler parameters are consistent with  abnormal left ventricular relaxation (grade 1 diastolic  dysfunction).  - Aortic valve: Transvalvular velocity was within the normal range.  There was no stenosis. There was no regurgitation.  - Mitral valve: Transvalvular velocity was within the normal range.  There was no evidence for stenosis. There was trivial  regurgitation.  - Left atrium: The atrium was normal in size.  - Right ventricle: The cavity size was normal. Wall thickness was  normal. Systolic function was normal. RV systolic pressure (S,  est): 32 mm Hg.  - Right atrium: The atrium was normal in size.  - Atrial septum: No defect or patent foramen ovale was identified.  - Tricuspid valve: There was trivial regurgitation.  - Pulmonary arteries: Systolic pressure was mildly increased. PA  peak pressure: 32 mm Hg (S).  - Inferior vena cava: The vessel was normal in size. The  respirophasic diameter changes were in the normal range (>= 50%),  consistent with normal central venous pressure.  - Pericardium, extracardiac: There was no pericardial effusion.   Impressions:   - No cardiac source of emboli was identified.    ASSESSMENT AND PLAN:   1.  Palpitations: I will obtain a 30-day event monitor.  Echocardiogram from December 2019 reviewed above and was  unremarkable.  No obvious metabolic derangements as per lab review noted above.  2.  Elevated blood pressure: She does not carry diagnosis of hypertension.  Blood pressure is mildly elevated.  This will need further monitoring.  3.  Hyperlipidemia: On statin therapy.    Disposition: Follow up in 3 months virtual visit  Signed: Kate Sable, M.D., F.A.C.C.  11/04/2019, 1:45 PM

## 2019-11-14 DIAGNOSIS — M9904 Segmental and somatic dysfunction of sacral region: Secondary | ICD-10-CM | POA: Diagnosis not present

## 2019-11-14 DIAGNOSIS — M47816 Spondylosis without myelopathy or radiculopathy, lumbar region: Secondary | ICD-10-CM | POA: Diagnosis not present

## 2019-11-14 DIAGNOSIS — M9903 Segmental and somatic dysfunction of lumbar region: Secondary | ICD-10-CM | POA: Diagnosis not present

## 2019-11-14 DIAGNOSIS — M9902 Segmental and somatic dysfunction of thoracic region: Secondary | ICD-10-CM | POA: Diagnosis not present

## 2019-11-14 DIAGNOSIS — M545 Low back pain: Secondary | ICD-10-CM | POA: Diagnosis not present

## 2019-11-15 ENCOUNTER — Ambulatory Visit (INDEPENDENT_AMBULATORY_CARE_PROVIDER_SITE_OTHER): Payer: Medicare Other

## 2019-11-15 DIAGNOSIS — R002 Palpitations: Secondary | ICD-10-CM | POA: Diagnosis not present

## 2019-11-19 DIAGNOSIS — M47816 Spondylosis without myelopathy or radiculopathy, lumbar region: Secondary | ICD-10-CM | POA: Diagnosis not present

## 2019-11-19 DIAGNOSIS — M545 Low back pain: Secondary | ICD-10-CM | POA: Diagnosis not present

## 2019-11-19 DIAGNOSIS — M9904 Segmental and somatic dysfunction of sacral region: Secondary | ICD-10-CM | POA: Diagnosis not present

## 2019-11-19 DIAGNOSIS — M9903 Segmental and somatic dysfunction of lumbar region: Secondary | ICD-10-CM | POA: Diagnosis not present

## 2019-11-19 DIAGNOSIS — M9902 Segmental and somatic dysfunction of thoracic region: Secondary | ICD-10-CM | POA: Diagnosis not present

## 2019-11-21 DIAGNOSIS — M9904 Segmental and somatic dysfunction of sacral region: Secondary | ICD-10-CM | POA: Diagnosis not present

## 2019-11-21 DIAGNOSIS — M9902 Segmental and somatic dysfunction of thoracic region: Secondary | ICD-10-CM | POA: Diagnosis not present

## 2019-11-21 DIAGNOSIS — M47816 Spondylosis without myelopathy or radiculopathy, lumbar region: Secondary | ICD-10-CM | POA: Diagnosis not present

## 2019-11-21 DIAGNOSIS — M545 Low back pain: Secondary | ICD-10-CM | POA: Diagnosis not present

## 2019-11-21 DIAGNOSIS — M9903 Segmental and somatic dysfunction of lumbar region: Secondary | ICD-10-CM | POA: Diagnosis not present

## 2019-12-03 DIAGNOSIS — M47816 Spondylosis without myelopathy or radiculopathy, lumbar region: Secondary | ICD-10-CM | POA: Diagnosis not present

## 2019-12-03 DIAGNOSIS — M9902 Segmental and somatic dysfunction of thoracic region: Secondary | ICD-10-CM | POA: Diagnosis not present

## 2019-12-03 DIAGNOSIS — M545 Low back pain: Secondary | ICD-10-CM | POA: Diagnosis not present

## 2019-12-03 DIAGNOSIS — M9903 Segmental and somatic dysfunction of lumbar region: Secondary | ICD-10-CM | POA: Diagnosis not present

## 2019-12-03 DIAGNOSIS — M9904 Segmental and somatic dysfunction of sacral region: Secondary | ICD-10-CM | POA: Diagnosis not present

## 2019-12-05 ENCOUNTER — Telehealth: Payer: Self-pay | Admitting: Physician Assistant

## 2019-12-11 DIAGNOSIS — M545 Low back pain: Secondary | ICD-10-CM | POA: Diagnosis not present

## 2019-12-11 DIAGNOSIS — M9904 Segmental and somatic dysfunction of sacral region: Secondary | ICD-10-CM | POA: Diagnosis not present

## 2019-12-11 DIAGNOSIS — M9902 Segmental and somatic dysfunction of thoracic region: Secondary | ICD-10-CM | POA: Diagnosis not present

## 2019-12-11 DIAGNOSIS — M9903 Segmental and somatic dysfunction of lumbar region: Secondary | ICD-10-CM | POA: Diagnosis not present

## 2019-12-11 DIAGNOSIS — M47816 Spondylosis without myelopathy or radiculopathy, lumbar region: Secondary | ICD-10-CM | POA: Diagnosis not present

## 2019-12-18 DIAGNOSIS — M9902 Segmental and somatic dysfunction of thoracic region: Secondary | ICD-10-CM | POA: Diagnosis not present

## 2019-12-18 DIAGNOSIS — M9904 Segmental and somatic dysfunction of sacral region: Secondary | ICD-10-CM | POA: Diagnosis not present

## 2019-12-18 DIAGNOSIS — M9903 Segmental and somatic dysfunction of lumbar region: Secondary | ICD-10-CM | POA: Diagnosis not present

## 2019-12-18 DIAGNOSIS — M47816 Spondylosis without myelopathy or radiculopathy, lumbar region: Secondary | ICD-10-CM | POA: Diagnosis not present

## 2019-12-18 DIAGNOSIS — M545 Low back pain: Secondary | ICD-10-CM | POA: Diagnosis not present

## 2019-12-26 ENCOUNTER — Other Ambulatory Visit: Payer: Self-pay

## 2020-01-08 ENCOUNTER — Other Ambulatory Visit: Payer: Self-pay | Admitting: *Deleted

## 2020-01-08 DIAGNOSIS — E785 Hyperlipidemia, unspecified: Secondary | ICD-10-CM

## 2020-01-08 MED ORDER — PRAVASTATIN SODIUM 80 MG PO TABS: 80.0000 mg | ORAL_TABLET | Freq: Every day | ORAL | 0 refills | Status: DC

## 2020-01-14 ENCOUNTER — Telehealth: Payer: Self-pay | Admitting: *Deleted

## 2020-01-14 ENCOUNTER — Other Ambulatory Visit: Payer: Self-pay | Admitting: *Deleted

## 2020-01-14 DIAGNOSIS — F331 Major depressive disorder, recurrent, moderate: Secondary | ICD-10-CM

## 2020-01-14 MED ORDER — CITALOPRAM HYDROBROMIDE 10 MG PO TABS: 20.0000 mg | ORAL_TABLET | Freq: Every day | ORAL | 0 refills | Status: DC

## 2020-01-14 MED ORDER — MELOXICAM 7.5 MG PO TABS: ORAL_TABLET | ORAL | 0 refills | Status: DC

## 2020-01-14 MED ORDER — CITALOPRAM HYDROBROMIDE 10 MG PO TABS: ORAL_TABLET | ORAL | 1 refills | Status: DC

## 2020-01-14 MED ORDER — ANASTROZOLE 1 MG PO TABS: ORAL_TABLET | ORAL | 3 refills | Status: DC

## 2020-01-14 NOTE — Telephone Encounter (Signed)
We received PA for celexa 10mg  BID due to quanity. Can it be changed to celexa 20mg  daily for insurance to pay?

## 2020-01-14 NOTE — Telephone Encounter (Signed)
Left detailed message on patients voicemail and also sent new rx to pharmacy with new directions.

## 2020-01-14 NOTE — Telephone Encounter (Signed)
I think that's fine.  Have patient take 1/2 tablet BID of the 20mg  if she insists on BID dosing.

## 2020-01-21 ENCOUNTER — Other Ambulatory Visit: Payer: Self-pay

## 2020-01-21 DIAGNOSIS — F419 Anxiety disorder, unspecified: Secondary | ICD-10-CM

## 2020-01-21 DIAGNOSIS — F331 Major depressive disorder, recurrent, moderate: Secondary | ICD-10-CM

## 2020-01-21 MED ORDER — CITALOPRAM HYDROBROMIDE 10 MG PO TABS: ORAL_TABLET | ORAL | 0 refills | Status: DC

## 2020-01-21 MED ORDER — ANASTROZOLE 1 MG PO TABS: ORAL_TABLET | ORAL | 0 refills | Status: DC

## 2020-01-21 MED ORDER — MELOXICAM 7.5 MG PO TABS: ORAL_TABLET | ORAL | 0 refills | Status: DC

## 2020-01-22 ENCOUNTER — Telehealth: Payer: Self-pay | Admitting: *Deleted

## 2020-01-22 MED ORDER — CITALOPRAM HYDROBROMIDE 20 MG PO TABS: 20.0000 mg | ORAL_TABLET | Freq: Every day | ORAL | 0 refills | Status: DC

## 2020-01-22 NOTE — Telephone Encounter (Signed)
Has an appointment 01-24-20 with A. Gottschalk as new pcp.  Patient was taking celexa 10 mg , take two daily  and insurance did not want to pay , needed prior authorization.  Provider agreed to send in celexa 20 mg , take one pill daily or 20 mg ,take 1/2 bid .    Patient agrees to take  celexa 20 mg once daily.   Script was erroneously sent in as celexa 10 mg , take 1/2 bid. New script sent for celexa 20 mg daily.

## 2020-01-24 ENCOUNTER — Other Ambulatory Visit: Payer: Self-pay

## 2020-01-24 ENCOUNTER — Ambulatory Visit (INDEPENDENT_AMBULATORY_CARE_PROVIDER_SITE_OTHER): Payer: Medicare HMO | Admitting: Family Medicine

## 2020-01-24 VITALS — BP 134/67 | HR 69 | Temp 97.2°F | Ht 69.5 in | Wt 267.0 lb

## 2020-01-24 DIAGNOSIS — R0989 Other specified symptoms and signs involving the circulatory and respiratory systems: Secondary | ICD-10-CM | POA: Diagnosis not present

## 2020-01-24 DIAGNOSIS — Z853 Personal history of malignant neoplasm of breast: Secondary | ICD-10-CM | POA: Diagnosis not present

## 2020-01-24 DIAGNOSIS — F419 Anxiety disorder, unspecified: Secondary | ICD-10-CM | POA: Diagnosis not present

## 2020-01-24 DIAGNOSIS — Z79899 Other long term (current) drug therapy: Secondary | ICD-10-CM | POA: Diagnosis not present

## 2020-01-24 DIAGNOSIS — F331 Major depressive disorder, recurrent, moderate: Secondary | ICD-10-CM | POA: Diagnosis not present

## 2020-01-24 DIAGNOSIS — Z7689 Persons encountering health services in other specified circumstances: Secondary | ICD-10-CM

## 2020-01-24 DIAGNOSIS — J989 Respiratory disorder, unspecified: Secondary | ICD-10-CM

## 2020-01-24 MED ORDER — BUDESONIDE-FORMOTEROL FUMARATE 160-4.5 MCG/ACT IN AERO: 1.0000 | INHALATION_SPRAY | Freq: Two times a day (BID) | RESPIRATORY_TRACT | 3 refills | Status: DC

## 2020-01-24 MED ORDER — ALBUTEROL SULFATE HFA 108 (90 BASE) MCG/ACT IN AERS: 2.0000 | INHALATION_SPRAY | Freq: Four times a day (QID) | RESPIRATORY_TRACT | 0 refills | Status: DC | PRN

## 2020-01-24 MED ORDER — ALPRAZOLAM 0.5 MG PO TABS: 0.2500 mg | ORAL_TABLET | Freq: Two times a day (BID) | ORAL | 0 refills | Status: DC | PRN

## 2020-01-24 NOTE — Patient Instructions (Signed)
We talked about the risk of falls and development of dementia with Xanax.  Take ONLY if needed and as little as possible.  Controlled Substance Guidelines:  1. You cannot get an early refill, even it is lost.  2. You cannot get controlled medications from any other doctor, unless it is the emergency department and related to a new problem or injury.  3. You cannot use alcohol, marijuana, cocaine or any other recreational drugs while using this medication. This is very dangerous.  4. You are willing to have your urine drug tested at each visit.  5. You will not drive while using this medication, because that can put yourself and others in serious danger of an accident. 6. If any medication is stolen, then there must be a police report to verify it, or it cannot be refilled.  7. I will not prescribe these medications for longer than 3 months.  8. You must bring your pill bottle to each visit.  9. You must use the same pharmacy for all refills for the medication, unless you clear it with me beforehand.  10. You cannot share or sell this medication.

## 2020-01-24 NOTE — Progress Notes (Signed)
Subjective: CC: est care PCP: Janora Norlander, DO [id number] Morgan Santos is a 69 y.o. female presenting to clinic today for:  1.  History of breast cancer, depression, anxiety Patient reports rare use of alprazolam.  Last dose was 2 weeks ago.  She denies any excessive daytime sleepiness, falls, mental status changes.  She is been previously told that she had dementia but notes that when she established with a new neurologist she was in fact told that she does not have dementia.  She reports compliance with Celexa 20 mg daily, which she feels controls her depression quite well.  She has a history of breast cancer that is in remission.  She has 2 more years of Arimidex before going off of medicine.   ROS: Per HPI  Allergies  Allergen Reactions   Codeine Nausea And Vomiting   Crestor [Rosuvastatin Calcium]     Muscle pain   Lipitor [Atorvastatin]     Muscle pain   Namenda [Memantine Hcl]     Nightmares    Peanut-Containing Drug Products     Peanut butter triggers asthma    Multihance [Gadobenate] Nausea Only and Cough    PT HAD INCREASED MUCOUS PRODUCTION AND PHLEGMY COUGH LASTING 10 MINS AFTER INJECTION, PT SENT HOME WITH BENADRYL, PREV NAUSEA AFTER GAD   Past Medical History:  Diagnosis Date   Anemia    when she was younger   Anxiety    Arthritis    hip- R, back; osteoarthritis   Asthma    Breast cancer ([place])    right   Complication of anesthesia    Constipation    Depression    pt. remarks that she "takes respridal because if I don't take it I get angry"    GERD (gastroesophageal reflux disease)    pt. uses vinegar for heartburn, ususally once per week    H/O degenerative disc disease    Hyperlipidemia    Hypertension    "when I get upset"  Not on medication   Memory loss    PONV (postoperative nausea and vomiting)    "just for one day"   Stroke [facility]) ?2010 or 2011   2010, [facility] for stroke "mini stroke" short term memory problems  since    Current Outpatient Medications:    ALPRAZolam (XANAX) 0.5 MG tablet, Take 0.5-1 tablets (0.25-0.5 mg total) by mouth 2 (two) times daily as needed for anxiety., Disp: 60 tablet, Rfl: 0   anastrozole (ARIMIDEX) 1 MG tablet, TAKE ONE (1) TABLET EACH DAY, Disp: 90 tablet, Rfl: 0   ARTIFICIAL TEAR OP, Place 1 drop into both eyes daily as needed (dry eyes)., Disp: , Rfl:    aspirin EC 81 MG tablet, Take 81 mg by mouth every other day., Disp: , Rfl:    budesonide-formoterol (SYMBICORT) 160-4.5 MCG/ACT inhaler, Inhale 1 puff into the lungs 2 (two) times daily., Disp: 1 Inhaler, Rfl: 3   Calcium-Magnesium-Zinc 500-250-12.5 MG TABS, Take 1 tablet by mouth daily. , Disp: , Rfl:    Cholecalciferol (VITAMIN D-3) 5000 UNITS TABS, Take 10,000 Units by mouth daily. , Disp: , Rfl:    citalopram (CELEXA) 20 MG tablet, Take 1 tablet (20 mg total) by mouth daily., Disp: 90 tablet, Rfl: 0   Cyanocobalamin (B-12) 2500 MCG SUBL, Place 2,500 mcg under the tongue daily., Disp: , Rfl:    meloxicam (MOBIC) 7.5 MG tablet, TAKE ONE (1) TABLET EACH DAY, Disp: 90 tablet, Rfl: 0   pravastatin (PRAVACHOL) 80 MG tablet,  Take 1 tablet (80 mg total) by mouth daily., Disp: 90 tablet, Rfl: 0   albuterol (VENTOLIN HFA) 108 (90 Base) MCG/ACT inhaler, Inhale 2 puffs into the lungs every 6 (six) hours as needed for wheezing or shortness of breath. 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   Right-handed.   1 cup coffee per day, occasional soda.   Social Determinants of Health   Financial Resource Strain:    Difficulty of Paying Living Expenses:   Food Insecurity:    Worried About Charity fundraiser in the Last Year:    Arboriculturist in the Last Year:   Transportation Needs:    Film/video editor (Medical):    Lack of Transportation (Non-Medical):   Physical Activity:    Days of Exercise per Week:    Minutes of Exercise per Session:   Stress:    Feeling of Stress :   Social Connections:    Frequency of Communication with Friends and Family:    Frequency of Social Gatherings with Friends and Family:    Attends Religious Services:    Active Member of Clubs or Organizations:    Attends Music therapist:    Marital Status:   Intimate Partner Violence:    Fear of Current or Ex-Partner:    Emotionally Abused:    Physically Abused:    Sexually Abused:    Family History  Problem Relation Age of Onset   Hypertension Mother    Diabetes type II Mother    CVA Mother    Heart disease Mother    Osteoarthritis Mother    Depression Mother    Other Mother        degenerative disc disease   Alcoholism Father    Pneumonia Father    Colon cancer Maternal Aunt        age 59s   Colon cancer Other        maternal great aunt   Diabetes Sister    Hyperlipidemia Sister    Hypertension Sister    Cancer Brother    Diabetes Sister    Hypertension Sister    Diabetes Sister    Hypertension Sister    Diabetes Sister    Kidney disease Sister    Colon polyps Neg Hx     Objective: Office vital signs reviewed. BP 134/67    Pulse 69    Temp (!) 97.2 F (36.2 C) (Temporal)    Ht 5' 9.5"  (1.765 m)    Wt 267 lb (121.1 kg)  SpO2 96%    BMI 38.86 kg/m   Physical Examination:  General: Awake, alert, well nourished, No acute distress Cardio: regular rate and rhythm, S1S2 heard, no murmurs appreciated Pulm: clear to auscultation bilaterally, no wheezes, rhonchi or rales; normal work of breathing on room air Extremities: warm, well perfused, No edema, cyanosis or clubbing; +2 pulses bilaterally MSK: normal gait and station Psych: Mood stable, speech normal, affect appropriate, pleasant and interactive. Depression screen [facility] 2/9 01/24/2020 10/16/2019 09/24/2019  Decreased Interest 0 0 3  Down, Depressed, Hopeless 1 1 2   PHQ - 2 Score 1 1 5   Altered sleeping 1 0 0  Tired, decreased energy 1 0 3  Change in appetite 0 3 3  Feeling bad or failure about yourself  1 3 1   Trouble concentrating 1 3 3   Moving slowly or fidgety/restless 0 0 0  Suicidal thoughts 0 0 0  PHQ-9 Score 5 10 15   Difficult doing work/chores Somewhat difficult Somewhat difficult Somewhat difficult  Some recent data might be hidden   GAD 7 : Generalized Anxiety Score 01/24/2020 09/24/2019  Nervous, Anxious, on Edge 1 1  Control/stop worrying 1 1  Worry too much - different things 1 1  Trouble relaxing 1 0  Restless 0 0  Easily annoyed or irritable 0 3  Afraid - awful might happen 1 3  Total GAD 7 Score 5 9  Anxiety Difficulty - Somewhat difficult   Assessment/ Plan: 69 y.o. female   1. Moderate episode of recurrent major depressive disorder (HCC) Stable.  Continue current regimen with Celexa  2. Anxiety Stable with as needed use of alprazolam.  Medication has been renewed.  National narcotic database was reviewed and there were no red flags.  Rare refills - ToxASSURE Select 13 (MW), Urine - ALPRAZolam (XANAX) 0.5 MG tablet; Take 0.5-1 tablets (0.25-0.5 mg total) by mouth 2 (two) times daily as needed for anxiety.  Dispense: 60 tablet; Refill: 0  3. Controlled substance agreement signed UDS and CSC  completed per office policy - ToxASSURE Select 13 (MW), Urine  4. Establishing care with new doctor, encounter for  5. History of breast cancer Stable and in remission on Arimidex  6. Reactive airway disease that is not asthma I have renewed her Symbicort and encouraged her to use albuterol inhaler should she have allergy induced symptoms. - budesonide-formoterol (SYMBICORT) 160-4.5 MCG/ACT inhaler; Inhale 1 puff into the lungs 2 (two) times daily.  Dispense: 1 Inhaler; Refill: 3   Orders Placed This Encounter  Procedures   ToxASSURE Select 13 (MW), Urine   Meds ordered this encounter  Medications   ALPRAZolam (XANAX) 0.5 MG tablet    Sig: Take 0.5-1 tablets (0.25-0.5 mg total) by mouth 2 (two) times daily as needed for anxiety.    Dispense:  60 tablet    Refill:  0   budesonide-formoterol (SYMBICORT) 160-4.5 MCG/ACT inhaler    Sig: Inhale 1 puff into the lungs 2 (two) times daily.    Dispense:  1 Inhaler    Refill:  3   albuterol (VENTOLIN HFA) 108 (90 Base) MCG/ACT inhaler    Sig: Inhale 2 puffs into the lungs every 6 (six) hours as needed for wheezing or shortness of breath. (whichever is covered by ins)    Dispense:  18 g    Refill:  [place], [place] [phone]

## 2020-01-28 LAB — TOXASSURE SELECT 13 (MW), URINE

## 2020-02-27 ENCOUNTER — Ambulatory Visit: Payer: Medicare Other | Admitting: Cardiovascular Disease

## 2020-03-02 NOTE — Progress Notes (Signed)
Cardiology Office Note  Date: 03/03/2020   ID: Shirlean, Berman 02/13/1951, MRN [id number]  PCP:  Janora Norlander, DO  Cardiologist:  Rozann Lesches, MD Electrophysiologist:  None   Chief Complaint: Follow-up palpitations   History of Present Illness: Morgan Santos is a 69 y.o. female with a history of palpitations, hyperlipidemia, elevated BP without diagnosis of hypertension.  Last saw Dr. Bronson Ing 11/04/2019 for palpitations.  She had been experiencing palpitations since the end of 2020.  She stated they were seldom and recurrent lasting only seconds.  She primarily noticed them at night when lying down or on her left side along her left arm.  She admitted to some wheezing with exertion and history of asthma.  She denied any exertional chest pain, orthopnea, PND, or leg swelling.  Previous EKGs showed sinus rhythm with left bundle branch block.  Patient is here for follow-up.  States she is not having any recent palpitations since decreasing the amount of coffee, tea and soft drinks she was drinking in the past.  States she does have some increased dyspnea on exertion over the last few months.  She is attempting to start an exercise program but states she knows she is out of shape and overweight.  Blood pressure has been slightly elevated over the last 3 measurements..  Last echocardiogram showed grade 1 diastolic dysfunction with EF of 50 to 55%.  Past Medical History:  Diagnosis Date   Anemia    when she was younger   Anxiety    Arthritis    hip- R, back; osteoarthritis   Asthma    Breast cancer ([place])    right   Complication of anesthesia    Constipation    Depression    pt. remarks that she "takes respridal because if I don't take it I get angry"    GERD (gastroesophageal reflux disease)    pt. uses vinegar for heartburn, ususally once per week    H/O degenerative disc disease    Hyperlipidemia    Hypertension    "when I get upset"  Not on  medication   Memory loss    PONV (postoperative nausea and vomiting)    "just for one day"   Stroke [facility]) ?2010 or 2011   2010, [facility] for stroke "mini stroke" short term memory problems since    Past Surgical History:  Procedure Laterality Date   BREAST RECONSTRUCTION WITH PLACEMENT OF TISSUE EXPANDER AND FLEX HD (ACELLULAR HYDRATED DERMIS) Right 07/12/2013   Procedure: RIGHT BREAST RECONSTRUCTION WITH PLACEMENT OF TISSUE EXPANDER AND FLEX HD TO RIGHT BREAST (ACELLULAR HYDRATED DERMIS);  Surgeon: Irene Limbo, MD;  Location: [place];  Service: Plastics;  Laterality: Right;   BREAST SURGERY     for abcesses- removed from both breasts, many yrs. ago   CHOLECYSTECTOMY     COLONOSCOPY   01/29/2004   NUR:A 7-8 mm polyp snared from the hepatic flexure.  Another 3 mm polyp was     cold snared from the sigmoid colon.External hemorrhoids, possible source of recent rectal bleeding   COLONOSCOPY N/A 06/05/2014   Procedure: COLONOSCOPY;  Surgeon: Danie Binder, MD;  [facility];  Service: Endoscopy;  Laterality: N/A;  1030 - moved to 10:45 - Ginger to notify pt    COLONOSCOPY WITH PROPOFOL N/A 10/17/2017   Procedure: COLONOSCOPY WITH PROPOFOL;  Surgeon: Danie Binder, MD;  [facility];  Service: Endoscopy;  Laterality: N/A;  10:30am   DILATION  AND CURETTAGE OF UTERUS     LUMBAR SPINE SURGERY     MASTECTOMY Right    MASTECTOMY W/ SENTINEL NODE BIOPSY Right 07/12/2013   Procedure: MASTECTOMY WITH SENTINEL LYMPH NODE BIOPSY;  Surgeon: Rolm Bookbinder, MD;  Location: [place];  Service: General;  Laterality: Right;   MASTOPEXY Left 01/14/2014   Procedure: LEFT BREAST MASTOPEXY FOR SYMMETRY ;  Surgeon: Irene Limbo, MD;  Location: [place];  Service: Plastics;  Laterality: Left;   PORT-A-CATH REMOVAL Left 01/14/2014   Procedure: REMOVAL PORT-A-CATH;  Surgeon: Irene Limbo, MD;  Location: [place];  Service:  Plastics;  Laterality: Left;   PORTACATH PLACEMENT N/A 09/16/2013   Procedure: INSERTION PORT-A-CATH;  Surgeon: Rolm Bookbinder, MD;  Location: [place];  Service: General;  Laterality: N/A;   REDUCTION MAMMAPLASTY Left    REMOVAL OF BILATERAL TISSUE EXPANDERS WITH PLACEMENT OF BILATERAL BREAST IMPLANTS Right 01/14/2014   Procedure: REMOVAL OF RIGHT TISSUE EXPANDERS WITH PLACEMENT OF PERMANENT BREAST IMPLANT;  Surgeon: Irene Limbo, MD;  Location: [facility];  Service: Plastics;  Laterality: Right;   TUBAL LIGATION     VAGINAL HYSTERECTOMY      Current Outpatient Medications  Medication Sig Dispense Refill   albuterol (VENTOLIN HFA) 108 (90 Base) MCG/ACT inhaler Inhale 2 puffs into the lungs every 6 (six) hours as needed for wheezing or shortness of breath. (whichever is covered by ins) 18 g 0   ALPRAZolam (XANAX) 0.5 MG tablet Take 0.5-1 tablets (0.25-0.5 mg total) by mouth 2 (two) times daily as needed for anxiety. 60 tablet 0   anastrozole (ARIMIDEX) 1 MG tablet TAKE ONE (1) TABLET EACH DAY 90 tablet 0   ARTIFICIAL TEAR OP Place 1 drop into both eyes daily as needed (dry eyes).     aspirin EC 81 MG tablet Take 81 mg by mouth every other day.     budesonide-formoterol (SYMBICORT) 160-4.5 MCG/ACT inhaler Inhale 1 puff into the lungs 2 (two) times daily. 1 Inhaler 3   Calcium-Magnesium-Zinc 500-250-12.5 MG TABS Take 1 tablet by mouth daily.      Cholecalciferol (VITAMIN D-3) 5000 UNITS TABS Take 10,000 Units by mouth daily.      citalopram (CELEXA) 20 MG tablet Take 1 tablet (20 mg total) by mouth daily. 90 tablet 0   Cyanocobalamin (B-12) 2500 MCG SUBL Place 2,500 mcg under the tongue daily.     meloxicam (MOBIC) 7.5 MG tablet TAKE ONE (1) TABLET EACH DAY 90 tablet 0   pravastatin (PRAVACHOL) 80 MG tablet Take 1 tablet (80 mg total) by mouth daily. 90 tablet 0   No current facility-administered medications for this visit.   Allergies:  Codeine, Crestor  [rosuvastatin calcium], Lipitor [atorvastatin], Namenda [memantine hcl], Peanut-containing drug products, and Multihance [gadobenate]   Social History: The patient  reports that she quit smoking about 24 years ago. Her smoking use included cigarettes. She has a 0.50 pack-year smoking history. She has never used smokeless tobacco. She reports current alcohol use. She reports that she does not use drugs.   Family History: The patient's family history includes Alcoholism in her father; CVA in her mother; Cancer in her brother; Colon cancer in her maternal aunt and another family member; Depression in her mother; Diabetes in her sister, sister, sister, and sister; Diabetes type II in her mother; Heart disease in her mother; Hyperlipidemia in her sister; Hypertension in her mother, sister, sister, and sister; Kidney disease in her sister; Osteoarthritis in her mother; Other in  her mother; Pneumonia in her father.   ROS:  Please see the history of present illness. Otherwise, complete review of systems is positive for none.  All other systems are reviewed and negative.   Physical Exam: VS:  BP 132/88    Pulse 69    Ht 5' 9.5" (1.765 m)    Wt 267 lb (121.1 kg)    SpO2 98%    BMI 38.86 kg/m , BMI Body mass index is 38.86 kg/m.  Wt Readings from Last 3 Encounters:  03/03/20 267 lb (121.1 kg)  01/24/20 267 lb (121.1 kg)  11/04/19 267 lb 3.2 oz (121.2 kg)    General: Obese patient appears comfortable at rest. Neck: Supple, no elevated JVP or carotid bruits, no thyromegaly. Lungs: Clear to auscultation, nonlabored breathing at rest. Cardiac: Regular rate and rhythm, no S3 or significant systolic murmur, no pericardial rub. Extremities: No pitting edema, distal pulses 2+. Skin: Warm and dry. Musculoskeletal: No kyphosis. Neuropsychiatric: Alert and oriented x3, affect grossly appropriate.  ECG:  An ECG dated 03/03/2020 was personally reviewed today and demonstrated:  Normal sinus rhythm rate of 71, RBBB,  LAFB, LVH with repolarization abnormality, cannot rule out septal infarct, age undetermined.  Recent Labwork: 09/24/2019: ALT 16; AST 21; BUN 12; Creatinine, Ser 0.71; Hemoglobin 13.2; Platelets 229; Potassium 4.2; Sodium 140; TSH 1.370     Component Value Date/Time   CHOL 202 (H) 09/24/2019 1252   TRIG 106 09/24/2019 1252   HDL 58 09/24/2019 1252   CHOLHDL 3.5 09/24/2019 1252   LDLCALC 125 (H) 09/24/2019 1252    Other Studies Reviewed Today:  Echocardiogram 08/16/2018: Echo was ordered due to CVA  - Left ventricle: The cavity size was normal. Systolic function was  normal. The estimated ejection fraction was in the range of 50%  to 55%. Wall motion was normal; there were no regional wall  motion abnormalities. Doppler parameters are consistent with  abnormal left ventricular relaxation (grade 1 diastolic  dysfunction).  - Aortic valve: Transvalvular velocity was within the normal range.  There was no stenosis. There was no regurgitation.  - Mitral valve: Transvalvular velocity was within the normal range.  There was no evidence for stenosis. There was trivial  regurgitation.  - Left atrium: The atrium was normal in size.  - Right ventricle: The cavity size was normal. Wall thickness was  normal. Systolic function was normal. RV systolic pressure (S,  est): 32 mm Hg.  - Right atrium: The atrium was normal in size.  - Atrial septum: No defect or patent foramen ovale was identified.  - Tricuspid valve: There was trivial regurgitation.  - Pulmonary arteries: Systolic pressure was mildly increased. PA  peak pressure: 32 mm Hg (S).  - Inferior vena cava: The vessel was normal in size. The  respirophasic diameter changes were in the normal range (>= 50%),  consistent with normal central venous pressure.  - Pericardium, extracardiac: There was no pericardial effusion.   Impressions:   - No cardiac source of emboli was identified  Assessment and  Plan:  1. Palpitations   2. Elevated blood-pressure reading, without diagnosis of hypertension   3. Mixed hyperlipidemia   4. SOB (shortness of breath)    1. Palpitations Patient states palpitations have pretty much resolved since decreasing significantly the amount of caffeine she was taking and including coffee, tea, and soft drinks.  EKG today shows sinus rhythm with rate of 71, RBBB, LAFB, left ventricular hypertrophy with repolarization abnormality.   2. Elevated blood-pressure  reading, without diagnosis of hypertension Blood pressure continues to be mildly elevated with blood pressure today 132/88.  Advised the patient to start checking her blood pressures at home around 3 times a week.  Advised her we want the blood pressures to be below 130/80.  Advised the patient if she continues to have sustained blood pressures over 130/80 we may need to treat with an antihypertensive medication.  3. Mixed hyperlipidemia  Lipid panel on 09/24/2019 TC 202, TG 106, HDL 58, LDL 125.  Continue pravastatin 80 mg p.o. daily.  4.  Dyspnea on exertion/shortness of breath Patient states she has noticed over the last few months increasing dyspnea on exertion, exertional fatigue.  Patient states she cannot tell with its due to her being overweight.  She admits to some exertional fatigue in addition.  Please get a repeat echocardiogram.  Medication Adjustments/Labs and Tests Ordered: Current medicines are reviewed at length with the patient today.  Concerns regarding medicines are outlined above.   Disposition: Follow-up with Dr. Domenic Polite or APP 1 month  Signed, Levell July, NP 03/03/2020 10:30 AM    [facility] [place], [place], [place] [address] Phone: [phone]; Fax: [phone]

## 2020-03-03 ENCOUNTER — Ambulatory Visit: Payer: Medicare HMO | Admitting: Family Medicine

## 2020-03-03 ENCOUNTER — Encounter: Payer: Self-pay | Admitting: Family Medicine

## 2020-03-03 VITALS — BP 132/88 | HR 69 | Ht 69.5 in | Wt 267.0 lb

## 2020-03-03 DIAGNOSIS — E782 Mixed hyperlipidemia: Secondary | ICD-10-CM | POA: Diagnosis not present

## 2020-03-03 DIAGNOSIS — R0602 Shortness of breath: Secondary | ICD-10-CM | POA: Diagnosis not present

## 2020-03-03 DIAGNOSIS — R030 Elevated blood-pressure reading, without diagnosis of hypertension: Secondary | ICD-10-CM | POA: Diagnosis not present

## 2020-03-03 DIAGNOSIS — R002 Palpitations: Secondary | ICD-10-CM | POA: Diagnosis not present

## 2020-03-03 NOTE — Patient Instructions (Addendum)
Medication Instructions:   Your physician recommends that you continue on your current medications as directed. Please refer to the Current Medication list given to you today.  Labwork:  NONE  Testing/Procedures: Your physician has requested that you have an echocardiogram. Echocardiography is a painless test that uses sound waves to create images of your heart. It provides your doctor with information about the size and shape of your heart and how well your heart's chambers and valves are working. This procedure takes approximately one hour. There are no restrictions for this procedure.  Follow-Up:  Your physician recommends that you schedule a follow-up appointment in: 1 month (office)  Any Other Special Instructions Will Be Listed Below (If Applicable).  If you need a refill on your cardiac medications before your next appointment, please call your pharmacy. 

## 2020-03-05 NOTE — Progress Notes (Signed)
Patient Care Team: Janora Norlander, DO as PCP - General (Family Medicine) Satira Sark, MD as PCP - Cardiology (Cardiology) Amada Kingfisher, MD as Consulting Physician (Hematology and Oncology) Danie Binder, MD (Inactive) as Consulting Physician (Gastroenterology) Norwood Levo, MD as Consulting Physician (Neurology)  DIAGNOSIS:    ICD-10-CM   1. Malignant neoplasm of upper-outer quadrant of right breast in female, estrogen receptor positive ([place])  C50.411    Z17.0     SUMMARY OF ONCOLOGIC HISTORY: Oncology History  Breast cancer of upper-outer quadrant of right female breast ([place])  04/29/2013 Mammogram   Right breast mass, 7 mm lesion at 9:00 position, IDC with DCIS grade 2 ER  PR 99% positive HER-2 negative Ki-67 11%: Second mass IDC ER/PR positive HER-2 negative Ki-67 11%, MRI 6 cm including enhancement    07/12/2013 Surgery   Right breast mastectomy: IDC grade 1, 2 foci 1.2 and 1.1 cm, LVI positive, 2 SLN negative, Oncotype DX score 23 intermediate risk, ROR. 14%   09/26/2013 - 11/28/2013 Chemotherapy   Adjuvant Taxotere and Cytoxan x4 cycles   01/14/2014 Surgery   Right breast reconstruction and right port removal   01/17/2014 -  Anti-estrogen oral therapy   anastrozole 1 mg daily     CHIEF COMPLIANT: Follow-up of right breast cancer on anastrozole therapy  INTERVAL HISTORY: Morgan Santos is a 69 y.o. with above-mentioned history of right breast cancer treated with mastectomy with reconstruction, adjuvant chemotherapy, and is currently on anastrozole therapy. Mammogram on 10/03/19 showed no evidence of malignancy in the left breast. She presents to the clinic today for annual follow-up.   ALLERGIES:  is allergic to codeine, crestor [rosuvastatin calcium], lipitor [atorvastatin], namenda [memantine hcl], peanut-containing drug products, and multihance [gadobenate].  MEDICATIONS:  Current Outpatient Medications  Medication Sig Dispense Refill  . albuterol (VENTOLIN  HFA) 108 (90 Base) MCG/ACT inhaler Inhale 2 puffs into the lungs every 6 (six) hours as needed for wheezing or shortness of breath. (whichever is covered by ins) 18 g 0  . ALPRAZolam (XANAX) 0.5 MG tablet Take 0.5-1 tablets (0.25-0.5 mg total) by mouth 2 (two) times daily as needed for anxiety. 60 tablet 0  . anastrozole (ARIMIDEX) 1 MG tablet TAKE ONE (1) TABLET EACH DAY 90 tablet 0  . ARTIFICIAL TEAR OP Place 1 drop into both eyes daily as needed (dry eyes).    Marland Kitchen aspirin EC 81 MG tablet Take 81 mg by mouth every other day.    . budesonide-formoterol (SYMBICORT) 160-4.5 MCG/ACT inhaler Inhale 1 puff into the lungs 2 (two) times daily. 1 Inhaler 3  . Calcium-Magnesium-Zinc 500-250-12.5 MG TABS Take 1 tablet by mouth daily.     . Cholecalciferol (VITAMIN D-3) 5000 UNITS TABS Take 10,000 Units by mouth daily.     . citalopram (CELEXA) 20 MG tablet Take 1 tablet (20 mg total) by mouth daily. 90 tablet 0  . Cyanocobalamin (B-12) 2500 MCG SUBL Place 2,500 mcg under the tongue daily.    . meloxicam (MOBIC) 7.5 MG tablet TAKE ONE (1) TABLET EACH DAY 90 tablet 0  . pravastatin (PRAVACHOL) 80 MG tablet Take 1 tablet (80 mg total) by mouth daily. 90 tablet 0   No current facility-administered medications for this visit.    PHYSICAL EXAMINATION: ECOG PERFORMANCE STATUS: 1 - Symptomatic but completely ambulatory  There were no vitals filed for this visit. There were no vitals filed for this visit.  BREAST: No palpable masses or nodules in either right or left breasts.  No palpable axillary supraclavicular or infraclavicular adenopathy no breast tenderness or nipple discharge. (exam performed in the presence of a chaperone)  LABORATORY DATA:  I have reviewed the data as listed CMP Latest Ref Rng & Units 09/24/2019 06/04/2019 12/27/2017  Glucose 65 - 99 mg/dL 93 98 90  BUN 8 - 27 mg/dL 12 13 10   Creatinine 0.57 - 1.00 mg/dL 0.71 0.73 0.74  Sodium 134 - 144 mmol/L 140 139 141  Potassium 3.5 - 5.2 mmol/L  4.2 4.1 4.1  Chloride 96 - 106 mmol/L 101 103 104  CO2 20 - 29 mmol/L 25 25 23   Calcium 8.7 - 10.3 mg/dL 9.5 9.3 9.3  Total Protein 6.0 - 8.5 g/dL 6.9 6.4 6.3  Total Bilirubin 0.0 - 1.2 mg/dL 0.7 0.6 0.6  Alkaline Phos 39 - 117 IU/L 137(H) 130(H) 140(H)  AST 0 - 40 IU/L 21 24 22   ALT 0 - 32 IU/L 16 22 19     Lab Results  Component Value Date   WBC 5.0 09/24/2019   HGB 13.2 09/24/2019   HCT 40.3 09/24/2019   MCV 89 09/24/2019   PLT 229 09/24/2019   NEUTROABS 2.2 09/24/2019    ASSESSMENT & PLAN:  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 ER 99% PR 99% HER-2 negative: Intermediate risk Oncotype DX recurrence score 23. Patient received 4 cycles of Taxotere Cytoxan chemotherapy. She had breast reconstruction subsequently in May 2015, started Arimidex May 2015 discontinued 03/06/2020  Surveillance:  1. Breast exam4/4/2019did not reveal any lumps or nodules 2. Mammogram1/30/2020is benign density category B 3. Bone density April 2015 showed T score -0.6  After much discussion we decided to discontinue anastrozole therapy at this time because she wants to see if stopping anastrozole will help her lose weight  Arimidex toxicities: 1. Hot flashes accompanied by sweating especially in hot weather 2. Fatigue 3.Weight gain:We discussed the importance of him losing weight and staying healthy. She accepted the challenge that next day when she comes back she would have lost significant weight. I instructed her that keto weight loss is eating more vegetables and drinking more water and avoiding simple carbohydrates were like pastas breads and rice.  Return to clinic 1 year for follow-up    No orders of the defined types were placed in this encounter.  The patient has a good understanding of the overall plan. she agrees with it. she will call with  any problems that may develop before the next visit here.  Total time spent: 20 mins including face to face time and time spent for planning, charting and coordination of care  Nicholas Lose, MD 03/06/2020  I, Cloyde Reams Dorshimer, am acting as scribe for Dr. Nicholas Lose.  I have reviewed the above documentation for accuracy and completeness, and I agree with the above.

## 2020-03-06 ENCOUNTER — Inpatient Hospital Stay: Payer: Medicare HMO | Attending: Hematology and Oncology | Admitting: Hematology and Oncology

## 2020-03-06 ENCOUNTER — Telehealth: Payer: Self-pay | Admitting: Hematology and Oncology

## 2020-03-06 ENCOUNTER — Other Ambulatory Visit: Payer: Self-pay

## 2020-03-06 DIAGNOSIS — C50411 Malignant neoplasm of upper-outer quadrant of right female breast: Secondary | ICD-10-CM | POA: Insufficient documentation

## 2020-03-06 DIAGNOSIS — Z79899 Other long term (current) drug therapy: Secondary | ICD-10-CM | POA: Insufficient documentation

## 2020-03-06 DIAGNOSIS — Z7982 Long term (current) use of aspirin: Secondary | ICD-10-CM | POA: Diagnosis not present

## 2020-03-06 DIAGNOSIS — Z9011 Acquired absence of right breast and nipple: Secondary | ICD-10-CM | POA: Insufficient documentation

## 2020-03-06 DIAGNOSIS — Z170 Estrogen receptor positive status [ER+]: Secondary | ICD-10-CM | POA: Diagnosis not present

## 2020-03-06 DIAGNOSIS — Z79811 Long term (current) use of aromatase inhibitors: Secondary | ICD-10-CM | POA: Diagnosis not present

## 2020-03-06 DIAGNOSIS — Z9221 Personal history of antineoplastic chemotherapy: Secondary | ICD-10-CM | POA: Diagnosis not present

## 2020-03-06 NOTE — Telephone Encounter (Signed)
Scheduled appts per 7/16 los. Gave pt a print out of AVS.

## 2020-03-06 NOTE — Assessment & Plan Note (Signed)
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 ER 99% PR 99% HER-2 negative: Intermediate risk Oncotype DX recurrence score 23. Patient received 4 cycles of Taxotere Cytoxan chemotherapy. She had breast reconstruction subsequently in May 2015, started Arimidex May 2015  Surveillance:  1. Breast exam4/4/2019did not reveal any lumps or nodules 2. Mammogram1/30/2020is benign density category B 3. Bone density April 2015 showed T score -0.6 We will need to obtain another bone density  Arimidex toxicities: 1. Hot flashes accompanied by sweating especially in hot weather 2. Fatigue 3.Weight gain:Refer her to [facility] health weight loss program Right leg and back pain: Could be arthritis versus bursitis   Low back pain: chronic Return to clinic 1 year for follow-up

## 2020-03-11 ENCOUNTER — Other Ambulatory Visit: Payer: Self-pay | Admitting: *Deleted

## 2020-03-11 DIAGNOSIS — E785 Hyperlipidemia, unspecified: Secondary | ICD-10-CM

## 2020-03-11 MED ORDER — MELOXICAM 7.5 MG PO TABS: ORAL_TABLET | ORAL | 0 refills | Status: DC

## 2020-03-11 MED ORDER — CITALOPRAM HYDROBROMIDE 20 MG PO TABS: 20.0000 mg | ORAL_TABLET | Freq: Every day | ORAL | 0 refills | Status: DC

## 2020-03-11 MED ORDER — PRAVASTATIN SODIUM 80 MG PO TABS: 80.0000 mg | ORAL_TABLET | Freq: Every day | ORAL | 0 refills | Status: DC

## 2020-03-19 ENCOUNTER — Ambulatory Visit (INDEPENDENT_AMBULATORY_CARE_PROVIDER_SITE_OTHER): Payer: Medicare HMO

## 2020-03-19 DIAGNOSIS — R0602 Shortness of breath: Secondary | ICD-10-CM | POA: Diagnosis not present

## 2020-03-19 LAB — ECHOCARDIOGRAM COMPLETE
AR max vel: 1.89 cm2
AV Area VTI: 2.08 cm2
AV Area mean vel: 2 cm2
AV Mean grad: 4.6 mmHg
AV Peak grad: 8.8 mmHg
Ao pk vel: 1.49 m/s
Area-P 1/2: 3.03 cm2
Calc EF: 51.9 %
MV M vel: 4.23 m/s
MV Peak grad: 71.6 mmHg
S' Lateral: 3.59 cm
Single Plane A2C EF: 53.4 %
Single Plane A4C EF: 52.3 %

## 2020-03-23 ENCOUNTER — Telehealth: Payer: Self-pay | Admitting: *Deleted

## 2020-03-23 NOTE — Telephone Encounter (Signed)
Patient informed. Copy sent to PCP °

## 2020-03-23 NOTE — Telephone Encounter (Signed)
-----   Message from Verta Ellen., NP sent at 03/19/2020 12:52 PM EDT ----- Please call the patient and let her know that the echo cardiogram is essentially unchanged from the study done in 2019.  Nothing on the echo that would explain her shortness of breath.  Tell her if dyspnea continues or worsens we may need to refer her to pulmonology and/or get a stress test.  Thank you

## 2020-03-25 NOTE — Progress Notes (Signed)
Cardiology Office Note  Date: 03/26/2020   ID: Morgan, Santos June 12, 1951, MRN [id number]  PCP:  Janora Norlander, DO  Cardiologist:  Rozann Lesches, MD Electrophysiologist:  None   Chief Complaint: Follow-up palpitations   History of Present Illness: Morgan Santos is a 69 y.o. female with a history of palpitations, hyperlipidemia, elevated BP without diagnosis of hypertension.  Last saw Dr. Bronson Ing 11/04/2019 for palpitations.  She had been experiencing palpitations since the end of 2020.  She stated they were seldom and recurrent lasting only seconds.  She primarily noticed them at night when lying down or on her left side along her left arm.  She admitted to some wheezing with exertion and history of asthma.  She denied any exertional chest pain, orthopnea, PND, or leg swelling.  Previous EKGs showed sinus rhythm with left bundle branch block.  At last visit she was not having any recent palpitations since decreasing the amount of coffee, tea and soft drinks she was drinking in the past.  States she does have some increased dyspnea on exertion over the last few months.  She was attempting to start an exercise program but states she knew she was out of shape and overweight.  Blood pressure had been slightly elevated over the previous 3 measurements.  Last echocardiogram showed grade 1 diastolic dysfunction with EF of 50 to 55%.   She is here for follow-up today.  We went over her echocardiogram which is essentially unchanged from previous echocardiogram.  Patient states she is trying to exercise more but has recently had some dietary indiscretions.  States she is working on this and needs to improve her dietary habits.  She denies any anginal or exertional symptoms, palpitations, orthostatic symptoms, CVA or TIA-like symptoms, bleeding issues, PND, orthopnea, claudication, DVT or PE-like symptoms, lower extremity edema.  She states her shortness of breath has improved with exercise.   She believes she has become deconditioned from lack of activity/exercise.  Past Medical History:  Diagnosis Date  . Anemia    when she was younger  . Anxiety   . Arthritis    hip- R, back; osteoarthritis  . Asthma   . Breast cancer ([place])    right  . Complication of anesthesia   . Constipation   . Depression    pt. remarks that she "takes respridal because if I don't take it I get angry"   . GERD (gastroesophageal reflux disease)    pt. uses vinegar for heartburn, ususally once per week   . H/O degenerative disc disease   . Hyperlipidemia   . Hypertension    "when I get upset"  Not on medication  . Memory loss   . PONV (postoperative nausea and vomiting)    "just for one day"  . Stroke [facility]) ?2010 or 2011   2010, [facility] for stroke "mini stroke" short term memory problems since    Past Surgical History:  Procedure Laterality Date  . BREAST RECONSTRUCTION WITH PLACEMENT OF TISSUE EXPANDER AND FLEX HD (ACELLULAR HYDRATED DERMIS) Right 07/12/2013   Procedure: RIGHT BREAST RECONSTRUCTION WITH PLACEMENT OF TISSUE EXPANDER AND FLEX HD TO RIGHT BREAST (ACELLULAR HYDRATED DERMIS);  Surgeon: Irene Limbo, MD;  Location: [place];  Service: Plastics;  Laterality: Right;  . BREAST SURGERY     for abcesses- removed from both breasts, many yrs. ago  . CHOLECYSTECTOMY    . COLONOSCOPY   01/29/2004   NUR:A 7-8 mm polyp snared from the hepatic flexure.  Another 3 mm polyp was     cold snared from the sigmoid colon.External hemorrhoids, possible source of recent rectal bleeding  . COLONOSCOPY N/A 06/05/2014   Procedure: COLONOSCOPY;  Surgeon: Danie Binder, MD;  [facility];  Service: Endoscopy;  Laterality: N/A;  1030 - moved to 10:45 - Ginger to notify pt   . COLONOSCOPY WITH PROPOFOL N/A 10/17/2017   Procedure: COLONOSCOPY WITH PROPOFOL;  Surgeon: Danie Binder, MD;  [facility];  Service: Endoscopy;  Laterality: N/A;  10:30am  . DILATION AND CURETTAGE  OF UTERUS    . LUMBAR SPINE SURGERY    . MASTECTOMY Right   . MASTECTOMY W/ SENTINEL NODE BIOPSY Right 07/12/2013   Procedure: MASTECTOMY WITH SENTINEL LYMPH NODE BIOPSY;  Surgeon: Rolm Bookbinder, MD;  Location: [place];  Service: General;  Laterality: Right;  . MASTOPEXY Left 01/14/2014   Procedure: LEFT BREAST MASTOPEXY FOR SYMMETRY ;  Surgeon: Irene Limbo, MD;  Location: [place];  Service: Plastics;  Laterality: Left;  . PORT-A-CATH REMOVAL Left 01/14/2014   Procedure: REMOVAL PORT-A-CATH;  Surgeon: Irene Limbo, MD;  Location: [place];  Service: Plastics;  Laterality: Left;  . PORTACATH PLACEMENT N/A 09/16/2013   Procedure: INSERTION PORT-A-CATH;  Surgeon: Rolm Bookbinder, MD;  Location: [place];  Service: General;  Laterality: N/A;  . REDUCTION MAMMAPLASTY Left   . REMOVAL OF BILATERAL TISSUE EXPANDERS WITH PLACEMENT OF BILATERAL BREAST IMPLANTS Right 01/14/2014   Procedure: REMOVAL OF RIGHT TISSUE EXPANDERS WITH PLACEMENT OF PERMANENT BREAST IMPLANT;  Surgeon: Irene Limbo, MD;  Location: [place];  Service: Plastics;  Laterality: Right;  . TUBAL LIGATION    . VAGINAL HYSTERECTOMY      Current Outpatient Medications  Medication Sig Dispense Refill  . albuterol (VENTOLIN HFA) 108 (90 Base) MCG/ACT inhaler Inhale 2 puffs into the lungs every 6 (six) hours as needed for wheezing or shortness of breath. (whichever is covered by ins) 18 g 0  . ALPRAZolam (XANAX) 0.5 MG tablet Take 0.5-1 tablets (0.25-0.5 mg total) by mouth 2 (two) times daily as needed for anxiety. 60 tablet 0  . ARTIFICIAL TEAR OP Place 1 drop into both eyes daily as needed (dry eyes).    Marland Kitchen aspirin EC 81 MG tablet Take 81 mg by mouth every other day.    . budesonide-formoterol (SYMBICORT) 160-4.5 MCG/ACT inhaler Inhale 1 puff into the lungs 2 (two) times daily. 1 Inhaler 3  . Calcium-Magnesium-Zinc 500-250-12.5 MG TABS Take 1 tablet by mouth daily.     .  Cholecalciferol (VITAMIN D-3) 5000 UNITS TABS Take 10,000 Units by mouth daily.     . citalopram (CELEXA) 20 MG tablet Take 1 tablet (20 mg total) by mouth daily. 90 tablet 0  . Cyanocobalamin (B-12) 2500 MCG SUBL Place 2,500 mcg under the tongue daily.    . meloxicam (MOBIC) 7.5 MG tablet TAKE ONE (1) TABLET EACH DAY 90 tablet 0  . pravastatin (PRAVACHOL) 80 MG tablet Take 1 tablet (80 mg total) by mouth daily. 90 tablet 0   No current facility-administered medications for this visit.   Allergies:  Codeine, Crestor [rosuvastatin calcium], Lipitor [atorvastatin], Namenda [memantine hcl], Peanut-containing drug products, and Multihance [gadobenate]   Social History: The patient  reports that she quit smoking about 24 years ago. Her smoking use included cigarettes. She has a 0.50 pack-year smoking history. She has never used smokeless tobacco. She reports current alcohol use. She reports that she does not  use drugs.   Family History: The patient's family history includes Alcoholism in her father; CVA in her mother; Cancer in her brother; Colon cancer in her maternal aunt and another family member; Depression in her mother; Diabetes in her sister, sister, sister, and sister; Diabetes type II in her mother; Heart disease in her mother; Hyperlipidemia in her sister; Hypertension in her mother, sister, sister, and sister; Kidney disease in her sister; Osteoarthritis in her mother; Other in her mother; Pneumonia in her father.   ROS:  Please see the history of present illness. Otherwise, complete review of systems is positive for none.  All other systems are reviewed and negative.   Physical Exam: VS:  BP (!) 142/90 (BP Location: Left Arm, Patient Position: Sitting, Cuff Size: Large)   Pulse 65   Ht 5' 9.5" (1.765 m)   Wt 266 lb 6.4 oz (120.8 kg)   SpO2 97%   BMI 38.78 kg/m , BMI Body mass index is 38.78 kg/m.  Wt Readings from Last 3 Encounters:  03/26/20 266 lb 6.4 oz (120.8 kg)  03/06/20 266  lb 6.4 oz (120.8 kg)  03/03/20 267 lb (121.1 kg)    General: Obese patient appears comfortable at rest. Neck: Supple, no elevated JVP or carotid bruits, no thyromegaly. Lungs: Clear to auscultation, nonlabored breathing at rest. Cardiac: Regular rate and rhythm, no S3 or significant systolic murmur, no pericardial rub. Extremities: No pitting edema, distal pulses 2+. Skin: Warm and dry. Musculoskeletal: No kyphosis. Neuropsychiatric: Alert and oriented x3, affect grossly appropriate.  ECG:  An ECG dated 03/03/2020 was personally reviewed today and demonstrated:  Normal sinus rhythm rate of 71, RBBB, LAFB, LVH with repolarization abnormality, cannot rule out septal infarct, age undetermined.  Recent Labwork: 09/24/2019: ALT 16; AST 21; BUN 12; Creatinine, Ser 0.71; Hemoglobin 13.2; Platelets 229; Potassium 4.2; Sodium 140; TSH 1.370     Component Value Date/Time   CHOL 202 (H) 09/24/2019 1252   TRIG 106 09/24/2019 1252   HDL 58 09/24/2019 1252   CHOLHDL 3.5 09/24/2019 1252   LDLCALC 125 (H) 09/24/2019 1252    Other Studies Reviewed Today:  Echocardiogram 03/19/2020  1. Left ventricular ejection fraction, by estimation, is 50 to 55%. The left ventricle has low normal function. The left ventricle has no regional wall motion abnormalities. Left ventricular diastolic parameters are consistent with Grade I diastolic dysfunction (impaired relaxation). 2. Right ventricular systolic function is normal. The right ventricular size is normal. There is mildly elevated pulmonary artery systolic pressure. 3. The mitral valve is normal in structure. Trivial mitral valve regurgitation. No evidence of mitral stenosis. 4. The aortic valve is tricuspid. Aortic valve regurgitation is not visualized. No aortic stenosis is present. 5. The inferior vena cava is normal in size with greater than 50% respiratory variability, suggesting right atrial pressure of 3 mmHg. Comparison(s): Echocardiogram done  08/16/18 showed an EF of 50-55%.   Echocardiogram 08/16/2018: Echo was ordered due to CVA  - Left ventricle: The cavity size was normal. Systolic function was  normal. The estimated ejection fraction was in the range of 50%  to 55%. Wall motion was normal; there were no regional wall  motion abnormalities. Doppler parameters are consistent with  abnormal left ventricular relaxation (grade 1 diastolic  dysfunction).  - Aortic valve: Transvalvular velocity was within the normal range.  There was no stenosis. There was no regurgitation.  - Mitral valve: Transvalvular velocity was within the normal range.  There was no evidence for stenosis. There was  trivial  regurgitation.  - Left atrium: The atrium was normal in size.  - Right ventricle: The cavity size was normal. Wall thickness was  normal. Systolic function was normal. RV systolic pressure (S,  est): 32 mm Hg.  - Right atrium: The atrium was normal in size.  - Atrial septum: No defect or patent foramen ovale was identified.  - Tricuspid valve: There was trivial regurgitation.  - Pulmonary arteries: Systolic pressure was mildly increased. PA  peak pressure: 32 mm Hg (S).  - Inferior vena cava: The vessel was normal in size. The  respirophasic diameter changes were in the normal range (>= 50%),  consistent with normal central venous pressure.  - Pericardium, extracardiac: There was no pericardial effusion.   Impressions:   - No cardiac source of emboli was identified  Assessment and Plan:   1. Palpitations Patient states palpitations have pretty much resolved since decreasing significantly the amount of caffeine she was taking and including coffee, tea, and soft drinks.  No complaints of palpitations today.  2. Elevated blood-pressure reading, without diagnosis of hypertension Blood pressure continues to be elevated with blood pressure today 142/90.  Recheck in left arm 138/88.  Advised the patient  at last visit to start checking her blood pressures at home around 3 times a week.  Advised her we want the blood pressures to be below 130/80.  Patient states normally her blood pressures are pretty good at home.  3. Mixed hyperlipidemia  Lipid panel on 09/24/2019 TC 202, TG 106, HDL 58, LDL 125.  Continue pravastatin 80 mg p.o. daily.  4.  Dyspnea on exertion/shortness of breath Recent echocardiogram essentially unchanged from previous echo.  EF of 50 to 55%.,  Grade 1 DD, trivial MR.  Patient states her shortness of breath is improving since she started exercising.  She states she plans to continue her exercise/walking plan.   Medication Adjustments/Labs and Tests Ordered: Current medicines are reviewed at length with the patient today.  Concerns regarding medicines are outlined above.   Disposition: Follow-up with Dr. Domenic Polite or APP 6 months  Signed, Levell July, NP 03/26/2020 9:13 AM    [facility] [place], [place], [place] [address] Phone: [phone]; Fax: [phone]

## 2020-03-26 ENCOUNTER — Encounter: Payer: Self-pay | Admitting: Family Medicine

## 2020-03-26 ENCOUNTER — Ambulatory Visit: Payer: Medicare HMO | Admitting: Family Medicine

## 2020-03-26 VITALS — BP 138/88 | HR 65 | Ht 69.5 in | Wt 266.4 lb

## 2020-03-26 DIAGNOSIS — R002 Palpitations: Secondary | ICD-10-CM | POA: Diagnosis not present

## 2020-03-26 DIAGNOSIS — E782 Mixed hyperlipidemia: Secondary | ICD-10-CM | POA: Diagnosis not present

## 2020-03-26 DIAGNOSIS — R0600 Dyspnea, unspecified: Secondary | ICD-10-CM

## 2020-03-26 DIAGNOSIS — R030 Elevated blood-pressure reading, without diagnosis of hypertension: Secondary | ICD-10-CM

## 2020-03-26 DIAGNOSIS — R0609 Other forms of dyspnea: Secondary | ICD-10-CM

## 2020-03-26 NOTE — Patient Instructions (Signed)

## 2020-04-13 ENCOUNTER — Ambulatory Visit (INDEPENDENT_AMBULATORY_CARE_PROVIDER_SITE_OTHER): Payer: Medicare HMO | Admitting: Nurse Practitioner

## 2020-04-13 ENCOUNTER — Other Ambulatory Visit: Payer: Self-pay

## 2020-04-13 ENCOUNTER — Encounter: Payer: Self-pay | Admitting: Nurse Practitioner

## 2020-04-13 VITALS — BP 116/65 | HR 61 | Temp 96.0°F | Ht 69.5 in | Wt 265.0 lb

## 2020-04-13 DIAGNOSIS — R300 Dysuria: Secondary | ICD-10-CM | POA: Diagnosis not present

## 2020-04-13 DIAGNOSIS — R399 Unspecified symptoms and signs involving the genitourinary system: Secondary | ICD-10-CM | POA: Diagnosis not present

## 2020-04-13 LAB — URINALYSIS, COMPLETE
Bilirubin, UA: NEGATIVE
Glucose, UA: NEGATIVE
Nitrite, UA: NEGATIVE
Specific Gravity, UA: >1.03 — ABNORMAL HIGH (ref 1.005–1.030)
Urobilinogen, Ur: 0.2 mg/dL (ref 0.2–1.0)
pH, UA: 5 (ref 5.0–7.5)

## 2020-04-13 LAB — MICROSCOPIC EXAMINATION

## 2020-04-13 MED ORDER — TRIMETHOPRIM 100 MG PO TABS: 100.0000 mg | ORAL_TABLET | Freq: Two times a day (BID) | ORAL | 0 refills | Status: DC

## 2020-04-13 NOTE — Patient Instructions (Addendum)

## 2020-04-13 NOTE — Assessment & Plan Note (Addendum)
Concerning patient dysuria she comes in with UTI symptoms started in the last 7 days symptoms are not well managed.  Patient has not used anything over-the-counter to help with symptoms she complains of burning while urinating, with urinary retention.  Advised patient to push fluids, advised patient to provide urine specimen for urine dipstick.  Provided education with printed handouts given.   Patient follow-up with worsening or unresolved symptoms.

## 2020-04-13 NOTE — Assessment & Plan Note (Signed)
Patient is a 69 year old female who presents to clinic with urinary tract symptoms patient reports in the last 7 days she has had dysuria, nausea, abdominal pain, sweating and chills.  On assessment patient has a left CVA tenderness.  Patient also reports foul urine and discharge.  Patient reports having the same symptoms in the past and attributes the use of pravastatin causing UTI symptoms.  Patient reports in the past she has reduce or cut pills in half and that reduced her symptoms.  Urine dipstick ordered, patient unable to provide specimen. We will treat patient based on history collected.  Starting patient on nitrofurantoin 100 mg twice daily.  Patient is asked to return back to clinic in an hour to bring urine specimen or first thein in the morning prior to starting nitrofurantoin.  Patient verbalized understanding. Education provided with printed handouts. Follow-up with worsening or unresolved symptoms.

## 2020-04-13 NOTE — Progress Notes (Signed)
Acute Office Visit  Subjective:    Patient ID: Morgan Santos, female    DOB: June 18, 1951, 69 y.o.   MRN: [id number]  Chief Complaint  Patient presents with  . Urinary Urgency    been going on for 2 weeks, Patient states she leakes urine, Patient has not taken anything otc. She is drinking cranberry juice   . Urinary Frequency    HPI Patient is in today for SUBJECTIVE: DAIANNA Santos is a 69 y.o. female who complains of urinary frequency, urgency and dysuria x 7 days, with flank pain, fever, chills, foul urine  OBJECTIVE: Appears well, in no apparent distress.  Vital signs are normal. The abdomen is soft without tenderness, guarding, mass, rebound or organomegaly. left CVA tenderness  noted. Urine dipstick shows patient unable to produce specimen.  Micro exam: not done.    Past Medical History:  Diagnosis Date  . Anemia    when she was younger  . Anxiety   . Arthritis    hip- R, back; osteoarthritis  . Asthma   . Breast cancer ([place])    right  . Complication of anesthesia   . Constipation   . Depression    pt. remarks that she "takes respridal because if I don't take it I get angry"   . GERD (gastroesophageal reflux disease)    pt. uses vinegar for heartburn, ususally once per week   . H/O degenerative disc disease   . Hyperlipidemia   . Hypertension    "when I get upset"  Not on medication  . Memory loss   . PONV (postoperative nausea and vomiting)    "just for one day"  . Stroke [facility]) ?2010 or 2011   2010, [facility] for stroke "mini stroke" short term memory problems since    Past Surgical History:  Procedure Laterality Date  . BREAST RECONSTRUCTION WITH PLACEMENT OF TISSUE EXPANDER AND FLEX HD (ACELLULAR HYDRATED DERMIS) Right 07/12/2013   Procedure: RIGHT BREAST RECONSTRUCTION WITH PLACEMENT OF TISSUE EXPANDER AND FLEX HD TO RIGHT BREAST (ACELLULAR HYDRATED DERMIS);  Surgeon: Irene Limbo, MD;  Location: [place];  Service: Plastics;  Laterality: Right;  .  BREAST SURGERY     for abcesses- removed from both breasts, many yrs. ago  . CHOLECYSTECTOMY    . COLONOSCOPY   01/29/2004   NUR:A 7-8 mm polyp snared from the hepatic flexure.  Another 3 mm polyp was     cold snared from the sigmoid colon.External hemorrhoids, possible source of recent rectal bleeding  . COLONOSCOPY N/A 06/05/2014   Procedure: COLONOSCOPY;  Surgeon: Danie Binder, MD;  [facility];  Service: Endoscopy;  Laterality: N/A;  1030 - moved to 10:45 - Ginger to notify pt   . COLONOSCOPY WITH PROPOFOL N/A 10/17/2017   Procedure: COLONOSCOPY WITH PROPOFOL;  Surgeon: Danie Binder, MD;  [facility];  Service: Endoscopy;  Laterality: N/A;  10:30am  . DILATION AND CURETTAGE OF UTERUS    . LUMBAR SPINE SURGERY    . MASTECTOMY Right   . MASTECTOMY W/ SENTINEL NODE BIOPSY Right 07/12/2013   Procedure: MASTECTOMY WITH SENTINEL LYMPH NODE BIOPSY;  Surgeon: Rolm Bookbinder, MD;  Location: [place];  Service: General;  Laterality: Right;  . MASTOPEXY Left 01/14/2014   Procedure: LEFT BREAST MASTOPEXY FOR SYMMETRY ;  Surgeon: Irene Limbo, MD;  Location: [place];  Service: Plastics;  Laterality: Left;  . PORT-A-CATH REMOVAL Left 01/14/2014   Procedure: REMOVAL PORT-A-CATH;  Surgeon:  Irene Limbo, MD;  Location: [place];  Service: Plastics;  Laterality: Left;  . PORTACATH PLACEMENT N/A 09/16/2013   Procedure: INSERTION PORT-A-CATH;  Surgeon: Rolm Bookbinder, MD;  Location: [place];  Service: General;  Laterality: N/A;  . REDUCTION MAMMAPLASTY Left   . REMOVAL OF BILATERAL TISSUE EXPANDERS WITH PLACEMENT OF BILATERAL BREAST IMPLANTS Right 01/14/2014   Procedure: REMOVAL OF RIGHT TISSUE EXPANDERS WITH PLACEMENT OF PERMANENT BREAST IMPLANT;  Surgeon: Irene Limbo, MD;  Location: [place];  Service: Plastics;  Laterality: Right;  . TUBAL LIGATION    . VAGINAL HYSTERECTOMY      Family History  Problem Relation  Age of Onset  . Hypertension Mother   . Diabetes type II Mother   . CVA Mother   . Heart disease Mother   . Osteoarthritis Mother   . Depression Mother   . Other Mother        degenerative disc disease  . Alcoholism Father   . Pneumonia Father   . Colon cancer Maternal Aunt        age 50s  . Colon cancer Other        maternal great aunt  . Diabetes Sister   . Hyperlipidemia Sister   . Hypertension Sister   . Cancer Brother   . Diabetes Sister   . Hypertension Sister   . Diabetes Sister   . Hypertension Sister   . Diabetes Sister   . Kidney disease Sister   . Colon polyps Neg Hx     Social History   Socioeconomic History  . Marital status: Married    Spouse name: [name]  . Number of children: 4  . Years of education: 12th  . Highest education level: High school graduate  Occupational History  . 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 Smoking status: Former Smoker    Packs/day: 0.25    Years: 2.00    Pack years: 0.50    Types: Cigarettes    Quit date: 09/14/1995    Years since quitting: 24.5  . Smokeless tobacco: Never Used  Vaping Use  . Vaping Use: Never used  Substance and Sexual Activity  . Alcohol use: Yes    Comment: maybe 3 beers a year  . Drug use: No    Types: Marijuana    Comment: in the past  . Sexual activity: Yes  Other Topics Concern  . Not on file  Social History Narrative   Lives at home with her husband.   Right-handed.   1 cup coffee per day, occasional soda.      Outpatient Medications Prior to Visit  Medication Sig Dispense Refill  . albuterol (VENTOLIN HFA) 108 (90 Base) MCG/ACT inhaler Inhale 2 puffs into the lungs every 6 (six) hours as needed for wheezing or shortness of breath. (whichever is covered by ins) 18 g 0  . ALPRAZolam (XANAX) 0.5 MG tablet Take 0.5-1 tablets (0.25-0.5 mg total) by mouth 2 (two) times daily as needed for anxiety. 60 tablet 0  . ARTIFICIAL TEAR OP Place 1 drop into both eyes  daily as needed (dry eyes).    Marland Kitchen aspirin EC 81 MG tablet Take 81 mg by mouth every other day.    . budesonide-formoterol (SYMBICORT) 160-4.5 MCG/ACT inhaler Inhale 1 puff into the lungs 2 (two) times daily. 1 Inhaler 3  . Calcium-Magnesium-Zinc 500-250-12.5 MG TABS Take 1 tablet by mouth daily.     . Cholecalciferol (VITAMIN D-3)  5000 UNITS TABS Take 10,000 Units by mouth daily.     . citalopram (CELEXA) 20 MG tablet Take 1 tablet (20 mg total) by mouth daily. 90 tablet 0  . Cyanocobalamin (B-12) 2500 MCG SUBL Place 2,500 mcg under the tongue daily.    . meloxicam (MOBIC) 7.5 MG tablet TAKE ONE (1) TABLET EACH DAY 90 tablet 0  . pravastatin (PRAVACHOL) 80 MG tablet Take 1 tablet (80 mg total) by mouth daily. 90 tablet 0   No facility-administered medications prior to visit.    Allergies  Allergen Reactions  . Codeine Nausea And Vomiting  . Crestor [Rosuvastatin Calcium]     Muscle pain  . Lipitor [Atorvastatin]     Muscle pain  . Namenda [Memantine Hcl]     Nightmares   . Peanut-Containing Drug Products     Peanut butter triggers asthma   . Multihance [Gadobenate] Nausea Only and Cough    PT HAD INCREASED MUCOUS PRODUCTION AND PHLEGMY COUGH LASTING 10 MINS AFTER INJECTION, PT SENT HOME WITH BENADRYL, PREV NAUSEA AFTER GAD    Review of Systems  Constitutional: Negative.   HENT: Negative.   Respiratory: Negative.   Cardiovascular: Negative.   Gastrointestinal: Negative for abdominal pain and nausea.  Genitourinary: Positive for dysuria. Negative for flank pain.  Musculoskeletal: Negative.        Objective:    Physical Exam Vitals reviewed.  Constitutional:      Appearance: Normal appearance.  HENT:     Head: Normocephalic.  Eyes:     Conjunctiva/sclera: Conjunctivae normal.  Cardiovascular:     Rate and Rhythm: Normal rate.     Pulses: Normal pulses.     Heart sounds: Normal heart sounds.  Pulmonary:     Effort: Pulmonary effort is normal.     Breath sounds:  Normal breath sounds.  Abdominal:     General: Bowel sounds are normal.     Tenderness: There is no abdominal tenderness.  Genitourinary:    Comments: dysuria Musculoskeletal:        General: No tenderness.  Skin:    General: Skin is warm.  Neurological:     Mental Status: She is alert and oriented to person, place, and time.     There were no vitals taken for this visit. Wt Readings from Last 3 Encounters:  03/26/20 266 lb 6.4 oz (120.8 kg)  03/06/20 266 lb 6.4 oz (120.8 kg)  03/03/20 267 lb (121.1 kg)     Lab Results  Component Value Date   TSH 1.370 09/24/2019   Lab Results  Component Value Date   WBC 5.0 09/24/2019   HGB 13.2 09/24/2019   HCT 40.3 09/24/2019   MCV 89 09/24/2019   PLT 229 09/24/2019   Lab Results  Component Value Date   NA 140 09/24/2019   K 4.2 09/24/2019   CHLORIDE 106 12/09/2014   CO2 25 09/24/2019   GLUCOSE 93 09/24/2019   BUN 12 09/24/2019   CREATININE 0.71 09/24/2019   BILITOT 0.7 09/24/2019   ALKPHOS 137 (H) 09/24/2019   AST 21 09/24/2019   ALT 16 09/24/2019   PROT 6.9 09/24/2019   ALBUMIN 4.1 09/24/2019   CALCIUM 9.5 09/24/2019   ANIONGAP 12 (H) 12/09/2014   EGFR >90 12/09/2014   Lab Results  Component Value Date   CHOL 202 (H) 09/24/2019   Lab Results  Component Value Date   HDL 58 09/24/2019   Lab Results  Component Value Date   LDLCALC 125 (H) 09/24/2019  Lab Results  Component Value Date   TRIG 106 09/24/2019   Lab Results  Component Value Date   CHOLHDL 3.5 09/24/2019   No results found for: HGBA1C     Assessment & Plan:  UTI symptoms Patient is a 69 year old female who presents to clinic with urinary tract symptoms patient reports in the last 7 days she has had dysuria, nausea, abdominal pain, sweating and chills.  On assessment patient has a left CVA tenderness.  Patient also reports foul urine and discharge.  Patient reports having the same symptoms in the past and attributes the use of pravastatin  causing UTI symptoms.  Patient reports in the past she has reduce or cut pills in half and that reduced her symptoms.  Urine dipstick ordered, patient unable to provide specimen. We will treat patient based on history collected.  Starting patient on nitrofurantoin 100 mg twice daily.  Patient is asked to return back to clinic in an hour to bring urine specimen or first thein in the morning prior to starting nitrofurantoin.  Patient verbalized understanding. Education provided with printed handouts. Follow-up with worsening or unresolved symptoms.  Dysuria Concerning patient dysuria she comes in with UTI symptoms started in the last 7 days symptoms are now well managed.  Patient has not used anything over-the-counter to help with symptoms she complains of burning while urinating, with urinary retention.  Advised patient to provide urine specimen for urine dipstick.  Provided education with printed handouts given.   Patient follow-up with worsening or unresolved symptoms.  Problem List Items Addressed This Visit    None    Visit Diagnoses    Dysuria    -  Primary   Relevant Orders   Urinalysis, Complete   Urine Culture         Ivy Lynn, NP

## 2020-04-14 ENCOUNTER — Encounter: Payer: Self-pay | Admitting: *Deleted

## 2020-04-14 ENCOUNTER — Other Ambulatory Visit: Payer: Self-pay | Admitting: Nurse Practitioner

## 2020-04-15 LAB — URINE CULTURE

## 2020-04-20 ENCOUNTER — Telehealth: Payer: Self-pay | Admitting: Family Medicine

## 2020-04-20 NOTE — Telephone Encounter (Signed)
Patient states that she is currently taking antibiotic and is feeling better at this time

## 2020-04-23 ENCOUNTER — Ambulatory Visit (INDEPENDENT_AMBULATORY_CARE_PROVIDER_SITE_OTHER): Payer: Medicare HMO | Admitting: Family Medicine

## 2020-04-23 ENCOUNTER — Encounter: Payer: Self-pay | Admitting: Family Medicine

## 2020-04-23 ENCOUNTER — Other Ambulatory Visit: Payer: Self-pay

## 2020-04-23 VITALS — BP 121/73 | HR 75 | Temp 97.7°F | Ht 69.5 in | Wt 265.8 lb

## 2020-04-23 DIAGNOSIS — F419 Anxiety disorder, unspecified: Secondary | ICD-10-CM

## 2020-04-23 DIAGNOSIS — R457 State of emotional shock and stress, unspecified: Secondary | ICD-10-CM

## 2020-04-23 MED ORDER — CITALOPRAM HYDROBROMIDE 40 MG PO TABS: 40.0000 mg | ORAL_TABLET | Freq: Every day | ORAL | 3 refills | Status: DC

## 2020-04-23 MED ORDER — ALPRAZOLAM 0.5 MG PO TABS: 0.2500 mg | ORAL_TABLET | Freq: Two times a day (BID) | ORAL | 1 refills | Status: DC | PRN

## 2020-04-23 NOTE — Progress Notes (Signed)
Subjective: CC: f/u GAD/ depression PCP: Janora Norlander, DO ZGY:FVCBS Morgan Santos is a 69 y.o. female presenting to clinic today for:  1.  Depression, anxiety Patient reports increased caregiver stress.  Her husband, who suffers from Parkinson's disease has been having some increased hallucinations and mood swings.  She is not yet reached out to his neurologist but he is very closely followed by neurology.  She has been using her Celexa 20 mg daily and actually been increasing her use of Xanax to almost every night at bedtime.  Though she only takes about 1/2 tablet per dose.  This does help her relax and get to sleep.  She was previously on Celexa 40 mg daily but is unsure as to why it was titrated down.  She would like to go back up.  She has 3 children but none that are readily available to help her at home.  He essentially depends on her for most things.   ROS: Per HPI  Allergies  Allergen Reactions  . Codeine Nausea And Vomiting  . Crestor [Rosuvastatin Calcium]     Muscle pain  . Lipitor [Atorvastatin]     Muscle pain  . Namenda [Memantine Hcl]     Nightmares   . Peanut-Containing Drug Products     Peanut butter triggers asthma   . Multihance [Gadobenate] Nausea Only and Cough    PT HAD INCREASED MUCOUS PRODUCTION AND PHLEGMY COUGH LASTING 10 MINS AFTER INJECTION, PT SENT HOME WITH BENADRYL, PREV NAUSEA AFTER GAD   Past Medical History:  Diagnosis Date  . Anemia    when she was younger  . Anxiety   . Arthritis    hip- R, back; osteoarthritis  . Asthma   . Breast cancer ([place])    right  . Complication of anesthesia   . Constipation   . Depression    pt. remarks that she "takes respridal because if I don't take it I get angry"   . GERD (gastroesophageal reflux disease)    pt. uses vinegar for heartburn, ususally once per week   . H/O degenerative disc disease   . Hyperlipidemia   . Hypertension    "when I get upset"  Not on medication  . Memory loss   . PONV  (postoperative nausea and vomiting)    "just for one day"  . Stroke [name]) ?2010 or 2011   2010, [facility] for stroke "mini stroke" short term memory problems since    Current Outpatient Medications:  .  albuterol (VENTOLIN HFA) 108 (90 Base) MCG/ACT inhaler, Inhale 2 puffs into the lungs every 6 (six) hours as needed for wheezing or shortness of breath. (whichever is covered by ins), Disp: 18 g, Rfl: 0 .  ALPRAZolam (XANAX) 0.5 MG tablet, Take 0.5-1 tablets (0.25-0.5 mg total) by mouth 2 (two) times daily as needed for anxiety., Disp: 60 tablet, Rfl: 0 .  ARTIFICIAL TEAR OP, Place 1 drop into both eyes daily as needed (dry eyes)., Disp: , Rfl:  .  aspirin EC 81 MG tablet, Take 81 mg by mouth every other day., Disp: , Rfl:  .  budesonide-formoterol (SYMBICORT) 160-4.5 MCG/ACT inhaler, Inhale 1 puff into the lungs 2 (two) times daily., Disp: 1 Inhaler, Rfl: 3 .  Calcium-Magnesium-Zinc 500-250-12.5 MG TABS, Take 1 tablet by mouth daily. , Disp: , Rfl:  .  Cholecalciferol (VITAMIN D-3) 5000 UNITS TABS, Take 10,000 Units by mouth daily. , Disp: , Rfl:  .  citalopram (CELEXA) 20 MG tablet, Take  1 tablet (20 mg total) by mouth daily., Disp: 90 tablet, Rfl: 0 .  Cyanocobalamin (B-12) 2500 MCG SUBL, Place 2,500 mcg under the tongue daily., Disp: , Rfl:  .  meloxicam (MOBIC) 7.5 MG tablet, TAKE ONE (1) TABLET EACH DAY, Disp: 90 tablet, Rfl: 0 .  pravastatin (PRAVACHOL) 80 MG tablet, Take 1 tablet (80 mg total) by mouth daily., Disp: 90 tablet, Rfl: 0 .  trimethoprim (TRIMPEX) 100 MG tablet, Take 1 tablet (100 mg total) by mouth 2 (two) times daily., Disp: 20 tablet, Rfl: 0 Social History   Socioeconomic History  . Marital status: Married    Spouse name: Morgan Santos  . Number of children: 4  . Years of education: 12th  . Highest education level: High school graduate  Occupational History  . 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 Smoking status: Former Smoker     Packs/day: 0.25    Years: 2.00    Pack years: 0.50    Types: Cigarettes    Quit date: 09/14/1995    Years since quitting: 24.6  . Smokeless tobacco: Never Used  Vaping Use  . Vaping Use: Never used  Substance and Sexual Activity  . Alcohol use: Yes    Comment: maybe 3 beers a year  . Drug use: No    Types: Marijuana    Comment: in the past  . Sexual activity: Yes  Other Topics Concern  . Not on file  Social History Narrative   Lives at home with her husband.   Right-handed.   1 cup coffee per day, occasional soda.   Social Determinants of Health   Financial Resource Strain:   . Difficulty of Paying Living Expenses: Not on file  Food Insecurity:   . Worried About Charity fundraiser in the Last Year: Not on file  . Ran Out of Food in the Last Year: Not on file  Transportation Needs:   . Lack of Transportation (Medical): Not on file  . Lack of Transportation (Non-Medical): Not on file  Physical Activity:   . Days of Exercise per Week: Not on file  . Minutes of Exercise per Session: Not on file  Stress:   . Feeling of Stress : Not on file  Social Connections:   . Frequency of Communication with Friends and Family: Not on file  . Frequency of Social Gatherings with Friends and Family: Not on file  . Attends Religious Services: Not on file  . Active Member of Clubs or Organizations: Not on file  . Attends Archivist Meetings: Not on file  . Marital Status: Not on file  Intimate Partner Violence:   . Fear of Current or Ex-Partner: Not on file  . Emotionally Abused: Not on file  . Physically Abused: Not on file  . Sexually Abused: Not on file   Family History  Problem Relation Age of Onset  . Hypertension Mother   . Diabetes type II Mother   . CVA Mother   . Heart disease Mother   . Osteoarthritis Mother   . Depression Mother   . Other Mother        degenerative disc disease  . Alcoholism Father   . Pneumonia Father   . Colon cancer Maternal Aunt         age 44s  . Colon cancer Other        maternal great aunt  . Diabetes Sister   . Hyperlipidemia Sister   .  Hypertension Sister   . Cancer Brother   . Diabetes Sister   . Hypertension Sister   . Diabetes Sister   . Hypertension Sister   . Diabetes Sister   . Kidney disease Sister   . Colon polyps Neg Hx     Objective: Office vital signs reviewed. BP 121/73   Pulse 75   Temp 97.7 F (36.5 C)   Ht 5' 9.5" (1.765 m)   Wt 265 lb 12.8 oz (120.6 kg)   SpO2 96%   BMI 38.69 kg/m   Physical Examination:  General: Awake, alert, well nourished, No acute distress Cardio: regular rate and rhythm, S1S2 heard, no murmurs appreciated Pulm: clear to auscultation bilaterally, no wheezes, rhonchi or rales; normal work of breathing on room air MSK: normal gait and station Psych: Mood stable, appears slightly stressed. speech normal, affect appropriate, pleasant and interactive. Depression screen [facility] 2/9 04/23/2020 04/23/2020 04/13/2020  Decreased Interest 0 0 0  Down, Depressed, Hopeless 1 0 0  PHQ - 2 Score 1 0 0  Altered sleeping 3 - -  Tired, decreased energy 1 - -  Change in appetite 2 - -  Feeling bad or failure about yourself  1 - -  Trouble concentrating 3 - -  Moving slowly or fidgety/restless 0 - -  Suicidal thoughts 0 - -  PHQ-9 Score 11 - -  Difficult doing work/chores Not difficult at all - -  Some recent data might be hidden   GAD 7 : Generalized Anxiety Score 01/24/2020 09/24/2019  Nervous, Anxious, on Edge 1 1  Control/stop worrying 1 1  Worry too much - different things 1 1  Trouble relaxing 1 0  Restless 0 0  Easily annoyed or irritable 0 3  Afraid - awful might happen 1 3  Total GAD 7 Score 5 9  Anxiety Difficulty - Somewhat difficult   Assessment/ Plan: 69 y.o. female   1. Anxiety Not controlled. Increase celexa to 40mg . Xanax renewed. Use sparingly. The Narcotic Database has been reviewed.  There were no red flags.   - ALPRAZolam (XANAX) 0.5 MG tablet; Take  0.5-1 tablets (0.25-0.5 mg total) by mouth 2 (two) times daily as needed for anxiety.  Dispense: 60 tablet; Refill: 1 - citalopram (CELEXA) 40 MG tablet; Take 1 tablet (40 mg total) by mouth daily.  Dispense: 90 tablet; Refill: 3  2. Carer stress syndrome Resources provided. Encouraged to reach out to his Neurologist with concerns.   No orders of the defined types were placed in this encounter.  No orders of the defined types were placed in this encounter.    Janora Norlander, DO [place] [phone]

## 2020-04-23 NOTE — Patient Instructions (Addendum)
Consider asking his Neurologist about Nuplazid (for hallucinations) Call and ask if he has dementia from [facility].  Express concerns See if his plan offers "respite care".  This might allow you a break  We increased the dose of Citalopram to 40mg .  I sent the new dose in. Xanax renewed.  USE SPARINGLY

## 2020-04-24 ENCOUNTER — Ambulatory Visit: Payer: Medicare HMO | Admitting: Family Medicine

## 2020-06-16 ENCOUNTER — Other Ambulatory Visit: Payer: Self-pay | Admitting: Family Medicine

## 2020-08-20 DIAGNOSIS — Z9011 Acquired absence of right breast and nipple: Secondary | ICD-10-CM | POA: Diagnosis not present

## 2020-08-20 DIAGNOSIS — Z853 Personal history of malignant neoplasm of breast: Secondary | ICD-10-CM | POA: Diagnosis not present

## 2020-08-25 ENCOUNTER — Other Ambulatory Visit: Payer: Self-pay | Admitting: Family Medicine

## 2020-08-25 DIAGNOSIS — E785 Hyperlipidemia, unspecified: Secondary | ICD-10-CM

## 2020-08-25 NOTE — Telephone Encounter (Signed)
[facility] last Lipid 09/23/18 mail order not sent

## 2020-09-17 ENCOUNTER — Other Ambulatory Visit: Payer: Self-pay | Admitting: Family Medicine

## 2020-09-17 DIAGNOSIS — E785 Hyperlipidemia, unspecified: Secondary | ICD-10-CM

## 2020-09-18 NOTE — Telephone Encounter (Signed)
[place] mail order not sent

## 2020-09-28 ENCOUNTER — Other Ambulatory Visit: Payer: Self-pay | Admitting: Family Medicine

## 2020-09-28 DIAGNOSIS — F419 Anxiety disorder, unspecified: Secondary | ICD-10-CM

## 2020-09-29 ENCOUNTER — Other Ambulatory Visit: Payer: Self-pay | Admitting: Physician Assistant

## 2020-09-29 DIAGNOSIS — Z0000 Encounter for general adult medical examination without abnormal findings: Secondary | ICD-10-CM

## 2020-10-01 ENCOUNTER — Ambulatory Visit: Payer: Medicare HMO | Admitting: Cardiology

## 2020-10-19 NOTE — Progress Notes (Signed)
Cardiology Office Note  Date: 10/20/2020   ID: Morgan, Santos 06/01/1951, MRN [id number]  PCP:  Janora Norlander, DO  Cardiologist:  Rozann Lesches, MD Electrophysiologist:  None   Chief Complaint: Follow-up palpitations   History of Present Illness: Morgan Santos is a 70 y.o. female with a history of palpitations, hyperlipidemia, elevated BP without diagnosis of hypertension.  Last saw Dr. Bronson Ing 11/04/2019 for palpitations.  She had been experiencing palpitations since the end of 2020.  She stated they were seldom and recurrent lasting only seconds.  She primarily noticed them at night when lying down or on her left side along her left arm.  She admitted to some wheezing with exertion and history of asthma.  She denied any exertional chest pain, orthopnea, PND, or leg swelling.  Previous EKGs showed sinus rhythm with left bundle branch block.  At last visit she was not having any recent palpitations since decreasing the amount of coffee, tea and soft drinks she was drinking in the past.  States she does have some increased dyspnea on exertion over the last few months.  She was attempting to start an exercise program but states she knew she was out of shape and overweight.  Blood pressure had been slightly elevated over the previous 3 measurements.  Last echocardiogram showed grade 1 diastolic dysfunction with EF of 50 to 55%.   This pleasant lady is here for 62-month follow-up.  She denies any recent acute illnesses or hospitalizations.  No complaints of palpitations or arrhythmias.  Blood pressure today 132/ 90 with a heart rate of 70.  States she has cut down on caffeinated beverages.  Also cut down on things such as potato chips to reduce sodium intake..  She has been going to the gym for exercise.  States she has recently lost a few pounds.  She denies any PND, orthopnea.  She states she tends to sleep a lot.  She is helping take care of her husband who has Parkinson's disease.   She states she knows she needs to lose weight and increase her activity and modify her diet.  Past Medical History:  Diagnosis Date  . Anemia    when she was younger  . Anxiety   . Arthritis    hip- R, back; osteoarthritis  . Asthma   . Breast cancer ([place])    right  . Complication of anesthesia   . Constipation   . Depression    pt. remarks that she "takes respridal because if I don't take it I get angry"   . GERD (gastroesophageal reflux disease)    pt. uses vinegar for heartburn, ususally once per week   . H/O degenerative disc disease   . Hyperlipidemia   . Hypertension    "when I get upset"  Not on medication  . Memory loss   . PONV (postoperative nausea and vomiting)    "just for one day"  . Stroke [facility]) ?2010 or 2011   2010, [facility] for stroke "mini stroke" short term memory problems since    Past Surgical History:  Procedure Laterality Date  . BREAST RECONSTRUCTION WITH PLACEMENT OF TISSUE EXPANDER AND FLEX HD (ACELLULAR HYDRATED DERMIS) Right 07/12/2013   Procedure: RIGHT BREAST RECONSTRUCTION WITH PLACEMENT OF TISSUE EXPANDER AND FLEX HD TO RIGHT BREAST (ACELLULAR HYDRATED DERMIS);  Surgeon: Irene Limbo, MD;  Location: [place];  Service: Plastics;  Laterality: Right;  . BREAST SURGERY     for abcesses- removed from both  breasts, many yrs. ago  . CHOLECYSTECTOMY    . COLONOSCOPY   01/29/2004   NUR:A 7-8 mm polyp snared from the hepatic flexure.  Another 3 mm polyp was     cold snared from the sigmoid colon.External hemorrhoids, possible source of recent rectal bleeding  . COLONOSCOPY N/A 06/05/2014   Procedure: COLONOSCOPY;  Surgeon: Danie Binder, MD;  [facility];  Service: Endoscopy;  Laterality: N/A;  1030 - moved to 10:45 - Ginger to notify pt   . COLONOSCOPY WITH PROPOFOL N/A 10/17/2017   Procedure: COLONOSCOPY WITH PROPOFOL;  Surgeon: Danie Binder, MD;  [facility];  Service: Endoscopy;  Laterality: N/A;  10:30am  .  DILATION AND CURETTAGE OF UTERUS    . LUMBAR SPINE SURGERY    . MASTECTOMY Right   . MASTECTOMY W/ SENTINEL NODE BIOPSY Right 07/12/2013   Procedure: MASTECTOMY WITH SENTINEL LYMPH NODE BIOPSY;  Surgeon: Rolm Bookbinder, MD;  Location: [place];  Service: General;  Laterality: Right;  . MASTOPEXY Left 01/14/2014   Procedure: LEFT BREAST MASTOPEXY FOR SYMMETRY ;  Surgeon: Irene Limbo, MD;  Location: [place];  Service: Plastics;  Laterality: Left;  . PORT-A-CATH REMOVAL Left 01/14/2014   Procedure: REMOVAL PORT-A-CATH;  Surgeon: Irene Limbo, MD;  Location: [place];  Service: Plastics;  Laterality: Left;  . PORTACATH PLACEMENT N/A 09/16/2013   Procedure: INSERTION PORT-A-CATH;  Surgeon: Rolm Bookbinder, MD;  Location: [place];  Service: General;  Laterality: N/A;  . REDUCTION MAMMAPLASTY Left   . REMOVAL OF BILATERAL TISSUE EXPANDERS WITH PLACEMENT OF BILATERAL BREAST IMPLANTS Right 01/14/2014   Procedure: REMOVAL OF RIGHT TISSUE EXPANDERS WITH PLACEMENT OF PERMANENT BREAST IMPLANT;  Surgeon: Irene Limbo, MD;  Location: [place];  Service: Plastics;  Laterality: Right;  . TUBAL LIGATION    . VAGINAL HYSTERECTOMY      Current Outpatient Medications  Medication Sig Dispense Refill  . albuterol (VENTOLIN HFA) 108 (90 Base) MCG/ACT inhaler Inhale 2 puffs into the lungs every 6 (six) hours as needed for wheezing or shortness of breath. (whichever is covered by ins) 18 g 0  . ALPRAZolam (XANAX) 0.5 MG tablet Take 0.5-1 tablets (0.25-0.5 mg total) by mouth 2 (two) times daily as needed for anxiety. 60 tablet 1  . ARTIFICIAL TEAR OP Place 1 drop into both eyes daily as needed (dry eyes).    Marland Kitchen aspirin EC 81 MG tablet Take 81 mg by mouth every other day.    . budesonide-formoterol (SYMBICORT) 160-4.5 MCG/ACT inhaler Inhale 1 puff into the lungs 2 (two) times daily. 1 Inhaler 3  . Calcium-Magnesium-Zinc 500-250-12.5 MG TABS Take 1 tablet by  mouth daily.     . Cholecalciferol (VITAMIN D-3) 5000 UNITS TABS Take 10,000 Units by mouth daily.     . citalopram (CELEXA) 40 MG tablet Take 1 tablet (40 mg total) by mouth daily. 90 tablet 3  . Cyanocobalamin (B-12) 2500 MCG SUBL Place 2,500 mcg under the tongue daily.    . meloxicam (MOBIC) 7.5 MG tablet Take 1 tablet (7.5 mg total) by mouth daily as needed for pain. 90 tablet 2  . pravastatin (PRAVACHOL) 80 MG tablet Take 1 tablet (80 mg total) by mouth daily. 90 tablet 0   No current facility-administered medications for this visit.   Allergies:  Codeine, Crestor [rosuvastatin calcium], Lipitor [atorvastatin], Namenda [memantine hcl], Peanut-containing drug products, and Multihance [gadobenate]   Social History: The patient  reports that she quit  smoking about 25 years ago. Her smoking use included cigarettes. She has a 0.50 pack-year smoking history. She has never used smokeless tobacco. She reports current alcohol use. She reports that she does not use drugs.   Family History: The patient's family history includes Alcoholism in her father; CVA in her mother; Cancer in her brother; Colon cancer in her maternal aunt and another family member; Depression in her mother; Diabetes in her sister, sister, sister, and sister; Diabetes type II in her mother; Heart disease in her mother; Hyperlipidemia in her sister; Hypertension in her mother, sister, sister, and sister; Kidney disease in her sister; Osteoarthritis in her mother; Other in her mother; Pneumonia in her father.   ROS:  Please see the history of present illness. Otherwise, complete review of systems is positive for none.  All other systems are reviewed and negative.   Physical Exam: VS:  BP 132/90   Pulse 70   Ht 5' 10.5" (1.791 m)   Wt 262 lb 12.8 oz (119.2 kg)   SpO2 96%   BMI 37.17 kg/m , BMI Body mass index is 37.17 kg/m.  Wt Readings from Last 3 Encounters:  10/20/20 262 lb 12.8 oz (119.2 kg)  04/23/20 265 lb 12.8 oz  (120.6 kg)  04/13/20 265 lb (120.2 kg)    General: Obese patient appears comfortable at rest. Neck: Supple, no elevated JVP or carotid bruits, no thyromegaly. Lungs: Clear to auscultation, nonlabored breathing at rest. Cardiac: Regular rate and rhythm, no S3 or significant systolic murmur, no pericardial rub. Extremities: No pitting edema, distal pulses 2+. Skin: Warm and dry. Musculoskeletal: No kyphosis. Neuropsychiatric: Alert and oriented x3, affect grossly appropriate.  ECG:  An ECG dated 03/03/2020 was personally reviewed today and demonstrated:  Normal sinus rhythm rate of 71, RBBB, LAFB, LVH with repolarization abnormality, cannot rule out septal infarct, age undetermined.  Recent Labwork: No results found for requested labs within last 8760 hours.     Component Value Date/Time   CHOL 202 (H) 09/24/2019 1252   TRIG 106 09/24/2019 1252   HDL 58 09/24/2019 1252   CHOLHDL 3.5 09/24/2019 1252   LDLCALC 125 (H) 09/24/2019 1252    Other Studies Reviewed Today:  Echocardiogram 03/19/2020  1. Left ventricular ejection fraction, by estimation, is 50 to 55%. The left ventricle has low normal function. The left ventricle has no regional wall motion abnormalities. Left ventricular diastolic parameters are consistent with Grade I diastolic dysfunction (impaired relaxation). 2. Right ventricular systolic function is normal. The right ventricular size is normal. There is mildly elevated pulmonary artery systolic pressure. 3. The mitral valve is normal in structure. Trivial mitral valve regurgitation. No evidence of mitral stenosis. 4. The aortic valve is tricuspid. Aortic valve regurgitation is not visualized. No aortic stenosis is present. 5. The inferior vena cava is normal in size with greater than 50% respiratory variability, suggesting right atrial pressure of 3 mmHg. Comparison(s): Echocardiogram done 08/16/18 showed an EF of 50-55%.   Echocardiogram 08/16/2018: Echo was  ordered due to CVA  - Left ventricle: The cavity size was normal. Systolic function was  normal. The estimated ejection fraction was in the range of 50%  to 55%. Wall motion was normal; there were no regional wall  motion abnormalities. Doppler parameters are consistent with  abnormal left ventricular relaxation (grade 1 diastolic  dysfunction).  - Aortic valve: Transvalvular velocity was within the normal range.  There was no stenosis. There was no regurgitation.  - Mitral valve: Transvalvular velocity was  within the normal range.  There was no evidence for stenosis. There was trivial  regurgitation.  - Left atrium: The atrium was normal in size.  - Right ventricle: The cavity size was normal. Wall thickness was  normal. Systolic function was normal. RV systolic pressure (S,  est): 32 mm Hg.  - Right atrium: The atrium was normal in size.  - Atrial septum: No defect or patent foramen ovale was identified.  - Tricuspid valve: There was trivial regurgitation.  - Pulmonary arteries: Systolic pressure was mildly increased. PA  peak pressure: 32 mm Hg (S).  - Inferior vena cava: The vessel was normal in size. The  respirophasic diameter changes were in the normal range (>= 50%),  consistent with normal central venous pressure.  - Pericardium, extracardiac: There was no pericardial effusion.   Impressions:   - No cardiac source of emboli was identified  Assessment and Plan:   1. Palpitations Patient states palpitations have pretty much resolved since decreasing significantly the amount of caffeine she was taking  including coffee, tea, and soft drinks.  No complaints of palpitations today.  2. Elevated blood-pressure reading, without diagnosis of hypertension Blood pressure has improved some since previous visit.  Today blood pressure 132/90.  Patient states she is trying to cut down on salt intake and is exercising more.  3. Mixed hyperlipidemia  Lipid  panel on 09/24/2019 TC 202, TG 106, HDL 58, LDL 125.  Continue pravastatin 80 mg p.o. daily.  4.  Dyspnea on exertion/shortness of breath Recent echocardiogram essentially unchanged from previous echo.  EF of 50 to 55%.,  Grade 1 DD, trivial MR.  Patient states her shortness of breath is improving since she started exercising.  She states she plans to continue her exercise/walking plan.   Medication Adjustments/Labs and Tests Ordered: Current medicines are reviewed at length with the patient today.  Concerns regarding medicines are outlined above.   Disposition: Follow-up with Dr. Domenic Polite or APP 1 year per her request  Signed, Levell July, NP 10/20/2020 12:31 PM    [facility], [place], [place] [address] Phone: [phone]; Fax: [phone]

## 2020-10-20 ENCOUNTER — Encounter: Payer: Self-pay | Admitting: Family Medicine

## 2020-10-20 ENCOUNTER — Ambulatory Visit: Payer: Medicare HMO | Admitting: Family Medicine

## 2020-10-20 VITALS — BP 132/90 | HR 70 | Ht 70.5 in | Wt 262.8 lb

## 2020-10-20 DIAGNOSIS — E782 Mixed hyperlipidemia: Secondary | ICD-10-CM | POA: Diagnosis not present

## 2020-10-20 DIAGNOSIS — R002 Palpitations: Secondary | ICD-10-CM

## 2020-10-20 DIAGNOSIS — L03031 Cellulitis of right toe: Secondary | ICD-10-CM | POA: Diagnosis not present

## 2020-10-20 DIAGNOSIS — R0609 Other forms of dyspnea: Secondary | ICD-10-CM

## 2020-10-20 DIAGNOSIS — R0600 Dyspnea, unspecified: Secondary | ICD-10-CM | POA: Diagnosis not present

## 2020-10-20 DIAGNOSIS — R030 Elevated blood-pressure reading, without diagnosis of hypertension: Secondary | ICD-10-CM | POA: Diagnosis not present

## 2020-10-21 ENCOUNTER — Ambulatory Visit: Payer: Medicare HMO

## 2020-10-21 ENCOUNTER — Ambulatory Visit (INDEPENDENT_AMBULATORY_CARE_PROVIDER_SITE_OTHER): Payer: Medicare HMO | Admitting: *Deleted

## 2020-10-21 DIAGNOSIS — Z0000 Encounter for general adult medical examination without abnormal findings: Secondary | ICD-10-CM | POA: Diagnosis not present

## 2020-10-21 NOTE — Progress Notes (Signed)
MEDICARE ANNUAL WELLNESS VISIT  10/21/2020  Telephone Visit Disclaimer This Medicare AWV was conducted by telephone due to national recommendations for restrictions regarding the COVID-19 Pandemic (e.g. social distancing).  I verified, using two identifiers, that I am speaking with Morgan Santos or their authorized healthcare agent. I discussed the limitations, risks, security, and privacy concerns of performing an evaluation and management service by telephone and the potential availability of an in-person appointment in the future. The patient expressed understanding and agreed to proceed.  Location of Patient: Home Location of Provider (nurse):  [facility]  Subjective:    Morgan Santos is a 70 y.o. female patient of Janora Norlander, DO who had a Medicare Annual Wellness Visit today via telephone. Morgan Santos is retired and lives with their spouse. She has 4 children. She reports that she is socially active and does interact with friends/family regularly. She is not physically active and enjoys volunteering.  Patient Care Team: Janora Norlander, DO as PCP - General (Family Medicine) Satira Sark, MD as PCP - Cardiology (Cardiology) Amada Kingfisher, MD as Consulting Physician (Hematology and Oncology) Danie Binder, MD (Inactive) as Consulting Physician (Gastroenterology) Norwood Levo, MD as Consulting Physician (Neurology)  Advanced Directives 10/21/2020 10/16/2019 10/10/2017 12/08/2016 06/11/2015 04/02/2015 03/26/2015  Does Patient Have a Medical Advance Directive? No Yes Yes No No No No  Type of Advance Directive - Out of facility DNR (pink MOST or yellow form);Healthcare Power of [place]  Does patient want to make changes to medical advance directive? No - Patient declined - - - - - -  Copy of Press photographer in Chart? - - Yes - - - -  Would patient like information on creating a medical advance directive? No - Patient declined - - - - No  - patient declined information No - patient declined information    Hospital Utilization Over the Past 12 Months: # of hospitalizations or ER visits: 0 # of surgeries: 0  Review of Systems    Patient reports that her overall health is better compared to last year.  History obtained from the patient and patient chart.   Patient Reported Readings (BP, Pulse, CBG, Weight, etc) none  Pain Assessment Pain : 0-10 Pain Score: 4  Pain Type: Chronic pain Pain Location: Back Pain Orientation: Lower Pain Descriptors / Indicators: Dull Pain Onset: More than a month ago Pain Frequency: Constant Pain Relieving Factors: exercise, chiropractor Effect of Pain on Daily Activities: none  Pain Relieving Factors: exercise, chiropractor  Current Medications & Allergies (verified) Allergies as of 10/21/2020      Reactions   Codeine Nausea And Vomiting   Crestor [rosuvastatin Calcium]    Muscle pain   Lipitor [atorvastatin]    Muscle pain   Namenda [memantine Hcl]    Nightmares    Peanut-containing Drug Products    Peanut butter triggers asthma    Multihance [gadobenate] Nausea Only, Cough   PT HAD INCREASED MUCOUS PRODUCTION AND PHLEGMY COUGH LASTING 10 MINS AFTER INJECTION, PT SENT HOME WITH BENADRYL, PREV NAUSEA AFTER GAD      Medication List       Accurate as of October 21, 2020  1:39 PM. If you have any questions, ask your nurse or doctor.        albuterol 108 (90 Base) MCG/ACT inhaler Commonly known as: VENTOLIN HFA Inhale 2 puffs into the lungs every 6 (six) hours as needed for wheezing  or shortness of breath. (whichever is covered by ins)   ALPRAZolam 0.5 MG tablet Commonly known as: XANAX Take 0.5-1 tablets (0.25-0.5 mg total) by mouth 2 (two) times daily as needed for anxiety.   ARTIFICIAL TEAR OP Place 1 drop into both eyes daily as needed (dry eyes).   aspirin EC 81 MG tablet Take 81 mg by mouth every other day.   B-12 2500 MCG Subl Place 2,500 mcg under the tongue  daily.   budesonide-formoterol 160-4.5 MCG/ACT inhaler Commonly known as: SYMBICORT Inhale 1 puff into the lungs 2 (two) times daily.   Calcium-Magnesium-Zinc 500-250-12.5 MG Tabs Take 1 tablet by mouth daily.   citalopram 40 MG tablet Commonly known as: CeleXA Take 1 tablet (40 mg total) by mouth daily.   magnesium oxide 400 MG tablet Commonly known as: MAG-OX Take 400 mg by mouth daily.   meloxicam 7.5 MG tablet Commonly known as: MOBIC Take 1 tablet (7.5 mg total) by mouth daily as needed for pain.   pravastatin 80 MG tablet Commonly known as: PRAVACHOL Take 1 tablet (80 mg total) by mouth daily.   Vitamin D-3 125 MCG (5000 UT) Tabs Take 10,000 Units by mouth daily.       History (reviewed): Past Medical History:  Diagnosis Date  . Anemia    when she was younger  . Anxiety   . Arthritis    hip- R, back; osteoarthritis  . Asthma   . Breast cancer ([place])    right  . Complication of anesthesia   . Constipation   . Depression    pt. remarks that she "takes respridal because if I don't take it I get angry"   . GERD (gastroesophageal reflux disease)    pt. uses vinegar for heartburn, ususally once per week   . H/O degenerative disc disease   . Hyperlipidemia   . Hypertension    "when I get upset"  Not on medication  . Memory loss   . PONV (postoperative nausea and vomiting)    "just for one day"  . Stroke [facility]) ?2010 or 2011   2010, [facility] for stroke "mini stroke" short term memory problems since   Past Surgical History:  Procedure Laterality Date  . BREAST RECONSTRUCTION WITH PLACEMENT OF TISSUE EXPANDER AND FLEX HD (ACELLULAR HYDRATED DERMIS) Right 07/12/2013   Procedure: RIGHT BREAST RECONSTRUCTION WITH PLACEMENT OF TISSUE EXPANDER AND FLEX HD TO RIGHT BREAST (ACELLULAR HYDRATED DERMIS);  Surgeon: Irene Limbo, MD;  Location: [place];  Service: Plastics;  Laterality: Right;  . BREAST SURGERY     for abcesses- removed from both breasts, many  yrs. ago  . CHOLECYSTECTOMY    . COLONOSCOPY   01/29/2004   NUR:A 7-8 mm polyp snared from the hepatic flexure.  Another 3 mm polyp was     cold snared from the sigmoid colon.External hemorrhoids, possible source of recent rectal bleeding  . COLONOSCOPY N/A 06/05/2014   Procedure: COLONOSCOPY;  Surgeon: Danie Binder, MD;  [facility];  Service: Endoscopy;  Laterality: N/A;  1030 - moved to 10:45 - Ginger to notify pt   . COLONOSCOPY WITH PROPOFOL N/A 10/17/2017   Procedure: COLONOSCOPY WITH PROPOFOL;  Surgeon: Danie Binder, MD;  [facility];  Service: Endoscopy;  Laterality: N/A;  10:30am  . DILATION AND CURETTAGE OF UTERUS    . LUMBAR SPINE SURGERY    . MASTECTOMY Right   . MASTECTOMY W/ SENTINEL NODE BIOPSY Right 07/12/2013   Procedure: MASTECTOMY  WITH SENTINEL LYMPH NODE BIOPSY;  Surgeon: Rolm Bookbinder, MD;  Location: [place];  Service: General;  Laterality: Right;  . MASTOPEXY Left 01/14/2014   Procedure: LEFT BREAST MASTOPEXY FOR SYMMETRY ;  Surgeon: Irene Limbo, MD;  Location: [place];  Service: Plastics;  Laterality: Left;  . PORT-A-CATH REMOVAL Left 01/14/2014   Procedure: REMOVAL PORT-A-CATH;  Surgeon: Irene Limbo, MD;  Location: [place];  Service: Plastics;  Laterality: Left;  . PORTACATH PLACEMENT N/A 09/16/2013   Procedure: INSERTION PORT-A-CATH;  Surgeon: Rolm Bookbinder, MD;  Location: [place];  Service: General;  Laterality: N/A;  . REDUCTION MAMMAPLASTY Left   . REMOVAL OF BILATERAL TISSUE EXPANDERS WITH PLACEMENT OF BILATERAL BREAST IMPLANTS Right 01/14/2014   Procedure: REMOVAL OF RIGHT TISSUE EXPANDERS WITH PLACEMENT OF PERMANENT BREAST IMPLANT;  Surgeon: Irene Limbo, MD;  Location: [place];  Service: Plastics;  Laterality: Right;  . TUBAL LIGATION    . VAGINAL HYSTERECTOMY     Family History  Problem Relation Age of Onset  . Hypertension Mother   . Diabetes type II Mother    . CVA Mother   . Heart disease Mother   . Osteoarthritis Mother   . Depression Mother   . Other Mother        degenerative disc disease  . Alcoholism Father   . Pneumonia Father   . Colon cancer Maternal Aunt        age 23s  . Colon cancer Other        maternal great aunt  . Diabetes Sister   . Hyperlipidemia Sister   . Hypertension Sister   . Cancer Brother   . Diabetes Sister   . Hypertension Sister   . Diabetes Sister   . Hypertension Sister   . Diabetes Sister   . Kidney disease Sister   . Colon polyps Neg Hx    Social History   Socioeconomic History  . Marital status: Married    Spouse name: [name]  . Number of children: 4  . Years of education: 12th  . Highest education level: High school graduate  Occupational History  . 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 Smoking status: Former Smoker    Packs/day: 0.25    Years: 2.00    Pack years: 0.50    Types: Cigarettes    Quit date: 09/14/1995    Years since quitting: 25.1  . Smokeless tobacco: Never Used  Vaping Use  . Vaping Use: Never used  Substance and Sexual Activity  . Alcohol use: Not Currently    Comment: maybe 3 beers a year  . Drug use: No    Types: Marijuana    Comment: in the past  . Sexual activity: Yes  Other Topics Concern  . Not on file  Social History Narrative   Lives at home with her husband.   Right-handed.   1 cup coffee per day, occasional soda.   Social Determinants of Health   Financial Resource Strain: Not on file  Food Insecurity: Not on file  Transportation Needs: Not on file  Physical Activity: Not on file  Stress: Not on file  Social Connections: Not on file    Activities of Daily Living In your present state of health, do you have any difficulty performing the following activities: 10/21/2020  Hearing? Y  Comment ringing in right ear  Vision? N  Difficulty concentrating or making decisions? Y  Comment concentrating  Walking  or climbing  stairs? N  Dressing or bathing? N  Doing errands, shopping? N  Preparing Food and eating ? N  Using the Toilet? N  In the past six months, have you accidently leaked urine? Y  Do you have problems with loss of bowel control? N  Managing your Medications? N  Managing your Finances? N  Housekeeping or managing your Housekeeping? Y  Comment hard to get to low surfaces  Some recent data might be hidden    Patient Education/ Literacy How often do you need to have someone help you when you read instructions, pamphlets, or other written materials from your doctor or pharmacy?: 1 - Never What is the last grade level you completed in school?: 12th  Exercise Current Exercise Habits: The patient does not participate in regular exercise at present, Exercise limited by: None identified  Diet Patient reports consuming 2 meals a day and 3 snack(s) a day Patient reports that her primary diet is: Low fat Patient reports that she does have regular access to food.   Depression Screen PHQ 2/9 Scores 10/21/2020 04/23/2020 04/23/2020 04/13/2020 01/24/2020 10/16/2019 09/24/2019  PHQ - 2 Score 0 1 0 0 1 1 5   PHQ- 9 Score - 11 - - 5 10 15      Fall Risk Fall Risk  10/21/2020 04/23/2020 04/13/2020 01/24/2020 10/16/2019  Falls in the past year? 0 0 0 0 0  Number falls in past yr: - - - - -  Injury with Fall? - - - - -  Risk Factor Category  - - - - -     Objective:  Morgan Santos seemed alert and oriented and she participated appropriately during our telephone visit.  Blood Pressure Weight BMI  BP Readings from Last 3 Encounters:  10/20/20 132/90  04/23/20 121/73  04/13/20 116/65   Wt Readings from Last 3 Encounters:  10/20/20 262 lb 12.8 oz (119.2 kg)  04/23/20 265 lb 12.8 oz (120.6 kg)  04/13/20 265 lb (120.2 kg)   BMI Readings from Last 1 Encounters:  10/20/20 37.17 kg/m    *Unable to obtain current vital signs, weight, and BMI due to telephone visit type  Hearing/Vision  . Morgan Santos did not seem to have  difficulty with hearing/understanding during the telephone conversation . Reports that she has not had a formal eye exam by an eye care professional within the past year . Reports that she has not had a formal hearing evaluation within the past year *Unable to fully assess hearing and vision during telephone visit type  Cognitive Function: 6CIT Screen 10/21/2020 10/16/2019 10/16/2019  What Year? 0 points - 0 points  What month? 0 points - 0 points  What time? 0 points - 0 points  Count back from 20 2 points - 0 points  Months in reverse 4 points - 2 points  Repeat phrase 2 points 6 points 6 points  Total Score 8 - 8   (Normal:0-7, Significant for Dysfunction: >8)  Normal Cognitive Function Screening: Yes   Immunization & Health Maintenance Record Immunization History  Administered Date(s) Administered  . PFIZER(Purple Top)SARS-COV-2 Vaccination 10/24/2019, 11/28/2019  . Td 09/06/2001    Health Maintenance  Topic Date Due  . TETANUS/TDAP  09/07/2011  . PNA vac Low Risk Adult (1 of 2 - PCV13) Never done  . COVID-19 Vaccine (4 - Booster) 05/29/2020  . MAMMOGRAM  09/20/2020  . INFLUENZA VACCINE  11/19/2020 (Originally 03/22/2020)  . COLONOSCOPY (Pts 45-78yrs Insurance coverage will need to be confirmed)  10/18/2027  . DEXA SCAN  Completed  . Hepatitis C Screening  Completed  . HPV VACCINES  Aged Out       Assessment  This is a routine wellness examination for Morgan Santos.  Health Maintenance: Due or Overdue Health Maintenance Due  Topic Date Due  . TETANUS/TDAP  09/07/2011  . PNA vac Low Risk Adult (1 of 2 - PCV13) Never done  . COVID-19 Vaccine (4 - Booster) 05/29/2020  . MAMMOGRAM  09/20/2020    Morgan Santos does not need a referral for Community Assistance: Care Management:   no Social Work:    no Prescription Assistance:  no Nutrition/Diabetes Education:  no   Plan:  Personalized Goals Goals Addressed            This Visit's Progress   . Patient Stated        10/21/2020 AWV Goal: Exercise for General Health   Patient will verbalize understanding of the benefits of increased physical activity:  Exercising regularly is important. It will improve your overall fitness, flexibility, and endurance.  Regular exercise also will improve your overall health. It can help you control your weight, reduce stress, and improve your bone density.  Over the next year, patient will increase physical activity as tolerated with a goal of at least 150 minutes of moderate physical activity per week.   You can tell that you are exercising at a moderate intensity if your heart starts beating faster and you start breathing faster but can still hold a conversation.  Moderate-intensity exercise ideas include:  Walking 1 mile (1.6 km) in about 15 minutes  Biking  Hiking  Golfing  Dancing  Water aerobics  Patient will verbalize understanding of everyday activities that increase physical activity by providing examples like the following: ? Yard work, such as: ? Pushing a Conservation officer, nature ? Raking and bagging leaves ? Washing your car ? Pushing a stroller ? Shoveling snow ? Gardening ? Washing windows or floors  Patient will be able to explain general safety guidelines for exercising:   Before you start a new exercise program, talk with your health care provider.  Do not exercise so much that you hurt yourself, feel dizzy, or get very short of breath.  Wear comfortable clothes and wear shoes with good support.  Drink plenty of water while you exercise to prevent dehydration or heat stroke.  Work out until your breathing and your heartbeat get faster.       Personalized Health Maintenance & Screening Recommendations  Pneumococcal vaccine   Lung Cancer Screening Recommended: no (Low Dose CT Chest recommended if Age 45-80 years, 30 pack-year currently smoking OR have quit w/in past 15 years) Hepatitis C Screening recommended: no HIV Screening  recommended: no  Advanced Directives: Written information was not prepared per patient's request.  Referrals & Orders No orders of the defined types were placed in this encounter.   Follow-up Plan . Follow-up with Janora Norlander, DO as planned   I have personally reviewed and noted the following in the patient's chart:   . Medical and social history . Use of alcohol, tobacco or illicit drugs  . Current medications and supplements . Functional ability and status . Nutritional status . Physical activity . Advanced directives . List of other physicians . Hospitalizations, surgeries, and ER visits in previous 12 months . Vitals . Screenings to include cognitive, depression, and falls . Referrals and appointments  In addition, I have reviewed and discussed with Morgan Santos  Morgan Santos certain preventive protocols, quality metrics, and best practice recommendations. A written personalized care plan for preventive services as well as general preventive health recommendations is available and can be mailed to the patient at her request.      Christia Reading, LPN  03/29/3373

## 2020-11-09 ENCOUNTER — Other Ambulatory Visit: Payer: Self-pay | Admitting: Family Medicine

## 2020-11-18 ENCOUNTER — Ambulatory Visit (INDEPENDENT_AMBULATORY_CARE_PROVIDER_SITE_OTHER): Payer: Medicare HMO | Admitting: Family Medicine

## 2020-11-18 ENCOUNTER — Encounter: Payer: Self-pay | Admitting: Family Medicine

## 2020-11-18 DIAGNOSIS — S70261A Insect bite (nonvenomous), right hip, initial encounter: Secondary | ICD-10-CM | POA: Diagnosis not present

## 2020-11-18 DIAGNOSIS — W57XXXA Bitten or stung by nonvenomous insect and other nonvenomous arthropods, initial encounter: Secondary | ICD-10-CM | POA: Diagnosis not present

## 2020-11-18 MED ORDER — DOXYCYCLINE HYCLATE 100 MG PO TABS: 100.0000 mg | ORAL_TABLET | Freq: Two times a day (BID) | ORAL | 0 refills | Status: AC

## 2020-11-18 NOTE — Progress Notes (Signed)
Virtual Visit via Telephone Note  I connected with Morgan Santos on 11/18/20 at 4:38 PM by telephone and verified that I am speaking with the correct person using two identifiers. Morgan Santos is currently located at home and her husband is currently with her during this visit. The provider, Loman Brooklyn, FNP is located in their home at time of visit.  I discussed the limitations, risks, security and privacy concerns of performing an evaluation and management service by telephone and the availability of in person appointments. I also discussed with the patient that there may be a patient responsible charge related to this service. The patient expressed understanding and agreed to proceed.  Subjective: PCP: Janora Norlander, DO  Chief Complaint  Patient presents with  . Tick Removal  . Bite   Patient reports she pulled a tick off of herself right above her right hip.  She reports the area is really itchy and a few days after doing so she developed chills, headaches, and joint aches.  She denies a rash, nausea, or vomiting.  Reports she has a history of Lyme disease in the past.   ROS: Per HPI  Current Outpatient Medications:  .  albuterol (VENTOLIN HFA) 108 (90 Base) MCG/ACT inhaler, Inhale 2 puffs into the lungs every 6 (six) hours as needed for wheezing or shortness of breath. (whichever is covered by ins), Disp: 18 g, Rfl: 0 .  ALPRAZolam (XANAX) 0.5 MG tablet, Take 0.5-1 tablets (0.25-0.5 mg total) by mouth 2 (two) times daily as needed for anxiety., Disp: 60 tablet, Rfl: 1 .  ARTIFICIAL TEAR OP, Place 1 drop into both eyes daily as needed (dry eyes)., Disp: , Rfl:  .  aspirin EC 81 MG tablet, Take 81 mg by mouth every other day., Disp: , Rfl:  .  budesonide-formoterol (SYMBICORT) 160-4.5 MCG/ACT inhaler, Inhale 1 puff into the lungs 2 (two) times daily., Disp: 1 Inhaler, Rfl: 3 .  Calcium-Magnesium-Zinc 500-250-12.5 MG TABS, Take 1 tablet by mouth daily. , Disp: , Rfl:  .   Cholecalciferol (VITAMIN D-3) 5000 UNITS TABS, Take 10,000 Units by mouth daily. , Disp: , Rfl:  .  citalopram (CELEXA) 40 MG tablet, Take 1 tablet (40 mg total) by mouth daily., Disp: 90 tablet, Rfl: 3 .  Cyanocobalamin (B-12) 2500 MCG SUBL, Place 2,500 mcg under the tongue daily., Disp: , Rfl:  .  magnesium oxide (MAG-OX) 400 MG tablet, Take 400 mg by mouth daily., Disp: , Rfl:  .  meloxicam (MOBIC) 7.5 MG tablet, Take 1 tablet (7.5 mg total) by mouth daily as needed for pain., Disp: 90 tablet, Rfl: 2 .  pravastatin (PRAVACHOL) 80 MG tablet, Take 1 tablet (80 mg total) by mouth daily., Disp: 90 tablet, Rfl: 0  Allergies  Allergen Reactions  . Codeine Nausea And Vomiting  . Crestor [Rosuvastatin Calcium]     Muscle pain  . Lipitor [Atorvastatin]     Muscle pain  . Namenda [Memantine Hcl]     Nightmares   . Peanut-Containing Drug Products     Peanut butter triggers asthma   . Multihance [Gadobenate] Nausea Only and Cough    PT HAD INCREASED MUCOUS PRODUCTION AND PHLEGMY COUGH LASTING 10 MINS AFTER INJECTION, PT SENT HOME WITH BENADRYL, PREV NAUSEA AFTER GAD   Past Medical History:  Diagnosis Date  . Anemia    when she was younger  . Anxiety   . Arthritis    hip- R, back; osteoarthritis  . Asthma   .  Breast cancer ([place])    right  . Complication of anesthesia   . Constipation   . Depression    pt. remarks that she "takes respridal because if I don't take it I get angry"   . GERD (gastroesophageal reflux disease)    pt. uses vinegar for heartburn, ususally once per week   . H/O degenerative disc disease   . Hyperlipidemia   . Hypertension    "when I get upset"  Not on medication  . Memory loss   . PONV (postoperative nausea and vomiting)    "just for one day"  . Stroke [facility]) ?2010 or 2011   2010, [facility] for stroke "mini stroke" short term memory problems since    Observations/Objective: A&O  No respiratory distress or wheezing audible over the phone Mood,  judgement, and thought processes all WNL   Assessment and Plan: 1. Tick bite of right hip, initial encounter - doxycycline (VIBRA-TABS) 100 MG tablet; Take 1 tablet (100 mg total) by mouth 2 (two) times daily for 28 days.  Dispense: 56 tablet; Refill: 0   Follow Up Instructions:  I discussed the assessment and treatment plan with the patient. The patient was provided an opportunity to ask questions and all were answered. The patient agreed with the plan and demonstrated an understanding of the instructions.   The patient was advised to call back or seek an in-person evaluation if the symptoms worsen or if the condition fails to improve as anticipated.  The above assessment and management plan was discussed with the patient. The patient verbalized understanding of and has agreed to the management plan. Patient is aware to call the clinic if symptoms persist or worsen. Patient is aware when to return to the clinic for a follow-up visit. Patient educated on when it is appropriate to go to the emergency department.   Time call ended: 4:49 PM  I provided 11 minutes of non-face-to-face time during this encounter.  Hendricks Limes, MSN, APRN, FNP-C [facility] 11/18/20

## 2020-11-19 ENCOUNTER — Ambulatory Visit
Admission: RE | Admit: 2020-11-19 | Discharge: 2020-11-19 | Disposition: A | Discharge status: Home / Self Care | Payer: Medicare HMO | Source: Ambulatory Visit | Attending: Physician Assistant | Admitting: Physician Assistant

## 2020-11-19 ENCOUNTER — Other Ambulatory Visit: Payer: Self-pay

## 2020-11-19 DIAGNOSIS — Z0000 Encounter for general adult medical examination without abnormal findings: Secondary | ICD-10-CM

## 2020-11-19 DIAGNOSIS — Z1231 Encounter for screening mammogram for malignant neoplasm of breast: Secondary | ICD-10-CM | POA: Diagnosis not present

## 2020-12-17 ENCOUNTER — Other Ambulatory Visit: Payer: Self-pay | Admitting: Family Medicine

## 2020-12-17 DIAGNOSIS — F419 Anxiety disorder, unspecified: Secondary | ICD-10-CM

## 2021-01-01 ENCOUNTER — Other Ambulatory Visit: Payer: Self-pay | Admitting: Family Medicine

## 2021-01-01 DIAGNOSIS — E785 Hyperlipidemia, unspecified: Secondary | ICD-10-CM

## 2021-01-12 DIAGNOSIS — E7800 Pure hypercholesterolemia, unspecified: Secondary | ICD-10-CM | POA: Diagnosis not present

## 2021-01-12 DIAGNOSIS — H52223 Regular astigmatism, bilateral: Secondary | ICD-10-CM | POA: Diagnosis not present

## 2021-01-12 DIAGNOSIS — Z0100 Encounter for examination of eyes and vision without abnormal findings: Secondary | ICD-10-CM | POA: Diagnosis not present

## 2021-01-14 ENCOUNTER — Telehealth: Payer: Self-pay | Admitting: Family Medicine

## 2021-01-15 ENCOUNTER — Other Ambulatory Visit: Payer: Self-pay | Admitting: Family Medicine

## 2021-01-15 DIAGNOSIS — F419 Anxiety disorder, unspecified: Secondary | ICD-10-CM

## 2021-02-11 ENCOUNTER — Encounter: Payer: Medicare HMO | Admitting: Family Medicine

## 2021-02-11 ENCOUNTER — Encounter: Payer: Self-pay | Admitting: Family Medicine

## 2021-02-11 NOTE — Progress Notes (Deleted)
Morgan Santos is a 70 y.o. female presents to office today for annual physical exam examination.    Concerns today include: 1. ***  Occupation: ***, Marital status: ***, Substance use: *** Diet: ***, Exercise: *** Last eye exam: *** Last dental exam: *** Last colonoscopy: *** Last mammogram: *** Last pap smear: *** Refills needed today: *** Immunizations needed: Flu Vaccine: {YES/NO/WILD NTIRW:43154}  Tdap Vaccine: {YES/NO/WILD MGQQP:61950}  - every 62yrs - (<3 lifetime doses or unknown): all wounds -- look up need for Tetanus IG - (>=3 lifetime doses): clean/minor wound if >60yrs from previous; all other wounds if >28yrs from previous Zoster Vaccine: {YES/NO/WILD CARDS:18581} (those >50yo, once) Pneumonia Vaccine: {YES/NO/WILD DTOIZ:12458} (those w/ risk factors) - (<33yr) Both: Immunocompromised, cochlear implant, CSF leak, asplenic, sickle cell, Chronic Renal Failure - (<63yr) PPSV-23 only: Heart dz, lung disease, DM, tobacco abuse, alcoholism, cirrhosis/liver disease. - (>81yr): PPSV13 then PPSV23 in 6-12mths;  - (>39yr): repeat PPSV23 once if pt received prior to 70yo and 75yrs have passed  Past Medical History:  Diagnosis Date   Anemia    when she was younger   Anxiety    Arthritis    hip- R, back; osteoarthritis   Asthma    Breast cancer ([place])    right   Complication of anesthesia    Constipation    Depression    pt. remarks that she "takes respridal because if I don't take it I get angry"    GERD (gastroesophageal reflux disease)    pt. uses vinegar for heartburn, ususally once per week    H/O degenerative disc disease    Hyperlipidemia    Hypertension    "when I get upset"  Not on medication   Memory loss    PONV (postoperative nausea and vomiting)    "just for one day"   Stroke [facility]) ?2010 or 2011   2010, [facility] for stroke "mini stroke" short term memory problems since   Social History   Socioeconomic History   Marital status: Married     Spouse name: [name]   Number of children: 4   Years of education: 12th   Highest education level: High school graduate  Occupational History   Occupation: retired 2014    Comment: Charity fundraiser  Tobacco Use   Smoking status: Former    Packs/day: 0.25    Years: 2.00    Pack years: 0.50    Types: Cigarettes    Quit date: 09/14/1995    Years since quitting: 25.4   Smokeless tobacco: Never  Vaping Use   Vaping Use: Never used  Substance and Sexual Activity   Alcohol use: Not Currently    Comment: maybe 3 beers a year   Drug use: No    Types: Marijuana    Comment: in the past   Sexual activity: Yes  Other Topics Concern   Not on file  Social History Narrative   Lives at home with her husband.   Right-handed.   1 cup coffee per day, occasional soda.   Social Determinants of Health   Financial Resource Strain: Not on file  Food Insecurity: Not on file  Transportation Needs: Not on file  Physical Activity: Not on file  Stress: Not on file  Social Connections: Not on file  Intimate Partner Violence: Not on file   Past Surgical History:  Procedure Laterality Date   BREAST RECONSTRUCTION WITH PLACEMENT OF TISSUE EXPANDER AND FLEX HD (ACELLULAR HYDRATED DERMIS) Right 07/12/2013   Procedure: RIGHT BREAST RECONSTRUCTION  WITH PLACEMENT OF TISSUE EXPANDER AND FLEX HD TO RIGHT BREAST (ACELLULAR HYDRATED DERMIS);  Surgeon: Irene Limbo, MD;  Location: [place];  Service: Plastics;  Laterality: Right;   BREAST SURGERY     for abcesses- removed from both breasts, many yrs. ago   CHOLECYSTECTOMY     COLONOSCOPY   01/29/2004   NUR:A 7-8 mm polyp snared from the hepatic flexure.  Another 3 mm polyp was     cold snared from the sigmoid colon.External hemorrhoids, possible source of recent rectal bleeding   COLONOSCOPY N/A 06/05/2014   Procedure: COLONOSCOPY;  Surgeon: Danie Binder, MD;  [facility];  Service: Endoscopy;  Laterality: N/A;  1030 - moved to 10:45 - Ginger to notify  pt    COLONOSCOPY WITH PROPOFOL N/A 10/17/2017   Procedure: COLONOSCOPY WITH PROPOFOL;  Surgeon: Danie Binder, MD;  [facility];  Service: Endoscopy;  Laterality: N/A;  10:30am   DILATION AND CURETTAGE OF UTERUS     LUMBAR SPINE SURGERY     MASTECTOMY Right    MASTECTOMY W/ SENTINEL NODE BIOPSY Right 07/12/2013   Procedure: MASTECTOMY WITH SENTINEL LYMPH NODE BIOPSY;  Surgeon: Rolm Bookbinder, MD;  Location: [place];  Service: General;  Laterality: Right;   MASTOPEXY Left 01/14/2014   Procedure: LEFT BREAST MASTOPEXY FOR SYMMETRY ;  Surgeon: Irene Limbo, MD;  Location: [place];  Service: Plastics;  Laterality: Left;   PORT-A-CATH REMOVAL Left 01/14/2014   Procedure: REMOVAL PORT-A-CATH;  Surgeon: Irene Limbo, MD;  Location: [place];  Service: Plastics;  Laterality: Left;   PORTACATH PLACEMENT N/A 09/16/2013   Procedure: INSERTION PORT-A-CATH;  Surgeon: Rolm Bookbinder, MD;  Location: [place];  Service: General;  Laterality: N/A;   REDUCTION MAMMAPLASTY Left    REMOVAL OF BILATERAL TISSUE EXPANDERS WITH PLACEMENT OF BILATERAL BREAST IMPLANTS Right 01/14/2014   Procedure: REMOVAL OF RIGHT TISSUE EXPANDERS WITH PLACEMENT OF PERMANENT BREAST IMPLANT;  Surgeon: Irene Limbo, MD;  Location: [place];  Service: Plastics;  Laterality: Right;   TUBAL LIGATION     VAGINAL HYSTERECTOMY     Family History  Problem Relation Age of Onset   Hypertension Mother    Diabetes type II Mother    CVA Mother    Heart disease Mother    Osteoarthritis Mother    Depression Mother    Other Mother        degenerative disc disease   Alcoholism Father    Pneumonia Father    Colon cancer Maternal Aunt        age 2s   Colon cancer Other        maternal great aunt   Diabetes Sister    Hyperlipidemia Sister    Hypertension Sister    Cancer Brother    Diabetes Sister    Hypertension Sister    Diabetes Sister    Hypertension Sister     Diabetes Sister    Kidney disease Sister    Colon polyps Neg Hx     Current Outpatient Medications:    albuterol (VENTOLIN HFA) 108 (90 Base) MCG/ACT inhaler, Inhale 2 puffs into the lungs every 6 (six) hours as needed for wheezing or shortness of breath. (whichever is covered by ins), Disp: 18 g, Rfl: 0   ALPRAZolam (XANAX) 0.5 MG tablet, Take 0.5-1 tablets (0.25-0.5 mg total) by mouth 2 (two) times daily as needed for anxiety., Disp: 60 tablet, Rfl: 1   ARTIFICIAL TEAR OP, Place  1 drop into both eyes daily as needed (dry eyes)., Disp: , Rfl:    aspirin EC 81 MG tablet, Take 81 mg by mouth every other day., Disp: , Rfl:    budesonide-formoterol (SYMBICORT) 160-4.5 MCG/ACT inhaler, Inhale 1 puff into the lungs 2 (two) times daily., Disp: 1 Inhaler, Rfl: 3   Calcium-Magnesium-Zinc 500-250-12.5 MG TABS, Take 1 tablet by mouth daily. , Disp: , Rfl:    Cholecalciferol (VITAMIN D-3) 5000 UNITS TABS, Take 10,000 Units by mouth daily. , Disp: , Rfl:    citalopram (CELEXA) 40 MG tablet, Take 1 tablet (40 mg total) by mouth daily., Disp: 90 tablet, Rfl: 3   Cyanocobalamin (B-12) 2500 MCG SUBL, Place 2,500 mcg under the tongue daily., Disp: , Rfl:    magnesium oxide (MAG-OX) 400 MG tablet, Take 400 mg by mouth daily., Disp: , Rfl:    meloxicam (MOBIC) 7.5 MG tablet, Take 1 tablet (7.5 mg total) by mouth daily as needed for pain., Disp: 90 tablet, Rfl: 2   pravastatin (PRAVACHOL) 80 MG tablet, TAKE 1 TABLET EVERY DAY, Disp: 90 tablet, Rfl: 0  Allergies  Allergen Reactions   Codeine Nausea And Vomiting   Crestor [Rosuvastatin Calcium]     Muscle pain   Lipitor [Atorvastatin]     Muscle pain   Namenda [Memantine Hcl]     Nightmares    Peanut-Containing Drug Products     Peanut butter triggers asthma    Multihance [Gadobenate] Nausea Only and Cough    PT HAD INCREASED MUCOUS PRODUCTION AND PHLEGMY COUGH LASTING 10 MINS AFTER INJECTION, PT SENT HOME WITH BENADRYL, PREV NAUSEA AFTER GAD      ROS: Review of Systems {ros; complete:30496}    Physical exam {Exam, [phone]}    Assessment/ Plan: Kathlen Brunswick here for annual physical exam.   No problem-specific Assessment & Plan notes found for this encounter.   Counseled on healthy lifestyle choices, including diet (rich in fruits, vegetables and lean meats and low in salt and simple carbohydrates) and exercise (at least 30 minutes of moderate physical activity daily).  Patient to follow up in 1 year for annual exam or sooner if needed.  Elsie Sakuma M. Lajuana Ripple, DO

## 2021-03-10 NOTE — Progress Notes (Signed)
Patient Care Team: Janora Norlander, DO as PCP - General (Family Medicine) Satira Sark, MD as PCP - Cardiology (Cardiology) Amada Kingfisher, MD as Consulting Physician (Hematology and Oncology) Danie Binder, MD (Inactive) as Consulting Physician (Gastroenterology) Norwood Levo, MD as Consulting Physician (Neurology)  DIAGNOSIS:    ICD-10-CM   1. Malignant neoplasm of upper-outer quadrant of right breast in female, estrogen receptor positive ([place])  C50.411    Z17.0       SUMMARY OF ONCOLOGIC HISTORY: Oncology History  Breast cancer of upper-outer quadrant of right female breast ([place])  04/29/2013 Mammogram   Right breast mass, 7 mm lesion at 9:00 position, IDC with DCIS grade 2 ER  PR 99% positive HER-2 negative Ki-67 11%: Second mass IDC ER/PR positive HER-2 negative Ki-67 11%, MRI 6 cm including enhancement     07/12/2013 Surgery   Right breast mastectomy: IDC grade 1, 2 foci 1.2 and 1.1 cm, LVI positive, 2 SLN negative, Oncotype DX score 23 intermediate risk, ROR. 14%    09/26/2013 - 11/28/2013 Chemotherapy   Adjuvant Taxotere and Cytoxan x4 cycles    01/14/2014 Surgery   Right breast reconstruction and right port removal    01/17/2014 - 03/06/2020 Anti-estrogen oral therapy   anastrozole 1 mg daily      CHIEF COMPLIANT: Follow-up of right breast cancer    INTERVAL HISTORY: Morgan Santos is a 70 y.o. with above-mentioned history of right breast cancer treated with mastectomy with reconstruction, adjuvant chemotherapy, completed anastrozole therapy. Mammogram on 11/19/20 showed no evidence of malignancy. She presents to the clinic today for annual follow-up.  She continues to gain weight in spite of stopping anastrozole therapy.  She tells me that she has not been watching what she eats and she is not exercising as well.  Denies any lumps or nodules in the breast.  She has intermittent pains in the right breast.  Mammograms in April were normal.  ALLERGIES:  is  allergic to codeine, crestor [rosuvastatin calcium], lipitor [atorvastatin], namenda [memantine hcl], peanut-containing drug products, and multihance [gadobenate].  MEDICATIONS:  Current Outpatient Medications  Medication Sig Dispense Refill   albuterol (VENTOLIN HFA) 108 (90 Base) MCG/ACT inhaler Inhale 2 puffs into the lungs every 6 (six) hours as needed for wheezing or shortness of breath. (whichever is covered by ins) 18 g 0   ALPRAZolam (XANAX) 0.5 MG tablet Take 0.5-1 tablets (0.25-0.5 mg total) by mouth 2 (two) times daily as needed for anxiety. 60 tablet 1   ARTIFICIAL TEAR OP Place 1 drop into both eyes daily as needed (dry eyes).     aspirin EC 81 MG tablet Take 81 mg by mouth every other day.     budesonide-formoterol (SYMBICORT) 160-4.5 MCG/ACT inhaler Inhale 1 puff into the lungs 2 (two) times daily. 1 Inhaler 3   Calcium-Magnesium-Zinc 500-250-12.5 MG TABS Take 1 tablet by mouth daily.      Cholecalciferol (VITAMIN D-3) 5000 UNITS TABS Take 10,000 Units by mouth daily.      citalopram (CELEXA) 40 MG tablet Take 1 tablet (40 mg total) by mouth daily. 90 tablet 3   Cyanocobalamin (B-12) 2500 MCG SUBL Place 2,500 mcg under the tongue daily.     magnesium oxide (MAG-OX) 400 MG tablet Take 400 mg by mouth daily.     meloxicam (MOBIC) 7.5 MG tablet Take 1 tablet (7.5 mg total) by mouth daily as needed for pain. 90 tablet 2   pravastatin (PRAVACHOL) 80 MG tablet TAKE 1 TABLET EVERY  DAY 90 tablet 0   No current facility-administered medications for this visit.    PHYSICAL EXAMINATION: ECOG PERFORMANCE STATUS: 1 - Symptomatic but completely ambulatory  Vitals:   03/11/21 1005  BP: (!) 149/72  Pulse: 71  Resp: 18  Temp: 97.8 F (36.6 C)  SpO2: 96%   Filed Weights   03/11/21 1005  Weight: 258 lb 14.4 oz (117.4 kg)      LABORATORY DATA:  I have reviewed the data as listed CMP Latest Ref Rng & Units 09/24/2019 06/04/2019 12/27/2017  Glucose 65 - 99 mg/dL 93 98 90  BUN 8 - 27  mg/dL 12 13 10   Creatinine 0.57 - 1.00 mg/dL 0.71 0.73 0.74  Sodium 134 - 144 mmol/L 140 139 141  Potassium 3.5 - 5.2 mmol/L 4.2 4.1 4.1  Chloride 96 - 106 mmol/L 101 103 104  CO2 20 - 29 mmol/L 25 25 23   Calcium 8.7 - 10.3 mg/dL 9.5 9.3 9.3  Total Protein 6.0 - 8.5 g/dL 6.9 6.4 6.3  Total Bilirubin 0.0 - 1.2 mg/dL 0.7 0.6 0.6  Alkaline Phos 39 - 117 IU/L 137(H) 130(H) 140(H)  AST 0 - 40 IU/L 21 24 22   ALT 0 - 32 IU/L 16 22 19     Lab Results  Component Value Date   WBC 5.0 09/24/2019   HGB 13.2 09/24/2019   HCT 40.3 09/24/2019   MCV 89 09/24/2019   PLT 229 09/24/2019   NEUTROABS 2.2 09/24/2019    ASSESSMENT & PLAN:  Breast cancer of upper-outer quadrant of right female breast ([place]) 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 ER 99% PR 99% HER-2 negative: Intermediate risk Oncotype DX recurrence score 23. Patient received 4 cycles of Taxotere Cytoxan chemotherapy. She had breast reconstruction subsequently in May 2015, started Arimidex May 2015 discontinued 03/06/2020 (due to weight gain)   Surveillance:   1. Breast exam 03/11/2020: Benign 2. Mammogram 11/20/2020 is benign density category B 3. Bone density April 2015 showed T score -0.6    Return to clinic on an as-needed basis    No orders of the defined types were placed in this encounter.  The patient has a good understanding of the overall plan. she agrees with it. she will call with any problems that may develop before the next visit here.  Total time spent: 20 mins including face to face time and time spent for planning, charting and coordination of care  Rulon Eisenmenger, MD, MPH 03/11/2021  I, Thana Ates, am acting as scribe for Dr. Nicholas Lose.  I have reviewed the above documentation for accuracy and completeness, and I agree with the above.

## 2021-03-11 ENCOUNTER — Inpatient Hospital Stay: Payer: Medicare HMO | Attending: Hematology and Oncology | Admitting: Hematology and Oncology

## 2021-03-11 ENCOUNTER — Other Ambulatory Visit: Payer: Self-pay

## 2021-03-11 DIAGNOSIS — Z170 Estrogen receptor positive status [ER+]: Secondary | ICD-10-CM

## 2021-03-11 DIAGNOSIS — Z853 Personal history of malignant neoplasm of breast: Secondary | ICD-10-CM | POA: Diagnosis not present

## 2021-03-11 DIAGNOSIS — C50411 Malignant neoplasm of upper-outer quadrant of right female breast: Secondary | ICD-10-CM

## 2021-03-11 NOTE — Assessment & Plan Note (Signed)
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 ER 99% PR 99% HER-2 negative: Intermediate risk Oncotype DX recurrence score 23. Patient received 4 cycles of Taxotere Cytoxan chemotherapy. She had breast reconstruction subsequently in May 2015, started Arimidex May 2015 discontinued 03/06/2020 (due to weight gain)  Surveillance:  1. Breast exam 03/11/2021: Benign 2. Mammogram 4/1/2022isbenigndensity category B 3. Bone density April 2015 showed T score -0.6  Return to clinic 1 year for follow-up

## 2021-03-25 ENCOUNTER — Telehealth: Payer: Self-pay | Admitting: Family Medicine

## 2021-03-25 NOTE — Telephone Encounter (Signed)
I contacted mail order pharmacy.  It was supposed to be #90/90 day supply plus 3 refills. However, the patient on her own opted for partial refills. Patient received #45 on 04/25/20, #45 on 06/11/2020, #35 on 07/22/2020, #90 08/27/2020 and #90 on 10/24/2019. This is why she is out of refills and needing a new script.  She has a physical coming up in 2 weeks (04/09/2021)

## 2021-03-25 NOTE — Telephone Encounter (Signed)
Pt called stating that she needs refills on her Celexa Rx because she is completely out and has been since April.   Explained to pt that she should have enough to last her until 04/23/21 because when we sent Rx to pharmacy last, it was for a year supply.  Pt says the pharmacy is telling her that she doesn't have anymore refills.  Please advise and call patient.

## 2021-03-25 NOTE — Telephone Encounter (Signed)
PLEASE REVIEW REFILL WAS SUPPOSE TO COME BACK IN 03/22

## 2021-03-26 ENCOUNTER — Other Ambulatory Visit: Payer: Self-pay | Admitting: Family Medicine

## 2021-03-26 DIAGNOSIS — F419 Anxiety disorder, unspecified: Secondary | ICD-10-CM

## 2021-03-26 MED ORDER — CITALOPRAM HYDROBROMIDE 40 MG PO TABS: 40.0000 mg | ORAL_TABLET | Freq: Every day | ORAL | 3 refills | Status: AC

## 2021-04-06 DIAGNOSIS — R69 Illness, unspecified: Secondary | ICD-10-CM | POA: Diagnosis not present

## 2021-04-09 ENCOUNTER — Other Ambulatory Visit: Payer: Self-pay

## 2021-04-09 ENCOUNTER — Ambulatory Visit (INDEPENDENT_AMBULATORY_CARE_PROVIDER_SITE_OTHER): Payer: Medicare HMO | Admitting: Family Medicine

## 2021-04-09 ENCOUNTER — Encounter: Payer: Self-pay | Admitting: Family Medicine

## 2021-04-09 VITALS — BP 161/95 | HR 70 | Temp 97.9°F | Ht 70.5 in | Wt 259.0 lb

## 2021-04-09 DIAGNOSIS — F331 Major depressive disorder, recurrent, moderate: Secondary | ICD-10-CM

## 2021-04-09 DIAGNOSIS — R457 State of emotional shock and stress, unspecified: Secondary | ICD-10-CM

## 2021-04-09 DIAGNOSIS — E7800 Pure hypercholesterolemia, unspecified: Secondary | ICD-10-CM

## 2021-04-09 DIAGNOSIS — K068 Other specified disorders of gingiva and edentulous alveolar ridge: Secondary | ICD-10-CM

## 2021-04-09 DIAGNOSIS — Z0001 Encounter for general adult medical examination with abnormal findings: Secondary | ICD-10-CM | POA: Diagnosis not present

## 2021-04-09 DIAGNOSIS — R030 Elevated blood-pressure reading, without diagnosis of hypertension: Secondary | ICD-10-CM

## 2021-04-09 DIAGNOSIS — Z79899 Other long term (current) drug therapy: Secondary | ICD-10-CM

## 2021-04-09 DIAGNOSIS — Z130 Encounter for screening for diseases of the blood and blood-forming organs and certain disorders involving the immune mechanism: Secondary | ICD-10-CM | POA: Diagnosis not present

## 2021-04-09 DIAGNOSIS — Z0000 Encounter for general adult medical examination without abnormal findings: Secondary | ICD-10-CM

## 2021-04-09 DIAGNOSIS — F419 Anxiety disorder, unspecified: Secondary | ICD-10-CM

## 2021-04-09 MED ORDER — ALPRAZOLAM 0.5 MG PO TABS: 0.2500 mg | ORAL_TABLET | Freq: Two times a day (BID) | ORAL | 1 refills | Status: AC | PRN

## 2021-04-09 MED ORDER — LIDOCAINE VISCOUS HCL 2 % MT SOLN: OROMUCOSAL | 0 refills | Status: DC

## 2021-04-09 NOTE — Patient Instructions (Signed)
We discussed the dangers of your medication today.  Xanax can cause hallucinations, falls, breathing problems, dementia and even death.    You had labs performed today.  You will be contacted with the results of the labs once they are available, usually in the next 3 business days for routine lab work.  If you have an active my chart account, they will be released to your MyChart.  If you prefer to have these labs released to you via telephone, please let us know.

## 2021-04-09 NOTE — Progress Notes (Signed)
Morgan Santos is a 70 y.o. female presents to office today for annual physical exam examination.    Concerns today include: 1.  Anxiety disorder Patient continues to have quite a bit of situational anxiety.  She uses her alprazolam sparingly.  She has not taken it since March.  She is compliant with her Celexa 40 mg daily.  Her husband continues to be dependent on her for a lot of his care.  She has stopped working out at [employer] as a result.  She identifies that she needs to get back however.  2.  Gum pain Patient has had all of her teeth extracted now.  She is waiting on getting new teeth.  She is currently treated with clindamycin but continues to have some pain.  She takes meloxicam 7.5 mg daily.  3.  Hyperlipidemia Compliant with pravastatin 80 mg daily.  No chest pain, shortness of breath.  Occupation: Retired, Marital status: Married, Substance use: None Diet: Fair, Exercise: No structured Last eye exam: Due Last dental exam: Up-to-date.  Status post total extractions Last colonoscopy: Up-to-date Last mammogram: Up-to-date Last pap smear: N/A Refills needed today: Xanax Immunizations needed: Immunization History  Administered Date(s) Administered   PFIZER(Purple Top)SARS-COV-2 Vaccination 10/24/2019, 11/08/2019, 11/28/2019   Td 09/06/2001     Past Medical History:  Diagnosis Date   Anemia    when she was younger   Anxiety    Arthritis    hip- R, back; osteoarthritis   Asthma    Breast cancer ([place])    right   Complication of anesthesia    Constipation    Depression    pt. remarks that she "takes respridal because if I don't take it I get angry"    GERD (gastroesophageal reflux disease)    pt. uses vinegar for heartburn, ususally once per week    H/O degenerative disc disease    Hyperlipidemia    Hypertension    "when I get upset"  Not on medication   Memory loss    PONV (postoperative nausea and vomiting)    "just for one day"   Stroke [facility]) ?2010 or 2011    2010, [facility] for stroke "mini stroke" short term memory problems since   Social History   Socioeconomic History   Marital status: Married    Spouse name: [name]   Number of children: 4   Years of education: 12th   Highest education level: High school graduate  Occupational History   Occupation: retired 2014    Comment: Charity fundraiser  Tobacco Use   Smoking status: Former    Packs/day: 0.25    Years: 2.00    Pack years: 0.50    Types: Cigarettes    Quit date: 09/14/1995    Years since quitting: 25.5   Smokeless tobacco: Never  Vaping Use   Vaping Use: Never used  Substance and Sexual Activity   Alcohol use: Not Currently    Comment: maybe 3 beers a year   Drug use: No    Types: Marijuana    Comment: in the past   Sexual activity: Yes  Other Topics Concern   Not on file  Social History Narrative   Lives at home with her husband.   Right-handed.   1 cup coffee per day, occasional soda.   Social Determinants of Health   Financial Resource Strain: Not on file  Food Insecurity: Not on file  Transportation Needs: Not on file  Physical Activity: Not on file  Stress:  Not on file  Social Connections: Not on file  Intimate Partner Violence: Not on file   Past Surgical History:  Procedure Laterality Date   BREAST RECONSTRUCTION WITH PLACEMENT OF TISSUE EXPANDER AND FLEX HD (ACELLULAR HYDRATED DERMIS) Right 07/12/2013   Procedure: RIGHT BREAST RECONSTRUCTION WITH PLACEMENT OF TISSUE EXPANDER AND FLEX HD TO RIGHT BREAST (ACELLULAR HYDRATED DERMIS);  Surgeon: Irene Limbo, MD;  Location: [place];  Service: Plastics;  Laterality: Right;   BREAST SURGERY     for abcesses- removed from both breasts, many yrs. ago   CHOLECYSTECTOMY     COLONOSCOPY   01/29/2004   NUR:A 7-8 mm polyp snared from the hepatic flexure.  Another 3 mm polyp was     cold snared from the sigmoid colon.External hemorrhoids, possible source of recent rectal bleeding   COLONOSCOPY N/A 06/05/2014    Procedure: COLONOSCOPY;  Surgeon: Danie Binder, MD;  [facility];  Service: Endoscopy;  Laterality: N/A;  1030 - moved to 10:45 - Ginger to notify pt    COLONOSCOPY WITH PROPOFOL N/A 10/17/2017   Procedure: COLONOSCOPY WITH PROPOFOL;  Surgeon: Danie Binder, MD;  [facility];  Service: Endoscopy;  Laterality: N/A;  10:30am   DILATION AND CURETTAGE OF UTERUS     LUMBAR SPINE SURGERY     MASTECTOMY Right    MASTECTOMY W/ SENTINEL NODE BIOPSY Right 07/12/2013   Procedure: MASTECTOMY WITH SENTINEL LYMPH NODE BIOPSY;  Surgeon: Rolm Bookbinder, MD;  Location: [place];  Service: General;  Laterality: Right;   MASTOPEXY Left 01/14/2014   Procedure: LEFT BREAST MASTOPEXY FOR SYMMETRY ;  Surgeon: Irene Limbo, MD;  Location: [place];  Service: Plastics;  Laterality: Left;   PORT-A-CATH REMOVAL Left 01/14/2014   Procedure: REMOVAL PORT-A-CATH;  Surgeon: Irene Limbo, MD;  Location: [place];  Service: Plastics;  Laterality: Left;   PORTACATH PLACEMENT N/A 09/16/2013   Procedure: INSERTION PORT-A-CATH;  Surgeon: Rolm Bookbinder, MD;  Location: [place];  Service: General;  Laterality: N/A;   REDUCTION MAMMAPLASTY Left    REMOVAL OF BILATERAL TISSUE EXPANDERS WITH PLACEMENT OF BILATERAL BREAST IMPLANTS Right 01/14/2014   Procedure: REMOVAL OF RIGHT TISSUE EXPANDERS WITH PLACEMENT OF PERMANENT BREAST IMPLANT;  Surgeon: Irene Limbo, MD;  Location: [place];  Service: Plastics;  Laterality: Right;   TUBAL LIGATION     VAGINAL HYSTERECTOMY     Family History  Problem Relation Age of Onset   Hypertension Mother    Diabetes type II Mother    CVA Mother    Heart disease Mother    Osteoarthritis Mother    Depression Mother    Other Mother        degenerative disc disease   Alcoholism Father    Pneumonia Father    Colon cancer Maternal Aunt        age 18s   Colon cancer Other        maternal great aunt   Diabetes  Sister    Hyperlipidemia Sister    Hypertension Sister    Cancer Brother    Diabetes Sister    Hypertension Sister    Diabetes Sister    Hypertension Sister    Diabetes Sister    Kidney disease Sister    Colon polyps Neg Hx     Current Outpatient Medications:    albuterol (VENTOLIN HFA) 108 (90 Base) MCG/ACT inhaler, Inhale 2 puffs into the lungs every 6 (six) hours as needed for wheezing  or shortness of breath. (whichever is covered by ins), Disp: 18 g, Rfl: 0   ALPRAZolam (XANAX) 0.5 MG tablet, Take 0.5-1 tablets (0.25-0.5 mg total) by mouth 2 (two) times daily as needed for anxiety., Disp: 60 tablet, Rfl: 1   ARTIFICIAL TEAR OP, Place 1 drop into both eyes daily as needed (dry eyes)., Disp: , Rfl:    aspirin EC 81 MG tablet, Take 81 mg by mouth every other day., Disp: , Rfl:    Calcium-Magnesium-Zinc 500-250-12.5 MG TABS, Take 1 tablet by mouth daily. , Disp: , Rfl:    Cholecalciferol (VITAMIN D-3) 5000 UNITS TABS, Take 10,000 Units by mouth daily. , Disp: , Rfl:    citalopram (CELEXA) 40 MG tablet, Take 1 tablet (40 mg total) by mouth daily., Disp: 90 tablet, Rfl: 3   clindamycin (CLEOCIN) 150 MG capsule, Take by mouth 3 (three) times daily., Disp: , Rfl:    Cyanocobalamin (B-12) 2500 MCG SUBL, Place 2,500 mcg under the tongue daily., Disp: , Rfl:    magnesium oxide (MAG-OX) 400 MG tablet, Take 400 mg by mouth daily., Disp: , Rfl:    meloxicam (MOBIC) 7.5 MG tablet, Take 1 tablet (7.5 mg total) by mouth daily as needed for pain., Disp: 90 tablet, Rfl: 2   pravastatin (PRAVACHOL) 80 MG tablet, TAKE 1 TABLET EVERY DAY, Disp: 90 tablet, Rfl: 0   budesonide-formoterol (SYMBICORT) 160-4.5 MCG/ACT inhaler, Inhale 1 puff into the lungs 2 (two) times daily. (Patient not taking: Reported on 04/09/2021), Disp: 1 Inhaler, Rfl: 3  Allergies  Allergen Reactions   Codeine Nausea And Vomiting   Crestor [Rosuvastatin Calcium]     Muscle pain   Lipitor [Atorvastatin]     Muscle pain   Namenda  [Memantine Hcl]     Nightmares    Peanut-Containing Drug Products     Peanut butter triggers asthma    Multihance [Gadobenate] Nausea Only and Cough    PT HAD INCREASED MUCOUS PRODUCTION AND PHLEGMY COUGH LASTING 10 MINS AFTER INJECTION, PT SENT HOME WITH BENADRYL, PREV NAUSEA AFTER GAD     ROS: Review of Systems Pertinent items noted in HPI and remainder of comprehensive ROS otherwise negative.    Physical exam BP (!) 161/95   Pulse 70   Temp 97.9 F (36.6 C)   Ht 5' 10.5" (1.791 m)   Wt 259 lb (117.5 kg)   SpO2 94%   BMI 36.64 kg/m  General appearance: alert, cooperative, appears stated age, and no distress Head: Normocephalic, without obvious abnormality, atraumatic Eyes: negative findings: lids and lashes normal, conjunctivae and sclerae normal, corneas clear, and pupils equal, round, reactive to light and accomodation Ears: normal TM's and external ear canals both ears Nose: Nares normal. Septum midline. Mucosa normal. No drainage or sinus tenderness. Throat: Teeth are surgically absent.  She has artificial teeth up top.  Small ulceration noted along the right inferior gumline Neck: no adenopathy, no carotid bruit, no JVD, and supple, symmetrical, trachea midline Back: symmetric, no curvature. ROM normal. No CVA tenderness. Lungs: clear to auscultation bilaterally Heart: regular rate and rhythm, S1, S2 normal, no murmur, click, rub or gallop Abdomen: soft, non-tender; bowel sounds normal; no masses,  no organomegaly Extremities: extremities normal, atraumatic, no cyanosis or edema Pulses: 2+ and symmetric Skin: Skin color, texture, turgor normal. No rashes or lesions Lymph nodes: Cervical, supraclavicular, and axillary nodes normal. Neurologic: Alert and oriented X 3, normal strength and tone. Normal symmetric reflexes. Normal coordination and gait Psych: Mood stable, speech normal  Depression screen [facility] 2/9 04/09/2021 10/21/2020 04/23/2020  Decreased Interest 2 0 0  Down,  Depressed, Hopeless 2 0 1  PHQ - 2 Score 4 0 1  Altered sleeping 3 - 3  Tired, decreased energy 1 - 1  Change in appetite 3 - 2  Feeling bad or failure about yourself  2 - 1  Trouble concentrating 2 - 3  Moving slowly or fidgety/restless 0 - 0  Suicidal thoughts 0 - 0  PHQ-9 Score 15 - 11  Difficult doing work/chores Somewhat difficult - Not difficult at all  Some recent data might be hidden   GAD 7 : Generalized Anxiety Score 04/09/2021 01/24/2020 09/24/2019  Nervous, Anxious, on Edge 3 1 1   Control/stop worrying 3 1 1   Worry too much - different things 2 1 1   Trouble relaxing 3 1 0  Restless 1 0 0  Easily annoyed or irritable 3 0 3  Afraid - awful might happen 3 1 3   Total GAD 7 Score 18 5 9   Anxiety Difficulty Somewhat difficult - Somewhat difficult    Assessment/ Plan: Kathlen Brunswick here for annual physical exam.   Annual physical exam  Anxiety - Plan: Drug Screen 10 W/Conf, Se, ALPRAZolam (XANAX) 0.5 MG tablet, AMB Referral to [facility] Coordinaton  Carer stress syndrome - Plan: AMB Referral to [facility] Coordinaton  Moderate episode of recurrent major depressive disorder ([place]) - Plan: AMB Referral to Community Care Coordinaton  Controlled substance agreement signed - Plan: Drug Screen 10 W/Conf, Se  Pure hypercholesterolemia - Plan: CMP14+EGFR, LDL Cholesterol, Direct, TSH  Screening, anemia, deficiency, iron - Plan: CBC  Pain in gums - Plan: lidocaine (XYLOCAINE) 2 % solution  Elevated blood pressure reading in office without diagnosis of hypertension  Reinforced need to perform self-care.  Encouraged to get back into the gym.  Alprazolam has been renewed.  Continue Celexa.  Controlled substance contract and drug screen obtained as per office policy.The Narcotic Database has been reviewed.  There were no red flags.    Plan to check direct LDL given nonfasting status, CMP and TSH.  Continue Pravachol.  This been renewed  Lidocaine solution given for  gargling and spitting to help with some of the oral pain she is experienced status post tooth extraction  Blood pressure noted to be elevated but I think this is likely a pain response given ongoing gum pain today  Counseled on healthy lifestyle choices, including diet (rich in fruits, vegetables and lean meats and low in salt and simple carbohydrates) and exercise (at least 30 minutes of moderate physical activity daily).  Patient to follow up in 1 year for annual exam or sooner if needed.  Joseluis Alessio M. Lajuana Ripple, DO

## 2021-04-12 ENCOUNTER — Telehealth: Payer: Self-pay

## 2021-04-12 NOTE — Chronic Care Management (AMB) (Signed)
  Chronic Care Management   Note  04/12/2021 Name: Morgan Santos MRN: [id number] DOB: 07-28-1951  Morgan Santos is a 70 y.o. year old female who is a primary care patient of Janora Norlander, DO. I reached out to Kathlen Brunswick by phone today in response to a referral sent by Ms. Abbey Chatters Perdue's PCP, Janora Norlander, DO      Ms. Cure was given information about Chronic Care Management services today including:  CCM service includes personalized support from designated clinical staff supervised by her physician, including individualized plan of care and coordination with other care providers 24/7 contact phone numbers for assistance for urgent and routine care needs. Service will only be billed when office clinical staff spend 20 minutes or more in a month to coordinate care. Only one practitioner may furnish and bill the service in a calendar month. The patient may stop CCM services at any time (effective at the end of the month) by phone call to the office staff. The patient will be responsible for cost sharing (co-pay) of up to 20% of the service fee (after annual deductible is met).  Patient agreed to services and verbal consent obtained.   Follow up plan: Telephone appointment with care management team member scheduled for:04/23/2021  Noreene Larsson, [address], [place], [place] [address] Direct Dial: [phone] [email or web address] Website: [email or web address]

## 2021-04-14 LAB — CMP14+EGFR
ALT: 38 IU/L — ABNORMAL HIGH (ref 0–32)
AST: 44 IU/L — ABNORMAL HIGH (ref 0–40)
Albumin/Globulin Ratio: 1.5 (ref 1.2–2.2)
Albumin: 4.1 g/dL (ref 3.8–4.8)
Alkaline Phosphatase: 111 IU/L (ref 44–121)
BUN/Creatinine Ratio: 15 (ref 12–28)
BUN: 11 mg/dL (ref 8–27)
Bilirubin Total: 0.5 mg/dL (ref 0.0–1.2)
CO2: 26 mmol/L (ref 20–29)
Calcium: 9.2 mg/dL (ref 8.7–10.3)
Chloride: 102 mmol/L (ref 96–106)
Creatinine, Ser: 0.73 mg/dL (ref 0.57–1.00)
Globulin, Total: 2.7 g/dL (ref 1.5–4.5)
Glucose: 93 mg/dL (ref 65–99)
Potassium: 3.9 mmol/L (ref 3.5–5.2)
Sodium: 139 mmol/L (ref 134–144)
Total Protein: 6.8 g/dL (ref 6.0–8.5)
eGFR: 88 mL/min/{1.73_m2} (ref >59)

## 2021-04-14 LAB — DRUG SCREEN 10 W/CONF, SERUM
Amphetamines, IA: NEGATIVE ng/mL
Barbiturates, IA: NEGATIVE ug/mL
Benzodiazepines, IA: NEGATIVE ng/mL
Cocaine & Metabolite, IA: NEGATIVE ng/mL
Methadone, IA: NEGATIVE ng/mL
Opiates, IA: NEGATIVE ng/mL
Oxycodones, IA: NEGATIVE ng/mL
Phencyclidine, IA: NEGATIVE ng/mL
Propoxyphene, IA: NEGATIVE ng/mL
THC(Marijuana) Metabolite, IA: NEGATIVE ng/mL

## 2021-04-14 LAB — CBC
Hematocrit: 41.6 % (ref 34.0–46.6)
Hemoglobin: 13.3 g/dL (ref 11.1–15.9)
MCH: 29 pg (ref 26.6–33.0)
MCHC: 32 g/dL (ref 31.5–35.7)
MCV: 91 fL (ref 79–97)
Platelets: 213 10*3/uL (ref 150–450)
RBC: 4.58 x10E6/uL (ref 3.77–5.28)
RDW: 12.7 % (ref 11.7–15.4)
WBC: 7.2 10*3/uL (ref 3.4–10.8)

## 2021-04-14 LAB — LDL CHOLESTEROL, DIRECT: LDL Direct: 101 mg/dL — ABNORMAL HIGH (ref 0–99)

## 2021-04-14 LAB — TSH: TSH: 2.08 u[IU]/mL (ref 0.450–4.500)

## 2021-04-23 ENCOUNTER — Ambulatory Visit (INDEPENDENT_AMBULATORY_CARE_PROVIDER_SITE_OTHER): Payer: Medicare HMO | Admitting: Licensed Clinical Social Worker

## 2021-04-23 DIAGNOSIS — F331 Major depressive disorder, recurrent, moderate: Secondary | ICD-10-CM | POA: Diagnosis not present

## 2021-04-23 DIAGNOSIS — E782 Mixed hyperlipidemia: Secondary | ICD-10-CM

## 2021-04-23 DIAGNOSIS — Z800 Family history of malignant neoplasm of digestive organs: Secondary | ICD-10-CM

## 2021-04-23 DIAGNOSIS — I10 Essential (primary) hypertension: Secondary | ICD-10-CM | POA: Diagnosis not present

## 2021-04-23 DIAGNOSIS — K219 Gastro-esophageal reflux disease without esophagitis: Secondary | ICD-10-CM

## 2021-04-23 NOTE — Chronic Care Management (AMB) (Signed)
Chronic Care Management    Clinical Social Work Note  04/23/2021 Name: Morgan Santos MRN: [id number] DOB: 1951/05/30  Morgan Santos is a 70 y.o. year old female who is a primary care patient of Morgan Norlander, DO. The CCM team was consulted to assist the patient with chronic disease management and/or care coordination needs related to: [employer] .   Engaged with patient by telephone for initial visit in response to provider referral for social work chronic care management and care coordination services.   Consent to Services:  The patient was given the following information about Chronic Care Management services today, agreed to services, and gave verbal consent: 1. CCM service includes personalized support from designated clinical staff supervised by the primary care provider, including individualized plan of care and coordination with other care providers 2. 24/7 contact phone numbers for assistance for urgent and routine care needs. 3. Service will only be billed when office clinical staff spend 20 minutes or more in a month to coordinate care. 4. Only one practitioner may furnish and bill the service in a calendar month. 5.The patient may stop CCM services at any time (effective at the end of the month) by phone call to the office staff. 6. The patient will be responsible for cost sharing (co-pay) of up to 20% of the service fee (after annual deductible is met). Patient agreed to services and consent obtained.  Patient agreed to services and consent obtained.   Assessment: Review of patient past medical history, allergies, medications, and health status, including review of relevant consultants reports was performed today as part of a comprehensive evaluation and provision of chronic care management and care coordination services.     SDOH (Social Determinants of Health) assessments and interventions performed: client is anxious about care needs of her spouse. She is anxious about  financial challenges SDOH Interventions    Flowsheet Row Most Recent Value  SDOH Interventions   Depression Interventions/Treatment  Medication        Advanced Directives Status: See Vyn[employer] application for related entries.  CCM Care Plan  Allergies  Allergen Reactions   Codeine Nausea And Vomiting   Crestor [Rosuvastatin Calcium]     Muscle pain   Lipitor [Atorvastatin]     Muscle pain   Namenda [Memantine Hcl]     Nightmares    Peanut-Containing Drug Products     Peanut butter triggers asthma    Multihance [Gadobenate] Nausea Only and Cough    PT HAD INCREASED MUCOUS PRODUCTION AND PHLEGMY COUGH LASTING 10 MINS AFTER INJECTION, PT SENT HOME WITH BENADRYL, PREV NAUSEA AFTER GAD    Outpatient Encounter Medications as of 04/23/2021  Medication Sig Note   albuterol (VENTOLIN HFA) 108 (90 Base) MCG/ACT inhaler Inhale 2 puffs into the lungs every 6 (six) hours as needed for wheezing or shortness of breath. (whichever is covered by ins)    ALPRAZolam (XANAX) 0.5 MG tablet Take 0.5-1 tablets (0.25-0.5 mg total) by mouth 2 (two) times daily as needed for anxiety.    ARTIFICIAL TEAR OP Place 1 drop into both eyes daily as needed (dry eyes).    aspirin EC 81 MG tablet Take 81 mg by mouth every other day. 10/02/2017: Pt supposed to take it every morning but its more like every other day   budesonide-formoterol (SYMBICORT) 160-4.5 MCG/ACT inhaler Inhale 1 puff into the lungs 2 (two) times daily. (Patient not taking: Reported on 04/09/2021)    Calcium-Magnesium-Zinc 500-250-12.5 MG TABS Take 1 tablet by  mouth daily.     Cholecalciferol (VITAMIN D-3) 5000 UNITS TABS Take 10,000 Units by mouth daily.     citalopram (CELEXA) 40 MG tablet Take 1 tablet (40 mg total) by mouth daily.    clindamycin (CLEOCIN) 150 MG capsule Take by mouth 3 (three) times daily.    Cyanocobalamin (B-12) 2500 MCG SUBL Place 2,500 mcg under the tongue daily.    lidocaine (XYLOCAINE) 2 % solution Swish and SPIT OUT 29m  every 6 hours as needed for gum pain    magnesium oxide (MAG-OX) 400 MG tablet Take 400 mg by mouth daily.    meloxicam (MOBIC) 7.5 MG tablet Take 1 tablet (7.5 mg total) by mouth daily as needed for pain.    pravastatin (PRAVACHOL) 80 MG tablet TAKE 1 TABLET EVERY DAY    No facility-administered encounter medications on file as of 04/23/2021.    Patient Active Problem List   Diagnosis Date Noted   UTI symptoms 04/13/2020   Dysuria 04/13/2020   Abnormal brain MRI 07/05/2018   Hx of adenomatous colonic polyps 09/21/2017   Primary insomnia 07/26/2017   Disorder of left rotator cuff 03/13/2017   Essential hypertension 03/13/2017   Bronchitis 09/21/2016   History of breast cancer 09/21/2016   Depression 05/13/2016   Memory loss 05/13/2016   Gastroesophageal reflux disease without esophagitis 05/13/2016   Hyperlipidemia 05/13/2016   Body mass index 34.0-34.9, adult 05/13/2016   Unspecified constipation 05/21/2014   FH: colon cancer 05/21/2014   [place]outer [place]    Conditions to be addressed/monitored:  monitor client completion of ADLs; monitor client management of anxiety issues  Care Plan : LCSW Care Plan  Updates made by FKatha Cabal LCSW since 04/23/2021 12:00 AM     Problem: Coping Skills (General Plan of Care)      Goal: Coping Skills Enhanced; Manage Daily Activities; Taking care of spouse's needs   Start Date: 04/23/2021  Expected End Date: 07/22/2021  This Visit's Progress: On track  Priority: Medium  Note:   Current barriers:   Patient in need of assistance with connecting to community resources for possible help for ADLs completion of client Memory issues Some challenges in meeting care needs of her husband  Clinical Goals:   Client will call LCSW in next 30 days to discuss client completion of daily  activites Client will call LCSW in next 30 days to discuss care needs of her spouse Client will  attend scheuled medical appointment sin next 30 days  Clinical Interventions:   Collaboration with GJanora Norlander DO regarding development and update of comprehensive plan of care as evidenced by provider attestation and co-signature Assessment of needs, barriers of client Talked with client about pain issues of client Talked with client about care needs of her spouse Talked with client about walking issues of client Talked with client about sleeping issues of client Talked with client about relaxation techniques (she likes listening to music via TV, radio, previously liked to go to gym to exercise) Talked with client about mood of client (she said she gets a little anxious or depressed occasionally. She is concerned over care needs of her spouse.  She has some financial concerns. She said she has her prescribed medications to use as needed Talked with client about meal provision of client Talked with client about transport needs of client Talked with client about CCM program support  Encouraged client to call RNCM or LCSW as needed for  CCM support Talked with Aarya about exercise activity of client. She said she used to go regularly to the gym to exercise Provided counseling support for client  Patient Coping Skills:  Completes ADLs as needed Tries to attend scheduled medical appointments Has some support from her spouse  Patient Deficits: Memory Issues Some walking challenges Decreased energy occasionally  Patient Goals: Over next 30 days, patient will: Attend scheduled medical appointments Completed ADLs as needed Call [facility] or LCSW as needed for CCM support  - Follow Up Plan: LCSW to call client on 06/07/21 at 10:00 AM     Norva Riffle.Tangie Stay MSW, LCSW Licensed Clinical Social Worker [facility] Care Management [phone]

## 2021-04-23 NOTE — Patient Instructions (Signed)
Visit Information  PATIENT GOALS:  Goals Addressed             This Visit's Progress    Manage My Emotions: Complete ADLs as needed; Address needs of her spouse       Timeframe:  Short-Term Goal Priority:  Medium Progress:  On Track Start Date:              04/23/21               Expected End Date:            07/23/21           Follow Up Date 06/07/21 at 10:00 AM    Manage Emotions; Complete ADLs as needed; Address care needs of her spouse   Why is this important?   When you are stressed, down or upset, your body reacts too.  For example, your blood pressure may get higher; you may have a headache or stomachache.  When your emotions get the best of you, your body's ability to fight off cold and flu gets weak.  These steps will help you manage your emotions.    Patient Coping Skills:  Completes ADLs as needed Tries to attend scheduled medical appointments Has some support from her spouse  Patient Deficits: Memory Issues Some walking challenges Decreased energy occasionally  Patient Goals: Over next 30 days, patient will: Attend scheduled medical appointments Completed ADLs as needed Call [facility] or LCSW as needed for CCM support  - Follow Up Plan: LCSW to call client on 06/07/21 at 10:00 AM     Norva Riffle.Nikoli Nasser MSW, LCSW Licensed Clinical Social Worker [facility] Care Management [phone]

## 2021-04-30 ENCOUNTER — Other Ambulatory Visit: Payer: Self-pay

## 2021-04-30 ENCOUNTER — Ambulatory Visit: Payer: Medicare HMO

## 2021-04-30 DIAGNOSIS — Z0130 Encounter for examination of blood pressure without abnormal findings: Secondary | ICD-10-CM

## 2021-04-30 NOTE — Progress Notes (Signed)
Patient here today for blood pressure check.  Blood pressure was 130/65, pulse 60.

## 2021-06-03 ENCOUNTER — Other Ambulatory Visit: Payer: Self-pay | Admitting: Family Medicine

## 2021-06-03 DIAGNOSIS — E785 Hyperlipidemia, unspecified: Secondary | ICD-10-CM

## 2021-06-04 ENCOUNTER — Other Ambulatory Visit: Payer: Self-pay | Admitting: Family Medicine

## 2021-06-07 ENCOUNTER — Telehealth: Payer: Medicare HMO

## 2021-06-24 ENCOUNTER — Other Ambulatory Visit: Payer: Self-pay | Admitting: Family Medicine

## 2021-07-02 ENCOUNTER — Telehealth: Payer: Medicare HMO

## 2021-07-05 ENCOUNTER — Telehealth: Payer: Self-pay | Admitting: Family Medicine

## 2021-07-05 ENCOUNTER — Ambulatory Visit (INDEPENDENT_AMBULATORY_CARE_PROVIDER_SITE_OTHER): Payer: Medicare HMO | Admitting: Licensed Clinical Social Worker

## 2021-07-05 DIAGNOSIS — R413 Other amnesia: Secondary | ICD-10-CM

## 2021-07-05 DIAGNOSIS — E782 Mixed hyperlipidemia: Secondary | ICD-10-CM

## 2021-07-05 DIAGNOSIS — I10 Essential (primary) hypertension: Secondary | ICD-10-CM

## 2021-07-05 DIAGNOSIS — Z800 Family history of malignant neoplasm of digestive organs: Secondary | ICD-10-CM

## 2021-07-05 DIAGNOSIS — F331 Major depressive disorder, recurrent, moderate: Secondary | ICD-10-CM

## 2021-07-05 DIAGNOSIS — K219 Gastro-esophageal reflux disease without esophagitis: Secondary | ICD-10-CM

## 2021-07-05 NOTE — Patient Instructions (Addendum)
Visit Information  Patient Goals:  Manage Emotions; Complete ADLs as needed. Address care needs of her spouse  Timeframe:  Short-Term Goal Priority:  Medium Progress:  On Track Start Date:              07/05/21               Expected End Date:            09/30/21           Follow Up Date  09/02/21 at 3:00 PM    Manage Emotions; Complete ADLs as needed; Address care needs of her spouse   Why is this important?   When you are stressed, down or upset, your body reacts too.  For example, your blood pressure may get higher; you may have a headache or stomachache.  When your emotions get the best of you, your body's ability to fight off cold and flu gets weak.  These steps will help you manage your emotions.    Patient Coping Skills:  Completes ADLs as needed Tries to attend scheduled medical appointments Has some support from her spouse  Patient Deficits: Memory Issues Some walking challenges Decreased energy occasionally  Patient Goals: Over next 30 days, patient will: Attend scheduled medical appointments Completed ADLs as needed Call [facility] or LCSW as needed for CCM support  Follow Up Plan: LCSW to call client on 09/02/21 at 3:00 PM to assess needs of client at that time.    Norva Riffle.Liliahna Cudd MSW, LCSW Licensed Clinical Social Worker [facility] Care Management [phone]

## 2021-07-05 NOTE — Chronic Care Management (AMB) (Signed)
Chronic Care Management    Clinical Social Work Note  07/05/2021 Name: Morgan Santos MRN: [id number] DOB: 1951-08-22  Morgan Santos is a 70 y.o. year old female who is a primary care patient of Morgan Norlander, DO. The CCM team was consulted to assist the patient with chronic disease management and/or care coordination needs related to: [employer] .   Engaged with patient by telephone for follow up visit in response to provider referral for social work chronic care management and care coordination services.   Consent to Services:  The patient was given information about Chronic Care Management services, agreed to services, and gave verbal consent prior to initiation of services.  Please see initial visit note for detailed documentation.   Patient agreed to services and consent obtained.   Assessment: Review of patient past medical history, allergies, medications, and health status, including review of relevant consultants reports was performed today as part of a comprehensive evaluation and provision of chronic care management and care coordination services.     SDOH (Social Determinants of Health) assessments and interventions performed:  SDOH Interventions    Flowsheet Row Most Recent Value  SDOH Interventions   Physical Activity Interventions Other (Comments)  [client has some walking challenges. she said she is less steady in walking than she used to be in walking]  Stress Interventions Provide Counseling  [client has stress related to managing her health needs. client has stress related to ongoing health needs of her spouse]  Depression Interventions/Treatment  Medication        Advanced Directives Status: See Vynca application for related entries.  CCM Care Plan  Allergies  Allergen Reactions   Codeine Nausea And Vomiting   Crestor [Rosuvastatin Calcium]     Muscle pain   Lipitor [Atorvastatin]     Muscle pain   Namenda [Memantine Hcl]     Nightmares     Peanut-Containing Drug Products     Peanut butter triggers asthma    Multihance [Gadobenate] Nausea Only and Cough    PT HAD INCREASED MUCOUS PRODUCTION AND PHLEGMY COUGH LASTING 10 MINS AFTER INJECTION, PT SENT HOME WITH BENADRYL, PREV NAUSEA AFTER GAD    Outpatient Encounter Medications as of 07/05/2021  Medication Sig Note   albuterol (VENTOLIN HFA) 108 (90 Base) MCG/ACT inhaler USE 2 PUFFS EVERY 6 HOURS AS NEEDED    ALPRAZolam (XANAX) 0.5 MG tablet Take 0.5-1 tablets (0.25-0.5 mg total) by mouth 2 (two) times daily as needed for anxiety.    ARTIFICIAL TEAR OP Place 1 drop into both eyes daily as needed (dry eyes).    aspirin EC 81 MG tablet Take 81 mg by mouth every other day. 10/02/2017: Pt supposed to take it every morning but its more like every other day   budesonide-formoterol (SYMBICORT) 160-4.5 MCG/ACT inhaler Inhale 1 puff into the lungs 2 (two) times daily. (Patient not taking: Reported on 04/09/2021)    Calcium-Magnesium-Zinc 500-250-12.5 MG TABS Take 1 tablet by mouth daily.     Cholecalciferol (VITAMIN D-3) 5000 UNITS TABS Take 10,000 Units by mouth daily.     citalopram (CELEXA) 40 MG tablet Take 1 tablet (40 mg total) by mouth daily.    clindamycin (CLEOCIN) 150 MG capsule Take by mouth 3 (three) times daily.    Cyanocobalamin (B-12) 2500 MCG SUBL Place 2,500 mcg under the tongue daily.    lidocaine (XYLOCAINE) 2 % solution Swish and SPIT OUT 41mL every 6 hours as needed for gum pain    magnesium  oxide (MAG-OX) 400 MG tablet Take 400 mg by mouth daily.    meloxicam (MOBIC) 7.5 MG tablet TAKE 1 TABLET EVERY DAY AS NEEDED FOR PAIN    pravastatin (PRAVACHOL) 80 MG tablet TAKE 1 TABLET EVERY DAY    No facility-administered encounter medications on file as of 07/05/2021.    Patient Active Problem List   Diagnosis Date Noted   UTI symptoms 04/13/2020   Dysuria 04/13/2020   Abnormal brain MRI 07/05/2018   Hx of adenomatous colonic polyps 09/21/2017   Primary insomnia  07/26/2017   Disorder of left rotator cuff 03/13/2017   Essential hypertension 03/13/2017   Bronchitis 09/21/2016   History of breast cancer 09/21/2016   Depression 05/13/2016   Memory loss 05/13/2016   Gastroesophageal reflux disease without esophagitis 05/13/2016   Hyperlipidemia 05/13/2016   Body mass index 34.0-34.9, adult 05/13/2016   Unspecified constipation 05/21/2014   FH: colon cancer 05/21/2014   Breast cancer of upper-outer quadrant of right female breast ([place]) 06/19/2013    Conditions to be addressed/monitored: monitor mobility of client; monitor ADLs completion of client. Monitor client management of depression issues  Care Plan : LCSW Care Plan  Updates made by Morgan Cabal, LCSW since 07/05/2021 12:00 AM     Problem: Coping Skills (General Plan of Care)      Goal: Coping Skills Enhanced; Manage Daily Activities; Taking care of spouse's needs   Start Date: 07/05/2021  Expected End Date: 09/30/2021  This Visit's Progress: On track  Recent Progress: On track  Priority: Medium  Note:   Current barriers:   Patient in need of assistance with connecting to community resources for possible help for ADLs completion of client Memory issues Some challenges in meeting care needs of her husband  Clinical Goals:   Client will call LCSW in next 30 days to discuss client completion of daily  activites Client will call LCSW in next 30 days to discuss care needs of her spouse Client will attend scheuled medical appointments in next 30 days  Clinical Interventions:   Collaboration with Morgan Norlander, DO regarding development and update of comprehensive plan of care as evidenced by provider attestation and co-signature Discussed pain issues of client and discussed appetite of client Discussed with client the care needs of her spouse.  Reviewed walking issues of client. She said she is walking without a device; but, she said she is not as stable as she used to be in  walking so she is wondering if she needs to use a walker or a cane again. Reviewed sleeping issues of client. She said she does not always sleep very well. She has diagnosis of insomnia. Provided counseling support for client. Discussed relaxation techniques of client (she likes listening to music via TV, radio, previously liked to go to gym to exercise) Reviewed mood status with client. She said she does get sad sometimes.  She has ringing in her ears and said this has ben occurring since 2015.Marland Kitchen She said she may talk louder than needed. She said ringing in her ears is irritating to her. LCSW collaborated today with Triage Nurse at [facility] to see if she would call client to discuss client symptom of ringing in her ears.  Discussed health of client's spouse. She said her spouse has Parkinson's disease. She said is can do ADLs; but she said he is walking slowly.  Reviewed transport needs with client. She drives to needed appointments and to complete errands Encouraged client to call RNCM or LCSW  as needed for CCM support Discussed with Kayleann about exercise activity of client. She said she used to go regularly to the gym to exercise. She is considering exercising again at gymn. LCSW encouraged client to consider exercising again at gym for mobility help and to relieve stress issues  Patient Coping Skills:  Completes ADLs as needed Tries to attend scheduled medical appointments Has some support from her spouse  Patient Deficits: Memory Issues Some walking challenges Decreased energy occasionally  Patient Goals: Over next 30 days, patient will: Attend scheduled medical appointments Completed ADLs as needed Call [facility] or LCSW as needed for CCM support - Follow Up Plan: LCSW to call client on 09/02/21 at 3:00 PM to assess client needs.       Norva Riffle.Peggy Monk MSW, LCSW Licensed Clinical Social Worker [facility] Care Management [phone]

## 2021-07-05 NOTE — Telephone Encounter (Signed)
Appointment scheduled for 10/22 with Dr. Lajuana Ripple for back itching and ringing in her ears.

## 2021-07-13 ENCOUNTER — Other Ambulatory Visit: Payer: Self-pay

## 2021-07-13 ENCOUNTER — Encounter: Payer: Self-pay | Admitting: Family Medicine

## 2021-07-13 ENCOUNTER — Ambulatory Visit (INDEPENDENT_AMBULATORY_CARE_PROVIDER_SITE_OTHER): Payer: Medicare HMO | Admitting: Family Medicine

## 2021-07-13 VITALS — BP 135/66 | HR 61 | Temp 97.8°F | Ht 70.5 in | Wt 259.2 lb

## 2021-07-13 DIAGNOSIS — L299 Pruritus, unspecified: Secondary | ICD-10-CM | POA: Diagnosis not present

## 2021-07-13 DIAGNOSIS — Z6379 Other stressful life events affecting family and household: Secondary | ICD-10-CM

## 2021-07-13 DIAGNOSIS — I10 Essential (primary) hypertension: Secondary | ICD-10-CM

## 2021-07-13 DIAGNOSIS — H9313 Tinnitus, bilateral: Secondary | ICD-10-CM | POA: Diagnosis not present

## 2021-07-13 MED ORDER — LEVOCETIRIZINE DIHYDROCHLORIDE 5 MG PO TABS: 5.0000 mg | ORAL_TABLET | Freq: Every day | ORAL | 0 refills | Status: DC | PRN

## 2021-07-13 NOTE — Patient Instructions (Addendum)
Let me know if your symptoms get better and if you still need a referral to the ear nose and throat doctor  Parkinson's Disease Parkinson's disease is a movement disorder. It is a long-term condition that gets worse over time. Each person with Parkinson's disease is affected differently. This condition limits a person's ability to control movements and move the body normally. The condition can range from mild to severe. Parkinson's disease tends to get worse slowly over several years. What are the causes? Parkinson's disease is caused by a loss of brain cells (neurons) that make a brain chemical called dopamine. Dopamine is needed to control movement. As the condition gets worse, more neurons that make dopamine die. This makes it hard to move or control your movements. The exact cause of the loss of neurons is not known. Genes and the environment may contribute to the cause of Parkinson's disease. What increases the risk? The following factors may make you more likely to develop this condition: Being female. Being age 53 or older. Having a family history of Parkinson's disease. Having had a traumatic brain injury. Having been exposed to toxins, such as pesticides. Having depression. What are the signs or symptoms? Symptoms of this condition can vary. The main symptoms are related to movement. These include: A tremor or shaking while you are resting. You cannot control the shaking. Stiffness in your arms and legs (rigidity). Slowing of movement. You may lose facial expressions and have trouble making small movements that are needed to button clothing or brush your teeth. An abnormal walk. You may walk with short, shuffling steps. Loss of balance and stability when standing. You may sway, fall backward, and have trouble making turns. Other symptoms include: Mental or cognitive changes, including: Depression or anxiety. Having false beliefs (delusions). Seeing, hearing, or feeling things that  do not exist (hallucinations). Trouble speaking or swallowing. Changes in bowel or bladder functions, including constipation, having to go urgently or frequently, or not being able to control your bowel or bladder. Changes in sleep habits, acting out dreams, or trouble sleeping. Depending on the severity of the symptoms, Parkinson's disease may be mild, moderate, or advanced. Parkinson's disease progression is different for everyone. Some people may not progress to the advanced stage. Mild Parkinson's disease involves: Movement problems that do not affect daily activities. Movement problems on one side of the body. Moderate Parkinson's disease involves: Movement problems on both sides of the body. Slowing of movement. Coordination and balance problems. Advanced Parkinson's disease involves: Extreme difficulty walking. Inability to live alone safely. Signs of dementia, such as having trouble remembering things, doing daily tasks such as getting dressed, and problem solving. How is this diagnosed? This condition is diagnosed by a specialist. A diagnosis may be made based on symptoms, your medical history, and a physical exam. You may also have brain imaging tests to check for loss of neurons in the brain. How is this treated? There is no cure for Parkinson's disease. Treatment focuses on managing your symptoms. Treatment may include: Medicines. Everyone responds to medicines differently. Your response may change over time. Work with your health care provider to find the best medicines for you. Speech, occupational, and physical therapy. Deep brain stimulation surgery to reduce tremors and other involuntary movements. Follow these instructions at home: Medicines Take over-the-counter and prescription medicines only as told by your health care provider. Avoid taking medicines that can affect thinking, such as pain or sleeping medicines. Eating and drinking Follow instructions from your health  care provider about eating or drinking restrictions. Do not drink alcohol. Activity Ask your health care provider if it is safe for you to drive. Do exercises as told by your health care provider or physical therapist. Lifestyle  Install grab bars and railings in your home to prevent falls. Do not use any products that contain nicotine or tobacco. These products include cigarettes, chewing tobacco, and vaping devices, such as e-cigarettes. If you need help quitting, ask your health care provider. Consider joining a support group for people with Parkinson's disease. General instructions Work with your health care provider to know the kind of day-to-day help that you may need and what to do to stay safe. Keep all follow-up visits. This is important. Follow-up visits include any visits with a physical therapist, speech therapist, or occupational therapist. Where to find more information [employer] of Neurological Disorders and Stroke: [employer] [place]: www.parkinson.org Contact a health care provider if: Medicines do not help your symptoms. You are unsteady or have fallen at home. You need more support to function well at home. You have trouble swallowing. You have severe constipation. You are having problems with side effects from your medicines. You feel confused, anxious, depressed, or have hallucinations. Get help right away if you: Are injured after a fall. Cannot swallow without choking. Have chest pain or trouble breathing. Do not feel safe at home. Have thoughts about hurting yourself or others. These symptoms may represent a serious problem that is an emergency. Do not wait to see if the symptoms will go away. Get medical help right away. Call your local emergency services (911 in the [place] Do not drive yourself to the hospital. If you ever feel like you may hurt yourself or others, or have thoughts about taking your own life, get help right away.  Go to your nearest emergency department or: Call your local emergency services (911 in the [place] Call a suicide crisis helpline, such as the [place] at (508)740-9466 or 988 in the [place]. This is open 24 hours a day in the [place] Text the Crisis Text Line at 928-029-0860 (in the [place].). Summary Parkinson's disease is a long-term condition that gets worse over time. This condition limits your ability to control your movements and move your body normally. There is no cure for Parkinson's disease. Treatment focuses on managing your symptoms. Work with your health care provider to know the kind of day-to-day help that you may need and what to do to stay safe. Keep all follow-up visits, including any visits with a physical therapist, speech therapist, or occupational therapist. This is important. This information is not intended to replace advice given to you by your health care provider. Make sure you discuss any questions you have with your health care provider. Document Revised: 03/03/2021 Document Reviewed: 11/23/2020 Elsevier Patient Education  [place].

## 2021-07-13 NOTE — Progress Notes (Signed)
Subjective: Morgan Santos/ tinnitis PCP: Janora Norlander, DO TWS:FKCLE Morgan Santos is a 70 y.o. female presenting to clinic today for:  1. Pruritis She reports couple weeks history of pruritus along her back and upper extremities.  She notes that she has been using Benadryl intermittently which does provide relief of this.  She does not appreciate a rash per se.  Apparently she did switch her body wash recently to skin so soft.  She had not tried discontinuing that to see if this would resolve.  2. Tinnitus She reports bilateral ringing in her ears.  She is had hearing testing done in the past and was told that this may be early signs of hearing loss but it was not bad enough to get hearing aids.  She has been drinking some apple cider vinegar in her water in efforts to improve this.  Denies any barotrauma, drainage from ears.  She does admit to some nasal itching and sinus symptoms.  She is not currently on any antihistamines except for Benadryl intermittently as above   ROS: Per HPI  Allergies  Allergen Reactions   Codeine Nausea And Vomiting   Crestor [Rosuvastatin Calcium]     Muscle pain   Lipitor [Atorvastatin]     Muscle pain   Namenda [Memantine Hcl]     Nightmares    Peanut-Containing Drug Products     Peanut butter triggers asthma    Multihance [Gadobenate] Nausea Only and Cough    PT HAD INCREASED MUCOUS PRODUCTION AND PHLEGMY COUGH LASTING 10 MINS AFTER INJECTION, PT SENT HOME WITH BENADRYL, PREV NAUSEA AFTER GAD   Past Medical History:  Diagnosis Date   Anemia    when she was younger   Anxiety    Arthritis    hip- R, back; osteoarthritis   Asthma    Breast cancer ([place])    right   Complication of anesthesia    Constipation    Depression    pt. remarks that she "takes respridal because if I don't take it I get angry"    GERD (gastroesophageal reflux disease)    pt. uses vinegar for heartburn, ususally once per week    H/O degenerative disc disease     Hyperlipidemia    Hypertension    "when I get upset"  Not on medication   Memory loss    PONV (postoperative nausea and vomiting)    "just for one day"   Stroke [facility]) ?2010 or 2011   2010, [facility] for stroke "mini stroke" short term memory problems since    Current Outpatient Medications:    albuterol (VENTOLIN HFA) 108 (90 Base) MCG/ACT inhaler, USE 2 PUFFS EVERY 6 HOURS AS NEEDED, Disp: 8.5 g, Rfl: 1   ALPRAZolam (XANAX) 0.5 MG tablet, Take 0.5-1 tablets (0.25-0.5 mg total) by mouth 2 (two) times daily as needed for anxiety., Disp: 60 tablet, Rfl: 1   ARTIFICIAL TEAR OP, Place 1 drop into both eyes daily as needed (dry eyes)., Disp: , Rfl:    aspirin EC 81 MG tablet, Take 81 mg by mouth every other day., Disp: , Rfl:    budesonide-formoterol (SYMBICORT) 160-4.5 MCG/ACT inhaler, Inhale 1 puff into the lungs 2 (two) times daily. (Patient not taking: Reported on 04/09/2021), Disp: 1 Inhaler, Rfl: 3   Calcium-Magnesium-Zinc 500-250-12.5 MG TABS, Take 1 tablet by mouth daily. , Disp: , Rfl:    Cholecalciferol (VITAMIN D-3) 5000 UNITS TABS, Take 10,000 Units by mouth daily. , Disp: , Rfl:  citalopram (CELEXA) 40 MG tablet, Take 1 tablet (40 mg total) by mouth daily., Disp: 90 tablet, Rfl: 3   clindamycin (CLEOCIN) 150 MG capsule, Take by mouth 3 (three) times daily., Disp: , Rfl:    Cyanocobalamin (B-12) 2500 MCG SUBL, Place 2,500 mcg under the tongue daily., Disp: , Rfl:    lidocaine (XYLOCAINE) 2 % solution, Swish and SPIT OUT 69mL every 6 hours as needed for gum pain, Disp: 200 mL, Rfl: 0   magnesium oxide (MAG-OX) 400 MG tablet, Take 400 mg by mouth daily., Disp: , Rfl:    meloxicam (MOBIC) 7.5 MG tablet, TAKE 1 TABLET EVERY DAY AS NEEDED FOR PAIN, Disp: 90 tablet, Rfl: 2   pravastatin (PRAVACHOL) 80 MG tablet, TAKE 1 TABLET EVERY DAY, Disp: 90 tablet, Rfl: 1 Social History   Socioeconomic History   Marital status: Married    Spouse name: [name]   Number of children: 4    Years of education: 12th   Highest education level: High school graduate  Occupational History   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   Right-handed.   1 cup coffee per day, occasional soda.   Social Determinants of Health   Financial Resource Strain: Not on file  Food Insecurity: Not on file  Transportation Needs: Not on file  Physical Activity: Inactive   Days of Exercise per Week: 0 days   Minutes of Exercise per Session: 0 min  Stress: Stress Concern Present   Feeling of Stress : Rather much  Social Connections: Not on file  Intimate Partner Violence: Not on file   Family History  Problem Relation Age of Onset   Hypertension Mother    Diabetes type II Mother    CVA Mother    Heart disease Mother    Osteoarthritis Mother    Depression Mother    Other Mother        degenerative disc disease   Alcoholism Father    Pneumonia Father    Colon cancer Maternal Aunt        age 24s   Colon cancer Other        maternal great aunt   Diabetes Sister    Hyperlipidemia Sister    Hypertension Sister    Cancer Brother    Diabetes Sister    Hypertension Sister    Diabetes Sister    Hypertension Sister    Diabetes Sister    Kidney disease Sister    Colon polyps Neg Hx     Objective: Office vital signs reviewed. BP 135/66   Pulse 61   Temp 97.8 F (36.6 C) (Temporal)   Ht 5' 10.5" (1.791 m)   Wt 259 lb 3.2 oz (117.6 kg)   SpO2 98%   BMI 36.67 kg/m    Physical Examination:  General: Awake, alert, well nourished, No acute distress HEENT: Normal; tympanic membranes with normal light reflex.  No bulging.  No appreciable deformities. Cardio: regular rate and rhythm, S1S2 heard, no murmurs appreciated Pulm: clear to auscultation bilaterally, no wheezes, rhonchi or rales; normal work of breathing on room air Extremities: warm, well perfused, No edema, cyanosis or clubbing; +2 pulses bilaterally Skin: No appreciable rash  Assessment/ Plan: 70 y.o. female   Tinnitus of both ears - Plan: levocetirizine (XYZAL) 5 MG tablet  Pruritic dermatitis - Plan: levocetirizine (XYZAL) 5 MG tablet  Essential hypertension  Stress due to illness of family member  I am going to trial her on Xyzal for her dermatitis.  No overt rash was appreciated today.  Okay to use Benadryl if needed on top of this medication.  Caution sedation.  Tinnitus might be related to some early hearing loss but also may be related to sinus symptoms.  If no improvement in her symptoms with the Xyzal, we can consider referral for formal repeat hearing testing and evaluation  Blood pressure well controlled.  No changes.  She did admit to some stress due to the illness of her husband, who suffers from Parkinson disease.  She requested more information on Parkinson's disease today.   No orders of the defined types were placed in this encounter.  No orders of the defined types were placed in this encounter.    Janora Norlander, DO [place] [phone]

## 2021-07-21 DIAGNOSIS — F331 Major depressive disorder, recurrent, moderate: Secondary | ICD-10-CM

## 2021-07-21 DIAGNOSIS — Z87891 Personal history of nicotine dependence: Secondary | ICD-10-CM

## 2021-07-21 DIAGNOSIS — I10 Essential (primary) hypertension: Secondary | ICD-10-CM

## 2021-07-21 DIAGNOSIS — E782 Mixed hyperlipidemia: Secondary | ICD-10-CM

## 2021-08-09 ENCOUNTER — Telehealth: Payer: Medicare HMO

## 2021-08-11 ENCOUNTER — Ambulatory Visit (INDEPENDENT_AMBULATORY_CARE_PROVIDER_SITE_OTHER): Payer: Medicare HMO | Admitting: Licensed Clinical Social Worker

## 2021-08-11 DIAGNOSIS — I10 Essential (primary) hypertension: Secondary | ICD-10-CM

## 2021-08-11 DIAGNOSIS — E782 Mixed hyperlipidemia: Secondary | ICD-10-CM

## 2021-08-11 DIAGNOSIS — F331 Major depressive disorder, recurrent, moderate: Secondary | ICD-10-CM

## 2021-08-11 DIAGNOSIS — R413 Other amnesia: Secondary | ICD-10-CM

## 2021-08-11 DIAGNOSIS — Z800 Family history of malignant neoplasm of digestive organs: Secondary | ICD-10-CM

## 2021-08-11 DIAGNOSIS — K219 Gastro-esophageal reflux disease without esophagitis: Secondary | ICD-10-CM

## 2021-08-11 NOTE — Patient Instructions (Addendum)
Visit Information  Patient Goals:  Manage Emotions. Complete ADLs as needed. Address care needs of her spouse.   Timeframe:  Short-Term Goal Priority:  Medium Progress:  On Track Start Date:             08/11/21               Expected End Date:           11/08/21           Follow Up Date  09/02/21 at 3:00 PM    Manage Emotions; Complete ADLs as needed; Address care needs of her spouse   Why is this important?   When you are stressed, down or upset, your body reacts too.  For example, your blood pressure may get higher; you may have a headache or stomachache.  When your emotions get the best of you, your body's ability to fight off cold and flu gets weak.  These steps will help you manage your emotions.    Patient Coping Skills:  Completes ADLs as needed Tries to attend scheduled medical appointments Has some support from her spouse  Patient Deficits: Memory Issues Some walking challenges Decreased energy occasionally  Patient Goals: Over next 30 days, patient will: Attend scheduled medical appointments Completed ADLs as needed Call [facility] or LCSW as needed for CCM support - Follow Up Plan: LCSW to call client on 09/02/21 at 3:00 PM to assess needs of client at that time.    Norva Riffle.Alphonse Asbridge MSW, LCSW Licensed Clinical Social Worker [facility] Care Management [phone]

## 2021-08-11 NOTE — Chronic Care Management (AMB) (Signed)
Chronic Care Management    Clinical Social Work Note  08/11/2021 Name: Morgan Santos MRN: [id number] DOB: 09-05-1950  Morgan Santos is a 70 y.o. year old female who is a primary care patient of Janora Norlander, DO. The CCM team was consulted to assist the patient with chronic disease management and/or care coordination needs related to: [employer] .   Engaged with patient by telephone for follow up visit in response to provider referral for social work chronic care management and care coordination services.   Consent to Services:  The patient was given information about Chronic Care Management services, agreed to services, and gave verbal consent prior to initiation of services.  Please see initial visit note for detailed documentation.   Patient agreed to services and consent obtained.   Assessment: Review of patient past medical history, allergies, medications, and health status, including review of relevant consultants reports was performed today as part of a comprehensive evaluation and provision of chronic care management and care coordination services.     SDOH (Social Determinants of Health) assessments and interventions performed:  SDOH Interventions    Flowsheet Row Most Recent Value  SDOH Interventions   Stress Interventions Provide Counseling  [has stress related to managing the health needs of her spouse]  Depression Interventions/Treatment  Medication        Advanced Directives Status: See Vynca application for related entries.  CCM Care Plan  Allergies  Allergen Reactions   Codeine Nausea And Vomiting   Crestor [Rosuvastatin Calcium]     Muscle pain   Lipitor [Atorvastatin]     Muscle pain   Namenda [Memantine Hcl]     Nightmares    Peanut-Containing Drug Products     Peanut butter triggers asthma    Multihance [Gadobenate] Nausea Only and Cough    PT HAD INCREASED MUCOUS PRODUCTION AND PHLEGMY COUGH LASTING 10 MINS AFTER INJECTION, PT SENT HOME  WITH BENADRYL, PREV NAUSEA AFTER GAD    Outpatient Encounter Medications as of 08/11/2021  Medication Sig Note   albuterol (VENTOLIN HFA) 108 (90 Base) MCG/ACT inhaler USE 2 PUFFS EVERY 6 HOURS AS NEEDED    ALPRAZolam (XANAX) 0.5 MG tablet Take 0.5-1 tablets (0.25-0.5 mg total) by mouth 2 (two) times daily as needed for anxiety.    ARTIFICIAL TEAR OP Place 1 drop into both eyes daily as needed (dry eyes).    aspirin EC 81 MG tablet Take 81 mg by mouth every other day. 10/02/2017: Pt supposed to take it every morning but its more like every other day   budesonide-formoterol (SYMBICORT) 160-4.5 MCG/ACT inhaler Inhale 1 puff into the lungs 2 (two) times daily.    Calcium-Magnesium-Zinc 500-250-12.5 MG TABS Take 1 tablet by mouth daily.     Cholecalciferol (VITAMIN D-3) 5000 UNITS TABS Take 10,000 Units by mouth daily.     citalopram (CELEXA) 40 MG tablet Take 1 tablet (40 mg total) by mouth daily.    clindamycin (CLEOCIN) 150 MG capsule Take by mouth 3 (three) times daily.    Cyanocobalamin (B-12) 2500 MCG SUBL Place 2,500 mcg under the tongue daily.    levocetirizine (XYZAL) 5 MG tablet Take 1 tablet (5 mg total) by mouth daily as needed for allergies. For itching    magnesium oxide (MAG-OX) 400 MG tablet Take 400 mg by mouth daily.    meloxicam (MOBIC) 7.5 MG tablet TAKE 1 TABLET EVERY DAY AS NEEDED FOR PAIN    pravastatin (PRAVACHOL) 80 MG tablet TAKE 1 TABLET EVERY  DAY    No facility-administered encounter medications on file as of 08/11/2021.    Patient Active Problem List   Diagnosis Date Noted   UTI symptoms 04/13/2020   Dysuria 04/13/2020   Abnormal brain MRI 07/05/2018   Hx of adenomatous colonic polyps 09/21/2017   Primary insomnia 07/26/2017   Disorder of left rotator cuff 03/13/2017   Essential hypertension 03/13/2017   Bronchitis 09/21/2016   History of breast cancer 09/21/2016   Depression 05/13/2016   Memory loss 05/13/2016   Gastroesophageal reflux disease without  esophagitis 05/13/2016   Hyperlipidemia 05/13/2016   Body mass index 34.0-34.9, adult 05/13/2016   Unspecified constipation 05/21/2014   FH: colon cancer 05/21/2014   Breast cancer of upper-outer quadrant of right female breast ([place]) 06/19/2013    Conditions to be addressed/monitored: monitor client completion of ADLs  Care Plan : [place]  Updates made by Katha Cabal, LCSW since 08/11/2021 12:00 AM     Problem: Coping Skills (General Plan of Care)      Goal: Coping Skills Enhanced; Manage Daily Activities; Taking care of spouse's needs   Start Date: 08/11/2021  Expected End Date: 11/08/2021  This Visit's Progress: On track  Recent Progress: On track  Priority: Medium  Note:   Current barriers:   Patient in need of assistance with connecting to community resources for possible help for ADLs completion of client Memory issues Some challenges in meeting care needs of her husband  Clinical Goals:   Client will call LCSW in next 30 days to discuss client completion of daily  activites Client will call LCSW in next 30 days to discuss care needs of her spouse Client will attend scheuled medical appointments in next 30 days  Clinical Interventions:  Collaboration with Janora Norlander, DO regarding development and update of comprehensive plan of care as evidenced by provider attestation and co-signature Discussed pain issues of client . She said she has some knee pain issues.  Discussed with client the care needs of her spouse. She said that her spouse has Parkinson's Disease. She said it is sometimes challenging to care for the daily needs of her spouse.  Reviewed sleeping issues of client. Discussed relaxation techniques of client (she likes listening to music via TV, radio, previously liked to go to gym to exercise) Reviewed mood status with client. She said she does get sad sometimes.She is concerned over the care needs of her spouse.  Reviewed transport needs  with client. She drives to needed appointments and to complete errands Encouraged client to call RNCM as needed for CCM nursing support Discussed socialization of client. She said she likes talking with her son in [place]. She also likes talking to some of her neighbors.  Patient Coping Skills:  Completes ADLs as needed Tries to attend scheduled medical appointments Has some support from her spouse  Patient Deficits: Memory Issues Some walking challenges Decreased energy occasionally  Patient Goals: Over next 30 days, patient will: Attend scheduled medical appointments Completed ADLs as needed Call [facility] or LCSW as needed for CCM support - Follow Up Plan: LCSW to call client on 09/02/21 at 3:00 PM to assess client needs.      Norva Riffle.Aoki Wedemeyer MSW, LCSW Licensed Clinical Social Worker [facility] Care Management [phone]

## 2021-08-21 DIAGNOSIS — I10 Essential (primary) hypertension: Secondary | ICD-10-CM

## 2021-08-21 DIAGNOSIS — E782 Mixed hyperlipidemia: Secondary | ICD-10-CM

## 2021-08-21 DIAGNOSIS — F331 Major depressive disorder, recurrent, moderate: Secondary | ICD-10-CM

## 2021-09-02 ENCOUNTER — Telehealth: Payer: Medicare HMO

## 2021-09-29 ENCOUNTER — Ambulatory Visit (INDEPENDENT_AMBULATORY_CARE_PROVIDER_SITE_OTHER): Payer: Medicare HMO

## 2021-09-29 DIAGNOSIS — F331 Major depressive disorder, recurrent, moderate: Secondary | ICD-10-CM

## 2021-09-29 DIAGNOSIS — I10 Essential (primary) hypertension: Secondary | ICD-10-CM

## 2021-09-29 DIAGNOSIS — R413 Other amnesia: Secondary | ICD-10-CM

## 2021-09-29 DIAGNOSIS — E782 Mixed hyperlipidemia: Secondary | ICD-10-CM

## 2021-09-29 DIAGNOSIS — Z800 Family history of malignant neoplasm of digestive organs: Secondary | ICD-10-CM

## 2021-09-29 DIAGNOSIS — K219 Gastro-esophageal reflux disease without esophagitis: Secondary | ICD-10-CM

## 2021-09-29 NOTE — Patient Instructions (Addendum)
Visit Information  Patient Goals:  Manage Emotions. Complete ADLs as needed. Address care needs of her spouse  Timeframe:  Short-Term Goal Priority:  Medium Progress:  On Track Start Date:           09/29/21               Expected End Date:         12/27/21           Follow Up Date  11/23/21 at 2:00 PM    Manage Emotions; Complete ADLs as needed; Address care needs of her spouse   Why is this important?   When you are stressed, down or upset, your body reacts too.  For example, your blood pressure may get higher; you may have a headache or stomachache.  When your emotions get the best of you, your body's ability to fight off cold and flu gets weak.  These steps will help you manage your emotions.    Patient Coping Skills:  Completes ADLs as needed Tries to attend scheduled medical appointments Has some support from her spouse  Patient Deficits: Memory Issues Some walking challenges Decreased energy occasionally  Patient Goals: Over next 30 days, patient will: Attend scheduled medical appointments Completed ADLs as needed Call [facility] or LCSW as needed for CCM support  - Follow Up Plan: LCSW to call client on 11/23/21 at 2:00 PM to assess needs of client at that time.    Norva Riffle.Glory Graefe MSW, [place] representative [facility] Care Management [phone]

## 2021-09-29 NOTE — Chronic Care Management (AMB) (Signed)
Chronic Care Management    Clinical Social Work Note  09/29/2021 Name: Morgan Santos MRN: [id number] DOB: 1951-07-21  Morgan Santos is a 71 y.o. year old female who is a primary care patient of Janora Norlander, DO. The CCM team was consulted to assist the patient with chronic disease management and/or care coordination needs related to: [employer] .   Engaged with patient by telephone for follow up visit in response to provider referral for social work chronic care management and care coordination services.   Consent to Services:  The patient was given information about Chronic Care Management services, agreed to services, and gave verbal consent prior to initiation of services.  Please see initial visit note for detailed documentation.   Patient agreed to services and consent obtained.   Assessment: Review of patient past medical history, allergies, medications, and health status, including review of relevant consultants reports was performed today as part of a comprehensive evaluation and provision of chronic care management and care coordination services.     SDOH (Social Determinants of Health) assessments and interventions performed:  SDOH Interventions    Flowsheet Row Most Recent Value  SDOH Interventions   Physical Activity Interventions Other (Comments)  [walking challenges. she gets short of breath on exertion]  Stress Interventions Provide Counseling  [client has stress related to managing her medical needs. client has stress related to care needs of her spouse]  Depression Interventions/Treatment  Medication        Advanced Directives Status: See Vynca application for related entries.  CCM Care Plan  Allergies  Allergen Reactions   Codeine Nausea And Vomiting   Crestor [Rosuvastatin Calcium]     Muscle pain   Lipitor [Atorvastatin]     Muscle pain   Namenda [Memantine Hcl]     Nightmares    Peanut-Containing Drug Products     Peanut butter triggers  asthma    Multihance [Gadobenate] Nausea Only and Cough    PT HAD INCREASED MUCOUS PRODUCTION AND PHLEGMY COUGH LASTING 10 MINS AFTER INJECTION, PT SENT HOME WITH BENADRYL, PREV NAUSEA AFTER GAD    Outpatient Encounter Medications as of 09/29/2021  Medication Sig Note   albuterol (VENTOLIN HFA) 108 (90 Base) MCG/ACT inhaler USE 2 PUFFS EVERY 6 HOURS AS NEEDED    ALPRAZolam (XANAX) 0.5 MG tablet Take 0.5-1 tablets (0.25-0.5 mg total) by mouth 2 (two) times daily as needed for anxiety.    ARTIFICIAL TEAR OP Place 1 drop into both eyes daily as needed (dry eyes).    aspirin EC 81 MG tablet Take 81 mg by mouth every other day. 10/02/2017: Pt supposed to take it every morning but its more like every other day   budesonide-formoterol (SYMBICORT) 160-4.5 MCG/ACT inhaler Inhale 1 puff into the lungs 2 (two) times daily.    Calcium-Magnesium-Zinc 500-250-12.5 MG TABS Take 1 tablet by mouth daily.     Cholecalciferol (VITAMIN D-3) 5000 UNITS TABS Take 10,000 Units by mouth daily.     citalopram (CELEXA) 40 MG tablet Take 1 tablet (40 mg total) by mouth daily.    clindamycin (CLEOCIN) 150 MG capsule Take by mouth 3 (three) times daily.    Cyanocobalamin (B-12) 2500 MCG SUBL Place 2,500 mcg under the tongue daily.    levocetirizine (XYZAL) 5 MG tablet Take 1 tablet (5 mg total) by mouth daily as needed for allergies. For itching    magnesium oxide (MAG-OX) 400 MG tablet Take 400 mg by mouth daily.    meloxicam (MOBIC)  7.5 MG tablet TAKE 1 TABLET EVERY DAY AS NEEDED FOR PAIN    pravastatin (PRAVACHOL) 80 MG tablet TAKE 1 TABLET EVERY DAY    No facility-administered encounter medications on file as of 09/29/2021.    Patient Active Problem List   Diagnosis Date Noted   UTI symptoms 04/13/2020   Dysuria 04/13/2020   Abnormal brain MRI 07/05/2018   Hx of adenomatous colonic polyps 09/21/2017   Primary insomnia 07/26/2017   Disorder of left rotator cuff 03/13/2017   Essential hypertension 03/13/2017    Bronchitis 09/21/2016   History of breast cancer 09/21/2016   Depression 05/13/2016   Memory loss 05/13/2016   Gastroesophageal reflux disease without esophagitis 05/13/2016   Hyperlipidemia 05/13/2016   Body mass index 34.0-34.9, adult 05/13/2016   Unspecified constipation 05/21/2014   FH: colon cancer 05/21/2014   Breast cancer of upper-outer quadrant of right female breast ([place]) 06/19/2013    Conditions to be addressed/monitored: monitor client completion of ADLs. Monitor anxiety management of client  Care Plan : LCSW Care Plan  Updates made by Katha Cabal, LCSW since 09/29/2021 12:00 AM     Problem: Coping Skills (General Plan of Care)      Goal: Coping Skills Enhanced; Manage Daily Activities; Taking care of spouse's needs   Start Date: 09/29/2021  Expected End Date: 12/27/2021  This Visit's Progress: On track  Recent Progress: On track  Priority: Medium  Note:   Current barriers:   Patient in need of assistance with connecting to community resources for possible help for ADLs completion of client Memory issues Some challenges in meeting care needs of her husband  Clinical Goals:   Client will call LCSW in next 30 days to discuss client completion of daily  activites Client will call LCSW in next 30 days to discuss care needs of her spouse Client will attend scheuled medical appointments in next 30 days Client will communicate in next 30 days with RNCM to discuss nursing needs of client  Clinical Interventions:  Collaboration with Janora Norlander, DO regarding development and update of comprehensive plan of care as evidenced by provider attestation and co-signature Discussed pain issues of client . She said she has some knee pain issues.  Discussed with client the care needs of her spouse. She said that her spouse has Parkinson's Disease. She said it is sometimes challenging to care for the daily needs of her spouse. She stated that her spouse is sometimes  incontinent. She is trying to gather information about obtaining incontinent supplies for her spouse Reviewed sleeping issues of client. Discussed relaxation techniques of client (she likes listening to music via TV, radio, previously liked to go to gym to exercise) Reviewed mood status with client. She said she does get sad sometimes.She is concerned over the care needs of her spouse. She said that overall she felt that her mood was stable Reviewed transport needs with client. She drives to needed appointments and to complete errands Encouraged client to call RNCM as needed for CCM nursing support Discussed socialization of client. She said she likes talking with her son in [place]. She also likes talking to some of her neighbors. Provided counseling support for client Discussed client access to her home. She said she can safely access her home Discussed mobility of client.  She said she sometimes has knee pain when standing  Patient Coping Skills:  Completes ADLs as needed Tries to attend scheduled medical appointments Has some support from her spouse  Patient Deficits: Memory  Issues Some walking challenges Decreased energy occasionally  Patient Goals: Over next 30 days, patient will: Attend scheduled medical appointments Completed ADLs as needed Call [facility] or LCSW as needed for CCM support  - Follow Up Plan: LCSW to call client on 11/23/21 at 2:00 PM to assess client needs.      Norva Riffle.Revan Gendron MSW, [place] representative [facility] Care Management [phone]

## 2021-10-01 ENCOUNTER — Telehealth: Payer: Self-pay

## 2021-10-01 NOTE — Chronic Care Management (AMB) (Signed)
°  Chronic Care Management   Note  10/01/2021 Name: JALENE DEMO MRN: [id number] DOB: 02/27/1951  TAKERIA MARQUINA is a 71 y.o. year old female who is a primary care patient of Janora Norlander, DO. TERIA KHACHATRYAN is currently enrolled in care management services. An additional referral for RN CM  was placed.   Follow up plan: Telephone appointment with care management team member scheduled for:10/05/2021  Noreene Larsson, [place], [place], [place] [address] Direct Dial: [phone] [email or web address]: [email or web address]

## 2021-10-05 ENCOUNTER — Telehealth: Payer: Medicare HMO | Admitting: *Deleted

## 2021-10-14 DIAGNOSIS — H524 Presbyopia: Secondary | ICD-10-CM | POA: Diagnosis not present

## 2021-10-21 ENCOUNTER — Other Ambulatory Visit: Payer: Self-pay | Admitting: Family Medicine

## 2021-10-21 ENCOUNTER — Other Ambulatory Visit: Payer: Self-pay | Admitting: Emergency Medicine

## 2021-10-21 DIAGNOSIS — Z1231 Encounter for screening mammogram for malignant neoplasm of breast: Secondary | ICD-10-CM

## 2021-10-22 ENCOUNTER — Ambulatory Visit (INDEPENDENT_AMBULATORY_CARE_PROVIDER_SITE_OTHER): Payer: Medicare HMO

## 2021-10-22 VITALS — Wt 256.0 lb

## 2021-10-22 DIAGNOSIS — Z0000 Encounter for general adult medical examination without abnormal findings: Secondary | ICD-10-CM | POA: Diagnosis not present

## 2021-10-22 NOTE — Patient Instructions (Signed)
Morgan Santos , Thank you for taking time to come for your Medicare Wellness Visit. I appreciate your ongoing commitment to your health goals. Please review the following plan we discussed and let me know if I can assist you in the future.   Screening recommendations/referrals: Colonoscopy: Done 10/17/2017 - Repeat in 5 years *next year Mammogram: Done 11/19/2020 - Repeat annually *appointment 11/25/2021 Bone Density: Done 12/03/2013 - Repeat every 2-5 years *we can do this at your next visit Recommended yearly ophthalmology/optometry visit for glaucoma screening and checkup Recommended yearly dental visit for hygiene and checkup  Vaccinations: Influenza vaccine: Declined Pneumococcal vaccine: Ask about once per lifetime Prevnar-20 Tdap vaccine: Due - recommended every 10 years Shingles vaccine: Due - recommend 2 doses 2-6 months apart - this is over 90% effective   Covid-19: Done 10/24/2019, 11/28/2019 & 05/2021 - bring your card to your next visit  Advanced directives: Please bring a copy of your health care power of attorney and living will to the office to be added to your chart at your convenience.   Conditions/risks identified: Aim for 30 minutes of exercise or brisk walking, 6-8 glasses of water, and 5 servings of fruits and vegetables each day.   Next appointment: Follow up in one year for your annual wellness visit    Preventive Care 65 Years and Older, Female Preventive care refers to lifestyle choices and visits with your health care provider that can promote health and wellness. What does preventive care include? A yearly physical exam. This is also called an annual well check. Dental exams once or twice a year. Routine eye exams. Ask your health care provider how often you should have your eyes checked. Personal lifestyle choices, including: Daily care of your teeth and gums. Regular physical activity. Eating a healthy diet. Avoiding tobacco and drug use. Limiting alcohol  use. Practicing safe sex. Taking low-dose aspirin every day. Taking vitamin and mineral supplements as recommended by your health care provider. What happens during an annual well check? The services and screenings done by your health care provider during your annual well check will depend on your age, overall health, lifestyle risk factors, and family history of disease. Counseling  Your health care provider may ask you questions about your: Alcohol use. Tobacco use. Drug use. Emotional well-being. Home and relationship well-being. Sexual activity. Eating habits. History of falls. Memory and ability to understand (cognition). Work and work Statistician. Reproductive health. Screening  You may have the following tests or measurements: Height, weight, and BMI. Blood pressure. Lipid and cholesterol levels. These may be checked every 5 years, or more frequently if you are over 85 years old. Skin check. Lung cancer screening. You may have this screening every year starting at age 70 if you have a 30-pack-year history of smoking and currently smoke or have quit within the past 15 years. Fecal occult blood test (FOBT) of the stool. You may have this test every year starting at age 49. Flexible sigmoidoscopy or colonoscopy. You may have a sigmoidoscopy every 5 years or a colonoscopy every 10 years starting at age 56. Hepatitis C blood test. Hepatitis B blood test. Sexually transmitted disease (STD) testing. Diabetes screening. This is done by checking your blood sugar (glucose) after you have not eaten for a while (fasting). You may have this done every 1-3 years. Bone density scan. This is done to screen for osteoporosis. You may have this done starting at age 33. Mammogram. This may be done every 1-2 years. Talk to  your health care provider about how often you should have regular mammograms. Talk with your health care provider about your test results, treatment options, and if necessary,  the need for more tests. Vaccines  Your health care provider may recommend certain vaccines, such as: Influenza vaccine. This is recommended every year. Tetanus, diphtheria, and acellular pertussis (Tdap, Td) vaccine. You may need a Td booster every 10 years. Zoster vaccine. You may need this after age 60. Pneumococcal 13-valent conjugate (PCV13) vaccine. One dose is recommended after age 80. Pneumococcal polysaccharide (PPSV23) vaccine. One dose is recommended after age 26. Talk to your health care provider about which screenings and vaccines you need and how often you need them. This information is not intended to replace advice given to you by your health care provider. Make sure you discuss any questions you have with your health care provider. Document Released: 09/04/2015 Document Revised: 04/27/2016 Document Reviewed: 06/09/2015 Elsevier Interactive Patient Education  2017 [place] Prevention in the Home Falls can cause injuries. They can happen to people of all ages. There are many things you can do to make your home safe and to help prevent falls. What can I do on the outside of my home? Regularly fix the edges of walkways and driveways and fix any cracks. Remove anything that might make you trip as you walk through a door, such as a raised step or threshold. Trim any bushes or trees on the path to your home. Use bright outdoor lighting. Clear any walking paths of anything that might make someone trip, such as rocks or tools. Regularly check to see if handrails are loose or broken. Make sure that both sides of any steps have handrails. Any raised decks and porches should have guardrails on the edges. Have any leaves, snow, or ice cleared regularly. Use sand or salt on walking paths during winter. Clean up any spills in your garage right away. This includes oil or grease spills. What can I do in the bathroom? Use night lights. Install grab bars by the toilet and in the  tub and shower. Do not use towel bars as grab bars. Use non-skid mats or decals in the tub or shower. If you need to sit down in the shower, use a plastic, non-slip stool. Keep the floor dry. Clean up any water that spills on the floor as soon as it happens. Remove soap buildup in the tub or shower regularly. Attach bath mats securely with double-sided non-slip rug tape. Do not have throw rugs and other things on the floor that can make you trip. What can I do in the bedroom? Use night lights. Make sure that you have a light by your bed that is easy to reach. Do not use any sheets or blankets that are too big for your bed. They should not hang down onto the floor. Have a firm chair that has side arms. You can use this for support while you get dressed. Do not have throw rugs and other things on the floor that can make you trip. What can I do in the kitchen? Clean up any spills right away. Avoid walking on wet floors. Keep items that you use a lot in easy-to-reach places. If you need to reach something above you, use a strong step stool that has a grab bar. Keep electrical cords out of the way. Do not use floor polish or wax that makes floors slippery. If you must use wax, use non-skid floor wax. Do not  have throw rugs and other things on the floor that can make you trip. What can I do with my stairs? Do not leave any items on the stairs. Make sure that there are handrails on both sides of the stairs and use them. Fix handrails that are broken or loose. Make sure that handrails are as long as the stairways. Check any carpeting to make sure that it is firmly attached to the stairs. Fix any carpet that is loose or worn. Avoid having throw rugs at the top or bottom of the stairs. If you do have throw rugs, attach them to the floor with carpet tape. Make sure that you have a light switch at the top of the stairs and the bottom of the stairs. If you do not have them, ask someone to add them for  you. What else can I do to help prevent falls? Wear shoes that: Do not have high heels. Have rubber bottoms. Are comfortable and fit you well. Are closed at the toe. Do not wear sandals. If you use a stepladder: Make sure that it is fully opened. Do not climb a closed stepladder. Make sure that both sides of the stepladder are locked into place. Ask someone to hold it for you, if possible. Clearly mark and make sure that you can see: Any grab bars or handrails. First and last steps. Where the edge of each step is. Use tools that help you move around (mobility aids) if they are needed. These include: Canes. Walkers. Scooters. Crutches. Turn on the lights when you go into a dark area. Replace any light bulbs as soon as they burn out. Set up your furniture so you have a clear path. Avoid moving your furniture around. If any of your floors are uneven, fix them. If there are any pets around you, be aware of where they are. Review your medicines with your doctor. Some medicines can make you feel dizzy. This can increase your chance of falling. Ask your doctor what other things that you can do to help prevent falls. This information is not intended to replace advice given to you by your health care provider. Make sure you discuss any questions you have with your health care provider. Document Released: 06/04/2009 Document Revised: 01/14/2016 Document Reviewed: 09/12/2014 Elsevier Interactive Patient Education  2017 [employer].

## 2021-10-22 NOTE — Progress Notes (Signed)
Subjective:   Morgan Santos is a 71 y.o. female who presents for Medicare Annual (Subsequent) preventive examination.  Virtual Visit via Telephone Note  I connected with  Morgan Santos on 10/22/21 at  1:15 PM EST by telephone and verified that I am speaking with the correct person using two identifiers.  Location: Patient: Home Provider: [facility] Persons participating in the virtual visit: patient/Nurse Health Advisor   I discussed the limitations, risks, security and privacy concerns of performing an evaluation and management service by telephone and the availability of in person appointments. The patient expressed understanding and agreed to proceed.  Interactive audio and video telecommunications were attempted between this nurse and patient, however failed, due to patient having technical difficulties OR patient did not have access to video capability.  We continued and completed visit with audio only.  Some vital signs may be absent or patient reported.   Morgan Santos E Morgan Wavra, LPN   Review of Systems     Cardiac Risk Factors include: advanced age (>70men, >8 women);obesity (BMI >30kg/m2);sedentary lifestyle;dyslipidemia;hypertension     Objective:    Today's Vitals   10/22/21 1319  Weight: 256 lb (116.1 kg)  PainSc: 7    Body mass index is 36.21 kg/m.  Advanced Directives 10/22/2021 10/21/2020 10/16/2019 10/10/2017 12/08/2016 06/11/2015 04/02/2015  Does Patient Have a Medical Advance Directive? Yes No Yes Yes No No No  Type of Paramedic of [place];Living will - Out of facility DNR (pink MOST or yellow form);Healthcare Power of [place]  Does patient want to make changes to medical advance directive? - No - Patient declined - - - - -  Copy of [place] in Chart? No - copy requested - - Yes - - -  Would patient like information on creating a medical advance directive? - No - Patient declined - - - - No -  patient declined information    Current Medications (verified) Outpatient Encounter Medications as of 10/22/2021  Medication Sig   albuterol (VENTOLIN HFA) 108 (90 Base) MCG/ACT inhaler USE 2 PUFFS EVERY 6 HOURS AS NEEDED   ALPRAZolam (XANAX) 0.5 MG tablet Take 0.5-1 tablets (0.25-0.5 mg total) by mouth 2 (two) times daily as needed for anxiety.   ARTIFICIAL TEAR OP Place 1 drop into both eyes daily as needed (dry eyes).   aspirin EC 81 MG tablet Take 81 mg by mouth every other day.   Calcium-Magnesium-Zinc 500-250-12.5 MG TABS Take 1 tablet by mouth daily.    Cholecalciferol (VITAMIN D-3) 5000 UNITS TABS Take 10,000 Units by mouth daily.    citalopram (CELEXA) 40 MG tablet Take 1 tablet (40 mg total) by mouth daily.   Cyanocobalamin (B-12) 2500 MCG SUBL Place 2,500 mcg under the tongue daily.   magnesium oxide (MAG-OX) 400 MG tablet Take 400 mg by mouth daily.   meloxicam (MOBIC) 7.5 MG tablet TAKE 1 TABLET EVERY DAY AS NEEDED FOR PAIN   pravastatin (PRAVACHOL) 80 MG tablet TAKE 1 TABLET EVERY DAY   budesonide-formoterol (SYMBICORT) 160-4.5 MCG/ACT inhaler Inhale 1 puff into the lungs 2 (two) times daily. (Patient not taking: Reported on 10/22/2021)   clindamycin (CLEOCIN) 150 MG capsule Take by mouth 3 (three) times daily. (Patient not taking: Reported on 10/22/2021)   levocetirizine (XYZAL) 5 MG tablet Take 1 tablet (5 mg total) by mouth daily as needed for allergies. For itching (Patient not taking: Reported on 10/22/2021)   No facility-administered encounter medications on  file as of 10/22/2021.    Allergies (verified) Codeine, Crestor [rosuvastatin calcium], Lipitor [atorvastatin], Namenda [memantine hcl], Peanut-containing drug products, and Multihance [gadobenate]   History: Past Medical History:  Diagnosis Date   Anemia    when she was younger   Anxiety    Arthritis    hip- R, back; osteoarthritis   Asthma    Breast cancer ([place])    right   Complication of anesthesia     Constipation    Depression    pt. remarks that she "takes respridal because if I don't take it I get angry"    GERD (gastroesophageal reflux disease)    pt. uses vinegar for heartburn, ususally once per week    H/O degenerative disc disease    Hyperlipidemia    Hypertension    "when I get upset"  Not on medication   Memory loss    PONV (postoperative nausea and vomiting)    "just for one day"   Stroke [facility]) ?2010 or 2011   2010, [facility] for stroke "mini stroke" short term memory problems since   Past Surgical History:  Procedure Laterality Date   BREAST RECONSTRUCTION WITH PLACEMENT OF TISSUE EXPANDER AND FLEX HD (ACELLULAR HYDRATED DERMIS) Right 07/12/2013   Procedure: RIGHT BREAST RECONSTRUCTION WITH PLACEMENT OF TISSUE EXPANDER AND FLEX HD TO RIGHT BREAST (ACELLULAR HYDRATED DERMIS);  Surgeon: Irene Limbo, MD;  Location: [place];  Service: Plastics;  Laterality: Right;   BREAST SURGERY     for abcesses- removed from both breasts, many yrs. ago   CHOLECYSTECTOMY     COLONOSCOPY   01/29/2004   NUR:A 7-8 mm polyp snared from the hepatic flexure.  Another 3 mm polyp was     cold snared from the sigmoid colon.External hemorrhoids, possible source of recent rectal bleeding   COLONOSCOPY N/A 06/05/2014   Procedure: COLONOSCOPY;  Surgeon: Danie Binder, MD;  [facility];  Service: Endoscopy;  Laterality: N/A;  1030 - moved to 10:45 - Ginger to notify pt    COLONOSCOPY WITH PROPOFOL N/A 10/17/2017   Procedure: COLONOSCOPY WITH PROPOFOL;  Surgeon: Danie Binder, MD;  [facility];  Service: Endoscopy;  Laterality: N/A;  10:30am   DILATION AND CURETTAGE OF UTERUS     LUMBAR SPINE SURGERY     MASTECTOMY Right    MASTECTOMY W/ SENTINEL NODE BIOPSY Right 07/12/2013   Procedure: MASTECTOMY WITH SENTINEL LYMPH NODE BIOPSY;  Surgeon: Rolm Bookbinder, MD;  Location: [place];  Service: General;  Laterality: Right;   MASTOPEXY Left 01/14/2014   Procedure: LEFT  BREAST MASTOPEXY FOR SYMMETRY ;  Surgeon: Irene Limbo, MD;  Location: [place];  Service: Plastics;  Laterality: Left;   PORT-A-CATH REMOVAL Left 01/14/2014   Procedure: REMOVAL PORT-A-CATH;  Surgeon: Irene Limbo, MD;  Location: [place];  Service: Plastics;  Laterality: Left;   PORTACATH PLACEMENT N/A 09/16/2013   Procedure: INSERTION PORT-A-CATH;  Surgeon: Rolm Bookbinder, MD;  Location: [place];  Service: General;  Laterality: N/A;   REDUCTION MAMMAPLASTY Left    REMOVAL OF BILATERAL TISSUE EXPANDERS WITH PLACEMENT OF BILATERAL BREAST IMPLANTS Right 01/14/2014   Procedure: REMOVAL OF RIGHT TISSUE EXPANDERS WITH PLACEMENT OF PERMANENT BREAST IMPLANT;  Surgeon: Irene Limbo, MD;  Location: [place];  Service: Plastics;  Laterality: Right;   TUBAL LIGATION     VAGINAL HYSTERECTOMY     Family History  Problem Relation Age of Onset   Hypertension Mother  Diabetes type II Mother    CVA Mother    Heart disease Mother    Osteoarthritis Mother    Depression Mother    Other Mother        degenerative disc disease   Alcoholism Father    Pneumonia Father    Colon cancer Maternal Aunt        age 1s   Colon cancer Other        maternal great aunt   Diabetes Sister    Hyperlipidemia Sister    Hypertension Sister    Cancer Brother    Diabetes Sister    Hypertension Sister    Diabetes Sister    Hypertension Sister    Diabetes Sister    Kidney disease Sister    Colon polyps Neg Hx    Social History   Socioeconomic History   Marital status: Married    Spouse name: [name]   Number of children: 4   Years of education: 12th   Highest education level: High school graduate  Occupational History   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   Right-handed.   1 cup coffee per day, occasional soda.   Social Determinants of Health   Financial Resource Strain: Low Risk    Difficulty of Paying Living Expenses: Not very hard  Food Insecurity: No Food Insecurity   Worried About Charity fundraiser in the Last Year: Never true   Ran Out of Food in the Last Year: Never true  Transportation Needs: No Transportation Needs   Lack of Transportation (Medical): No   Lack of Transportation (Non-Medical): No  Physical Activity: Insufficiently Active   Days of Exercise per Week: 3 days   Minutes of Exercise per Session: 30 min  Stress: Stress Concern Present   Feeling of Stress : To some extent  Social Connections: Engineer, building services of Communication with Friends and Family: More than three times a week   Frequency of Social Gatherings with Friends and Family: More than three times a week   Attends Religious Services: More than 4 times per year   Active Member of [employer] or Organizations: Yes   Attends Music therapist: More than 4 times per year   Marital Status: Married    Tobacco Counseling Counseling given: Not Answered   Clinical Intake:  Pre-visit preparation completed: Yes  Pain : 0-10 Pain Score: 7  Pain Type: Chronic pain Pain Location: Back Pain Orientation: Right, Left Pain Radiating Towards: both legs Pain Descriptors / Indicators: Discomfort, Aching, Sore Pain Onset: More than a month ago Pain Frequency: Intermittent     BMI - recorded: 36.21 Nutritional Status: BMI > 30  Obese Nutritional Risks: None Diabetes: No  How often do you need to  have someone help you  when you read instructions, pamphlets, or other written materials from your doctor or pharmacy?: 2 - Rarely  Diabetic? no  Interpreter Needed?: No  Information entered by :: Ejay Lashley, LPN   Activities of Daily Living In your present state of health, do you have any difficulty performing the following activities: 10/22/2021  Hearing? N  Vision? N  Difficulty concentrating or making decisions? Y  Walking or climbing stairs? N  Dressing or bathing? N  Doing errands, shopping? Y  Comment she drives a little, but not usually alone  Preparing Food and eating ? N  Using the Toilet? N  In the past six months, have you accidently leaked urine? Y  Comment especially at night, wears pads for protection  Do you have problems with loss of bowel control? N  Managing your Medications? N  Managing your Finances? N  Housekeeping or managing your Housekeeping? N  Some recent data might be hidden    Patient Care Team: Janora Norlander, DO as PCP - General (Family Medicine) Satira Sark, MD as PCP - Cardiology (Cardiology) Amada Kingfisher, MD as Consulting Physician (Hematology and Oncology) Danie Binder, MD (Inactive) as Consulting Physician (Gastroenterology) Norwood Levo, MD as Consulting Physician (Neurology) Shea Evans Norva Riffle, LCSW as [place] (Licensed Clinical Social Worker) Ilean China, RN as [place] any recent [place] you may have received from other than [facility] providers in the past year (date may be approximate).     Assessment:   This is a routine wellness examination for [place].  Hearing/Vision screen Hearing Screening - Comments:: Denies hearing difficulties; right ear tinnitus  Vision Screening - Comments:: Wears rx glasses - up to date with routine eye exams with [facility]  Dietary issues and exercise activities discussed: Current Exercise Habits:  Home exercise routine, Type of exercise: walking, Time (Minutes): 30, Frequency (Times/Week): 3, Weekly Exercise (Minutes/Week): 90, Intensity: Mild, Exercise limited by: psychological condition(s);orthopedic condition(s)   Goals Addressed             This Visit's Progress    Manage My Emotions: Complete ADLs as needed; Address care needs of her spouse   On track    Timeframe:  Short-Term Goal Priority:  Medium Progress:  On Track Start Date:           09/29/21               Expected End Date:         12/27/21           Follow Up Date  11/23/21 at 2:00 PM    Manage Emotions; Complete ADLs as needed; Address care needs of her spouse   Why is this important?   When you are stressed, down or upset, your body reacts too.  For example, your blood pressure may get higher; you may have a headache or stomachache.  When your emotions get the best of you, your body's ability to fight off cold and flu gets weak.  These steps will help you manage your emotions.    Patient Coping Skills:  Completes ADLs as needed Tries to attend scheduled medical appointments Has some support from her spouse  Patient Deficits: Memory Issues Some walking challenges Decreased energy occasionally  Patient Goals: Over next 30 days, patient will: Attend scheduled medical appointments Completed ADLs as needed Call RNCM or LCSW as needed for CCM support  - Follow Up Plan: LCSW to call client on  11/23/21 at 2:00 PM to assess needs of client at that time.           Depression Screen PHQ 2/9 Scores 10/22/2021 09/29/2021 08/11/2021 07/05/2021 04/23/2021 04/23/2021 04/23/2021  PHQ - 2 Score 2 2 2 2 2 2 2   PHQ- 9 Score 7 8 8 9 11 11 11     Fall Risk Fall Risk  10/22/2021 07/13/2021 04/09/2021 10/21/2020 04/23/2020  Falls in the past year? 1 0 0 0 0  Number falls in past yr: 0 0 - - -  Injury with Fall? 0 0 - - -  Risk Factor Category  - - - - -  Risk for fall due to : Impaired balance/gait;Orthopedic  patient;Medication side effect No Fall Risks - - -  Follow up Falls prevention discussed Falls evaluation completed - - -    FALL RISK PREVENTION PERTAINING TO THE HOME:  Any stairs in or around the home? Yes  If so, are there any without handrails? No  Home free of loose throw rugs in walkways, pet beds, electrical cords, etc? Yes  Adequate lighting in your home to reduce risk of falls? Yes   ASSISTIVE DEVICES UTILIZED TO PREVENT FALLS:  Life alert? No  Use of a cane, walker or w/c?  Prn cane only Grab bars in the bathroom? No  Shower chair or bench in shower? No  Elevated toilet seat or a handicapped toilet? No   TIMED UP AND GO:  Was the test performed? No . Telephonic visit   Cognitive Function: Cognitive status assessed by direct observation. Patient has current diagnosis of cognitive impairment. Patient is unable to complete screening 6CIT or MMSE.    MMSE - Mini Mental State Exam 10/22/2021 07/05/2018  Not completed: Unable to complete;Refused -  Orientation to time - 5  Orientation to Place - 5  Registration - 3  Attention/ Calculation - 5  Recall - 3  Language- name 2 objects - 2  Language- repeat - 1  Language- follow 3 step command - 3  Language- read & follow direction - 1  Write a sentence - 1  Copy design - 1  Total score - 30     6CIT Screen 10/21/2020 10/16/2019 10/16/2019  What Year? 0 points - 0 points  What month? 0 points - 0 points  What time? 0 points - 0 points  Count back from 20 2 points - 0 points  Months in reverse 4 points - 2 points  Repeat phrase 2 points 6 points 6 points  Total Score 8 - 8    Immunizations Immunization History  Administered Date(s) Administered   PFIZER(Purple Top)SARS-COV-2 Vaccination 10/24/2019, 11/08/2019, 11/28/2019   Td 09/06/2001    TDAP status: Due, Education has been provided regarding the importance of this vaccine. Advised may receive this vaccine at local pharmacy or Health Dept. Aware to provide a copy  of the vaccination record if obtained from local pharmacy or Health Dept. Verbalized acceptance and understanding.  Flu Vaccine status: Declined, Education has been provided regarding the importance of this vaccine but patient still declined. Advised may receive this vaccine at local pharmacy or Health Dept. Aware to provide a copy of the vaccination record if obtained from local pharmacy or Health Dept. Verbalized acceptance and understanding.  Pneumococcal vaccine status: Due, Education has been provided regarding the importance of this vaccine. Advised may receive this vaccine at local pharmacy or Health Dept. Aware to provide a copy of the vaccination record if obtained from local  pharmacy or Health Dept. Verbalized acceptance and understanding.  Covid-19 vaccine status: Completed vaccines  Qualifies for Shingles Vaccine? Yes   Zostavax completed No   Shingrix Completed?: No.    Education has been provided regarding the importance of this vaccine. Patient has been advised to call insurance company to determine out of pocket expense if they have not yet received this vaccine. Advised may also receive vaccine at local pharmacy or Health Dept. Verbalized acceptance and understanding.  Screening Tests Health Maintenance  Topic Date Due   Zoster Vaccines- Shingrix (1 of 2) Never done   Pneumonia Vaccine 48+ Years old (1 - PCV) Never done   DEXA SCAN  12/04/2018   COVID-19 Vaccine (4 - Booster) 01/23/2020   TETANUS/TDAP  04/09/2022 (Originally 09/07/2011)   MAMMOGRAM  11/19/2021   COLONOSCOPY (Pts 45-40yrs Insurance coverage will need to be confirmed)  10/17/2022   Hepatitis C Screening  Completed   HPV VACCINES  Aged Out   INFLUENZA VACCINE  Discontinued    Health Maintenance  Health Maintenance Due  Topic Date Due   Zoster Vaccines- Shingrix (1 of 2) Never done   Pneumonia Vaccine 40+ Years old (1 - PCV) Never done   DEXA SCAN  12/04/2018   COVID-19 Vaccine (4 - Booster) 01/23/2020     Colorectal cancer screening: Type of screening: Colonoscopy. Completed 10/17/2017. Repeat every 5 years  Mammogram status: Completed 11/19/2020. Repeat every year appt 11/25/21  Bone Density status: Completed 12/03/2013. Results reflect: Bone density results: NORMAL. Repeat every 5 years. Recommended she do this at next appt  Lung Cancer Screening: (Low Dose CT Chest recommended if Age 80-80 years, 30 pack-year currently smoking OR have quit w/in 15years.) does qualify.   Additional Screening:  Hepatitis C Screening: does qualify; Completed 09/24/2019  Vision Screening: Recommended annual ophthalmology exams for early detection of glaucoma and other disorders of the eye. Is the patient up to date with their annual eye exam?  Yes  Who is the provider or what is the name of the office in which the patient attends annual eye exams? [place] If pt is not established with a provider, would they like to be referred to a provider to establish care? No .   Dental Screening: Recommended annual dental exams for proper oral hygiene  Community Resource Referral / Chronic Care Management: CRR required this visit?  No   CCM required this visit?  No      Plan:     I have personally reviewed and noted the following in the patients chart:   Medical and social history Use of alcohol, tobacco or illicit drugs  Current medications and supplements including opioid prescriptions.  Functional ability and status Nutritional status Physical activity Advanced directives List of other physicians Hospitalizations, surgeries, and ER visits in previous 12 months Vitals Screenings to include cognitive, depression, and falls Referrals and appointments  In addition, I have reviewed and discussed with patient certain preventive protocols, quality metrics, and best practice recommendations. A written personalized care plan for preventive services as well as general preventive health recommendations  were provided to patient.     Sandrea Hammond, LPN   02/19/6966   Nurse Notes: None

## 2021-10-28 DIAGNOSIS — M79674 Pain in right toe(s): Secondary | ICD-10-CM | POA: Diagnosis not present

## 2021-10-28 DIAGNOSIS — L03031 Cellulitis of right toe: Secondary | ICD-10-CM | POA: Diagnosis not present

## 2021-11-11 DIAGNOSIS — L03031 Cellulitis of right toe: Secondary | ICD-10-CM | POA: Diagnosis not present

## 2021-11-11 DIAGNOSIS — M79676 Pain in unspecified toe(s): Secondary | ICD-10-CM | POA: Diagnosis not present

## 2021-11-17 ENCOUNTER — Ambulatory Visit: Payer: Medicare HMO | Admitting: Family Medicine

## 2021-11-23 ENCOUNTER — Ambulatory Visit (INDEPENDENT_AMBULATORY_CARE_PROVIDER_SITE_OTHER): Payer: Medicare HMO | Admitting: Licensed Clinical Social Worker

## 2021-11-23 DIAGNOSIS — E782 Mixed hyperlipidemia: Secondary | ICD-10-CM

## 2021-11-23 DIAGNOSIS — Z800 Family history of malignant neoplasm of digestive organs: Secondary | ICD-10-CM

## 2021-11-23 DIAGNOSIS — R413 Other amnesia: Secondary | ICD-10-CM

## 2021-11-23 DIAGNOSIS — K219 Gastro-esophageal reflux disease without esophagitis: Secondary | ICD-10-CM

## 2021-11-23 DIAGNOSIS — I10 Essential (primary) hypertension: Secondary | ICD-10-CM

## 2021-11-23 DIAGNOSIS — F331 Major depressive disorder, recurrent, moderate: Secondary | ICD-10-CM

## 2021-11-23 NOTE — Patient Instructions (Addendum)
Visit Information ? ?Patient Goals: Manage Emotions; Complete ADLs as needed. Address care needs of her spouse ? ?Timeframe:  Short-Term Goal ?Priority:  Medium ?Progress:  On Track ?Start Date:           09/29/21               ?Expected End Date:         02/21/22           ? ?Follow Up Date  01/18/22 at 2:00 PM ?  ? Manage Emotions; Complete ADLs as needed; Address care needs of her spouse ?  ?Why is this important?   ?When you are stressed, down or upset, your body reacts too.  ?For example, your blood pressure may get higher; you may have a headache or stomachache.  ?When your emotions get the best of you, your body's ability to fight off cold and flu gets weak.  ?These steps will help you manage your emotions.   ? ?Patient Coping Skills:  ?Completes ADLs as needed ?Tries to attend scheduled medical appointments ?Has some support from her spouse ? ?Patient Deficits: ?Memory Issues ?Some walking challenges ?Decreased energy occasionally ? ?Patient Goals: Over next 30 days, patient will: ?Attend scheduled medical appointments ?Completed ADLs as needed ?Call RNCM or LCSW as needed for CCM support ? ?Follow Up Plan: LCSW to call client on 01/18/22 at 2:00 PM to assess needs of client at that time.   ? ?Norva Riffle.Duan Scharnhorst MSW, LCSW ?Licensed Clinical Social Worker ?[facility] ?[phone] ?

## 2021-11-23 NOTE — Chronic Care Management (AMB) (Signed)
?Chronic Care Management  ? ? Clinical Social Work Note ? ?11/23/2021 ?Name: Morgan Santos MRN: [id number] DOB: 11/18/1950 ? ?Morgan Santos is a 71 y.o. year old female who is a primary care patient of Janora Norlander, DO. The CCM team was consulted to assist the patient with chronic disease management and/or care coordination needs related to: [employer] .  ? ?Engaged with patient by telephone for follow up visit in response to provider referral for social work chronic care management and care coordination services.  ? ?Consent to Services:  ?The patient was given information about Chronic Care Management services, agreed to services, and gave verbal consent prior to initiation of services.  Please see initial visit note for detailed documentation.  ? ?Patient agreed to services and consent obtained.  ? ?Assessment: Review of patient past medical history, allergies, medications, and health status, including review of relevant consultants reports was performed today as part of a comprehensive evaluation and provision of chronic care management and care coordination services.    ? ?SDOH (Social Determinants of Health) assessments and interventions performed:  ?SDOH Interventions   ? ?Flowsheet Row Most Recent Value  ?SDOH Interventions   ?Stress Interventions Provide Counseling  [client has stress related to managing her medical needs. she has stress related to managing needs of her spouse]  ?Depression Interventions/Treatment  Counseling, Medication  ? ?  ?  ? ?Advanced Directives Status: See Vyn[employer] application for related entries. ? ?CCM Care Plan ? ?Allergies  ?Allergen Reactions  ? Codeine Nausea And Vomiting  ? Crestor [Rosuvastatin Calcium]   ?  Muscle pain  ? Lipitor [Atorvastatin]   ?  Muscle pain  ? Namenda [Memantine Hcl]   ?  Nightmares   ? Peanut-Containing Drug Products   ?  Peanut butter triggers asthma   ? Multihance [Gadobenate] Nausea Only and Cough  ?  PT HAD INCREASED MUCOUS PRODUCTION AND  PHLEGMY COUGH LASTING 10 MINS AFTER INJECTION, PT SENT HOME WITH BENADRYL, PREV NAUSEA AFTER GAD  ? ? ?Outpatient Encounter Medications as of 11/23/2021  ?Medication Sig Note  ? albuterol (VENTOLIN HFA) 108 (90 Base) MCG/ACT inhaler USE 2 PUFFS EVERY 6 HOURS AS NEEDED   ? ALPRAZolam (XANAX) 0.5 MG tablet Take 0.5-1 tablets (0.25-0.5 mg total) by mouth 2 (two) times daily as needed for anxiety.   ? ARTIFICIAL TEAR OP Place 1 drop into both eyes daily as needed (dry eyes).   ? aspirin EC 81 MG tablet Take 81 mg by mouth every other day. 10/02/2017: Pt supposed to take it every morning but its more like every other day  ? budesonide-formoterol (SYMBICORT) 160-4.5 MCG/ACT inhaler Inhale 1 puff into the lungs 2 (two) times daily. (Patient not taking: Reported on 10/22/2021)   ? Calcium-Magnesium-Zinc 500-250-12.5 MG TABS Take 1 tablet by mouth daily.    ? Cholecalciferol (VITAMIN D-3) 5000 UNITS TABS Take 10,000 Units by mouth daily.    ? citalopram (CELEXA) 40 MG tablet Take 1 tablet (40 mg total) by mouth daily.   ? clindamycin (CLEOCIN) 150 MG capsule Take by mouth 3 (three) times daily. (Patient not taking: Reported on 10/22/2021)   ? Cyanocobalamin (B-12) 2500 MCG SUBL Place 2,500 mcg under the tongue daily.   ? levocetirizine (XYZAL) 5 MG tablet Take 1 tablet (5 mg total) by mouth daily as needed for allergies. For itching (Patient not taking: Reported on 10/22/2021)   ? magnesium oxide (MAG-OX) 400 MG tablet Take 400 mg by mouth daily.   ?  meloxicam (MOBIC) 7.5 MG tablet TAKE 1 TABLET EVERY DAY AS NEEDED FOR PAIN   ? pravastatin (PRAVACHOL) 80 MG tablet TAKE 1 TABLET EVERY DAY   ? ?No facility-administered encounter medications on file as of 11/23/2021.  ? ? ?Patient Active Problem List  ? Diagnosis Date Noted  ? UTI symptoms 04/13/2020  ? Dysuria 04/13/2020  ? Abnormal brain MRI 07/05/2018  ? Hx of adenomatous colonic polyps 09/21/2017  ? Primary insomnia 07/26/2017  ? Disorder of left rotator cuff 03/13/2017  ? Essential  hypertension 03/13/2017  ? B12 deficiency 01/19/2017  ? Bronchitis 09/21/2016  ? History of breast cancer 09/21/2016  ? Depression 05/13/2016  ? Memory loss 05/13/2016  ? Gastroesophageal reflux disease without esophagitis 05/13/2016  ? Hyperlipidemia 05/13/2016  ? Body mass index 34.0-34.9, adult 05/13/2016  ? Dementia without behavioral disturbance ([place]) 09/17/2015  ? Unspecified constipation 05/21/2014  ? FH: colon cancer 05/21/2014  ? Breast cancer of upper-outer quadrant of right female breast ([place]) 06/19/2013  ? ? ?Conditions to be addressed/monitored: monitor client completion of ADLs, as needed ? ? ?Care Plan : LCSW Care Plan  ?Updates made by Katha Cabal, LCSW since 11/23/2021 12:00 AM  ?  ? ?Problem: Coping Skills (General Plan of Care)   ?  ? ?Goal: Coping Skills Enhanced; Manage Daily Activities; Taking care of spouse's needs   ?Start Date: 11/23/2021  ?Expected End Date: 02/21/2022  ?This Visit's Progress: On track  ?Recent Progress: On track  ?Priority: Medium  ?Note:   ?Current barriers:   ?Patient in need of assistance with connecting to community resources for possible help for ADLs completion of client ?Memory issues ?Some challenges in meeting care needs of her husband ? ?Clinical Goals:  ? ?Client will call LCSW in next 30 days to discuss client completion of daily  activites ?Client will call LCSW in next 30 days to discuss care needs of her spouse ?Client will attend scheuled medical appointments in next 30 days ?Client will communicate in next 30 days with RNCM to discuss nursing needs of client ? ?Clinical Interventions:  ?Collaboration with Janora Norlander, DO regarding development and update of comprehensive plan of care as evidenced by provider attestation and co-signature ?Discussed pain issues of client . She said she has some knee pain issues.  ?Discussed with client the care needs of her spouse. She said that her spouse has Parkinson's Disease. She said it is sometimes challenging  to care for the daily needs of her spouse. She stated that her spouse is sometimes incontinent. She is trying to gather information about obtaining incontinent supplies for her spouse ?Reviewed sleeping issues of client. She said that sometimes she has difficulty sleeping ?Reviewed mood status with client. She said she does get sad sometimes.She is concerned over the care needs of her spouse. She said that overall she felt that her mood was stable.  ?Reviewed transport needs with client. She drives to needed appointments and to complete errands ?Encouraged client to call RNCM as needed for CCM nursing support for client ?Discussed socialization of client. She said she likes talking with her son in [place]. She also likes talking to some of her neighbors. ?Provided counseling support for client ?Discussed mobility of client.  She said she sometimes has knee pain when standing. She said she is walking without use of a device ?Discussed ADLs completion of client.  ? ?Patient Coping Skills:  ?Completes ADLs as needed ?Tries to attend scheduled medical appointments ?Has some support from her  spouse ? ?Patient Deficits: ?Memory Issues ?Some walking challenges ?Decreased energy occasionally ? ?Patient Goals: Over next 30 days, patient will: ?Attend scheduled medical appointments ?Completed ADLs as needed ?Call RNCM or LCSW as needed for CCM support ? ?Follow Up Plan: LCSW to call client on 01/18/22 at 2:00 PM to assess client needs.   ?  ?Norva Riffle.Joe Tanney MSW, LCSW ?Licensed Clinical Social Worker ?[facility] ?[phone] ?

## 2021-11-25 ENCOUNTER — Ambulatory Visit
Admission: RE | Admit: 2021-11-25 | Discharge: 2021-11-25 | Disposition: A | Discharge status: Home / Self Care | Payer: Medicare HMO | Source: Ambulatory Visit | Attending: Family Medicine | Admitting: Family Medicine

## 2021-11-25 DIAGNOSIS — Z1231 Encounter for screening mammogram for malignant neoplasm of breast: Secondary | ICD-10-CM

## 2021-12-13 ENCOUNTER — Encounter: Payer: Self-pay | Admitting: Orthopedic Surgery

## 2021-12-13 ENCOUNTER — Ambulatory Visit: Payer: Medicare HMO | Admitting: Orthopedic Surgery

## 2021-12-13 ENCOUNTER — Ambulatory Visit (INDEPENDENT_AMBULATORY_CARE_PROVIDER_SITE_OTHER): Payer: Medicare HMO

## 2021-12-13 VITALS — BP 150/73 | HR 59 | Ht 69.5 in | Wt 254.8 lb

## 2021-12-13 DIAGNOSIS — M25551 Pain in right hip: Secondary | ICD-10-CM | POA: Diagnosis not present

## 2021-12-13 DIAGNOSIS — M545 Low back pain, unspecified: Secondary | ICD-10-CM

## 2021-12-13 NOTE — Progress Notes (Signed)
NEW PROBLEM//OFFICE VISIT ? ? ?Chief Complaint  ?Patient presents with  ? Hip Pain  ?  Right- for two days but not hurting now ?  ? ?71 year old female presents complaining of back pain giving way of the right leg but is now better.  She had some pain in the groin as well she had difficulty going up steps and sitting for long periods ? ?She has a previous history of seeing a chiropractor many years ago she is not sure if it worked for her or not but she has had a long interval of pain-free symptoms ? ?She denies any trauma ? ?Review of systems denies any excessive activity ringing in the ears itching of the skin history of cough leg edema joint back pain muscle ache constipation occasional tingling depression nervousness and memory loss ? ? ? ? ?BP (!) 150/73   Pulse (!) 59   Ht 5' 9.5" (1.765 m)   Wt 254 lb 12.8 oz (115.6 kg)   BMI 37.09 kg/m?  ? ?Body mass index is 37.09 kg/m?. ? ?General appearance: Well-developed well-nourished no gross deformities ? ?Cardiovascular normal pulse and perfusion normal color without edema ? ?Neurologically no sensation loss or deficits or pathologic reflexes ? ?Psychological: Awake alert and oriented x3 mood and affect normal ? ?Skin no lacerations or ulcerations no nodularity no palpable masses, no erythema or nodularity ? ?Musculoskeletal:  ?Examination of the lumbar spine shows tenderness in the lower back especially on the right decreased range of motion and painful flexion extension but more extension than flexion ? ?Lower extremities normal motor exam. ? ? ? ? ? ?Past Medical History:  ?Diagnosis Date  ? Anemia   ? when she was younger  ? Anxiety   ? Arthritis   ? hip- R, back; osteoarthritis  ? Asthma   ? Breast cancer ([place])   ? right  ? Complication of anesthesia   ? Constipation   ? Depression   ? pt. remarks that she "takes respridal because if I don't take it I get angry"   ? GERD (gastroesophageal reflux disease)   ? pt. uses vinegar for heartburn, ususally once per  week   ? H/O degenerative disc disease   ? Hyperlipidemia   ? Hypertension   ? "when I get upset"  Not on medication  ? Memory loss   ? PONV (postoperative nausea and vomiting)   ? "just for one day"  ? Stroke [facility]) ?2010 or 2011  ? 2010, [facility] for stroke "mini stroke" short term memory problems since  ? ? ?Past Surgical History:  ?Procedure Laterality Date  ? BREAST RECONSTRUCTION WITH PLACEMENT OF TISSUE EXPANDER AND FLEX HD (ACELLULAR HYDRATED DERMIS) Right 07/12/2013  ? Procedure: RIGHT BREAST RECONSTRUCTION WITH PLACEMENT OF TISSUE EXPANDER AND FLEX HD TO RIGHT BREAST (ACELLULAR HYDRATED DERMIS);  Surgeon: Irene Limbo, MD;  Location: [place];  Service: Plastics;  Laterality: Right;  ? BREAST SURGERY    ? for abcesses- removed from both breasts, many yrs. ago  ? CHOLECYSTECTOMY    ? COLONOSCOPY   01/29/2004  ? NUR:A 7-8 mm polyp snared from the hepatic flexure.  Another 3 mm polyp was     cold snared from the sigmoid colon.External hemorrhoids, possible source of recent rectal bleeding  ? COLONOSCOPY N/A 06/05/2014  ? Procedure: COLONOSCOPY;  Surgeon: Danie Binder, MD;  [facility];  Service: Endoscopy;  Laterality: N/A;  1030 - moved to 10:45 - Ginger to notify pt ?  ?  COLONOSCOPY WITH PROPOFOL N/A 10/17/2017  ? Procedure: COLONOSCOPY WITH PROPOFOL;  Surgeon: Danie Binder, MD;  [facility];  Service: Endoscopy;  Laterality: N/A;  10:30am  ? DILATION AND CURETTAGE OF UTERUS    ? LUMBAR SPINE SURGERY    ? MASTECTOMY Right   ? MASTECTOMY W/ SENTINEL NODE BIOPSY Right 07/12/2013  ? Procedure: MASTECTOMY WITH SENTINEL LYMPH NODE BIOPSY;  Surgeon: Rolm Bookbinder, MD;  Location: [place];  Service: General;  Laterality: Right;  ? MASTOPEXY Left 01/14/2014  ? Procedure: LEFT BREAST MASTOPEXY FOR SYMMETRY ;  Surgeon: Irene Limbo, MD;  Location: [place];  Service: Plastics;  Laterality: Left;  ? PORT-A-CATH REMOVAL Left 01/14/2014  ? Procedure: REMOVAL  PORT-A-CATH;  Surgeon: Irene Limbo, MD;  Location: [place];  Service: Plastics;  Laterality: Left;  ? PORTACATH PLACEMENT N/A 09/16/2013  ? Procedure: INSERTION PORT-A-CATH;  Surgeon: Rolm Bookbinder, MD;  Location: [place];  Service: General;  Laterality: N/A;  ? REDUCTION MAMMAPLASTY Left   ? REMOVAL OF BILATERAL TISSUE EXPANDERS WITH PLACEMENT OF BILATERAL BREAST IMPLANTS Right 01/14/2014  ? Procedure: REMOVAL OF RIGHT TISSUE EXPANDERS WITH PLACEMENT OF PERMANENT BREAST IMPLANT;  Surgeon: Irene Limbo, MD;  Location: [place];  Service: Plastics;  Laterality: Right;  ? TUBAL LIGATION    ? VAGINAL HYSTERECTOMY    ? ? ?Family History  ?Problem Relation Age of Onset  ? Hypertension Mother   ? Diabetes type II Mother   ? CVA Mother   ? Heart disease Mother   ? Osteoarthritis Mother   ? Depression Mother   ? Other Mother   ?     degenerative disc disease  ? Alcoholism Father   ? Pneumonia Father   ? Colon cancer Maternal Aunt   ?     age 89s  ? Colon cancer Other   ?     maternal great aunt  ? Diabetes Sister   ? Hyperlipidemia Sister   ? Hypertension Sister   ? Cancer Brother   ? Diabetes Sister   ? Hypertension Sister   ? Diabetes Sister   ? Hypertension Sister   ? Diabetes Sister   ? Kidney disease Sister   ? Colon polyps Neg Hx   ? ?Social History  ? ?Tobacco Use  ? Smoking status: Former  ?  Packs/day: 0.25  ?  Years: 2.00  ?  Pack years: 0.50  ?  Types: Cigarettes  ?  Quit date: 09/14/1995  ?  Years since quitting: 26.2  ? Smokeless tobacco: Never  ?Vaping Use  ? Vaping Use: Never used  ?Substance Use Topics  ? Alcohol use: Not Currently  ?  Comment: maybe 3 beers a year  ? Drug use: No  ?  Types: Marijuana  ?  Comment: in the past  ? ? ?Allergies  ?Allergen Reactions  ? Codeine Nausea And Vomiting  ? Crestor [Rosuvastatin Calcium]   ?  Muscle pain  ? Lipitor [Atorvastatin]   ?  Muscle pain  ? Namenda [Memantine Hcl]   ?  Nightmares   ? Peanut-Containing Drug Products   ?   Peanut butter triggers asthma   ? Multihance [Gadobenate] Nausea Only and Cough  ?  PT HAD INCREASED MUCOUS PRODUCTION AND PHLEGMY COUGH LASTING 10 MINS AFTER INJECTION, PT SENT HOME WITH BENADRYL, PREV NAUSEA AFTER GAD  ? ? ?Current Meds  ?Medication Sig  ? albuterol (VENTOLIN HFA) 108 (90 Base) MCG/ACT inhaler USE 2  PUFFS EVERY 6 HOURS AS NEEDED  ? ALPRAZolam (XANAX) 0.5 MG tablet Take 0.5-1 tablets (0.25-0.5 mg total) by mouth 2 (two) times daily as needed for anxiety.  ? ARTIFICIAL TEAR OP Place 1 drop into both eyes daily as needed (dry eyes).  ? aspirin EC 81 MG tablet Take 81 mg by mouth every other day.  ? budesonide-formoterol (SYMBICORT) 160-4.5 MCG/ACT inhaler Inhale 1 puff into the lungs 2 (two) times daily.  ? Calcium-Magnesium-Zinc 500-250-12.5 MG TABS Take 1 tablet by mouth daily.   ? Cholecalciferol (VITAMIN D-3) 5000 UNITS TABS Take 10,000 Units by mouth daily.   ? citalopram (CELEXA) 40 MG tablet Take 1 tablet (40 mg total) by mouth daily.  ? clindamycin (CLEOCIN) 150 MG capsule Take by mouth 3 (three) times daily.  ? Cyanocobalamin (B-12) 2500 MCG SUBL Place 2,500 mcg under the tongue daily.  ? levocetirizine (XYZAL) 5 MG tablet Take 1 tablet (5 mg total) by mouth daily as needed for allergies. For itching  ? magnesium oxide (MAG-OX) 400 MG tablet Take 400 mg by mouth daily.  ? meloxicam (MOBIC) 7.5 MG tablet TAKE 1 TABLET EVERY DAY AS NEEDED FOR PAIN  ? pravastatin (PRAVACHOL) 80 MG tablet TAKE 1 TABLET EVERY DAY  ? ? ? ?MEDICAL DECISION MAKING ? ?A.  ?Encounter Diagnoses  ?Name Primary?  ? Lumbar pain Yes  ? Pain in right hip   ? ? ?B. DATA ANALYSED: ? ? ?IMAGING: ?Interpretation of images: I have personally reviewed the images and my interpretation is obvious abnormalities throughout the spine in the lumbar region multiple areas of degenerative disc disease there is an area of spondylolisthesis L5 on 1 ? ?Mild arthritis in both hips right more than left ? ? ?Orders: No new orders ? ?Outside  records reviewed: No outside records were reviewed ? ? ?C. MANAGEMENT  ? ?We discussed possibility of therapy versus chiropractic treatment she opted for chiropractic treatment we gave her the name of Dr. Bethann Goo

## 2021-12-13 NOTE — Progress Notes (Signed)
n

## 2021-12-19 DIAGNOSIS — I10 Essential (primary) hypertension: Secondary | ICD-10-CM

## 2021-12-19 DIAGNOSIS — F331 Major depressive disorder, recurrent, moderate: Secondary | ICD-10-CM

## 2021-12-19 DIAGNOSIS — E782 Mixed hyperlipidemia: Secondary | ICD-10-CM

## 2022-01-09 ENCOUNTER — Other Ambulatory Visit: Payer: Self-pay | Admitting: Family Medicine

## 2022-01-09 DIAGNOSIS — F419 Anxiety disorder, unspecified: Secondary | ICD-10-CM

## 2022-01-09 DIAGNOSIS — E785 Hyperlipidemia, unspecified: Secondary | ICD-10-CM

## 2022-01-10 ENCOUNTER — Other Ambulatory Visit: Payer: Self-pay | Admitting: Family Medicine

## 2022-01-10 DIAGNOSIS — F419 Anxiety disorder, unspecified: Secondary | ICD-10-CM

## 2022-01-18 ENCOUNTER — Telehealth: Payer: Medicare HMO

## 2022-01-19 ENCOUNTER — Ambulatory Visit: Payer: Medicare HMO | Admitting: Cardiology

## 2022-01-19 ENCOUNTER — Encounter: Payer: Self-pay | Admitting: *Deleted

## 2022-01-19 ENCOUNTER — Encounter: Payer: Self-pay | Admitting: Cardiology

## 2022-01-19 VITALS — BP 130/64 | HR 64 | Ht 69.5 in | Wt 255.0 lb

## 2022-01-19 DIAGNOSIS — E7800 Pure hypercholesterolemia, unspecified: Secondary | ICD-10-CM | POA: Diagnosis not present

## 2022-01-19 DIAGNOSIS — R0609 Other forms of dyspnea: Secondary | ICD-10-CM

## 2022-01-19 DIAGNOSIS — R030 Elevated blood-pressure reading, without diagnosis of hypertension: Secondary | ICD-10-CM | POA: Diagnosis not present

## 2022-01-19 DIAGNOSIS — I493 Ventricular premature depolarization: Secondary | ICD-10-CM

## 2022-01-19 DIAGNOSIS — I491 Atrial premature depolarization: Secondary | ICD-10-CM

## 2022-01-19 NOTE — Addendum Note (Signed)
Addended by: Levonne Hubert on: 01/19/2022 11:33 AM   Modules accepted: Orders

## 2022-01-19 NOTE — Patient Instructions (Signed)
Medication Instructions:  Your physician recommends that you continue on your current medications as directed. Please refer to the Current Medication list given to you today.  *If you need a refill on your cardiac medications before your next appointment, please call your pharmacy*   Lab Work: Your physician recommends that you return for lab work in: Today   If you have labs (blood work) drawn today and your tests are completely normal, you will receive your results only by: MyChart Message (if you have MyChart) OR A paper copy in the mail If you have any lab test that is abnormal or we need to change your treatment, we will call you to review the results.   Testing/Procedures: Your physician has requested that you have a lexiscan myoview. For further information please visit [email or web address]. Please follow instruction sheet, as given.    Follow-Up: At [facility], you and your health needs are our priority.  As part of our continuing mission to provide you with exceptional heart care, we have created designated Provider Care Teams.  These Care Teams include your primary Cardiologist (physician) and Advanced Practice Providers (APPs -  Physician Assistants and Nurse Practitioners) who all work together to provide you with the care you need, when you need it.  We recommend signing up for the patient portal called "MyChart".  Sign up information is provided on this After Visit Summary.  MyChart is used to connect with patients for Virtual Visits (Telemedicine).  Patients are able to view lab/test results, encounter notes, upcoming appointments, etc.  Non-urgent messages can be sent to your provider as well.   To learn more about what you can do with MyChart, go to [email or web address].    Your next appointment:    As Needed   The format for your next appointment:   In Person  Provider:   Fransico Him, MD      Other Instructions Thank you for choosing [facility]!    Important Information About Sugar

## 2022-01-19 NOTE — Progress Notes (Addendum)
Cardiology Office Note  Date: 01/19/2022   ID: [name], [name] 01-28-1951, MRN [id number]  PCP:  Antony Blackbird, MD  Cardiologist:  Rozann Lesches, MD Electrophysiologist:  None   Chief Complaint: Follow-up palpitations   History of Present Illness: Morgan Santos is a 71 y.o. 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  Echo 2021 showed grade 1 diastolic dysfunction with EF of 50 to 55%.  Event monitor in 2021 showed PACs and PVCs.  Repeat echo November 30, 2021 showed normal LV function with EF 55 to 65%, mild LVH, grade 2 diastolic dysfunction.  EKG done November 30, 2021 showed sinus rhythm right bundle branch block and left anterior fascicular block with PACs and LVH by voltage criteria  She is here today for followup and is doing well.  She denies any chest pain or pressure, SOB, DOE, PND, orthopnea, dizziness, palpitations or syncope. Occasionally she will have some mild LE edema when she eats too much salt or stands for too long. She is compliant with her meds and is tolerating meds with no SE.     Past Medical History:  Diagnosis Date   Anemia    when she was younger   Anxiety    Arthritis    hip- R, back; osteoarthritis   Asthma    Breast cancer ([place])    right   Complication of anesthesia    Constipation    Depression    pt. remarks that she "takes respridal because if I don't take it I get angry"    GERD (gastroesophageal reflux disease)    pt. uses vinegar for heartburn, ususally once per week    H/O degenerative disc disease    Hyperlipidemia    Hypertension    "when I get upset"  Not on medication   Memory loss    PONV (postoperative nausea and vomiting)    "just for one day"   Stroke [facility]) ?2010 or 2011   2010, [facility] for stroke "mini stroke" short term memory problems since    Past Surgical History:  Procedure Laterality  Date   BREAST RECONSTRUCTION WITH PLACEMENT OF TISSUE EXPANDER AND FLEX HD (ACELLULAR HYDRATED DERMIS) Right 07/12/2013   Procedure: RIGHT BREAST RECONSTRUCTION WITH PLACEMENT OF TISSUE EXPANDER AND FLEX HD TO RIGHT BREAST (ACELLULAR HYDRATED DERMIS);  Surgeon: Irene Limbo, MD;  Location: [place];  Service: Plastics;  Laterality: Right;   BREAST SURGERY     for abcesses- removed from both breasts, many yrs. ago   CHOLECYSTECTOMY     COLONOSCOPY   01/29/2004   NUR:A 7-8 mm polyp snared from the hepatic flexure.  Another 3 mm polyp was     cold snared from the sigmoid colon.External hemorrhoids, possible source of recent rectal bleeding   COLONOSCOPY N/A 06/05/2014   Procedure: COLONOSCOPY;  Surgeon: Danie Binder, MD;  [facility];  Service: Endoscopy;  Laterality: N/A;  1030 - moved to 10:45 - Ginger to notify pt    COLONOSCOPY WITH PROPOFOL N/A 10/17/2017   Procedure: COLONOSCOPY WITH PROPOFOL;  Surgeon: Danie Binder, MD;  [facility];  Service: Endoscopy;  Laterality: N/A;  10:30am   DILATION AND CURETTAGE OF UTERUS     LUMBAR SPINE SURGERY     MASTECTOMY Right    MASTECTOMY W/ SENTINEL NODE BIOPSY Right 07/12/2013   Procedure: MASTECTOMY WITH SENTINEL LYMPH NODE BIOPSY;  Surgeon: Rolm Bookbinder, MD;  Location: [employer];  Service: General;  Laterality: Right;   MASTOPEXY Left 01/14/2014   Procedure: LEFT BREAST MASTOPEXY FOR SYMMETRY ;  Surgeon: Irene Limbo, MD;  Location: [place];  Service: Plastics;  Laterality: Left;   PORT-A-CATH REMOVAL Left 01/14/2014   Procedure: REMOVAL PORT-A-CATH;  Surgeon: Irene Limbo, MD;  Location: [place];  Service: Plastics;  Laterality: Left;   PORTACATH PLACEMENT N/A 09/16/2013   Procedure: INSERTION PORT-A-CATH;  Surgeon: Rolm Bookbinder, MD;  Location: [place];  Service: General;  Laterality: N/A;   REDUCTION MAMMAPLASTY Left    REMOVAL OF BILATERAL TISSUE EXPANDERS WITH PLACEMENT  OF BILATERAL BREAST IMPLANTS Right 01/14/2014   Procedure: REMOVAL OF RIGHT TISSUE EXPANDERS WITH PLACEMENT OF PERMANENT BREAST IMPLANT;  Surgeon: Irene Limbo, MD;  Location: [place];  Service: Plastics;  Laterality: Right;   TUBAL LIGATION     VAGINAL HYSTERECTOMY      Current Outpatient Medications  Medication Sig Dispense Refill   albuterol (VENTOLIN HFA) 108 (90 Base) MCG/ACT inhaler USE 2 PUFFS EVERY 6 HOURS AS NEEDED 8.5 g 1   ALPRAZolam (XANAX) 0.5 MG tablet Take 0.5-1 tablets (0.25-0.5 mg total) by mouth 2 (two) times daily as needed for anxiety. 60 tablet 1   ARTIFICIAL TEAR OP Place 1 drop into both eyes daily as needed (dry eyes).     aspirin EC 81 MG tablet Take 81 mg by mouth every other day.     Calcium-Magnesium-Zinc 500-250-12.5 MG TABS Take 1 tablet by mouth daily.      Cholecalciferol (VITAMIN D-3) 5000 UNITS TABS Take 10,000 Units by mouth daily.      citalopram (CELEXA) 40 MG tablet Take 1 tablet (40 mg total) by mouth daily. 90 tablet 3   Cyanocobalamin (B-12) 2500 MCG SUBL Place 2,500 mcg under the tongue daily.     magnesium oxide (MAG-OX) 400 MG tablet Take 400 mg by mouth daily.     meloxicam (MOBIC) 7.5 MG tablet TAKE 1 TABLET EVERY DAY AS NEEDED FOR PAIN 90 tablet 2   pravastatin (PRAVACHOL) 80 MG tablet TAKE 1 TABLET EVERY DAY 90 tablet 1   budesonide-formoterol (SYMBICORT) 160-4.5 MCG/ACT inhaler Inhale 1 puff into the lungs 2 (two) times daily. (Patient not taking: Reported on 01/19/2022) 1 Inhaler 3   clindamycin (CLEOCIN) 150 MG capsule Take by mouth 3 (three) times daily. (Patient not taking: Reported on 01/19/2022)     levocetirizine (XYZAL) 5 MG tablet Take 1 tablet (5 mg total) by mouth daily as needed for allergies. For itching (Patient not taking: Reported on 01/19/2022) 90 tablet 0   No current facility-administered medications for this visit.   Allergies:  Codeine, Crestor [rosuvastatin calcium], Lipitor [atorvastatin], Namenda  [memantine hcl], Peanut-containing drug products, and Multihance [gadobenate]   Social History: The patient  reports that she quit smoking about 26 years ago. Her smoking use included cigarettes. She has a 0.50 pack-year smoking history. She has never used smokeless tobacco. She reports that she does not currently use alcohol. She reports that she does not use drugs.   Family History: The patient's family history includes Alcoholism in her father; CVA in her mother; Cancer in her brother; Colon cancer in her maternal aunt and another family member; Depression in her mother; Diabetes in her sister, sister, sister, and sister; Diabetes type II in her mother; Heart disease in her mother; Hyperlipidemia in her sister; Hypertension in her mother, sister, sister, and sister; Kidney  disease in her sister; Osteoarthritis in her mother; Other in her mother; Pneumonia in her father.   ROS:  Please see the history of present illness. Otherwise, complete review of systems is positive for none.  All other systems are reviewed and negative.   Physical Exam: VS:  BP 130/64   Pulse 64   Ht 5' 9.5" (1.765 m)   Wt 255 lb (115.7 kg)   SpO2 97%   BMI 37.12 kg/m , BMI Body mass index is 37.12 kg/m.  Wt Readings from Last 3 Encounters:  01/19/22 255 lb (115.7 kg)  12/13/21 254 lb 12.8 oz (115.6 kg)  10/22/21 256 lb (116.1 kg)    GEN: Well nourished, well developed in no acute distress HEENT: Normal NECK: No JVD; No carotid bruits LYMPHATICS: No lymphadenopathy CARDIAC:RRR, no murmurs, rubs, gallops RESPIRATORY:  Clear to auscultation without rales, wheezing or rhonchi  ABDOMEN: Soft, non-tender, non-distended MUSCULOSKELETAL:  No edema; No deformity  SKIN: Warm and dry NEUROLOGIC:  Alert and oriented x 3 PSYCHIATRIC:  Normal affect    ECG:  not performed today  Recent Labwork: 04/09/2021: ALT 38; AST 44; BUN 11; Creatinine, Ser 0.73; Hemoglobin 13.3; Platelets 213; Potassium 3.9; Sodium 139; TSH 2.080      Component Value Date/Time   CHOL 202 (H) 09/24/2019 1252   TRIG 106 09/24/2019 1252   HDL 58 09/24/2019 1252   CHOLHDL 3.5 09/24/2019 1252   LDLCALC 125 (H) 09/24/2019 1252   LDLDIRECT 101 (H) 04/09/2021 1555    Other Studies Reviewed Today:  Echocardiogram 03/19/2020  1. Left ventricular ejection fraction, by estimation, is 50 to 55%. The left ventricle has low normal function. The left ventricle has no regional wall motion abnormalities. Left ventricular diastolic parameters are consistent with Grade I diastolic dysfunction (impaired relaxation). 2. Right ventricular systolic function is normal. The right ventricular size is normal. There is mildly elevated pulmonary artery systolic pressure. 3. The mitral valve is normal in structure. Trivial mitral valve regurgitation. No evidence of mitral stenosis. 4. The aortic valve is tricuspid. Aortic valve regurgitation is not visualized. No aortic stenosis is present. 5. The inferior vena cava is normal in size with greater than 50% respiratory variability, suggesting right atrial pressure of 3 mmHg. Comparison(s): Echocardiogram done 08/16/18 showed an EF of 50-55%.    Echocardiogram 08/16/2018: Echo was ordered due to CVA  - Left ventricle: The cavity size was normal. Systolic function was    normal. The estimated ejection fraction was in the range of 50%    to 55%. Wall motion was normal; there were no regional wall    motion abnormalities. Doppler parameters are consistent with    abnormal left ventricular relaxation (grade 1 diastolic    dysfunction).  - Aortic valve: Transvalvular velocity was within the normal range.    There was no stenosis. There was no regurgitation.  - Mitral valve: Transvalvular velocity was within the normal range.    There was no evidence for stenosis. There was trivial    regurgitation.  - Left atrium: The atrium was normal in size.  - Right ventricle: The cavity size was normal. Wall thickness  was    normal. Systolic function was normal. RV systolic pressure (S,    est): 32 mm Hg.  - Right atrium: The atrium was normal in size.  - Atrial septum: No defect or patent foramen ovale was identified.  - Tricuspid valve: There was trivial regurgitation.  - Pulmonary arteries: Systolic pressure was mildly increased. PA  peak pressure: 32 mm Hg (S).  - Inferior vena cava: The vessel was normal in size. The    respirophasic diameter changes were in the normal range (>= 50%),    consistent with normal central venous pressure.  - Pericardium, extracardiac: There was no pericardial effusion.   Impressions:   - No cardiac source of emboli was identified  Assessment and Plan:   1.  PVCs/PACs -initially noted on event monitor in 2021  -Palpitations have resolved since decreasing caffeine intake  2. Elevated blood-pressure reading, without diagnosis of hypertension -BP controlled on exam today  -Continue low-sodium diet  3. Mixed hyperlipidemia  -LDL goal less than 100  -Repeat FLP and ALT  -Continue prescription drug management with atorvastatin 80 mg daily with as needed refills  4.  Chronic dyspnea on exertion/shortness of breath -2D echo 2021 essentially unchanged from previous echo.  EF of 50 to 55%,  Grade 1 DD, trivial MR.   -Shortness of breath resolved improved after starting exercise program   5.  New right bundle branch block with left anterior fascicular block -In 2015 she had a left anterior fascicular block and right bundle branch block is now new -She has no anginal symptoms -Recommend Lexiscan Myoview to rule out ischemia given new right bundle branch block -Shared Decision Making/Informed Consent The risks [chest pain, shortness of breath, cardiac arrhythmias, dizziness, blood pressure fluctuations, myocardial infarction, stroke/transient ischemic attack, nausea, vomiting, allergic reaction, radiation exposure, metallic taste sensation and life-threatening  complications (estimated to be 1 in 10,000)], benefits (risk stratification, diagnosing coronary artery disease, treatment guidance) and alternatives of a nuclear stress test were discussed in detail with Morgan Santos and she agrees to proceed.   Medication Adjustments/Labs and Tests Ordered: Current medicines are reviewed at length with the patient today.  Concerns regarding medicines are outlined above.   Disposition: As needed follow-up with nuclear stress test is normal  Signed, Levell July, NP 01/19/2022 11:12 AM    [address] [place], [place], [place] [address] Phone: [phone]; Fax: [phone]

## 2022-01-20 ENCOUNTER — Telehealth: Payer: Self-pay | Admitting: *Deleted

## 2022-01-20 ENCOUNTER — Other Ambulatory Visit (HOSPITAL_COMMUNITY)
Admission: RE | Admit: 2022-01-20 | Discharge: 2022-01-20 | Disposition: A | Discharge status: Home / Self Care | Payer: Medicare HMO | Source: Ambulatory Visit | Attending: Cardiology | Admitting: Cardiology

## 2022-01-20 DIAGNOSIS — E782 Mixed hyperlipidemia: Secondary | ICD-10-CM

## 2022-01-20 DIAGNOSIS — E7800 Pure hypercholesterolemia, unspecified: Secondary | ICD-10-CM | POA: Diagnosis present

## 2022-01-20 LAB — ALT: ALT: 23 U/L (ref 0–44)

## 2022-01-20 LAB — LIPID PANEL
Cholesterol: 187 mg/dL (ref 0–200)
HDL: 50 mg/dL (ref >40)
LDL Cholesterol: 118 mg/dL — ABNORMAL HIGH (ref 0–99)
Total CHOL/HDL Ratio: 3.7 RATIO
Triglycerides: 93 mg/dL (ref <150)
VLDL: 19 mg/dL (ref 0–40)

## 2022-01-20 MED ORDER — EZETIMIBE 10 MG PO TABS: 10.0000 mg | ORAL_TABLET | Freq: Every day | ORAL | 3 refills | Status: DC

## 2022-01-20 NOTE — Telephone Encounter (Signed)
-----   Message from Sueanne Margarita, MD sent at 01/20/2022  9:59 AM EDT ----- LDL is not at goal please add Zetia 10 mg daily and repeat FLP and ALT in 8 weeks

## 2022-01-20 NOTE — Telephone Encounter (Signed)
Pt notified an agrees with plan of care.

## 2022-02-02 ENCOUNTER — Encounter (HOSPITAL_COMMUNITY): Admission: RE | Admit: 2022-02-02 | Payer: Medicare HMO | Source: Ambulatory Visit

## 2022-02-02 ENCOUNTER — Encounter (HOSPITAL_COMMUNITY): Payer: Medicare HMO

## 2022-02-10 ENCOUNTER — Encounter (HOSPITAL_BASED_OUTPATIENT_CLINIC_OR_DEPARTMENT_OTHER)
Admission: RE | Admit: 2022-02-10 | Discharge: 2022-02-10 | Disposition: A | Discharge status: Home / Self Care | Payer: Medicare HMO | Source: Ambulatory Visit | Attending: Cardiology | Admitting: Cardiology

## 2022-02-10 ENCOUNTER — Ambulatory Visit (HOSPITAL_COMMUNITY)
Admission: RE | Admit: 2022-02-10 | Discharge: 2022-02-10 | Disposition: A | Discharge status: Home / Self Care | Payer: Medicare HMO | Source: Ambulatory Visit | Attending: Cardiology | Admitting: Cardiology

## 2022-02-10 DIAGNOSIS — I252 Old myocardial infarction: Secondary | ICD-10-CM | POA: Insufficient documentation

## 2022-02-10 DIAGNOSIS — I4510 Unspecified right bundle-branch block: Secondary | ICD-10-CM | POA: Insufficient documentation

## 2022-02-10 DIAGNOSIS — I491 Atrial premature depolarization: Secondary | ICD-10-CM | POA: Insufficient documentation

## 2022-02-10 DIAGNOSIS — R001 Bradycardia, unspecified: Secondary | ICD-10-CM | POA: Diagnosis not present

## 2022-02-10 DIAGNOSIS — R0609 Other forms of dyspnea: Secondary | ICD-10-CM

## 2022-02-10 LAB — NM MYOCAR MULTI W/SPECT W/WALL MOTION / EF
LV dias vol: 152 mL (ref 46–106)
LV sys vol: 93 mL
Nuc Stress EF: 39 %
Peak HR: 75 {beats}/min
RATE: 0.4
Rest HR: 55 {beats}/min
Rest Nuclear Isotope Dose: 10 mCi
SDS: 3
SRS: 11
SSS: 14
ST Depression (mm): 0 mm
Stress Nuclear Isotope Dose: 31 mCi
TID: 1.07

## 2022-02-10 MED ORDER — REGADENOSON 0.4 MG/5ML IV SOLN
INTRAVENOUS | Status: AC
  Administered 2022-02-10: 0.4 mg via INTRAVENOUS
  Filled 2022-02-10: qty 5

## 2022-02-10 MED ORDER — SODIUM CHLORIDE FLUSH 0.9 % IV SOLN
INTRAVENOUS | Status: AC
  Administered 2022-02-10: 10 mL via INTRAVENOUS
  Filled 2022-02-10: qty 10

## 2022-02-10 MED ORDER — TECHNETIUM TC 99M TETROFOSMIN IV KIT
30.0000 | PACK | Freq: Once | INTRAVENOUS | Status: AC | PRN
  Administered 2022-02-10: 31 via INTRAVENOUS

## 2022-02-10 MED ORDER — TECHNETIUM TC 99M TETROFOSMIN IV KIT
10.0000 | PACK | Freq: Once | INTRAVENOUS | Status: AC | PRN
  Administered 2022-02-10: 10 via INTRAVENOUS

## 2022-02-14 ENCOUNTER — Telehealth: Payer: Self-pay

## 2022-02-14 DIAGNOSIS — R9439 Abnormal result of other cardiovascular function study: Secondary | ICD-10-CM

## 2022-02-14 MED ORDER — METOPROLOL TARTRATE 50 MG PO TABS: ORAL_TABLET | ORAL | 0 refills | Status: DC

## 2022-03-09 ENCOUNTER — Telehealth (HOSPITAL_COMMUNITY): Payer: Self-pay | Admitting: *Deleted

## 2022-03-09 NOTE — Telephone Encounter (Signed)
Attempted to call patient regarding upcoming cardiac CT appointment. °Left message on voicemail with name and callback number ° °Prerana Strayer RN Navigator Cardiac Imaging °[facility] °[phone] Office °[phone] Cell ° °

## 2022-03-10 ENCOUNTER — Encounter: Payer: Self-pay | Admitting: Cardiology

## 2022-03-10 ENCOUNTER — Ambulatory Visit (HOSPITAL_COMMUNITY)
Admission: RE | Admit: 2022-03-10 | Discharge: 2022-03-10 | Disposition: A | Discharge status: Home / Self Care | Payer: Medicare HMO | Source: Ambulatory Visit | Attending: Cardiology | Admitting: Cardiology

## 2022-03-10 DIAGNOSIS — I700 Atherosclerosis of aorta: Secondary | ICD-10-CM | POA: Diagnosis not present

## 2022-03-10 DIAGNOSIS — R9439 Abnormal result of other cardiovascular function study: Secondary | ICD-10-CM | POA: Insufficient documentation

## 2022-03-10 DIAGNOSIS — I2510 Atherosclerotic heart disease of native coronary artery without angina pectoris: Secondary | ICD-10-CM | POA: Diagnosis not present

## 2022-03-10 MED ORDER — IOHEXOL 350 MG/ML SOLN
100.0000 mL | Freq: Once | INTRAVENOUS | Status: AC | PRN
  Administered 2022-03-10: 100 mL via INTRAVENOUS

## 2022-03-10 MED ORDER — NITROGLYCERIN 0.4 MG SL SUBL
0.8000 mg | SUBLINGUAL_TABLET | Freq: Once | SUBLINGUAL | Status: AC
  Administered 2022-03-10: 0.8 mg via SUBLINGUAL

## 2022-03-10 MED ORDER — NITROGLYCERIN 0.4 MG SL SUBL
SUBLINGUAL_TABLET | SUBLINGUAL | Status: AC
  Filled 2022-03-10: qty 2

## 2022-03-14 ENCOUNTER — Telehealth: Payer: Self-pay | Admitting: *Deleted

## 2022-03-14 DIAGNOSIS — E7800 Pure hypercholesterolemia, unspecified: Secondary | ICD-10-CM

## 2022-03-14 NOTE — Telephone Encounter (Signed)
Pt notified and order placed 

## 2022-03-15 ENCOUNTER — Telehealth: Payer: Self-pay

## 2022-03-15 ENCOUNTER — Other Ambulatory Visit (HOSPITAL_COMMUNITY)
Admission: RE | Admit: 2022-03-15 | Discharge: 2022-03-15 | Disposition: A | Discharge status: Home / Self Care | Payer: Medicare HMO | Source: Ambulatory Visit | Attending: Cardiology | Admitting: Cardiology

## 2022-03-15 DIAGNOSIS — R9439 Abnormal result of other cardiovascular function study: Secondary | ICD-10-CM | POA: Insufficient documentation

## 2022-03-15 DIAGNOSIS — E7800 Pure hypercholesterolemia, unspecified: Secondary | ICD-10-CM | POA: Diagnosis present

## 2022-03-15 LAB — LIPID PANEL
Cholesterol: 206 mg/dL — ABNORMAL HIGH (ref 0–200)
HDL: 48 mg/dL (ref >40)
LDL Cholesterol: 129 mg/dL — ABNORMAL HIGH (ref 0–99)
Total CHOL/HDL Ratio: 4.3 RATIO
Triglycerides: 145 mg/dL (ref <150)
VLDL: 29 mg/dL (ref 0–40)

## 2022-03-15 LAB — BASIC METABOLIC PANEL
Anion gap: 3 — ABNORMAL LOW (ref 5–15)
BUN: 16 mg/dL (ref 8–23)
CO2: 27 mmol/L (ref 22–32)
Calcium: 8.9 mg/dL (ref 8.9–10.3)
Chloride: 107 mmol/L (ref 98–111)
Creatinine, Ser: 0.72 mg/dL (ref 0.44–1.00)
GFR, Estimated: >60 mL/min (ref >60)
Glucose, Bld: 100 mg/dL — ABNORMAL HIGH (ref 70–99)
Potassium: 4.1 mmol/L (ref 3.5–5.1)
Sodium: 137 mmol/L (ref 135–145)

## 2022-03-15 LAB — ALT: ALT: 26 U/L (ref 0–44)

## 2022-03-15 NOTE — Telephone Encounter (Signed)
-----   Message from Rollen Sox, [facility] sent at 03/15/2022 11:43 AM EDT ----- Has intolerance to atorvastatin and rosuvastatin and LDL is increasing on high dose pravastatin. Recommend referral to lipid clinic for consideration of PCSK9i  ----- Message ----- From: Antonieta Iba, RN Sent: 03/15/2022  11:39 AM EDT To: Cv Div Pharmd  Dr. Radford Pax would like recommendations from PharmD

## 2022-03-15 NOTE — Telephone Encounter (Signed)
Left message for patient to call back  

## 2022-03-17 ENCOUNTER — Ambulatory Visit: Payer: Self-pay | Admitting: *Deleted

## 2022-03-17 NOTE — Telephone Encounter (Signed)
Follow Up:       Patient is returning [place] call from 03-15-22, concerning her lab results.

## 2022-03-17 NOTE — Telephone Encounter (Signed)
Patient understands results and referral is placed at this time.

## 2022-03-17 NOTE — Chronic Care Management (AMB) (Signed)
  Chronic Care Management   Note  03/17/2022 Name: Morgan Santos MRN: [id number] DOB: [birth year]   Patient has not engaged with RN Care Manager and has a PCP other than a provider at [facility] listed. I am removing RN Care Manager from Care Team and closing [place]. Patient has an open care plan with another CCM team member, Theadore Nan, LCSW. If they are engaged with another CCM Team Member, I will forward this case closure encounter to them. Patient does not have a current CCM referral placed since 12/20/21. CCM enrollment status was not accurate and was changed to "not enrolled".  Their PCP can place a new referral if the patient needs Care Management or Care Coordination services in the future.  Chong Sicilian, BSN, RN-BC Embedded Chronic Care Manager [facility] Direct Dial: [phone]

## 2022-03-23 ENCOUNTER — Other Ambulatory Visit: Payer: Self-pay | Admitting: Family Medicine

## 2022-04-13 ENCOUNTER — Ambulatory Visit: Payer: Medicare HMO

## 2022-05-12 ENCOUNTER — Ambulatory Visit: Payer: Medicare HMO

## 2022-05-12 NOTE — Progress Notes (Deleted)
Patient ID: Morgan Santos                 DOB: 06-07-1951                    MRN: [id number]      HPI: Morgan Santos is a 71 y.o. female patient referred to lipid clinic by ***. 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  Echo 2021 showed grade 1 diastolic dysfunction with EF of 50 to 55%.  Event monitor in 2021 showed PACs and PVCs.  Repeat echo November 30, 2021 showed normal LV function with EF 55 to 65%, mild LVH, grade 2 diastolic dysfunction.   Current Medications: pravastatin '80mg'$ , ezetimibe '10mg'$  daily Intolerances: atorvastatin and rosuvastatin  Risk Factors:  LDL goal:   Diet:   Exercise:   Family History:   Social History:   Labs:  Past Medical History:  Diagnosis Date   Anemia    when she was younger   Anxiety    Arthritis    hip- R, back; osteoarthritis   Asthma    Breast cancer ([place])    right   CAD (coronary artery disease), native coronary artery    25-49% LCx by CTA 01/2375   Complication of anesthesia    Constipation    Depression    pt. remarks that she "takes respridal because if I don't take it I get angry"    GERD (gastroesophageal reflux disease)    pt. uses vinegar for heartburn, ususally once per week    H/O degenerative disc disease    Hyperlipidemia    Hypertension    "when I get upset"  Not on medication   Memory loss    PONV (postoperative nausea and vomiting)    "just for one day"   Stroke [facility]) ?2010 or 2011   2010, [facility] for stroke "mini stroke" short term memory problems since    Current Outpatient Medications on File Prior to Visit  Medication Sig Dispense Refill   albuterol (VENTOLIN HFA) 108 (90 Base) MCG/ACT inhaler USE 2 PUFFS EVERY 6 HOURS AS NEEDED 8.5 g 1   ALPRAZolam (XANAX) 0.5 MG tablet Take 0.5-1 tablets (0.25-0.5 mg total) by mouth 2 (two) times daily as needed for anxiety. 60  tablet 1   ARTIFICIAL TEAR OP Place 1 drop into both eyes daily as needed (dry eyes).     aspirin EC 81 MG tablet Take 81 mg by mouth every other day.     budesonide-formoterol (SYMBICORT) 160-4.5 MCG/ACT inhaler Inhale 1 puff into the lungs 2 (two) times daily. (Patient not taking: Reported on 01/19/2022) 1 Inhaler 3   Calcium-Magnesium-Zinc 500-250-12.5 MG TABS Take 1 tablet by mouth daily.      Cholecalciferol (VITAMIN D-3) 5000 UNITS TABS Take 10,000 Units by mouth daily.      citalopram (CELEXA) 40 MG tablet Take 1 tablet (40 mg total) by mouth daily. 90 tablet 3   clindamycin (CLEOCIN) 150 MG capsule Take by mouth 3 (three) times daily. (Patient not taking: Reported on 01/19/2022)     Cyanocobalamin (B-12) 2500 MCG SUBL Place 2,500 mcg under the tongue daily.     ezetimibe (ZETIA) 10 MG tablet Take 1 tablet (10 mg total) by mouth daily. 90 tablet 3   levocetirizine (XYZAL) 5 MG tablet Take 1 tablet (5 mg total) by mouth daily as needed for allergies. For itching (  Patient not taking: Reported on 01/19/2022) 90 tablet 0   magnesium oxide (MAG-OX) 400 MG tablet Take 400 mg by mouth daily.     meloxicam (MOBIC) 7.5 MG tablet TAKE 1 TABLET EVERY DAY AS NEEDED FOR PAIN 90 tablet 2   metoprolol tartrate (LOPRESSOR) 50 MG tablet Take 1 tablet 2 hours prior to CT scan 1 tablet 0   pravastatin (PRAVACHOL) 80 MG tablet TAKE 1 TABLET EVERY DAY 90 tablet 1   No current facility-administered medications on file prior to visit.    Allergies  Allergen Reactions   Codeine Nausea And Vomiting   Crestor [Rosuvastatin Calcium]     Muscle pain   Lipitor [Atorvastatin]     Muscle pain   Namenda [Memantine Hcl]     Nightmares    Peanut-Containing Drug Products     Peanut butter triggers asthma    Multihance [Gadobenate] Nausea Only and Cough    PT HAD INCREASED MUCOUS PRODUCTION AND PHLEGMY COUGH LASTING 10 MINS AFTER INJECTION, PT SENT HOME WITH BENADRYL, PREV NAUSEA AFTER GAD     Assessment/Plan:  1. Hyperlipidemia -    Thank you,   Ramond Dial, Pharm.D, BCPS, CPP  [facility] [address], [place], [place] [address]  Phone: [phone]; Fax: [phone]

## 2022-05-19 ENCOUNTER — Ambulatory Visit: Payer: Medicare HMO | Attending: Cardiology | Admitting: Pharmacist

## 2022-05-19 DIAGNOSIS — E7800 Pure hypercholesterolemia, unspecified: Secondary | ICD-10-CM | POA: Diagnosis not present

## 2022-05-19 MED ORDER — EZETIMIBE 10 MG PO TABS: 10.0000 mg | ORAL_TABLET | Freq: Every day | ORAL | 3 refills | Status: DC

## 2022-05-19 NOTE — Progress Notes (Signed)
Patient ID: Morgan Santos                 DOB: February 22, 1951                    MRN: [id number]      HPI: Morgan Santos is a 71 y.o. female patient referred to lipid clinic by Dr. Radford Pax. 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  Echo 2021 showed grade 1 diastolic dysfunction with EF of 50 to 55%.  Event monitor in 2021 showed PACs and PVCs.  Repeat echo November 30, 2021 showed normal LV function with EF 55 to 65%, mild LVH, grade 2 diastolic dysfunction.   Patient presents today to lipid clinic. Patient with leg pains to atorvastatin and rosuvastatin. Tolerates pravastatin fine. She was also started on ezetimibe, but it turns out that she only took it for 1 month and never refilled it. No adverse effects, just says no on refilled it. Thought maybe it was just a trial. She eats out a lot. Says its fast and easy. Doesn't have the motivation to exercise. Takes care of her husband who has Parkinson's. Cooks with butter.  Current Medications: pravastatin '80mg'$  Intolerances: atorvastatin and rosuvastatin (leg pain) Risk Factors: CVA LDL goal: <70  Diet:  Breakfast: eggs, bacon, sausage Lunch: potatoes, fried chicken Dinner: fried fish twice a week, eats out a lot Drink: water, green tea, sweet tea, soda  Exercise: none  Family History:  Family History  Problem Relation Age of Onset   Hypertension Mother    Diabetes type II Mother    CVA Mother    Heart disease Mother    Osteoarthritis Mother    Depression Mother    Other Mother        degenerative disc disease   Alcoholism Father    Pneumonia Father    Colon cancer Maternal Aunt        age 61s   Colon cancer Other        maternal great aunt   Diabetes Sister    Hyperlipidemia Sister    Hypertension Sister    Cancer Brother    Diabetes Sister    Hypertension Sister    Diabetes Sister     Hypertension Sister    Diabetes Sister    Kidney disease Sister    Colon polyps Neg Hx      Social History:  Social History   Socioeconomic History   Marital status: Married    Spouse name: [name]   Number of children: 4   Years of education: 12th   Highest education level: High school graduate  Occupational History   Occupation: retired 2014    Comment: Charity fundraiser  Tobacco Use   Smoking status: Former    Packs/day: 0.25    Years: 2.00    Total pack years: 0.50    Types: Cigarettes    Quit date: 09/14/1995    Years since quitting: 26.6   Smokeless tobacco: Never  Vaping Use   Vaping Use: Never used  Substance and Sexual Activity   Alcohol use: Not Currently    Comment: maybe 3 beers a year   Drug use: No    Types: Marijuana    Comment: in the past   Sexual activity: Yes  Other Topics Concern   Not on file  Social History Narrative   Lives at home with her husband.  Right-handed.   1 cup coffee per day, occasional soda.   Social Determinants of Health   Financial Resource Strain: Low Risk  (10/22/2021)   Overall Financial Resource Strain (CARDIA)    Difficulty of Paying Living Expenses: Not very hard  Food Insecurity: No Food Insecurity (10/22/2021)   Hunger Vital Sign    Worried About Running Out of Food in the Last Year: Never true    Ran Out of Food in the Last Year: Never true  Transportation Needs: No Transportation Needs (10/22/2021)   PRAPARE - Hydrologist (Medical): No    Lack of Transportation (Non-Medical): No  Physical Activity: Insufficiently Active (10/22/2021)   Exercise Vital Sign    Days of Exercise per Week: 3 days    Minutes of Exercise per Session: 30 min  Stress: Stress Concern Present (11/23/2021)   [place]    Feeling of Stress : Rather much  Social Connections: Socially Integrated (10/22/2021)   Social Connection and Isolation Panel [NHANES]     Frequency of Communication with Friends and Family: More than three times a week    Frequency of Social Gatherings with Friends and Family: More than three times a week    Attends Religious Services: More than 4 times per year    Active Member of [employer] or Organizations: Yes    Attends Music therapist: More than 4 times per year    Marital Status: Married  Human resources officer Violence: Not At Risk (10/22/2021)   Humiliation, Afraid, Rape, and Kick questionnaire    Fear of Current or Ex-Partner: No    Emotionally Abused: No    Physically Abused: No    Sexually Abused: No     Labs: 03/15/22 TC 206, TG 145, HDL 48, LDL-C 129 (pravastatin '80mg'$ )  Past Medical History:  Diagnosis Date   Anemia    when she was younger   Anxiety    Arthritis    hip- R, back; osteoarthritis   Asthma    Breast cancer ([place])    right   CAD (coronary artery disease), native coronary artery    25-49% LCx by CTA 08/6107   Complication of anesthesia    Constipation    Depression    pt. remarks that she "takes respridal because if I don't take it I get angry"    GERD (gastroesophageal reflux disease)    pt. uses vinegar for heartburn, ususally once per week    H/O degenerative disc disease    Hyperlipidemia    Hypertension    "when I get upset"  Not on medication   Memory loss    PONV (postoperative nausea and vomiting)    "just for one day"   Stroke [facility]) ?2010 or 2011   2010, [facility] for stroke "mini stroke" short term memory problems since    Current Outpatient Medications on File Prior to Visit  Medication Sig Dispense Refill   albuterol (VENTOLIN HFA) 108 (90 Base) MCG/ACT inhaler USE 2 PUFFS EVERY 6 HOURS AS NEEDED 8.5 g 1   ALPRAZolam (XANAX) 0.5 MG tablet Take 0.5-1 tablets (0.25-0.5 mg total) by mouth 2 (two) times daily as needed for anxiety. 60 tablet 1   ARTIFICIAL TEAR OP Place 1 drop into both eyes daily as needed (dry eyes).     aspirin EC 81 MG tablet Take 81 mg by  mouth every other day.     budesonide-formoterol (SYMBICORT) 160-4.5 MCG/ACT inhaler  Inhale 1 puff into the lungs 2 (two) times daily. (Patient not taking: Reported on 01/19/2022) 1 Inhaler 3   Calcium-Magnesium-Zinc 500-250-12.5 MG TABS Take 1 tablet by mouth daily.      Cholecalciferol (VITAMIN D-3) 5000 UNITS TABS Take 10,000 Units by mouth daily.      citalopram (CELEXA) 40 MG tablet Take 1 tablet (40 mg total) by mouth daily. 90 tablet 3   clindamycin (CLEOCIN) 150 MG capsule Take by mouth 3 (three) times daily. (Patient not taking: Reported on 01/19/2022)     Cyanocobalamin (B-12) 2500 MCG SUBL Place 2,500 mcg under the tongue daily.     ezetimibe (ZETIA) 10 MG tablet Take 1 tablet (10 mg total) by mouth daily. 90 tablet 3   levocetirizine (XYZAL) 5 MG tablet Take 1 tablet (5 mg total) by mouth daily as needed for allergies. For itching (Patient not taking: Reported on 01/19/2022) 90 tablet 0   magnesium oxide (MAG-OX) 400 MG tablet Take 400 mg by mouth daily.     meloxicam (MOBIC) 7.5 MG tablet TAKE 1 TABLET EVERY DAY AS NEEDED FOR PAIN 90 tablet 2   metoprolol tartrate (LOPRESSOR) 50 MG tablet Take 1 tablet 2 hours prior to CT scan 1 tablet 0   pravastatin (PRAVACHOL) 80 MG tablet TAKE 1 TABLET EVERY DAY 90 tablet 1   No current facility-administered medications on file prior to visit.    Allergies  Allergen Reactions   Codeine Nausea And Vomiting   Crestor [Rosuvastatin Calcium]     Muscle pain   Lipitor [Atorvastatin]     Muscle pain   Namenda [Memantine Hcl]     Nightmares    Peanut-Containing Drug Products     Peanut butter triggers asthma    Multihance [Gadobenate] Nausea Only and Cough    PT HAD INCREASED MUCOUS PRODUCTION AND PHLEGMY COUGH LASTING 10 MINS AFTER INJECTION, PT SENT HOME WITH BENADRYL, PREV NAUSEA AFTER GAD    Assessment/Plan:  1. Hyperlipidemia - LDL--C is above goal of <70. We discussed cholesterol from diet vs body's own production. Reviewed to decrease  saturated fat. Use EV olive oil instead of butter. We did discuss PCKS9i, but at this time would be cost prohibitive. Since her labs in July were not reflective of zetia. Will resume ezetimibe along with pravastatin. I have reinforced that she needs to continue to have both of these refilled and continue to take each month. She will go to [facility] and have labs drawn in Dec. I gave her lab slips. We discussed increasing vegetables in her diet, limiting fried foods and sweet tea. I encouraged her to start walking/exercising.    Thank you,   Ramond Dial, Pharm.D, BCPS, CPP [facility] of [facility] [address], [place], [place] [address]  Phone: [phone]; Fax: [phone]

## 2022-05-19 NOTE — Patient Instructions (Addendum)
Please start taking ezetimibe '10mg'$  daily. Continue taking pravastatin '80mg'$  daily  Please try to increase your vegetable intake Decrease the amount of fried foods and sweet tea  Please call me at [phone] with any questions  Please get labs drawn in Dec- take lab slips with you.    Tips for living a healthier life     Building a Healthy and Balanced Diet Make most of your meal vegetables and fruits -  of your plate. Aim for color and variety, and remember that potatoes don't count as vegetables on the Healthy Eating Plate because of their negative impact on blood sugar.  Go for whole grains -  of your plate. Whole and intact grains--whole wheat, barley, wheat berries, quinoa, oats, brown rice, and foods made with them, such as whole wheat pasta--have a milder effect on blood sugar and insulin than white bread, white rice, and other refined grains.  Protein power -  of your plate. Fish, poultry, beans, and nuts are all healthy, versatile protein sources--they can be mixed into salads, and pair well with vegetables on a plate. Limit red meat, and avoid processed meats such as bacon and sausage.  Healthy plant oils - in moderation. Choose healthy vegetable oils like olive, canola, soy, corn, sunflower, peanut, and others, and avoid partially hydrogenated oils, which contain unhealthy trans fats. Remember that low-fat does not mean "healthy."  Drink water, coffee, or tea. Skip sugary drinks, limit milk and dairy products to one to two servings per day, and limit juice to a small glass per day.  Stay active. The red figure running across the Alpha is a reminder that staying active is also important in weight control.  The main message of the Healthy Eating Plate is to focus on diet quality:  The type of carbohydrate in the diet is more important than the amount of carbohydrate in the diet, because some sources of carbohydrate--like vegetables (other than  potatoes), fruits, whole grains, and beans--are healthier than others. The Healthy Eating Plate also advises consumers to avoid sugary beverages, a major source of calories--usually with little nutritional value--in the American diet. The Healthy Eating Plate encourages consumers to use healthy oils, and it does not set a maximum on the percentage of calories people should get each day from healthy sources of fat. In this way, the Healthy Eating Plate recommends the opposite of the low-fat message promoted for decades by the USDA.  DeskDistributor.no  SUGAR  Sugar is a huge problem in the modern day diet. Sugar is a big contributor to heart disease, diabetes, high triglyceride levels, fatty liver disease and obesity. Sugar is hidden in almost all packaged foods/beverages. Added sugar is extra sugar that is added beyond what is naturally found and has no nutritional benefit for your body. The American Heart Association recommends limiting added sugars to no more than 25g for women and 36 grams for men per day. There are many names for sugar including maltose, sucrose (names ending in "ose"), high fructose corn syrup, molasses, cane sugar, corn sweetener, raw sugar, syrup, honey or fruit juice concentrate.   One of the best ways to limit your added sugars is to stop drinking sweetened beverages such as soda, sweet tea, and fruit juice.  There is 65g of added sugars in one 20oz bottle of Coke! That is equal to 7.5 donuts.   Pay attention and read all nutrition facts labels. Below is an examples of a nutrition facts label. The #1 is showing  you the total sugars where the # 2 is showing you the added sugars. This one serving has almost the max amount of added sugars per day!     20 oz Soda 65g Sugar = 7.5 Glazed Donuts  16oz Energy  Drink 54g Sugar = 6.5 Glazed Donuts  Large Sweet  Tea 38g Sugar = 4 Glazed Donuts  20oz Sports  Drink 34g Sugar  = 3.5 Glazed Donuts  8oz Chocolate Milk 24g Sugar =2.5 Glazed Donuts  8oz Orange  Juice 21g Sugar = 2 Glazed Donuts  1 Juice Box 14g Sugar = 1.5 Glazed Donuts  16oz Water= NO SUGAR!!  EXERCISE  Exercise is good. We've all heard that. In an ideal world, we would all have time and resources to get plenty of it. When you are active, your heart pumps more efficiently and you will feel better.  Multiple studies show that even walking regularly has benefits that include living a longer life. The American Heart Association recommends 150 minutes per week of exercise (30 minutes per day most days of the week). You can do this in any increment you wish. Nine or more 10-minute walks count. So does an hour-long exercise class. Break the time apart into what will work in your life. Some of the best things you can do include walking briskly, jogging, cycling or swimming laps. Not everyone is ready to "exercise." Sometimes we need to start with just getting active. Here are some easy ways to be more active throughout the day:  Take the stairs instead of the elevator  Go for a 10-15 minute walk during your lunch break (find a friend to make it more enjoyable)  When shopping, park at the back of the parking lot  If you take public transportation, get off one stop early and walk the extra distance  Pace around while making phone calls  Check with your doctor if you aren't sure what your limitations may be. Always remember to drink plenty of water when doing any type of exercise. Don't feel like a failure if you're not getting the 90-150 minutes per week. If you started by being a couch potato, then just a 10-minute walk each day is a huge improvement. Start with little victories and work your way up.   HEALTHY EATING TIPS  When looking to improve your eating habits, whether to lose weight, lower blood pressure or just be healthier, it helps to know what a serving size is.   Grains 1 slice of bread,   bagel,  cup pasta or rice  Vegetables 1 cup fresh or raw vegetables,  cup cooked or canned Fruits 1 piece of medium sized fruit,  cup canned,   Meats/Proteins  cup dried       1 oz meat, 1 egg,  cup cooked beans, nuts or seeds  Dairy        Fats Individual yogurt container, 1 cup (8oz)    1 teaspoon margarine/butter or vegetable  milk or milk alternative, 1 slice of cheese          oil; 1 tablespoon mayonnaise or salad dressing                  Plan ahead: make a menu of the meals for a week then create a grocery list to go with that menu. Consider meals that easily stretch into a night of leftovers, such as stews or casseroles. Or consider making two of your favorite meal and put one in the  freezer for another night. Try a night or two each week that is "meatless" or "no cook" such as salads. When you get home from the grocery store wash and prepare your vegetables and fruits. Then when you need them they are ready to go.   Tips for going to the grocery store:  [place] store or generic brands  Check the weekly ad from your store on-line or in their in-store flyer  Look at the unit price on the shelf tag to compare/contrast the costs of different items  Buy fruits/vegetables in season  Carrots, bananas and apples are low-cost, naturally healthy items  If meats or frozen vegetables are on sale, buy some extras and put in your freezer  Limit buying prepared or "ready to eat" items, even if they are pre-made salads or fruit snacks  Do not shop when you're hungry  Foods at eye level tend to be more expensive. Look on the high and low shelves for deals.  Consider shopping at the farmer's market for fresh foods in season.  Avoid the cookie and chip aisles (these are expensive, high in calories and low in nutritional value). Shop on the outside of the grocery store.  Healthy food preparations:  If you can't get lean hamburger, be sure to drain the fat when cooking  Steam, saut (in olive oil),  grill or bake foods  Experiment with different seasonings to avoid adding salt to your foods. Kosher salt, sea salt and Himalayan salt are all still salt and should be avoided. Try seasoning food with onion, garlic, thyme, rosemary, basil ect. Onion powder or garlic powder is ok. Avoid if it says salt (ie garlic salt).

## 2022-08-18 NOTE — Progress Notes (Signed)
Cardiology Office Note:    Date:  08/30/2022   ID:  Morgan Santos, DOB Aug 01, 1951, MRN [id number]  PCP:  Antony Blackbird, MD  [facility] Providers Cardiologist:  Fransico Him, MD     Referring MD: Antony Blackbird, MD   Chief Complaint:  Pre-op Exam and Follow-up     History of Present Illness:   Morgan Santos is a 71 y.o. 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  Echo 2021 showed grade 1 diastolic dysfunction with EF of 50 to 55%.  Event monitor in 2021 showed PACs and PVCs.  Repeat echo November 30, 2021 showed normal LV function with EF 55 to 65%, mild LVH, grade 2 diastolic dysfunction.  EKG done November 30, 2021 showed sinus rhythm right bundle branch block and left anterior fascicular block with PACs and LVH by voltage criteria     Patient saw Dr. Radford Pax 12/2021 with new RBBB and lexi ordered-intermediate irsk EF 39% fixed defect and partially reversible defect. See below for details. Echo 11/2021 normal LVeF 55-65% grade 2 DD. Coronary CTA 02/2022 calcium score 0 mild mixed plaque p Lcfx 25-49%.  Patient comes in for f/u. She has a lot of anxiety and trouble losing weight. No regular exercise. She joined a gym but has to take care of her sick husband with parkinson's and her 62 yr old grand daughter was shot to death in Jun 18, 2023. . No chest pain, dyspnea, palpitations, dizziness, edema. Needs to have breast implant removed.      Past Medical History:  Diagnosis Date   Anemia    when she was younger   Anxiety    Arthritis    hip- R, back; osteoarthritis   Asthma    Breast cancer ([place])    right   CAD (coronary artery disease), native coronary artery    25-49% LCx by CTA 01/3334   Complication of anesthesia    Constipation    Depression    pt. remarks that she "takes respridal because if I don't take it I get angry"    GERD (gastroesophageal reflux  disease)    pt. uses vinegar for heartburn, ususally once per week    H/O degenerative disc disease    Hyperlipidemia    Hypertension    "when I get upset"  Not on medication   Memory loss    PONV (postoperative nausea and vomiting)    "just for one day"   Stroke [facility]) ?2010 or 2011   2010, [facility] for stroke "mini stroke" short term memory problems since   Current Medications: Current Meds  Medication Sig   albuterol (VENTOLIN HFA) 108 (90 Base) MCG/ACT inhaler USE 2 PUFFS EVERY 6 HOURS AS NEEDED   ALPRAZolam (XANAX) 0.5 MG tablet Take 0.5-1 tablets (0.25-0.5 mg total) by mouth 2 (two) times daily as needed for anxiety.   ARTIFICIAL TEAR OP Place 1 drop into both eyes daily as needed (dry eyes).   aspirin EC 81 MG tablet Take 81 mg by mouth every other day.   budesonide-formoterol (SYMBICORT) 160-4.5 MCG/ACT inhaler Inhale 1 puff into the lungs 2 (two) times daily.   Calcium-Magnesium-Zinc 500-250-12.5 MG TABS Take 1 tablet by mouth daily.    Cholecalciferol (VITAMIN D-3) 5000 UNITS TABS Take 10,000 Units by mouth daily.    citalopram (CELEXA) 40 MG tablet Take 1 tablet (40 mg total) by mouth daily.   clindamycin (CLEOCIN)  150 MG capsule Take by mouth 3 (three) times daily.   Cyanocobalamin (B-12) 2500 MCG SUBL Place 2,500 mcg under the tongue daily.   ezetimibe (ZETIA) 10 MG tablet Take 1 tablet (10 mg total) by mouth daily.   magnesium oxide (MAG-OX) 400 MG tablet Take 400 mg by mouth daily.   meloxicam (MOBIC) 7.5 MG tablet TAKE 1 TABLET EVERY DAY AS NEEDED FOR PAIN   pravastatin (PRAVACHOL) 80 MG tablet TAKE 1 TABLET EVERY DAY    Allergies:   Codeine, Crestor [rosuvastatin calcium], Lipitor [atorvastatin], Namenda [memantine hcl], Peanut-containing drug products, and Multihance [gadobenate]   Social History   Tobacco Use   Smoking status: Former    Packs/day: 0.25    Years: 2.00    Total pack years: 0.50    Types: Cigarettes    Quit date: 09/14/1995    Years since  quitting: 26.9   Smokeless tobacco: Never  Vaping Use   Vaping Use: Never used  Substance Use Topics   Alcohol use: Not Currently    Comment: maybe 3 beers a year   Drug use: No    Types: Marijuana    Comment: in the past    Family Hx: The patient's family history includes Alcoholism in her father; CVA in her mother; Cancer in her brother; Colon cancer in her maternal aunt and another family member; Depression in her mother; Diabetes in her sister, sister, sister, and sister; Diabetes type II in her mother; Heart disease in her mother; Hyperlipidemia in her sister; Hypertension in her mother, sister, sister, and sister; Kidney disease in her sister; Osteoarthritis in her mother; Other in her mother; Pneumonia in her father. There is no history of Colon polyps.  ROS     Physical Exam:    VS:  BP 128/70   Pulse (!) 41   Ht 5' 9.5" (1.765 m)   Wt 256 lb (116.1 kg)   SpO2 99%   BMI 37.26 kg/m     Wt Readings from Last 3 Encounters:  08/30/22 256 lb (116.1 kg)  01/19/22 255 lb (115.7 kg)  12/13/21 254 lb 12.8 oz (115.6 kg)    Physical Exam  GEN: Obese, in no acute distress  Neck: no JVD, carotid bruits, or masses Cardiac:RRR; no murmurs, rubs, or gallops  Respiratory:  clear to auscultation bilaterally, normal work of breathing GI: soft, nontender, nondistended, + BS Ext: without cyanosis, clubbing, or edema, Good distal pulses bilaterally Neuro:  Alert and Oriented x 3,          EKGs/Labs/Other Test Reviewed:    EKG:  EKG is   ordered today.  The ekg ordered today demonstrates sinus bradycardia 41/m RBBB. LAFB, LVH, TWI inflat unchanged  Recent Labs: 03/15/2022: ALT 26; BUN 16; Creatinine, Ser 0.72; Potassium 4.1; Sodium 137   Recent Lipid Panel Recent Labs    03/15/22 0918  CHOL 206*  TRIG 145  HDL 48  VLDL 29  LDLCALC 129*     Prior CV Studies:   Coronary CTA 02/2022 IMPRESSION: 1. Coronary calcium score of 0. This was 0 percentile for age-, sex, and  race-matched controls.   2.  Normal coronary origin with right dominance.   3.  Mild atherosclerosis.  CAD RADS 2.   4.  Consider non atherosclerotic causes of chest pain.   5.  Recommend preventive therapy and risk factor modification.   NST 01/2022   Findings are consistent with prior myocardial infarction with peri-infarct ischemia. The study is intermediate risk.  No ST deviation was noted.   LV perfusion is abnormal. Defect 1: There is a medium defect with mild reduction in uptake present in the apical to mid inferior and inferolateral location(s) that is fixed. There is abnormal wall motion in the defect area. Consistent with infarction. Defect 2: There is a small defect present in the apical anterior and apex location(s) that is partially reversible. There is abnormal wall motion in the defect area. Consistent with infarction and peri-infarct ischemia.   Left ventricular function is abnormal. Global function is moderately reduced. Nuclear stress EF: 39 %. The left ventricular ejection fraction is moderately decreased (30-44%). End diastolic cavity size is moderately enlarged. End systolic cavity size is moderately enlarged.   Fixed perfusion defect in inferior/inferolateral walls with hypokinesis consistent with prior infarct Partially reversible defect at apex/apical anterior wall with hypokinesis consistent with infarct with peri-infarct ischemia.  Area of ischemia is small. Moderate systolic dysfunction (EF 97%) Intermediate risk study    Risk Assessment/Calculations/Metrics:     STOP-Bang Score:  5           ASSESSMENT & PLAN:   No problem-specific Assessment & Plan notes found for this encounter.   CAD mild plaque Cfx on Coronary CTA 02/2022-no angina on zetia and pravastatin  Bradycardia-HR 41 with RBBB/LAFB today. Asymptomatic. She is not on any rate lowering meds and denies dizziness. She is fatigued and has no motivation to do anything. Will place 2 week zio  Preop  clearance-no official clearance request but patient needs breast implant removed. Will wait for monitor to come back to clear her. Dr. Irene Lim[facility].  Palpitations with PVCs PACs on monitor 2021 improved with decrease caffeine  Elevated BP without HTN diagnosis-BP normal today  HLD LDL 129 02/2022 now on pravastatin and zetia. Check lipids today.   Chronic DOE echo 11/2021 normal LVEF grade 2 DD  RBBB/LAFB  Snoring and daytime fatigue-sleep study            Dispo:  No follow-ups on file.   Medication Adjustments/Labs and Tests Ordered: Current medicines are reviewed at length with the patient today.  Concerns regarding medicines are outlined above.  Tests Ordered: Orders Placed This Encounter  Procedures   Comp Met (CMET)   CBC   TSH   Lipid Profile   LONG TERM MONITOR (3-14 DAYS)   EKG 12-Lead   Itamar Sleep Study   Medication Changes: No orders of the defined types were placed in this encounter.  Sumner Boast, PA-C  08/30/2022 9:37 AM    [facility] [address], [place], [place]  [address] Phone: [phone]; Fax: [phone]

## 2022-08-20 IMAGING — MG DIGITAL SCREENING UNILAT LEFT W/ TOMO W/ CAD
4 series · 4 of 12 positions shown · non-contrast
Comparison: Previous exam(s).

CLINICAL DATA: Screening.

EXAM:
DIGITAL SCREENING UNILATERAL LEFT MAMMOGRAM WITH CAD AND
TOMOSYNTHESIS
TECHNIQUE: Left screening digital craniocaudal and mediolateral oblique
mammograms were obtained. Left screening digital breast
tomosynthesis was performed. The images were evaluated with
computer-aided detection.

[L MLO synth-2D]
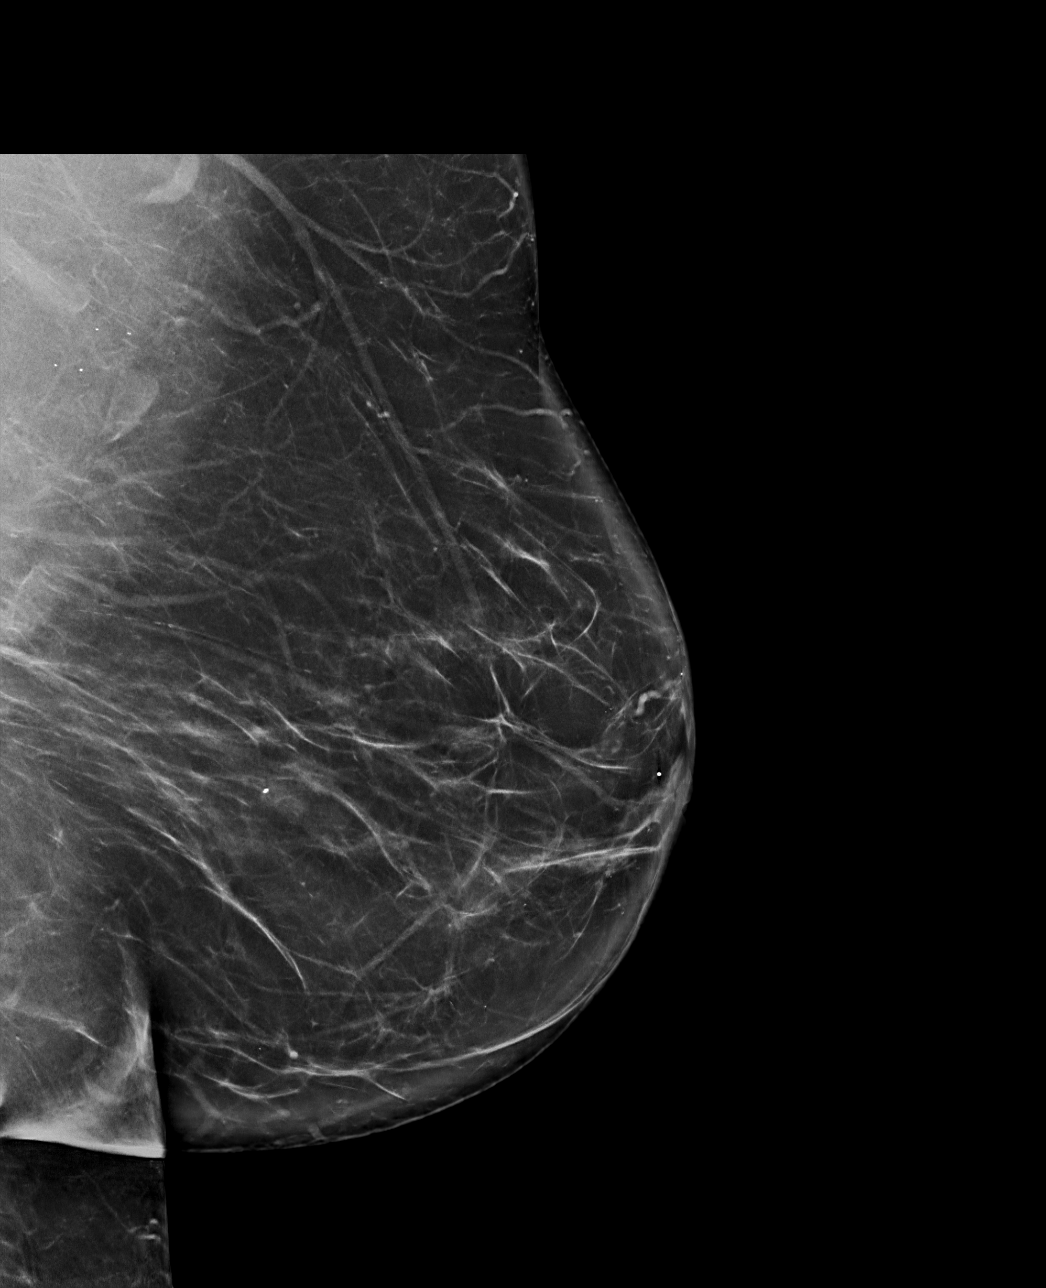

[L CC synth-2D]
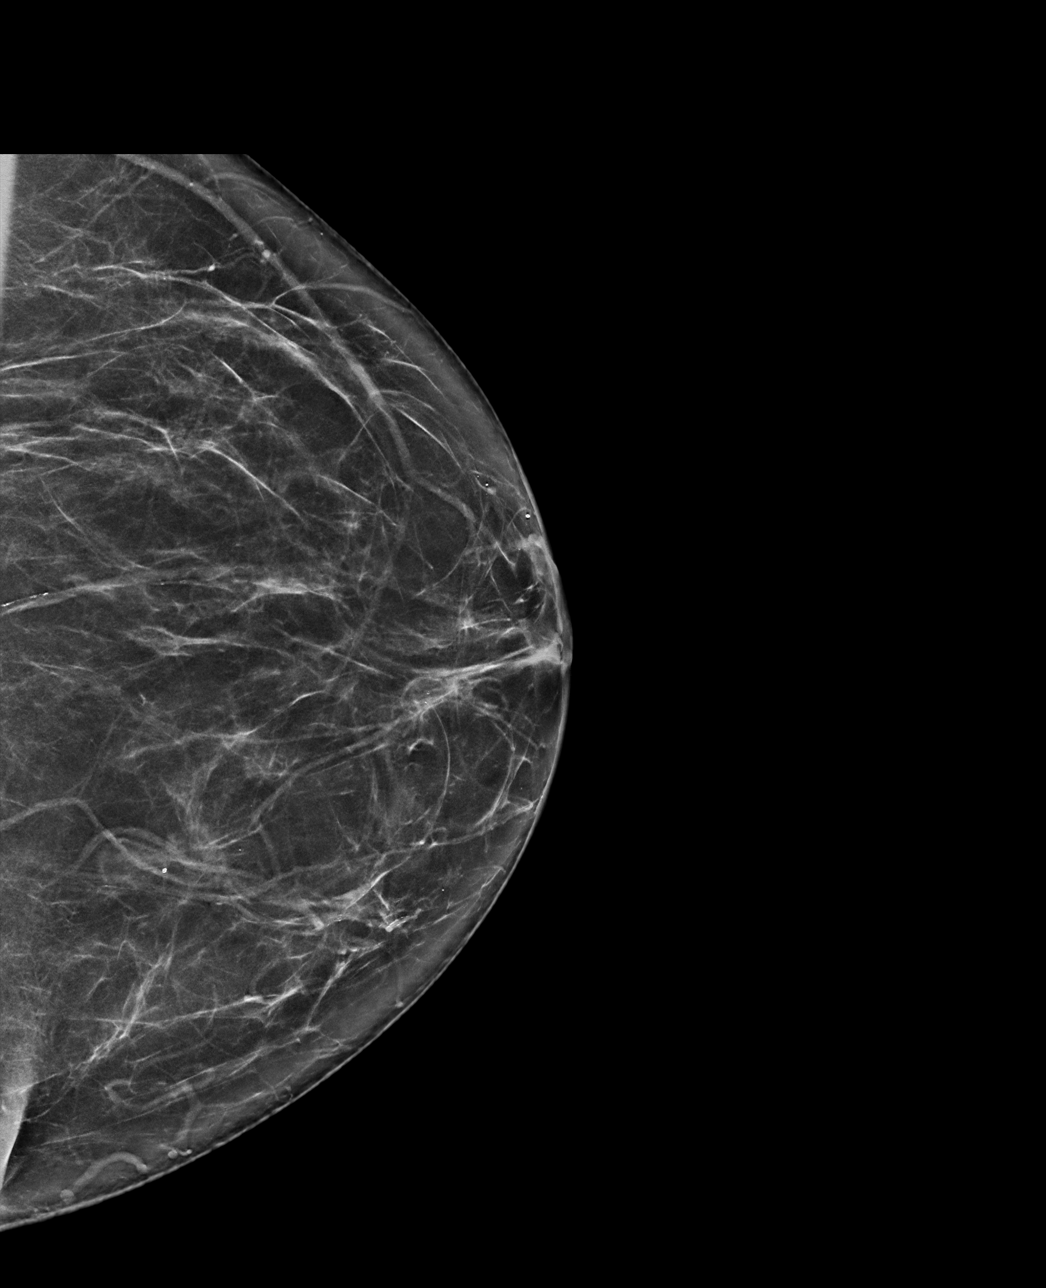

[L MLO tomo · tomo slice 49/96.0]
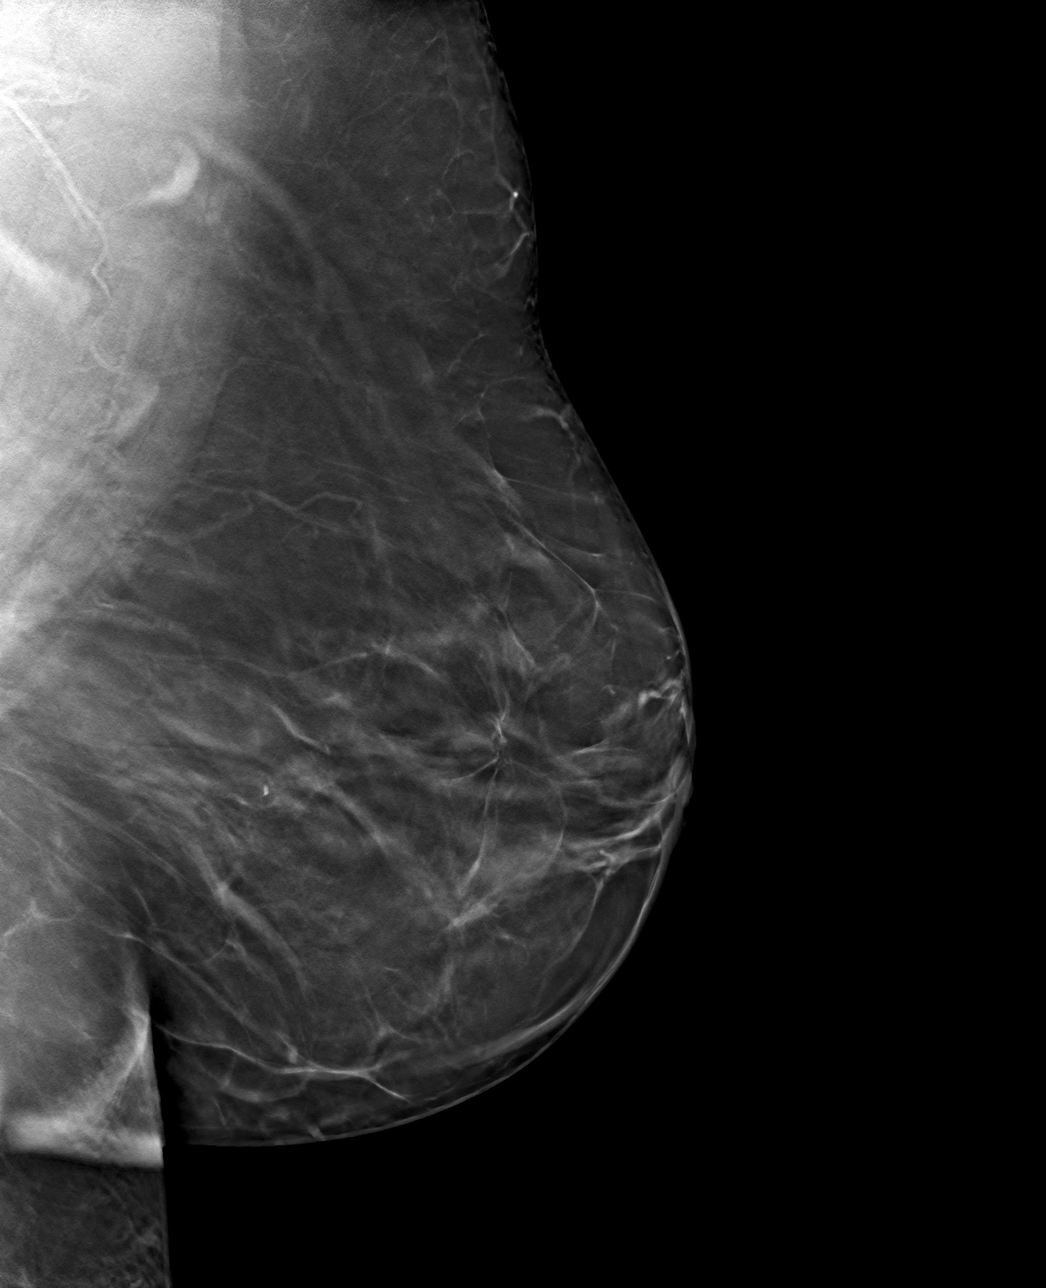

[L CC tomo · tomo slice 39/78.0]
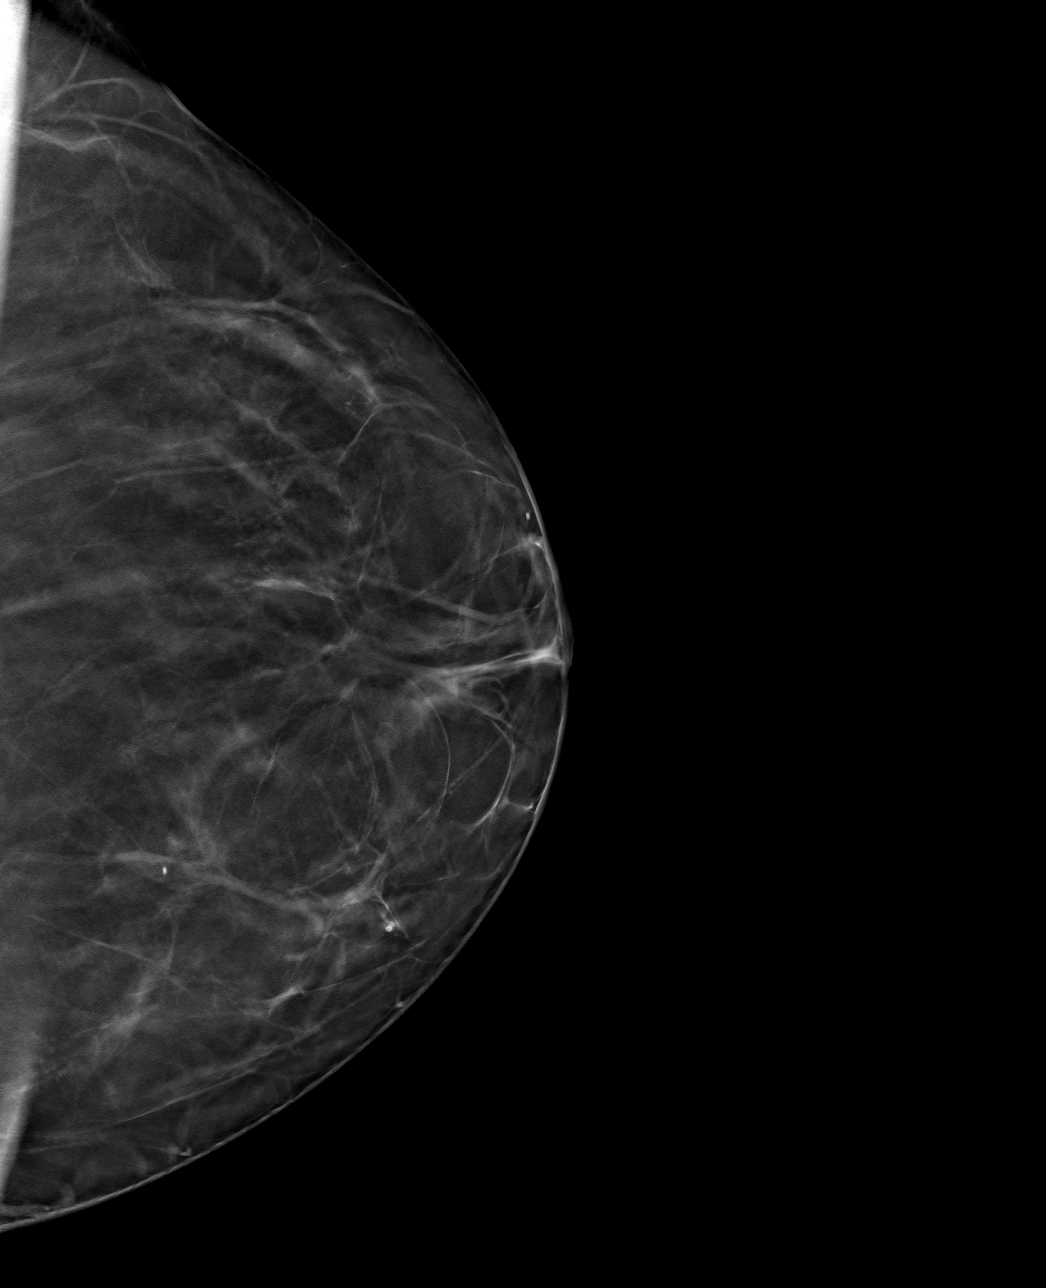

[4 of 12 positions shown; findings below may reference images not displayed]

ACR Breast Density Category b: There are scattered areas of
fibroglandular density.
FINDINGS: There are no findings suspicious for malignancy. The images were
evaluated with computer-aided detection.
IMPRESSION: No mammographic evidence of malignancy. A result letter of this
screening mammogram will be mailed directly to the patient.

RECOMMENDATION:
Screening mammogram in one year. (Code:MU-8-EGC)

BI-RADS CATEGORY  1: Negative.

## 2022-08-30 ENCOUNTER — Telehealth: Payer: Self-pay | Admitting: *Deleted

## 2022-08-30 ENCOUNTER — Encounter: Payer: Self-pay | Admitting: Physician Assistant

## 2022-08-30 ENCOUNTER — Encounter: Payer: Self-pay | Admitting: Internal Medicine

## 2022-08-30 ENCOUNTER — Ambulatory Visit: Payer: Medicare HMO | Admitting: Physician Assistant

## 2022-08-30 ENCOUNTER — Ambulatory Visit (INDEPENDENT_AMBULATORY_CARE_PROVIDER_SITE_OTHER): Payer: Medicare PPO

## 2022-08-30 ENCOUNTER — Ambulatory Visit: Payer: Medicare PPO | Attending: Physician Assistant | Admitting: Physician Assistant

## 2022-08-30 VITALS — BP 128/70 | HR 41 | Ht 69.5 in | Wt 256.0 lb

## 2022-08-30 DIAGNOSIS — R0683 Snoring: Secondary | ICD-10-CM

## 2022-08-30 DIAGNOSIS — R001 Bradycardia, unspecified: Secondary | ICD-10-CM

## 2022-08-30 DIAGNOSIS — Z01818 Encounter for other preprocedural examination: Secondary | ICD-10-CM

## 2022-08-30 DIAGNOSIS — I4510 Unspecified right bundle-branch block: Secondary | ICD-10-CM

## 2022-08-30 DIAGNOSIS — I2510 Atherosclerotic heart disease of native coronary artery without angina pectoris: Secondary | ICD-10-CM | POA: Diagnosis not present

## 2022-08-30 DIAGNOSIS — I491 Atrial premature depolarization: Secondary | ICD-10-CM

## 2022-08-30 DIAGNOSIS — E782 Mixed hyperlipidemia: Secondary | ICD-10-CM

## 2022-08-30 DIAGNOSIS — I493 Ventricular premature depolarization: Secondary | ICD-10-CM | POA: Diagnosis not present

## 2022-08-30 NOTE — Telephone Encounter (Signed)
Morgan Santos, [facility] ordered an Morgan Santos study for the pt today. Pt agreeable to signed waiver and not open the box until she has been called with the PIN#.

## 2022-08-30 NOTE — Progress Notes (Unsigned)
Enrolled patient for a 14 day ZIo XT monitor to be mailed to patients home   [place] to read

## 2022-08-30 NOTE — Telephone Encounter (Signed)
Prior Authorization for [facility] sent to [facility] via Phone. Reference # .  NO PA REQ

## 2022-08-30 NOTE — Patient Instructions (Signed)
Medication Instructions:  Your physician recommends that you continue on your current medications as directed. Please refer to the Current Medication list given to you today.  *If you need a refill on your cardiac medications before your next appointment, please call your pharmacy*   Lab Work: CMET, CBC, TSH and Lipid today If you have labs (blood work) drawn today and your tests are completely normal, you will receive your results only by: Miranda (if you have MyChart) OR A paper copy in the mail If you have any lab test that is abnormal or we need to change your treatment, we will call you to review the results.   Testing/Procedures: Your physician has recommended that you have a sleep study. This test records several body functions during sleep, including: brain activity, eye movement, oxygen and carbon dioxide blood levels, heart rate and rhythm, breathing rate and rhythm, the flow of air through your mouth and nose, snoring, body muscle movements, and chest and belly movement.  ZIO XT- Long Term Monitor Instructions  Your physician has requested you wear a ZIO patch monitor for 14 days.  This is a single patch monitor. [employer] supplies one patch monitor per enrollment. Additional stickers are not available. Please do not apply patch if you will be having a Nuclear Stress Test,  Echocardiogram, Cardiac CT, MRI, or Chest Xray during the period you would be wearing the  monitor. The patch cannot be worn during these tests. You cannot remove and re-apply the  ZIO XT patch monitor.  Your ZIO patch monitor will be mailed 3 day USPS to your address on file. It may take 3-5 days  to receive your monitor after you have been enrolled.  Once you have received your monitor, please review the enclosed instructions. Your monitor  has already been registered assigning a specific monitor serial # to you.  Billing and Patient Assistance Program Information  We have supplied [employer] with  any of your insurance information on file for billing purposes. [employer] offers a sliding scale Patient Assistance Program for patients that do not have  insurance, or whose insurance does not completely cover the cost of the ZIO monitor.  You must apply for the Patient Assistance Program to qualify for this discounted rate.  To apply, please call [employer] at [phone], select option 4, select option 2, ask to apply for  Patient Assistance Program. Theodore Demark will ask your household income, and how many people  are in your household. They will quote your out-of-pocket cost based on that information.  [employer] will also be able to set up a 45-month interest-free payment plan if needed.  Applying the monitor   Shave hair from upper left chest.  Hold abrader disc by orange tab. Rub abrader in 40 strokes over the upper left chest as  indicated in your monitor instructions.  Clean area with 4 enclosed alcohol pads. Let dry.  Apply patch as indicated in monitor instructions. Patch will be placed under collarbone on left  side of chest with arrow pointing upward.  Rub patch adhesive wings for 2 minutes. Remove white label marked "1". Remove the white  label marked "2". Rub patch adhesive wings for 2 additional minutes.  While looking in a mirror, press and release button in center of patch. A small green light will  flash 3-4 times. This will be your only indicator that the monitor has been turned on.  Do not shower for the first 24 hours. You may shower after the first 24  hours.  Press the button if you feel a symptom. You will hear a small click. Record Date, Time and  Symptom in the Patient Logbook.  When you are ready to remove the patch, follow instructions on the last 2 pages of Patient  Logbook. Stick patch monitor onto the last page of Patient Logbook.  Place Patient Logbook in the blue and white box. Use locking tab on box and tape box closed  securely. The blue and white box has prepaid  postage on it. Please place it in the mailbox as  soon as possible. Your physician should have your test results approximately 7 days after the  monitor has been mailed back to [facility].  Call [place] at [phone] if you have questions regarding  your ZIO XT patch monitor. Call them immediately if you see an orange light blinking on your  monitor.  If your monitor falls off in less than 4 days, contact our Monitor department at [phone].  If your monitor becomes loose or falls off after 4 days call [employer] at [phone] for  suggestions on securing your monitor    Follow-Up: At [facility], you and your health needs are our priority.  As part of our continuing mission to provide you with exceptional heart care, we have created designated Provider Care Teams.  These Care Teams include your primary Cardiologist (physician) and Advanced Practice Providers (APPs -  Physician Assistants and Nurse Practitioners) who all work together to provide you with the care you need, when you need it.  Your next appointment:   6 month(s)  The format for your next appointment:   In Person  Provider:   Dr Radford Pax  Important Information About Sugar

## 2022-08-31 LAB — COMPREHENSIVE METABOLIC PANEL
ALT: 19 IU/L (ref 0–32)
AST: 20 IU/L (ref 0–40)
Albumin/Globulin Ratio: 1.7 (ref 1.2–2.2)
Albumin: 3.8 g/dL (ref 3.8–4.8)
Alkaline Phosphatase: 105 IU/L (ref 44–121)
BUN/Creatinine Ratio: 23 (ref 12–28)
BUN: 18 mg/dL (ref 8–27)
Bilirubin Total: 0.4 mg/dL (ref 0.0–1.2)
CO2: 27 mmol/L (ref 20–29)
Calcium: 9 mg/dL (ref 8.7–10.3)
Chloride: 107 mmol/L — ABNORMAL HIGH (ref 96–106)
Creatinine, Ser: 0.79 mg/dL (ref 0.57–1.00)
Globulin, Total: 2.3 g/dL (ref 1.5–4.5)
Glucose: 97 mg/dL (ref 70–99)
Potassium: 4.6 mmol/L (ref 3.5–5.2)
Sodium: 142 mmol/L (ref 134–144)
Total Protein: 6.1 g/dL (ref 6.0–8.5)
eGFR: 80 mL/min/{1.73_m2} (ref >59)

## 2022-08-31 LAB — TSH: TSH: 2.13 u[IU]/mL (ref 0.450–4.500)

## 2022-08-31 LAB — CBC
Hematocrit: 42.2 % (ref 34.0–46.6)
Hemoglobin: 13.7 g/dL (ref 11.1–15.9)
MCH: 29.5 pg (ref 26.6–33.0)
MCHC: 32.5 g/dL (ref 31.5–35.7)
MCV: 91 fL (ref 79–97)
Platelets: 190 10*3/uL (ref 150–450)
RBC: 4.65 x10E6/uL (ref 3.77–5.28)
RDW: 13.2 % (ref 11.7–15.4)
WBC: 4.1 10*3/uL (ref 3.4–10.8)

## 2022-08-31 LAB — LIPID PANEL
Chol/HDL Ratio: 4.6 ratio — ABNORMAL HIGH (ref 0.0–4.4)
Cholesterol, Total: 218 mg/dL — ABNORMAL HIGH (ref 100–199)
HDL: 47 mg/dL (ref >39)
LDL Chol Calc (NIH): 137 mg/dL — ABNORMAL HIGH (ref 0–99)
Triglycerides: 191 mg/dL — ABNORMAL HIGH (ref 0–149)
VLDL Cholesterol Cal: 34 mg/dL (ref 5–40)

## 2022-09-01 NOTE — Telephone Encounter (Signed)
Left detailed message that she can proceed with Itamar study. Left message to do study by this weekend 09/04/22. Left PIN# [id number]. If any questions please call back.

## 2022-09-02 ENCOUNTER — Encounter (INDEPENDENT_AMBULATORY_CARE_PROVIDER_SITE_OTHER): Payer: Medicare PPO | Admitting: Cardiology

## 2022-09-02 DIAGNOSIS — G4733 Obstructive sleep apnea (adult) (pediatric): Secondary | ICD-10-CM

## 2022-09-04 NOTE — Procedures (Signed)
Patient Information Study Date: 09/02/2022 Patient Name: Morgan Santos Patient ID: [id number] Birth Date: [birth year] Age: 72 Gender: Female BMI: 37.9 (W=256 lb, H=5' 9'') Stopbang: 5,  Referring Physician: Ermalinda Barrios, PA  TEST DESCRIPTION: Home sleep apnea testing was completed using the WatchPat, a Type 1 device, utilizing peripheral arterial tonometry (PAT), chest movement, actigraphy, pulse oximetry, pulse rate, body position and snore. AHI was calculated with apnea and hypopnea using valid sleep time as the denominator. RDI includes apneas, hypopneas, and RERAs. The data acquired and the scoring of sleep and all associated events were performed in accordance with the recommended standards and specifications as outlined in the AASM Manual for the Scoring of Sleep and Associated Events 2.2.0 (2015).   FINDINGS:   1. Mild Obstructive Sleep Apnea with AHI 5.9/hr.   2. No Central Sleep Apnea with pAHIc 0.6/hr.   3. Oxygen desaturations as low as 87%.   4. Mild to moderate snoring was present. O2 sats were < 88% for 0 min.   5. Total sleep time was 7 hrs and 19 min.   6. 18.6% of total sleep time was spent in REM sleep.   7.  Normal sleep onset latency at 18 min.   8.  Prolonged REM sleep onset latency at 193 min.   9. Total awakenings were 6.  10. Arrhythmia detection:  Suggestive of possible brief atrial fibrillation lasting 1 min and 31 seconds.  This is not diagnostic and further testing with outpatient telemetry monitoring is recommended.  DIAGNOSIS: Mild Obstructive Sleep Apnea (G47.33) Possible Atrial Arrhythmias  RECOMMENDATIONS:   1.  Clinical correlation of these findings is necessary.  The decision to treat obstructive sleep apnea (OSA) is usually based on the presence of apnea symptoms or the presence of associated medical conditions such as Hypertension, Congestive Heart Failure, Atrial Fibrillation or Obesity.  The most common symptoms of OSA are snoring, gasping for  breath while sleeping, daytime sleepiness and fatigue.   2.  Initiating apnea therapy is recommended given the presence of symptoms and/or associated conditions. Recommend proceeding with one of the following:     a.  Auto-CPAP therapy with a pressure range of 5-20cm H2O.     b.  An oral appliance (OA) that can be obtained from certain dentists with expertise in sleep medicine.  These are primarily of use in non-obese patients with mild and moderate disease.     c.  An ENT consultation which may be useful to look for specific causes of obstruction and possible treatment options.     d.  If patient is intolerant to PAP therapy, consider referral to ENT for evaluation for hypoglossal nerve stimulator.   3.  Close follow-up is necessary to ensure success with CPAP or oral appliance therapy for maximum benefit.  4.  A follow-up oximetry study on CPAP is recommended to assess the adequacy of therapy and determine the need for supplemental oxygen or the potential need for Bi-level therapy.  An arterial blood gas to determine the adequacy of baseline ventilation and oxygenation should also be considered.  5.  Healthy sleep recommendations include:  adequate nightly sleep (normal 7-9 hrs/night), avoidance of caffeine after noon and alcohol near bedtime, and maintaining a sleep environment that is cool, dark and quiet.  6.  Weight loss for overweight patients is recommended.  Even modest amounts of weight loss can significantly improve the severity of sleep apnea.  7.  Snoring recommendations include:  weight loss where appropriate, side sleeping, and  avoidance of alcohol before bed.  8.  Operation of motor vehicle should be avoided when sleepy.  9.  Consider outpatient event monitor to assess for silent atrial arrhythmias if clinically indicated.  Signature: Fransico Him, MD; [facility]; [place], [place] Board of Sleep Medicine Electronically Signed: 09/05/2022

## 2022-09-05 ENCOUNTER — Ambulatory Visit: Payer: Medicare PPO | Attending: Physician Assistant

## 2022-09-05 DIAGNOSIS — R001 Bradycardia, unspecified: Secondary | ICD-10-CM | POA: Diagnosis not present

## 2022-09-05 DIAGNOSIS — R0683 Snoring: Secondary | ICD-10-CM

## 2022-09-06 ENCOUNTER — Telehealth: Payer: Self-pay

## 2022-09-06 DIAGNOSIS — Z79899 Other long term (current) drug therapy: Secondary | ICD-10-CM

## 2022-09-06 MED ORDER — EZETIMIBE 10 MG PO TABS
10.0000 mg | ORAL_TABLET | Freq: Every day | ORAL | 3 refills | Status: AC
Start: 2022-09-06 — End: ?

## 2022-09-06 NOTE — Telephone Encounter (Signed)
Patient made aware of results. Pt is agreeable to plan.

## 2022-09-06 NOTE — Telephone Encounter (Signed)
-----  Message from Emmaline Life, NP sent at 09/05/2022 12:01 PM EST ----- Yes, please refill Zetia and repeat ALT and lipid panel in 8 weeks. Thank you.

## 2022-09-08 ENCOUNTER — Telehealth: Payer: Self-pay | Admitting: *Deleted

## 2022-09-08 NOTE — Telephone Encounter (Signed)
-----  Message from Lauralee Evener, [place] sent at 09/06/2022  8:23 AM EST -----  ----- Message ----- From: Sueanne Margarita, MD Sent: 09/04/2022   7:54 PM EST To: Cv Div Sleep Studies  Patient has very mild OSA - set up OV to discuss treatment options.

## 2022-09-08 NOTE — Telephone Encounter (Signed)
The patient has been notified of the result and verbalized understanding.  All questions (if any) were answered. Morgan Santos, [place] 09/08/2022 6:07 PM    Pt is aware and agreeable to  her results. Pt agrees to ov to discuss her treatment options.

## 2022-09-09 NOTE — Telephone Encounter (Signed)
LVM to schedule appt with Dr. Radford Pax for sleep

## 2022-09-12 ENCOUNTER — Other Ambulatory Visit: Payer: Self-pay | Admitting: Family

## 2022-09-13 ENCOUNTER — Telehealth: Payer: Self-pay | Admitting: Cardiology

## 2022-09-13 NOTE — Telephone Encounter (Signed)
Called patient and advised her that she can mail her heart monitor back.

## 2022-09-13 NOTE — Telephone Encounter (Signed)
Pt would like a callback regarding whether she is able to turn Heart Monitor in at office or mail back in. Please advise

## 2022-09-29 ENCOUNTER — Telehealth: Payer: Self-pay | Admitting: Cardiology

## 2022-09-29 NOTE — Telephone Encounter (Signed)
Monitor report showed to the DOD Dr. Marlou Porch.   Per DOD Dr. Marlou Porch, no changes to be made and pt should continue with current treatment plan/follow-up.  Per Dr. Marlou Porch, bradycardia noted during sleep.  Monitor signed by Dr. Marlou Porch and will be placed in med rec box to be scanned into the pts chart.   Will forward this message to Ermalinda Barrios PA-C and Dr. Radford Pax, as a general FYI.   Monitor report will be uploaded to ordering Provider, Ermalinda Barrios PA-C.

## 2022-09-29 NOTE — Telephone Encounter (Signed)
Caller is reporting abnormal results.   Ref# [id number].

## 2022-09-29 NOTE — Telephone Encounter (Signed)
Called patient after speaking to [employer] regarding episode of complete heart block on 09/13/22 at 4:26 am which lasted 5.5 seconds and was not patient triggered. Full [employer] report is available in epic but has not been released.  Patient states she has had no episodes of SOB or chest pain since wearing the monitor. She states she only experiences palpitations when she gets upset and this is relieved by resting. Advised patient I would forward her information to prescriber.

## 2022-09-29 NOTE — Telephone Encounter (Signed)
Dr. Radford Pax recommending an in-lab sleep study, no answer at patient's home phone. Number listed on DPR for voice mails no longer in service, left message with no identifying information asking patient to call back.

## 2022-09-30 NOTE — Telephone Encounter (Signed)
Patient calling back with questions. Please advise

## 2022-10-03 NOTE — Telephone Encounter (Signed)
Patient is returning call.  °

## 2022-10-03 NOTE — Telephone Encounter (Signed)
Spoke with the patient who states that she was told that she needed an in-lab sleep study, however she already did a sleep study at home. Per results from [place] study the patient needs an appointment to discuss results with Dr. Radford Pax. Patient has been scheduled for an appointment.

## 2022-10-04 ENCOUNTER — Other Ambulatory Visit: Payer: Self-pay

## 2022-10-04 DIAGNOSIS — R001 Bradycardia, unspecified: Secondary | ICD-10-CM

## 2022-10-05 ENCOUNTER — Telehealth: Payer: Self-pay

## 2022-10-05 DIAGNOSIS — I4729 Other ventricular tachycardia: Secondary | ICD-10-CM

## 2022-10-05 NOTE — Telephone Encounter (Signed)
-----   Message from Loel Dubonnet, NP sent at 09/30/2022 11:10 AM EST ----- Discussed with Dr. Radford Pax.  Given episodes of AV block as well as ventricular tachycardia which is a fast heartbeat in the bottom chamber of the heart would recommend referral to electrophysiology Dr. Myles Gip

## 2022-10-07 ENCOUNTER — Encounter: Payer: Self-pay | Admitting: Cardiology

## 2022-10-07 ENCOUNTER — Ambulatory Visit: Payer: Medicare PPO | Attending: Cardiology | Admitting: Cardiology

## 2022-10-07 VITALS — BP 136/84 | HR 70 | Ht 69.5 in | Wt 259.8 lb

## 2022-10-07 DIAGNOSIS — R0609 Other forms of dyspnea: Secondary | ICD-10-CM

## 2022-10-07 DIAGNOSIS — I2583 Coronary atherosclerosis due to lipid rich plaque: Secondary | ICD-10-CM

## 2022-10-07 DIAGNOSIS — I493 Ventricular premature depolarization: Secondary | ICD-10-CM | POA: Diagnosis not present

## 2022-10-07 DIAGNOSIS — R030 Elevated blood-pressure reading, without diagnosis of hypertension: Secondary | ICD-10-CM | POA: Diagnosis not present

## 2022-10-07 DIAGNOSIS — E782 Mixed hyperlipidemia: Secondary | ICD-10-CM | POA: Diagnosis not present

## 2022-10-07 DIAGNOSIS — I2510 Atherosclerotic heart disease of native coronary artery without angina pectoris: Secondary | ICD-10-CM

## 2022-10-07 NOTE — Progress Notes (Signed)
Cardiology Office Note  Date: 10/07/2022   ID: Chenell, Dulong 1950-10-25, MRN [id number]  PCP:  Antony Blackbird, MD  Cardiologist:  Fransico Him, MD Electrophysiologist:  None   Chief Complaint: CAD   History of Present Illness: Morgan Santos is a 72 y.o. female with a history of palpitations, left anterior fascicular block hyperlipidemia, elevated BP without diagnosis of hypertension.  Echo 2021 showed grade 1 diastolic dysfunction with EF of 50 to 55%.  Event monitor in 2021 showed PACs and PVCs.  Repeat echo November 30, 2021 showed normal LV function with EF 55 to 65%, mild LVH, grade 2 diastolic dysfunction.  EKG done November 30, 2021 showed sinus rhythm right bundle branch block and left anterior fascicular block with PACs and LVH by voltage criteria  At last office visit nuclear stress test was ordered because of her right bundle branch block which showed Fixed perfusion defect in inferior/inferolateral walls with hypokinesis consistent with prior infarct and a Partially reversible defect at apex/apical anterior wall with hypokinesis consistent with infarct with peri-infarct ischemia.  Area of ischemia is small.  Coronary CTA demonstrated a coronary calcium score of 0 with 25 to 49% stenosis the proximal left circumflex otherwise normal coronary arteries  She is here today for followup and is doing well.  She denies any chest pain or pressure, SOB, DOE, PND, orthopnea, LE edema, dizziness or syncope.  Occasionally she will notice some skipped heart beats at night.  She is compliant with her meds and is tolerating meds with no SE.    Past Medical History:  Diagnosis Date   Anemia    when she was younger   Anxiety    Arthritis    hip- R, back; osteoarthritis   Asthma    Breast cancer ([place])    right   CAD (coronary artery disease), native coronary artery    25-49% LCx by CTA [id number]   Complication of anesthesia    Constipation    Depression    pt. remarks that she "takes  respridal because if I don't take it I get angry"    GERD (gastroesophageal reflux disease)    pt. uses vinegar for heartburn, ususally once per week    H/O degenerative disc disease    Hyperlipidemia    Hypertension    "when I get upset"  Not on medication   Memory loss    PONV (postoperative nausea and vomiting)    "just for one day"   Stroke [facility]) ?2010 or 2011   2010, [facility] for stroke "mini stroke" short term memory problems since    Past Surgical History:  Procedure Laterality Date   BREAST RECONSTRUCTION WITH PLACEMENT OF TISSUE EXPANDER AND FLEX HD (ACELLULAR HYDRATED DERMIS) Right 07/12/2013   Procedure: RIGHT BREAST RECONSTRUCTION WITH PLACEMENT OF TISSUE EXPANDER AND FLEX HD TO RIGHT BREAST (ACELLULAR HYDRATED DERMIS);  Surgeon: Irene Limbo, MD;  Location: [place];  Service: Plastics;  Laterality: Right;   BREAST SURGERY     for abcesses- removed from both breasts, many yrs. ago   CHOLECYSTECTOMY     COLONOSCOPY   01/29/2004   NUR:A 7-8 mm polyp snared from the hepatic flexure.  Another 3 mm polyp was     cold snared from the sigmoid colon.External hemorrhoids, possible source of recent rectal bleeding   COLONOSCOPY N/A 06/05/2014   Procedure: COLONOSCOPY;  Surgeon: Danie Binder, MD;  [facility];  Service: Endoscopy;  Laterality: N/A;  1030 -  moved to 10:45 - Ginger to notify pt    COLONOSCOPY WITH PROPOFOL N/A 10/17/2017   Procedure: COLONOSCOPY WITH PROPOFOL;  Surgeon: Danie Binder, MD;  [facility];  Service: Endoscopy;  Laterality: N/A;  10:30am   DILATION AND CURETTAGE OF UTERUS     LUMBAR SPINE SURGERY     MASTECTOMY Right    MASTECTOMY W/ SENTINEL NODE BIOPSY Right 07/12/2013   Procedure: MASTECTOMY WITH SENTINEL LYMPH NODE BIOPSY;  Surgeon: Rolm Bookbinder, MD;  Location: [place];  Service: General;  Laterality: Right;   MASTOPEXY Left 01/14/2014   Procedure: LEFT BREAST MASTOPEXY FOR SYMMETRY ;  Surgeon: Irene Limbo,  MD;  Location: [place];  Service: Plastics;  Laterality: Left;   PORT-A-CATH REMOVAL Left 01/14/2014   Procedure: REMOVAL PORT-A-CATH;  Surgeon: Irene Limbo, MD;  Location: [place];  Service: Plastics;  Laterality: Left;   PORTACATH PLACEMENT N/A 09/16/2013   Procedure: INSERTION PORT-A-CATH;  Surgeon: Rolm Bookbinder, MD;  Location: [place];  Service: General;  Laterality: N/A;   REDUCTION MAMMAPLASTY Left    REMOVAL OF BILATERAL TISSUE EXPANDERS WITH PLACEMENT OF BILATERAL BREAST IMPLANTS Right 01/14/2014   Procedure: REMOVAL OF RIGHT TISSUE EXPANDERS WITH PLACEMENT OF PERMANENT BREAST IMPLANT;  Surgeon: Irene Limbo, MD;  Location: [place];  Service: Plastics;  Laterality: Right;   TUBAL LIGATION     VAGINAL HYSTERECTOMY      Current Outpatient Medications  Medication Sig Dispense Refill   albuterol (VENTOLIN HFA) 108 (90 Base) MCG/ACT inhaler USE 2 PUFFS EVERY 6 HOURS AS NEEDED 8.5 g 1   ALPRAZolam (XANAX) 0.5 MG tablet Take 0.5-1 tablets (0.25-0.5 mg total) by mouth 2 (two) times daily as needed for anxiety. 60 tablet 1   ARTIFICIAL TEAR OP Place 1 drop into both eyes daily as needed (dry eyes).     aspirin EC 81 MG tablet Take 81 mg by mouth every other day.     budesonide-formoterol (SYMBICORT) 160-4.5 MCG/ACT inhaler Inhale 1 puff into the lungs 2 (two) times daily. 1 Inhaler 3   Calcium-Magnesium-Zinc 500-250-12.5 MG TABS Take 1 tablet by mouth daily.      Cholecalciferol (VITAMIN D-3) 5000 UNITS TABS Take 10,000 Units by mouth daily.      citalopram (CELEXA) 40 MG tablet Take 1 tablet (40 mg total) by mouth daily. 90 tablet 3   Cyanocobalamin (B-12) 2500 MCG SUBL Place 2,500 mcg under the tongue daily.     ezetimibe (ZETIA) 10 MG tablet Take 1 tablet (10 mg total) by mouth daily. 90 tablet 3   magnesium oxide (MAG-OX) 400 MG tablet Take 400 mg by mouth daily.     meloxicam (MOBIC) 7.5 MG tablet TAKE 1 TABLET EVERY DAY AS  NEEDED FOR PAIN 90 tablet 2   pravastatin (PRAVACHOL) 80 MG tablet TAKE 1 TABLET EVERY DAY 90 tablet 1   solifenacin (VESICARE) 5 MG tablet Take 5 mg by mouth daily.     No current facility-administered medications for this visit.   Allergies:  Codeine, Crestor [rosuvastatin calcium], Lipitor [atorvastatin], Namenda [memantine hcl], Peanut-containing drug products, and Multihance [gadobenate]   Social History: The patient  reports that she quit smoking about 27 years ago. Her smoking use included cigarettes. She has a 0.50 pack-year smoking history. She has never used smokeless tobacco. She reports that she does not currently use alcohol. She reports that she does not use drugs.   Family History: The patient's family history includes Alcoholism in  her father; CVA in her mother; Cancer in her brother; Colon cancer in her maternal aunt and another family member; Depression in her mother; Diabetes in her sister, sister, sister, and sister; Diabetes type II in her mother; Heart disease in her mother; Hyperlipidemia in her sister; Hypertension in her mother, sister, sister, and sister; Kidney disease in her sister; Osteoarthritis in her mother; Other in her mother; Pneumonia in her father.   ROS:  Please see the history of present illness. Otherwise, complete review of systems is positive for none.  All other systems are reviewed and negative.   Physical Exam: VS:  BP 136/84   Pulse 70   Ht 5' 9.5" (1.765 m)   Wt 259 lb 12.8 oz (117.8 kg)   SpO2 95%   BMI 37.82 kg/m , BMI Body mass index is 37.82 kg/m.  Wt Readings from Last 3 Encounters:  10/07/22 259 lb 12.8 oz (117.8 kg)  08/30/22 256 lb (116.1 kg)  01/19/22 255 lb (115.7 kg)    GEN: Well nourished, well developed in no acute distress HEENT: Normal NECK: No JVD; No carotid bruits LYMPHATICS: No lymphadenopathy CARDIAC:RRR, no murmurs, rubs, gallops RESPIRATORY:  Clear to auscultation without rales, wheezing or rhonchi  ABDOMEN: Soft,  non-tender, non-distended MUSCULOSKELETAL:  No edema; No deformity  SKIN: Warm and dry NEUROLOGIC:  Alert and oriented x 3 PSYCHIATRIC:  Normal affect   ECG:  performed today and demonstrates NSR with PACs and first degree AV block, LAFB and LVH by voltage with repol abnormality  Recent Labwork: 08/30/2022: ALT 19; AST 20; BUN 18; Creatinine, Ser 0.79; Hemoglobin 13.7; Platelets 190; Potassium 4.6; Sodium 142; TSH 2.130     Component Value Date/Time   CHOL 218 (H) 08/30/2022 0952   TRIG 191 (H) 08/30/2022 0952   HDL 47 08/30/2022 0952   CHOLHDL 4.6 (H) 08/30/2022 0952   CHOLHDL 4.3 03/15/2022 0918   VLDL 29 03/15/2022 0918   LDLCALC 137 (H) 08/30/2022 0952   LDLDIRECT 101 (H) 04/09/2021 1555    Other Studies Reviewed Today:  Nuclear stress test 6/23 Narrative & Impression      Findings are consistent with prior myocardial infarction with peri-infarct ischemia. The study is intermediate risk.   No ST deviation was noted.   LV perfusion is abnormal. Defect 1: There is a medium defect with mild reduction in uptake present in the apical to mid inferior and inferolateral location(s) that is fixed. There is abnormal wall motion in the defect area. Consistent with infarction. Defect 2: There is a small defect present in the apical anterior and apex location(s) that is partially reversible. There is abnormal wall motion in the defect area. Consistent with infarction and peri-infarct ischemia.   Left ventricular function is abnormal. Global function is moderately reduced. Nuclear stress EF: 39 %. The left ventricular ejection fraction is moderately decreased (30-44%). End diastolic cavity size is moderately enlarged. End systolic cavity size is moderately enlarged.   Fixed perfusion defect in inferior/inferolateral walls with hypokinesis consistent with prior infarct Partially reversible defect at apex/apical anterior wall with hypokinesis consistent with infarct with peri-infarct ischemia.   Area of ischemia is small. Moderate systolic dysfunction (EF [id number]) Intermediate risk study   Coronary CTA 02/2022 IMPRESSION: 1. Coronary calcium score of 0. This was 0 percentile for age-, sex, and race-matched controls.   2.  Normal coronary origin with right dominance.   3.  Mild atherosclerosis.  CAD RADS 2.   4.  Consider non atherosclerotic causes of chest  pain.   5.  Recommend preventive therapy and risk factor modification.  Assessment and Plan:   1.  PVCs/PACs -initially noted on event monitor in 2021  -occasionally she will have a skipped heart beat at night.   2. Elevated blood-pressure reading, without diagnosis of hypertension -BP is controlled on exam today -Continue low-sodium diet  3. Mixed hyperlipidemia  -LDL goal l< 70 due to CAD -Apparently she had not been taking her Zetia because she did not have any more prescriptions but recently restarted it -Continue prescription management with pravastatin 80 mg daily and Zetia 10 mg daily with as needed refills -She has lipids pending soon  4.  Chronic dyspnea on exertion/shortness of breath -2D echo 2021 essentially unchanged from previous echo.  EF of 50 to 55%,  Grade 1 DD, trivial MR.   -Shortness of breath  improved after starting exercise program   5.  ASCAD -In 2015 she had a left anterior fascicular block and right bundle branch block is now new -She continues to have no anginal symptoms -Lexiscan myoview was intermediate risk showing Fixed perfusion defect in inferior/inferolateral walls with hypokinesis consistent with prior infarct and a Partially reversible defect at apex/apical anterior wall with hypokinesis consistent with infarct with peri-infarct ischemia.  Area of ischemia is small. -Coronary CTA demonstrated a coronary calcium score of 0 with 25 to 49% stenosis the proximal left circumflex otherwise normal coronary arteries -continue ASA 60m daily, statin   Medication Adjustments/Labs and Tests  Ordered: Current medicines are reviewed at length with the patient today.  Concerns regarding medicines are outlined above.   Disposition: As needed follow-up with nuclear stress test is normal  Signed, ALevell July NP 10/07/2022 3:11 PM    [facility] [address] [place] [id number]: [phone] Fax: [phone]

## 2022-10-07 NOTE — Patient Instructions (Signed)
Medication Instructions:  Your physician recommends that you continue on your current medications as directed. Please refer to the Current Medication list given to you today.  *If you need a refill on your cardiac medications before your next appointment, please call your pharmacy*   Lab Work: None.  If you have labs (blood work) drawn today and your tests are completely normal, you will receive your results only by: [place] (if you have MyChart) OR A paper copy in the mail If you have any lab test that is abnormal or we need to change your treatment, we will call you to review the results.   Testing/Procedures: None.   Follow-Up:  Your next appointment:   1 year(s)  Provider:   Fransico Him, MD

## 2022-10-10 NOTE — Progress Notes (Unsigned)
Referring Provider:*** Primary Care Physician:  Antony Blackbird, MD Primary Gastroenterologist:  Dr. Oneida Alar previously, now with Dr. Abbey Chatters.  No chief complaint on file.   HPI:   Morgan Santos is a 72 y.o. female with history of anxiety/depression, asthma, breast cancer, CAD, HTN, HLD, grade 1 diastolic dysfunction, stroke, GERD, constipation, adenomatous colon polyps, presenting today to discuss scheduling surveillance colonoscopy ***  We have not seen patient since January 2019.  Her GERD was well-controlled on omeprazole 20 mg daily.  No other significant GI symptoms.  She was scheduled for surveillance colonoscopy which was completed in February 2019 revealing mild diverticulosis in the rectosigmoid, sigmoid, and descending colon, significant looping of the colon, external and internal hemorrhoids.  Recommended repeat colonoscopy in 5 years for surveillance.  Today:    Past Medical History:  Diagnosis Date   Anemia    when she was younger   Anxiety    Arthritis    hip- R, back; osteoarthritis   Asthma    Breast cancer ([place])    right   CAD (coronary artery disease), native coronary artery    25-49% LCx by CTA [id number]   Complication of anesthesia    Constipation    Depression    pt. remarks that she "takes respridal because if I don't take it I get angry"    GERD (gastroesophageal reflux disease)    pt. uses vinegar for heartburn, ususally once per week    H/O degenerative disc disease    Hyperlipidemia    Hypertension    "when I get upset"  Not on medication   Memory loss    PONV (postoperative nausea and vomiting)    "just for one day"   Stroke [facility]) ?2010 or 2011   2010, [facility] for stroke "mini stroke" short term memory problems since    Past Surgical History:  Procedure Laterality Date   BREAST RECONSTRUCTION WITH PLACEMENT OF TISSUE EXPANDER AND FLEX HD (ACELLULAR HYDRATED DERMIS) Right 07/12/2013   Procedure: RIGHT BREAST RECONSTRUCTION WITH PLACEMENT  OF TISSUE EXPANDER AND FLEX HD TO RIGHT BREAST (ACELLULAR HYDRATED DERMIS);  Surgeon: Irene Limbo, MD;  Location: [place];  Service: Plastics;  Laterality: Right;   BREAST SURGERY     for abcesses- removed from both breasts, many yrs. ago   CHOLECYSTECTOMY     COLONOSCOPY   01/29/2004   NUR:A 7-8 mm polyp snared from the hepatic flexure.  Another 3 mm polyp was     cold snared from the sigmoid colon.External hemorrhoids, possible source of recent rectal bleeding   COLONOSCOPY N/A 06/05/2014   Procedure: COLONOSCOPY;  Surgeon: Danie Binder, MD;  [facility];  Service: Endoscopy;  Laterality: N/A;  1030 - moved to 10:45 - Ginger to notify pt    COLONOSCOPY WITH PROPOFOL N/A 10/17/2017   Procedure: COLONOSCOPY WITH PROPOFOL;  Surgeon: Danie Binder, MD;  [facility];  Service: Endoscopy;  Laterality: N/A;  10:30am   DILATION AND CURETTAGE OF UTERUS     LUMBAR SPINE SURGERY     MASTECTOMY Right    MASTECTOMY W/ SENTINEL NODE BIOPSY Right 07/12/2013   Procedure: MASTECTOMY WITH SENTINEL LYMPH NODE BIOPSY;  Surgeon: Rolm Bookbinder, MD;  Location: [place];  Service: General;  Laterality: Right;   MASTOPEXY Left 01/14/2014   Procedure: LEFT BREAST MASTOPEXY FOR SYMMETRY ;  Surgeon: Irene Limbo, MD;  Location: [place];  Service: Plastics;  Laterality: Left;   PORT-A-CATH REMOVAL Left 01/14/2014  Procedure: REMOVAL PORT-A-CATH;  Surgeon: Irene Limbo, MD;  Location: [place];  Service: Plastics;  Laterality: Left;   PORTACATH PLACEMENT N/A 09/16/2013   Procedure: INSERTION PORT-A-CATH;  Surgeon: Rolm Bookbinder, MD;  Location: [place];  Service: General;  Laterality: N/A;   REDUCTION MAMMAPLASTY Left    REMOVAL OF BILATERAL TISSUE EXPANDERS WITH PLACEMENT OF BILATERAL BREAST IMPLANTS Right 01/14/2014   Procedure: REMOVAL OF RIGHT TISSUE EXPANDERS WITH PLACEMENT OF PERMANENT BREAST IMPLANT;  Surgeon: Irene Limbo, MD;  Location:  [place];  Service: Plastics;  Laterality: Right;   TUBAL LIGATION     VAGINAL HYSTERECTOMY      Current Outpatient Medications  Medication Sig Dispense Refill   albuterol (VENTOLIN HFA) 108 (90 Base) MCG/ACT inhaler USE 2 PUFFS EVERY 6 HOURS AS NEEDED 8.5 g 1   ALPRAZolam (XANAX) 0.5 MG tablet Take 0.5-1 tablets (0.25-0.5 mg total) by mouth 2 (two) times daily as needed for anxiety. 60 tablet 1   ARTIFICIAL TEAR OP Place 1 drop into both eyes daily as needed (dry eyes).     aspirin EC 81 MG tablet Take 81 mg by mouth every other day.     budesonide-formoterol (SYMBICORT) 160-4.5 MCG/ACT inhaler Inhale 1 puff into the lungs 2 (two) times daily. 1 Inhaler 3   Calcium-Magnesium-Zinc 500-250-12.5 MG TABS Take 1 tablet by mouth daily.      Cholecalciferol (VITAMIN D-3) 5000 UNITS TABS Take 10,000 Units by mouth daily.      citalopram (CELEXA) 40 MG tablet Take 1 tablet (40 mg total) by mouth daily. 90 tablet 3   Cyanocobalamin (B-12) 2500 MCG SUBL Place 2,500 mcg under the tongue daily.     ezetimibe (ZETIA) 10 MG tablet Take 1 tablet (10 mg total) by mouth daily. 90 tablet 3   magnesium oxide (MAG-OX) 400 MG tablet Take 400 mg by mouth daily.     meloxicam (MOBIC) 7.5 MG tablet TAKE 1 TABLET EVERY DAY AS NEEDED FOR PAIN 90 tablet 2   pravastatin (PRAVACHOL) 80 MG tablet TAKE 1 TABLET EVERY DAY 90 tablet 1   solifenacin (VESICARE) 5 MG tablet Take 5 mg by mouth daily.     No current facility-administered medications for this visit.    Allergies as of 10/12/2022 - Review Complete 10/07/2022  Allergen Reaction Noted   Codeine Nausea And Vomiting 06/19/2013   Crestor [rosuvastatin calcium]  02/06/2015   Lipitor [atorvastatin]  02/06/2015   Namenda [memantine hcl]  10/02/2017   Peanut-containing drug products  10/02/2017   Multihance [gadobenate] Nausea Only and Cough 06/18/2013    Family History  Problem Relation Age of Onset   Hypertension Mother    Diabetes type II  Mother    CVA Mother    Heart disease Mother    Osteoarthritis Mother    Depression Mother    Other Mother        degenerative disc disease   Alcoholism Father    Pneumonia Father    Colon cancer Maternal Aunt        age 71s   Colon cancer Other        maternal great aunt   Diabetes Sister    Hyperlipidemia Sister    Hypertension Sister    Cancer Brother    Diabetes Sister    Hypertension Sister    Diabetes Sister    Hypertension Sister    Diabetes Sister    Kidney disease Sister    Colon polyps Neg Hx  Social History   Socioeconomic History   Marital status: Married    Spouse name: [name]   Number of children: 4   Years of education: 12th   Highest education level: High school graduate  Occupational History   Occupation: retired 2014    Comment: Charity fundraiser  Tobacco Use   Smoking status: Former    Packs/day: 0.25    Years: 2.00    Total pack years: 0.50    Types: Cigarettes    Quit date: 09/14/1995    Years since quitting: 27.0   Smokeless tobacco: Never  Vaping Use   Vaping Use: Never used  Substance and Sexual Activity   Alcohol use: Not Currently    Comment: maybe 3 beers a year   Drug use: No    Types: Marijuana    Comment: in the past   Sexual activity: Yes  Other Topics Concern   Not on file  Social History Narrative   Lives at home with her husband.   Right-handed.   1 cup coffee per day, occasional soda.   Social Determinants of Health   Financial Resource Strain: Low Risk  (10/22/2021)   Overall Financial Resource Strain (CARDIA)    Difficulty of Paying Living Expenses: Not very hard  Food Insecurity: No Food Insecurity (10/22/2021)   Hunger Vital Sign    Worried About Running Out of Food in the Last Year: Never true    Ran Out of Food in the Last Year: Never true  Transportation Needs: No Transportation Needs (10/22/2021)   PRAPARE - Hydrologist (Medical): No    Lack of Transportation (Non-Medical): No   Physical Activity: Insufficiently Active (10/22/2021)   Exercise Vital Sign    Days of Exercise per Week: 3 days    Minutes of Exercise per Session: 30 min  Stress: Stress Concern Present (11/23/2021)   [place]    Feeling of Stress : Rather much  Social Connections: Socially Integrated (10/22/2021)   Social Connection and Isolation Panel [NHANES]    Frequency of Communication with Friends and Family: More than three times a week    Frequency of Social Gatherings with Friends and Family: More than three times a week    Attends Religious Services: More than 4 times per year    Active Member of [employer] or Organizations: Yes    Attends Music therapist: More than 4 times per year    Marital Status: Married  Human resources officer Violence: Not At Risk (10/22/2021)   Humiliation, Afraid, Rape, and Kick questionnaire    Fear of Current or Ex-Partner: No    Emotionally Abused: No    Physically Abused: No    Sexually Abused: No    Review of Systems: Gen: Denies any fever, chills, fatigue, weight loss, lack of appetite.  CV: Denies chest pain, heart palpitations, peripheral edema, syncope.  Resp: Denies shortness of breath at rest or with exertion. Denies wheezing or cough.  GI: Denies dysphagia or odynophagia. Denies jaundice, hematemesis, fecal incontinence. GU : Denies urinary burning, urinary frequency, urinary hesitancy MS: Denies joint pain, muscle weakness, cramps, or limitation of movement.  Derm: Denies rash, itching, dry skin Psych: Denies depression, anxiety, memory loss, and confusion Heme: Denies bruising, bleeding, and enlarged lymph nodes.  Physical Exam: There were no vitals taken for this visit. General:   Alert and oriented. Pleasant and cooperative. Well-nourished and well-developed.  Head:  Normocephalic and atraumatic. Eyes:  Without icterus, sclera clear and conjunctiva pink.  Ears:  Normal  auditory acuity. Lungs:  Clear to auscultation bilaterally. No wheezes, rales, or rhonchi. No distress.  Heart:  S1, S2 present without murmurs appreciated.  Abdomen:  +BS, soft, non-tender and non-distended. No HSM noted. No guarding or rebound. No masses appreciated.  Rectal:  Deferred  Msk:  Symmetrical without gross deformities. Normal posture. Extremities:  Without edema. Neurologic:  Alert and  oriented x4;  grossly normal neurologically. Skin:  Intact without significant lesions or rashes. Psych:  Alert and cooperative. Normal mood and affect.    Assessment:     Plan:  ***   Aliene Altes, PA-C [facility] 10/12/2022

## 2022-10-12 ENCOUNTER — Ambulatory Visit (INDEPENDENT_AMBULATORY_CARE_PROVIDER_SITE_OTHER): Payer: Medicare PPO | Admitting: Gastroenterology

## 2022-10-12 ENCOUNTER — Telehealth: Payer: Self-pay | Admitting: *Deleted

## 2022-10-12 ENCOUNTER — Encounter: Payer: Self-pay | Admitting: Gastroenterology

## 2022-10-12 VITALS — BP 131/76 | HR 54 | Temp 97.5°F | Ht 69.5 in | Wt 256.8 lb

## 2022-10-12 DIAGNOSIS — K219 Gastro-esophageal reflux disease without esophagitis: Secondary | ICD-10-CM

## 2022-10-12 DIAGNOSIS — Z86010 Personal history of colonic polyps: Secondary | ICD-10-CM | POA: Diagnosis not present

## 2022-10-12 NOTE — Progress Notes (Signed)
GI Office Note    Referring Provider: Antony Blackbird, MD Primary Care Physician:  Antony Blackbird, MD  Primary Gastroenterologist: Elon Alas. Abbey Chatters, DO  Chief Complaint   Chief Complaint  Patient presents with   Colonoscopy    Ov for colonoscopy   History of Present Illness   Morgan Santos is a 72 y.o. female presenting today at the request of Fulp, Cammie, MD to discuss scheduling surveillance colonoscopy with history of anxiety/depression, asthma, breast cancer, CAD, HTN, HLD, grade 1 diastolic dysfunction, stroke, GERD, constipation, adenomatous colon polyps, presenting today to discuss scheduling surveillance colonoscopy   We have not seen patient since January 2019.  Her GERD was well-controlled on omeprazole 20 mg daily.  No other significant GI symptoms.  She was scheduled for surveillance colonoscopy.  Colonoscopy February 2019: -Mild diverticulosis in the rectosigmoid, sigmoid, descending colon -Significant looping of colon -External and internal hemorrhoids -Repeat colonoscopy 5 years.  Ziopatch started 08/30/22. Results on 09/29/22: Episodes of AV block, V. tach, and junctional rhythm.  Referral placed for EP evaluation 10/05/2022.  Last visit with cardiology 10/07/2022.  Advised.  No additional testing, lab work, or medications provided.  Today: GERD: Reflux is okay. Happens if she eats wrong thing, unable to pinpoint something. Takes apple cider vinegar 2-3 times a week usually and that usually controls it. Has recently cut back on this and thinks that is why she is having more symptoms. Symptoms always in the evenings and wakes her up with throat clearing and coughing. No dysphagia, N/V, melena, BRBPR, lack of appetite, early satiety, unintentional weight loss, abdominal pain.  Feeling okay. Wants to lose weight. Also having issues with her cholesterol. Admits her diet is not good.   PCP put her on miralax, taking it once every morning. 1 full cap. Was having some  constipation. Now with miralax she may go twice a day. Reports stool is skinny but is going a lot.  Denies any diarrhea    Current Outpatient Medications  Medication Sig Dispense Refill   albuterol (VENTOLIN HFA) 108 (90 Base) MCG/ACT inhaler USE 2 PUFFS EVERY 6 HOURS AS NEEDED 8.5 g 1   ALPRAZolam (XANAX) 0.5 MG tablet Take 0.5-1 tablets (0.25-0.5 mg total) by mouth 2 (two) times daily as needed for anxiety. 60 tablet 1   ARTIFICIAL TEAR OP Place 1 drop into both eyes daily as needed (dry eyes).     aspirin EC 81 MG tablet Take 81 mg by mouth every other day.     budesonide-formoterol (SYMBICORT) 160-4.5 MCG/ACT inhaler Inhale 1 puff into the lungs 2 (two) times daily. 1 Inhaler 3   Calcium-Magnesium-Zinc 500-250-12.5 MG TABS Take 1 tablet by mouth daily.      Cholecalciferol (VITAMIN D-3) 5000 UNITS TABS Take 10,000 Units by mouth daily.      citalopram (CELEXA) 40 MG tablet Take 1 tablet (40 mg total) by mouth daily. 90 tablet 3   Cyanocobalamin (B-12) 2500 MCG SUBL Place 2,500 mcg under the tongue daily.     ezetimibe (ZETIA) 10 MG tablet Take 1 tablet (10 mg total) by mouth daily. 90 tablet 3   magnesium oxide (MAG-OX) 400 MG tablet Take 400 mg by mouth daily.     meloxicam (MOBIC) 7.5 MG tablet TAKE 1 TABLET EVERY DAY AS NEEDED FOR PAIN 90 tablet 2   pravastatin (PRAVACHOL) 80 MG tablet TAKE 1 TABLET EVERY DAY 90 tablet 1   solifenacin (VESICARE) 5 MG tablet Take 5 mg by mouth daily.  No current facility-administered medications for this visit.    Past Medical History:  Diagnosis Date   Anemia    when she was younger   Anxiety    Arthritis    hip- R, back; osteoarthritis   Asthma    Breast cancer ([place])    right   CAD (coronary artery disease), native coronary artery    25-49% LCx by CTA [id number]   Complication of anesthesia    Constipation    Depression    pt. remarks that she "takes respridal because if I don't take it I get angry"    GERD (gastroesophageal reflux  disease)    pt. uses vinegar for heartburn, ususally once per week    H/O degenerative disc disease    Hyperlipidemia    Hypertension    "when I get upset"  Not on medication   Memory loss    PONV (postoperative nausea and vomiting)    "just for one day"   Stroke [facility]) ?2010 or 2011   2010, [facility] for stroke "mini stroke" short term memory problems since    Past Surgical History:  Procedure Laterality Date   BREAST RECONSTRUCTION WITH PLACEMENT OF TISSUE EXPANDER AND FLEX HD (ACELLULAR HYDRATED DERMIS) Right 07/12/2013   Procedure: RIGHT BREAST RECONSTRUCTION WITH PLACEMENT OF TISSUE EXPANDER AND FLEX HD TO RIGHT BREAST (ACELLULAR HYDRATED DERMIS);  Surgeon: Irene Limbo, MD;  Location: [place];  Service: Plastics;  Laterality: Right;   BREAST SURGERY     for abcesses- removed from both breasts, many yrs. ago   CHOLECYSTECTOMY     COLONOSCOPY   01/29/2004   NUR:A 7-8 mm polyp snared from the hepatic flexure.  Another 3 mm polyp was     cold snared from the sigmoid colon.External hemorrhoids, possible source of recent rectal bleeding   COLONOSCOPY N/A 06/05/2014   Procedure: COLONOSCOPY;  Surgeon: Danie Binder, MD;  [facility];  Service: Endoscopy;  Laterality: N/A;  1030 - moved to 10:45 - Ginger to notify pt    COLONOSCOPY WITH PROPOFOL N/A 10/17/2017   Procedure: COLONOSCOPY WITH PROPOFOL;  Surgeon: Danie Binder, MD;  [facility];  Service: Endoscopy;  Laterality: N/A;  10:30am   DILATION AND CURETTAGE OF UTERUS     LUMBAR SPINE SURGERY     MASTECTOMY Right    MASTECTOMY W/ SENTINEL NODE BIOPSY Right 07/12/2013   Procedure: MASTECTOMY WITH SENTINEL LYMPH NODE BIOPSY;  Surgeon: Rolm Bookbinder, MD;  Location: [place];  Service: General;  Laterality: Right;   MASTOPEXY Left 01/14/2014   Procedure: LEFT BREAST MASTOPEXY FOR SYMMETRY ;  Surgeon: Irene Limbo, MD;  Location: [place];  Service: Plastics;  Laterality: Left;    PORT-A-CATH REMOVAL Left 01/14/2014   Procedure: REMOVAL PORT-A-CATH;  Surgeon: Irene Limbo, MD;  Location: [place];  Service: Plastics;  Laterality: Left;   PORTACATH PLACEMENT N/A 09/16/2013   Procedure: INSERTION PORT-A-CATH;  Surgeon: Rolm Bookbinder, MD;  Location: [place];  Service: General;  Laterality: N/A;   REDUCTION MAMMAPLASTY Left    REMOVAL OF BILATERAL TISSUE EXPANDERS WITH PLACEMENT OF BILATERAL BREAST IMPLANTS Right 01/14/2014   Procedure: REMOVAL OF RIGHT TISSUE EXPANDERS WITH PLACEMENT OF PERMANENT BREAST IMPLANT;  Surgeon: Irene Limbo, MD;  Location: [place];  Service: Plastics;  Laterality: Right;   TUBAL LIGATION     VAGINAL HYSTERECTOMY      Family History  Problem Relation Age of Onset   Hypertension Mother  Diabetes type II Mother    CVA Mother    Heart disease Mother    Osteoarthritis Mother    Depression Mother    Other Mother        degenerative disc disease   Alcoholism Father    Pneumonia Father    Diabetes Sister    Hyperlipidemia Sister    Hypertension Sister    Diabetes Sister    Hypertension Sister    Diabetes Sister    Hypertension Sister    Diabetes Sister    Kidney disease Sister    Cancer Brother    Colon cancer Maternal Aunt        age 75s   Colon cancer Other        maternal great aunt   Colon polyps Neg Hx     Allergies as of 10/12/2022 - Review Complete 10/12/2022  Allergen Reaction Noted   Codeine Nausea And Vomiting 06/19/2013   Crestor [rosuvastatin calcium]  02/06/2015   Lipitor [atorvastatin]  02/06/2015   Namenda [memantine hcl]  10/02/2017   Peanut-containing drug products  10/02/2017   Multihance [gadobenate] Nausea Only and Cough 06/18/2013    Social History   Socioeconomic History   Marital status: Married    Spouse name: [name]   Number of children: 4   Years of education: 12th   Highest education level: High school graduate  Occupational History   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   Right-handed.   1 cup coffee per day, occasional soda.   Social Determinants of Health   Financial Resource Strain: Low Risk  (10/22/2021)   Overall Financial Resource Strain (CARDIA)    Difficulty of Paying Living Expenses: Not very hard  Food Insecurity: No Food Insecurity (10/22/2021)   Hunger Vital Sign    Worried About Running Out of Food in the Last Year: Never true    Ran Out of Food in the Last Year: Never true  Transportation Needs: No Transportation Needs (10/22/2021)   PRAPARE - Hydrologist (Medical): No    Lack of Transportation (Non-Medical): No  Physical Activity: Insufficiently Active (10/22/2021)   Exercise Vital Sign    Days of Exercise per Week: 3 days    Minutes of Exercise per Session: 30 min  Stress: Stress Concern Present (11/23/2021)   [place]    Feeling of Stress : Rather much  Social Connections: Socially Integrated (10/22/2021)   Social Connection and Isolation Panel [NHANES]    Frequency of Communication with Friends and Family: More than three times a week    Frequency of Social Gatherings with Friends and Family: More than three times a week    Attends Religious Services: More than 4 times per year  Active Member of Clubs or Organizations: Yes    Attends Archivist Meetings: More than 4 times per year     Marital Status: Married  Human resources officer Violence: Not At Risk (10/22/2021)   Humiliation, Afraid, Rape, and Kick questionnaire    Fear of Current or Ex-Partner: No    Emotionally Abused: No    Physically Abused: No    Sexually Abused: No     Review of Systems   Gen: Denies any fever, chills, fatigue, weight loss, lack of appetite.  CV: Denies chest pain, heart palpitations, peripheral edema, syncope.  Resp: Denies shortness of breath at rest or with exertion. Denies wheezing or cough.  GI: see HPI GU : Denies urinary burning, urinary frequency, urinary hesitancy MS: Denies joint pain, muscle weakness, cramps, or limitation of movement.  Derm: Denies rash, itching, dry skin Psych: Denies depression, anxiety, memory loss, and confusion Heme: Denies bruising, bleeding, and enlarged lymph nodes.   Physical Exam   BP 131/76   Pulse (!) 54   Temp (!) 97.5 F (36.4 C)   Ht 5' 9.5" (1.765 m)   Wt 256 lb 12.8 oz (116.5 kg)   BMI 37.38 kg/m   General:   Alert and oriented. Pleasant and cooperative. Well-nourished and well-developed.  Head:  Normocephalic and atraumatic. Eyes:  Without icterus, sclera clear and conjunctiva pink.  Ears:  Normal auditory acuity. Mouth:  No deformity or lesions, oral mucosa pink.  Lungs:  Clear to auscultation bilaterally. No wheezes, rales, or rhonchi. No distress.  Heart:  S1, S2 present without murmurs appreciated.  Abdomen:  +BS, soft, non-tender and non-distended. No HSM noted. No guarding or rebound. No masses appreciated.  Rectal:  Deferred  Msk:  Symmetrical without gross deformities. Normal posture. Extremities:  Without edema. Neurologic:  Alert and  oriented x4;  grossly normal neurologically. Skin:  Intact without significant lesions or rashes. Psych:  Alert and cooperative. Normal mood and affect.   Assessment   Morgan Santos is a 72 y.o. female with a history of anxiety/depression, asthma, breast cancer, CAD, HTN, HLD, grade 1  diastolic dysfunction, stroke, GERD, constipation, adenomatous colon polyps, presenting today to discuss scheduling surveillance colonoscopy.  History of colon polyps family history of colon cancer: Last colonoscopy in 2019 without polyps.  Had 9 polyps removed.  Has strong family history of colon cancer.  No alarm symptoms present.  Will proceed with surveillance colonoscopy.  We discussed potential for at least 1 more colonoscopy after this 1 depending on results given her strong family history.  Intermittent constipation: Having intermittent issues, has been taking MiraLAX 1 capful daily.  Since starting MiraLAX she may go twice a day at times, stools appear to be skinnier than usual but is having larger amounts than previously.  GERD: Usually controlled with apple cider vinegar 3 times weekly.  Has cut back on his experiencing some nocturnal symptoms.  She states she will resume this for prescription management.  GERD diet reinforced.  PLAN   Proceed with colonoscopy with propofol by Dr. Abbey Chatters  in near future: the risks, benefits, and alternatives have been discussed with the patient in detail. The patient states understanding and desires to proceed.  ASA 3 Cardiology clearance prior to scheduling GERD diet reinforced Miralax 17g once daily, may decrease to half cap daily if stools become too loose or too frequent. Follow up as needed   Venetia Night, MSN, FNP-BC, AGACNP-BC [facility]

## 2022-10-12 NOTE — Telephone Encounter (Signed)
noted 

## 2022-10-12 NOTE — Telephone Encounter (Signed)
  Request for patient to stop medication prior to procedure or is needing cleareance  10/12/22  Morgan Santos [birth year]  What type of surgery is being performed? Colonoscopy  When is surgery scheduled? TBD  Patient needs to have cardiac clearance prior to scheduling procedure.  Name of physician performing surgery?  [facility]: [phone] Fax: [phone]  Anethesia type (none, local, MAC, general)? MAC

## 2022-10-12 NOTE — Telephone Encounter (Signed)
  Request for patient to have cardiac clearance prior to procedure.     10/12/22   LAQUINA SHORTRIDGE 1951-01-05   What type of surgery is being performed? Colonoscopy   When is surgery scheduled? TBD   Patient needs to have cardiac clearance prior to scheduling procedure.   Name of physician performing surgery?  [facility] at [employer]: [phone] Fax: [phone]   Anethesia type (none, local, MAC, general)? MAC

## 2022-10-12 NOTE — Patient Instructions (Signed)
Schedule you for colonoscopy for Dr. Abbey Chatters after we receive clearance from cardiology.  You may continue your apple cider vinegar and water 3 times a week if this helps control your reflux.  Follow a GERD diet:  Avoid fried, fatty, greasy, spicy, citrus foods. Avoid caffeine and carbonated beverages. Avoid chocolate. Try eating 4-6 small meals a day rather than 3 large meals. Do not eat within 3 hours of laying down. Prop head of bed up on wood or bricks to create a 6 inch incline.  Continue taking MiraLAX 1 capful daily.  If your stools become too loose or too frequent you may begin taking one half capful daily.  It was a pleasure to meet you today!  I want to create trusting relationships with patients. If you receive a survey regarding your visit,  I greatly appreciate you taking time to fill this out on paper or through your MyChart. I value your feedback.  Venetia Night, MSN, FNP-BC, AGACNP-BC [facility]

## 2022-10-12 NOTE — Telephone Encounter (Signed)
This is supposed to be for operative clearance given her irregular heart rhythms.

## 2022-10-12 NOTE — Telephone Encounter (Signed)
Please advise. Thank you

## 2022-10-12 NOTE — Telephone Encounter (Signed)
Hi Dr. Radford Pax,  You saw this patient on 10/07/2022. Will you please comment on clearance for upcoming colonoscopy.   I will communicate with requesting office once you have given recommendations.   Thank you!  Mayra Reel, NP

## 2022-10-13 NOTE — Telephone Encounter (Signed)
Please advise. Thank you

## 2022-10-13 NOTE — Telephone Encounter (Signed)
   Patient Name: Morgan Santos  DOB: Jan 13, 1951 MRN: [id number]  Primary Cardiologist: Fransico Him, MD  Chart reviewed as part of pre-operative protocol coverage. Given past medical history and time since last visit, based on ACC/AHA guidelines, and per Dr. Radford Pax, primary cardiologist, Kathlen Brunswick is at acceptable risk for the planned procedure without further cardiovascular testing.   Per office protocol, she may hold Aspirin for 5-7 days prior to procedure. Please resume Aspirin as soon as possible postprocedure, at the discretion of the surgeon.   I will route this recommendation to the requesting party via Epic fax function and remove from pre-op pool.  Please call with questions.  Lenna Sciara, NP 10/13/2022, 9:29 AM

## 2022-10-14 NOTE — Telephone Encounter (Signed)
Pt informed of providers recommendations and 3 weeks out will be 11/18/22 and provider is not there that day. Advised pt will call once we get providers schedule for April. Verbalized understanding

## 2022-10-14 NOTE — Telephone Encounter (Signed)
[facility]  TCS w/Dr.Carver, ASA 3, hx: colon polyps

## 2022-10-14 NOTE — Telephone Encounter (Signed)
Pt says she will be having a procedure on 10/28/22 where a breast implant will be removed and she will be "put to sleep" . I was going to schedule her for 11/04/22 for her colonoscopy. Will this be ok or do I need to look further out. Please advise. Thank you

## 2022-10-20 ENCOUNTER — Ambulatory Visit: Payer: Medicare PPO | Attending: Cardiovascular Disease | Admitting: Cardiovascular Disease

## 2022-10-20 ENCOUNTER — Encounter: Payer: Self-pay | Admitting: Radiology

## 2022-10-20 ENCOUNTER — Encounter: Payer: Self-pay | Admitting: Cardiovascular Disease

## 2022-10-20 VITALS — BP 132/88 | HR 59 | Ht 69.5 in | Wt 256.0 lb

## 2022-10-20 DIAGNOSIS — I455 Other specified heart block: Secondary | ICD-10-CM

## 2022-10-20 NOTE — Patient Instructions (Signed)
Medication Instructions:  Your physician recommends that you continue on your current medications as directed. Please refer to the Current Medication list given to you today.  *If you need a refill on your cardiac medications before your next appointment, please call your pharmacy*   Lab Work: None ordered   Testing/Procedures: None ordered   Follow-Up: At [facility], you and your health needs are our priority.  As part of our continuing mission to provide you with exceptional heart care, we have created designated Provider Care Teams.  These Care Teams include your primary Cardiologist (physician) and Advanced Practice Providers (APPs -  Physician Assistants and Nurse Practitioners) who all work together to provide you with the care you need, when you need it.  Your next appointment:   1 year(s)  The format for your next appointment:   In Person  Provider:   Doralee Albino, MD    Thank you for choosing [facility]!!   [phone]

## 2022-10-20 NOTE — Progress Notes (Signed)
Electrophysiology Office Note:    Date:  10/20/2022   ID:  Morgan Santos, DOB [birth year], MRN [id number]  PCP:  Antony Blackbird, MD   [place] [facility] Cardiologist:  Fransico Him, MD     Referring MD: Sueanne Margarita, MD   History of Present Illness:    Morgan Santos is a 72 y.o. female with a hx listed below, significant for mild OSA, CAD, referred for arrhythmia management.  She underwent a sleep study that showed an episode of possible atrial fibrillation lasting a minute 30 seconds.  A 13-day monitor was placed to evaluate more definitively.  This showed brief nonsustained VT lasting up to 11 beats and brief episodes of bradycardia, largely occurring during sleeping hours.  There was 1 patient triggered event at 11:16 PM.  The event correlated with brief pauses, but strips are nondiagnostic.   She recalls that she triggered her monitor on 1 occasions because she was having "flip-flops" in her chest.  This sensation occurs maybe once every 2 weeks, particularly in the setting of stress, and is not terribly concerning for her.  She has not had any chest pain, syncope, presyncope, lightheadedness, paroxysmal fatigue.  She does note that she has poor exertional tolerance but attributes this to being overweight and out of shape.  Past Medical History:  Diagnosis Date   Anemia    when she was younger   Anxiety    Arthritis    hip- R, back; osteoarthritis   Asthma    Breast cancer ([place])    right   CAD (coronary artery disease), native coronary artery    25-49% LCx by CTA [id number]   Complication of anesthesia    Constipation    Depression    pt. remarks that she "takes respridal because if I don't take it I get angry"    GERD (gastroesophageal reflux disease)    pt. uses vinegar for heartburn, ususally once per week    H/O degenerative disc disease    Hyperlipidemia    Hypertension    "when I get upset"  Not on medication   Memory loss    PONV (postoperative nausea  and vomiting)    "just for one day"   Stroke [facility]) ?2010 or 2011   2010, [facility] for stroke "mini stroke" short term memory problems since    Past Surgical History:  Procedure Laterality Date   BREAST RECONSTRUCTION WITH PLACEMENT OF TISSUE EXPANDER AND FLEX HD (ACELLULAR HYDRATED DERMIS) Right 07/12/2013   Procedure: RIGHT BREAST RECONSTRUCTION WITH PLACEMENT OF TISSUE EXPANDER AND FLEX HD TO RIGHT BREAST (ACELLULAR HYDRATED DERMIS);  Surgeon: Irene Limbo, MD;  Location: [place];  Service: Plastics;  Laterality: Right;   BREAST SURGERY     for abcesses- removed from both breasts, many yrs. ago   CHOLECYSTECTOMY     COLONOSCOPY   01/29/2004   NUR:A 7-8 mm polyp snared from the hepatic flexure.  Another 3 mm polyp was     cold snared from the sigmoid colon.External hemorrhoids, possible source of recent rectal bleeding   COLONOSCOPY N/A 06/05/2014   Procedure: COLONOSCOPY;  Surgeon: Danie Binder, MD;  [facility];  Service: Endoscopy;  Laterality: N/A;  1030 - moved to 10:45 - Ginger to notify pt    COLONOSCOPY WITH PROPOFOL N/A 10/17/2017   Procedure: COLONOSCOPY WITH PROPOFOL;  Surgeon: Danie Binder, MD;  [facility];  Service: Endoscopy;  Laterality: N/A;  10:30am   DILATION AND  CURETTAGE OF UTERUS     LUMBAR SPINE SURGERY     MASTECTOMY Right    MASTECTOMY W/ SENTINEL NODE BIOPSY Right 07/12/2013   Procedure: MASTECTOMY WITH SENTINEL LYMPH NODE BIOPSY;  Surgeon: Rolm Bookbinder, MD;  Location: [place];  Service: General;  Laterality: Right;   MASTOPEXY Left 01/14/2014   Procedure: LEFT BREAST MASTOPEXY FOR SYMMETRY ;  Surgeon: Irene Limbo, MD;  Location: [place];  Service: Plastics;  Laterality: Left;   PORT-A-CATH REMOVAL Left 01/14/2014   Procedure: REMOVAL PORT-A-CATH;  Surgeon: Irene Limbo, MD;  Location: [place];  Service: Plastics;  Laterality: Left;   PORTACATH PLACEMENT N/A 09/16/2013    Procedure: INSERTION PORT-A-CATH;  Surgeon: Rolm Bookbinder, MD;  Location: [place];  Service: General;  Laterality: N/A;   REDUCTION MAMMAPLASTY Left    REMOVAL OF BILATERAL TISSUE EXPANDERS WITH PLACEMENT OF BILATERAL BREAST IMPLANTS Right 01/14/2014   Procedure: REMOVAL OF RIGHT TISSUE EXPANDERS WITH PLACEMENT OF PERMANENT BREAST IMPLANT;  Surgeon: Irene Limbo, MD;  Location: [place];  Service: Plastics;  Laterality: Right;   TUBAL LIGATION     VAGINAL HYSTERECTOMY      Current Medications: Current Meds  Medication Sig   albuterol (VENTOLIN HFA) 108 (90 Base) MCG/ACT inhaler USE 2 PUFFS EVERY 6 HOURS AS NEEDED   ALPRAZolam (XANAX) 0.5 MG tablet Take 0.5-1 tablets (0.25-0.5 mg total) by mouth 2 (two) times daily as needed for anxiety.   ARTIFICIAL TEAR OP Place 1 drop into both eyes daily as needed (dry eyes).   aspirin EC 81 MG tablet Take 81 mg by mouth every other day.   budesonide-formoterol (SYMBICORT) 160-4.5 MCG/ACT inhaler Inhale 1 puff into the lungs 2 (two) times daily.   Calcium-Magnesium-Zinc 500-250-12.5 MG TABS Take 1 tablet by mouth daily.    Cholecalciferol (VITAMIN D-3) 5000 UNITS TABS Take 10,000 Units by mouth daily.    citalopram (CELEXA) 40 MG tablet Take 1 tablet (40 mg total) by mouth daily.   Cyanocobalamin (B-12) 2500 MCG SUBL Place 2,500 mcg under the tongue daily.   ezetimibe (ZETIA) 10 MG tablet Take 1 tablet (10 mg total) by mouth daily.   magnesium oxide (MAG-OX) 400 MG tablet Take 400 mg by mouth daily.   meloxicam (MOBIC) 7.5 MG tablet TAKE 1 TABLET EVERY DAY AS NEEDED FOR PAIN   pravastatin (PRAVACHOL) 80 MG tablet TAKE 1 TABLET EVERY DAY   solifenacin (VESICARE) 5 MG tablet Take 5 mg by mouth daily.     Allergies:   Codeine, Crestor [rosuvastatin calcium], Lipitor [atorvastatin], Namenda [memantine hcl], Peanut-containing drug products, and Multihance [gadobenate]   Social History   Socioeconomic History   Marital status:  Married    Spouse name: [name]   Number of children: 4   Years of education: 12th   Highest education level: High school graduate  Occupational History   Occupation: retired 2014    Comment: Charity fundraiser  Tobacco Use   Smoking status: Former    Packs/day: 0.25    Years: 2.00    Total pack years: 0.50    Types: Cigarettes    Quit date: 09/14/1995    Years since quitting: 27.1   Smokeless tobacco: Never  Vaping Use   Vaping Use: Never used  Substance and Sexual Activity   Alcohol use: Not Currently    Comment: maybe 3 beers a year   Drug use: No    Types: Marijuana    Comment: in the past   Sexual  activity: Yes  Other Topics Concern   Not on file  Social History Narrative   Lives at home with her husband.   Right-handed.   1 cup coffee per day, occasional soda.   Social Determinants of Health   Financial Resource Strain: Low Risk  (10/22/2021)   Overall Financial Resource Strain (CARDIA)    Difficulty of Paying Living Expenses: Not very hard  Food Insecurity: No Food Insecurity (10/22/2021)   Hunger Vital Sign    Worried About Running Out of Food in the Last Year: Never true    Ran Out of Food in the Last Year: Never true  Transportation Needs: No Transportation Needs (10/22/2021)   PRAPARE - Hydrologist (Medical): No    Lack of Transportation (Non-Medical): No  Physical Activity: Insufficiently Active (10/22/2021)   Exercise Vital Sign    Days of Exercise per Week: 3 days    Minutes of Exercise per Session: 30 min  Stress: Stress Concern Present (11/23/2021)   [place]    Feeling of Stress : Rather much  Social Connections: Socially Integrated (10/22/2021)   Social Connection and Isolation Panel [NHANES]    Frequency of Communication with Friends and Family: More than three times a week    Frequency of Social Gatherings with Friends and Family: More than three times a week     Attends Religious Services: More than 4 times per year    Active Member of [employer] or Organizations: Yes    Attends Music therapist: More than 4 times per year    Marital Status: Married     Family History: The patient's family history includes Alcoholism in her father; CVA in her mother; Cancer in her brother; Colon cancer in her maternal aunt and another family member; Depression in her mother; Diabetes in her sister, sister, sister, and sister; Diabetes type II in her mother; Heart disease in her mother; Hyperlipidemia in her sister; Hypertension in her mother, sister, sister, and sister; Kidney disease in her sister; Osteoarthritis in her mother; Other in her mother; Pneumonia in her father. There is no history of Colon polyps.  ROS:   Please see the history of present illness.    All other systems reviewed and are negative.  EKGs/Labs/Other Studies Reviewed Today:     Monitor: 09/2022 13 day Zio: Sinus rhythm HR 54-85, avg 57 bpm Heart block occurred between 3 AM and about 6 AM A patient triggered event at 11:16 PM on 1/15 corresponded with brief pauses. The mechanism is unclear due to inadequate length of strips. The differential includes sinus pauses or blocked PACs Brief NSVT occurred  TTE: 11/2021 EF 55-65% I also reviewed reports from the CT coronary and NM stress. NM stress reported an EF of 39%, disparate from that of the TTE   EKG:  Last EKG results: today - sinus rhythm with blocked PAC   Recent Labs: 08/30/2022: ALT 19; BUN 18; Creatinine, Ser 0.79; Hemoglobin 13.7; Platelets 190; Potassium 4.6; Sodium 142; TSH 2.130     Physical Exam:    VS:  BP 132/88   Pulse (!) 59   Ht 5' 9.5" (1.765 m)   Wt 256 lb (116.1 kg)   SpO2 98%   BMI 37.26 kg/m     Wt Readings from Last 3 Encounters:  10/20/22 256 lb (116.1 kg)  10/12/22 256 lb 12.8 oz (116.5 kg)  10/07/22 259 lb 12.8 oz (117.8 kg)  GEN:  Well nourished, well developed in no acute  distress CARDIAC: RRR, no murmurs, rubs, gallops RESPIRATORY:  Normal work of breathing MUSCULOSKELETAL: no edema    ASSESSMENT & PLAN:    Blocked PACs: fortunately, a brief pause occurred on her 12 lead ECG today and clearly shows a blocked PAC. I suspect this is what caused her palpitations while wearing the monitor. She is minimally symptomatic. No further intervention is necessary. Betablocker could be added though I do not see any strong indication to make any changes today NSVT: continue monitoring. EF is normal by TTE though she does have inferior scar. 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 CAD: with inferior/inferolateral perfusion defect     I will see her back in EP clinic in a year.   Medication Adjustments/Labs and Tests Ordered: Current medicines are reviewed at length with the patient today.  Concerns regarding medicines are outlined above.  Orders Placed This Encounter  Procedures   EKG 12-Lead   No orders of the defined types were placed in this encounter.    Signed, Melida Quitter, MD  10/20/2022 9:29 AM    [place]

## 2022-10-21 ENCOUNTER — Other Ambulatory Visit: Payer: Self-pay

## 2022-10-21 ENCOUNTER — Encounter (HOSPITAL_BASED_OUTPATIENT_CLINIC_OR_DEPARTMENT_OTHER): Payer: Self-pay | Admitting: Plastic Surgery

## 2022-10-27 NOTE — Anesthesia Preprocedure Evaluation (Addendum)
Anesthesia Evaluation  Patient identified by MRN, date of birth, ID band Patient awake    Reviewed: Allergy & Precautions, NPO status , Patient's Chart, lab work & pertinent test results  History of Anesthesia Complications (+) PONV and history of anesthetic complications  Airway Mallampati: I  TM Distance: >3 FB Neck ROM: Full    Dental  (+) Edentulous Upper, Edentulous Lower, Dental Advisory Given   Pulmonary former smoker   Pulmonary exam normal        Cardiovascular hypertension, Normal cardiovascular exam     Neuro/Psych  PSYCHIATRIC DISORDERS Anxiety Depression   Dementia TIA   GI/Hepatic negative GI ROS, Neg liver ROS,,,  Endo/Other  negative endocrine ROS    Renal/GU negative Renal ROS     Musculoskeletal negative musculoskeletal ROS (+)    Abdominal   Peds  Hematology negative hematology ROS (+)   Anesthesia Other Findings   Reproductive/Obstetrics                             Anesthesia Physical Anesthesia Plan  ASA: 3  Anesthesia Plan: General   Post-op Pain Management: Tylenol PO (pre-op)* and Toradol IV (intra-op)*   Induction: Intravenous  PONV Risk Score and Plan: 4 or greater and Ondansetron, Dexamethasone, Propofol infusion and Diphenhydramine  Airway Management Planned: LMA  Additional Equipment:   Intra-op Plan:   Post-operative Plan: Extubation in OR  Informed Consent: I have reviewed the patients History and Physical, chart, labs and discussed the procedure including the risks, benefits and alternatives for the proposed anesthesia with the patient or authorized representative who has indicated his/her understanding and acceptance.     Dental advisory given  Plan Discussed with: Anesthesiologist and CRNA  Anesthesia Plan Comments:         Anesthesia Quick Evaluation

## 2022-10-28 ENCOUNTER — Other Ambulatory Visit: Payer: Self-pay

## 2022-10-28 ENCOUNTER — Ambulatory Visit (HOSPITAL_BASED_OUTPATIENT_CLINIC_OR_DEPARTMENT_OTHER): Payer: Medicare PPO | Admitting: Certified Registered"

## 2022-10-28 ENCOUNTER — Encounter (HOSPITAL_BASED_OUTPATIENT_CLINIC_OR_DEPARTMENT_OTHER)
Admission: RE | Disposition: A | Discharge status: Home / Self Care | Payer: Self-pay | Source: Home / Self Care | Attending: Plastic Surgery

## 2022-10-28 ENCOUNTER — Ambulatory Visit (HOSPITAL_BASED_OUTPATIENT_CLINIC_OR_DEPARTMENT_OTHER)
Admission: RE | Admit: 2022-10-28 | Discharge: 2022-10-28 | Disposition: A | Discharge status: Home / Self Care | Payer: Medicare PPO | Attending: Plastic Surgery | Admitting: Plastic Surgery

## 2022-10-28 ENCOUNTER — Encounter (HOSPITAL_BASED_OUTPATIENT_CLINIC_OR_DEPARTMENT_OTHER): Payer: Self-pay | Admitting: Plastic Surgery

## 2022-10-28 DIAGNOSIS — Z853 Personal history of malignant neoplasm of breast: Secondary | ICD-10-CM

## 2022-10-28 DIAGNOSIS — F419 Anxiety disorder, unspecified: Secondary | ICD-10-CM | POA: Insufficient documentation

## 2022-10-28 DIAGNOSIS — F32A Depression, unspecified: Secondary | ICD-10-CM | POA: Diagnosis not present

## 2022-10-28 DIAGNOSIS — Z87891 Personal history of nicotine dependence: Secondary | ICD-10-CM | POA: Insufficient documentation

## 2022-10-28 DIAGNOSIS — Z9011 Acquired absence of right breast and nipple: Secondary | ICD-10-CM | POA: Diagnosis not present

## 2022-10-28 DIAGNOSIS — Z170 Estrogen receptor positive status [ER+]: Secondary | ICD-10-CM | POA: Diagnosis not present

## 2022-10-28 DIAGNOSIS — T8549XA Other mechanical complication of breast prosthesis and implant, initial encounter: Secondary | ICD-10-CM | POA: Diagnosis not present

## 2022-10-28 DIAGNOSIS — Y848 Other medical procedures as the cause of abnormal reaction of the patient, or of later complication, without mention of misadventure at the time of the procedure: Secondary | ICD-10-CM | POA: Diagnosis not present

## 2022-10-28 DIAGNOSIS — I10 Essential (primary) hypertension: Secondary | ICD-10-CM | POA: Diagnosis not present

## 2022-10-28 DIAGNOSIS — Z01818 Encounter for other preprocedural examination: Secondary | ICD-10-CM

## 2022-10-28 DIAGNOSIS — F418 Other specified anxiety disorders: Secondary | ICD-10-CM

## 2022-10-28 HISTORY — PX: CAPSULECTOMY: SHX5381

## 2022-10-28 LAB — NO BLOOD PRODUCTS

## 2022-10-28 SURGERY — CAPSULECTOMY, BREAST
Anesthesia: General | Site: Chest | Laterality: Right

## 2022-10-28 MED ORDER — BUPIVACAINE-EPINEPHRINE (PF) 0.25% -1:200000 IJ SOLN
INTRAMUSCULAR | Status: AC
  Filled 2022-10-28: qty 30

## 2022-10-28 MED ORDER — FENTANYL CITRATE (PF) 100 MCG/2ML IJ SOLN
INTRAMUSCULAR | Status: AC
  Filled 2022-10-28: qty 2

## 2022-10-28 MED ORDER — PROPOFOL 10 MG/ML IV BOLUS
INTRAVENOUS | Status: DC | PRN
  Administered 2022-10-28: 150 mg via INTRAVENOUS
  Administered 2022-10-28: 30 mg via INTRAVENOUS
  Administered 2022-10-28: 70 mg via INTRAVENOUS

## 2022-10-28 MED ORDER — LIDOCAINE HCL (CARDIAC) PF 100 MG/5ML IV SOSY
PREFILLED_SYRINGE | INTRAVENOUS | Status: DC | PRN
  Administered 2022-10-28: 100 mg via INTRAVENOUS

## 2022-10-28 MED ORDER — ACETAMINOPHEN 500 MG PO TABS: 1000.0000 mg | ORAL_TABLET | Freq: Once | ORAL | Status: AC

## 2022-10-28 MED ORDER — TRAMADOL HCL 50 MG PO TABS: 50.0000 mg | ORAL_TABLET | Freq: Four times a day (QID) | ORAL | 0 refills | Status: AC | PRN

## 2022-10-28 MED ORDER — LIDOCAINE 2% (20 MG/ML) 5 ML SYRINGE
INTRAMUSCULAR | Status: AC
  Filled 2022-10-28: qty 5

## 2022-10-28 MED ORDER — FENTANYL CITRATE (PF) 100 MCG/2ML IJ SOLN
INTRAMUSCULAR | Status: DC | PRN
  Administered 2022-10-28: 100 ug via INTRAVENOUS

## 2022-10-28 MED ORDER — ATROPINE SULFATE 0.4 MG/ML IV SOLN
INTRAVENOUS | Status: AC
  Filled 2022-10-28: qty 1

## 2022-10-28 MED ORDER — CHLORHEXIDINE GLUCONATE CLOTH 2 % EX PADS: 6.0000 | MEDICATED_PAD | Freq: Once | CUTANEOUS | Status: DC

## 2022-10-28 MED ORDER — ONDANSETRON HCL 4 MG/2ML IJ SOLN
INTRAMUSCULAR | Status: DC | PRN
  Administered 2022-10-28: 4 mg via INTRAVENOUS

## 2022-10-28 MED ORDER — ROCURONIUM BROMIDE 100 MG/10ML IV SOLN
INTRAVENOUS | Status: DC | PRN
  Administered 2022-10-28: 30 mg via INTRAVENOUS

## 2022-10-28 MED ORDER — GABAPENTIN 300 MG PO CAPS
ORAL_CAPSULE | ORAL | Status: AC
  Filled 2022-10-28: qty 1

## 2022-10-28 MED ORDER — GLYCOPYRROLATE PF 0.2 MG/ML IJ SOSY
PREFILLED_SYRINGE | INTRAMUSCULAR | Status: AC
  Filled 2022-10-28: qty 1

## 2022-10-28 MED ORDER — SUGAMMADEX SODIUM 500 MG/5ML IV SOLN
INTRAVENOUS | Status: AC
  Filled 2022-10-28: qty 5

## 2022-10-28 MED ORDER — EPHEDRINE SULFATE (PRESSORS) 50 MG/ML IJ SOLN
INTRAMUSCULAR | Status: DC | PRN
  Administered 2022-10-28 (×2): 5 mg via INTRAVENOUS

## 2022-10-28 MED ORDER — BUPIVACAINE HCL (PF) 0.25 % IJ SOLN
INTRAMUSCULAR | Status: AC
  Filled 2022-10-28: qty 30

## 2022-10-28 MED ORDER — FENTANYL CITRATE (PF) 100 MCG/2ML IJ SOLN
25.0000 ug | INTRAMUSCULAR | Status: DC | PRN
  Administered 2022-10-28 (×2): 25 ug via INTRAVENOUS

## 2022-10-28 MED ORDER — GLYCOPYRROLATE 0.2 MG/ML IJ SOLN
INTRAMUSCULAR | Status: DC | PRN
  Administered 2022-10-28: .2 mg via INTRAVENOUS

## 2022-10-28 MED ORDER — PROPOFOL 10 MG/ML IV BOLUS
INTRAVENOUS | Status: AC
  Filled 2022-10-28: qty 20

## 2022-10-28 MED ORDER — AMISULPRIDE (ANTIEMETIC) 5 MG/2ML IV SOLN: 10.0000 mg | Freq: Once | INTRAVENOUS | Status: DC | PRN

## 2022-10-28 MED ORDER — GABAPENTIN 300 MG PO CAPS
300.0000 mg | ORAL_CAPSULE | ORAL | Status: AC
  Administered 2022-10-28: 300 mg via ORAL

## 2022-10-28 MED ORDER — PROMETHAZINE HCL 25 MG/ML IJ SOLN: 6.2500 mg | INTRAMUSCULAR | Status: DC | PRN

## 2022-10-28 MED ORDER — SUGAMMADEX SODIUM 500 MG/5ML IV SOLN
INTRAVENOUS | Status: DC | PRN
  Administered 2022-10-28: 300 mg via INTRAVENOUS

## 2022-10-28 MED ORDER — SUCCINYLCHOLINE CHLORIDE 200 MG/10ML IV SOSY
PREFILLED_SYRINGE | INTRAVENOUS | Status: DC | PRN
  Administered 2022-10-28: 80 mg via INTRAVENOUS

## 2022-10-28 MED ORDER — CEFAZOLIN SODIUM-DEXTROSE 2-4 GM/100ML-% IV SOLN
INTRAVENOUS | Status: AC
  Filled 2022-10-28: qty 100

## 2022-10-28 MED ORDER — ROCURONIUM BROMIDE 10 MG/ML (PF) SYRINGE
PREFILLED_SYRINGE | INTRAVENOUS | Status: AC
  Filled 2022-10-28: qty 10

## 2022-10-28 MED ORDER — 0.9 % SODIUM CHLORIDE (POUR BTL) OPTIME
TOPICAL | Status: DC | PRN
  Administered 2022-10-28: 200 mL

## 2022-10-28 MED ORDER — BUPIVACAINE HCL (PF) 0.25 % IJ SOLN
INTRAMUSCULAR | Status: DC | PRN
  Administered 2022-10-28: 20 mL

## 2022-10-28 MED ORDER — ACETAMINOPHEN 500 MG PO TABS
ORAL_TABLET | ORAL | Status: AC
  Filled 2022-10-28: qty 2

## 2022-10-28 MED ORDER — LACTATED RINGERS IV SOLN: INTRAVENOUS | Status: DC

## 2022-10-28 MED ORDER — ONDANSETRON HCL 4 MG/2ML IJ SOLN
INTRAMUSCULAR | Status: AC
  Filled 2022-10-28: qty 2

## 2022-10-28 MED ORDER — DEXAMETHASONE SODIUM PHOSPHATE 10 MG/ML IJ SOLN
INTRAMUSCULAR | Status: DC | PRN
  Administered 2022-10-28: 8 mg via INTRAVENOUS

## 2022-10-28 MED ORDER — SUCCINYLCHOLINE CHLORIDE 200 MG/10ML IV SOSY
PREFILLED_SYRINGE | INTRAVENOUS | Status: AC
  Filled 2022-10-28: qty 10

## 2022-10-28 MED ORDER — EPHEDRINE 5 MG/ML INJ
INTRAVENOUS | Status: AC
  Filled 2022-10-28: qty 5

## 2022-10-28 MED ORDER — DEXAMETHASONE SODIUM PHOSPHATE 10 MG/ML IJ SOLN
INTRAMUSCULAR | Status: AC
  Filled 2022-10-28: qty 1

## 2022-10-28 MED ORDER — ACETAMINOPHEN 500 MG PO TABS
1000.0000 mg | ORAL_TABLET | ORAL | Status: AC
  Administered 2022-10-28: 1000 mg via ORAL

## 2022-10-28 MED ORDER — CEFAZOLIN SODIUM-DEXTROSE 2-4 GM/100ML-% IV SOLN
2.0000 g | INTRAVENOUS | Status: AC
  Administered 2022-10-28: 2 g via INTRAVENOUS

## 2022-10-28 SURGICAL SUPPLY — 62 items
ADH SKN CLS APL DERMABOND .7 (GAUZE/BANDAGES/DRESSINGS) ×1
APL PRP STRL LF DISP 70% ISPRP (MISCELLANEOUS) ×2
BAG DECANTER FOR FLEXI CONT (MISCELLANEOUS) IMPLANT
BINDER BREAST LRG (GAUZE/BANDAGES/DRESSINGS) IMPLANT
BINDER BREAST MEDIUM (GAUZE/BANDAGES/DRESSINGS) IMPLANT
BINDER BREAST XLRG (GAUZE/BANDAGES/DRESSINGS) IMPLANT
BINDER BREAST XXLRG (GAUZE/BANDAGES/DRESSINGS) IMPLANT
BLADE SURG 10 STRL SS (BLADE) ×1 IMPLANT
BLADE SURG 15 STRL LF DISP TIS (BLADE) IMPLANT
BLADE SURG 15 STRL SS (BLADE)
CANISTER SUCT 1200ML W/VALVE (MISCELLANEOUS) ×1 IMPLANT
CHLORAPREP W/TINT 26 (MISCELLANEOUS) ×1 IMPLANT
COVER BACK TABLE 60X90IN (DRAPES) ×1 IMPLANT
COVER MAYO STAND STRL (DRAPES) ×1 IMPLANT
DERMABOND ADVANCED .7 DNX12 (GAUZE/BANDAGES/DRESSINGS) ×1 IMPLANT
DRAIN CHANNEL 15F RND FF W/TCR (WOUND CARE) IMPLANT
DRAPE INCISE IOBAN 66X45 STRL (DRAPES) IMPLANT
DRAPE TOP ARMCOVERS (MISCELLANEOUS) ×1 IMPLANT
DRAPE U-SHAPE 76X120 STRL (DRAPES) ×1 IMPLANT
DRAPE UTILITY XL STRL (DRAPES) ×1 IMPLANT
ELECT BLADE 4.0 EZ CLEAN MEGAD (MISCELLANEOUS)
ELECT BLADE 6.5 EXT (BLADE) IMPLANT
ELECT COATED BLADE 2.86 ST (ELECTRODE) ×1 IMPLANT
ELECT REM PT RETURN 9FT ADLT (ELECTROSURGICAL) ×1
ELECTRODE BLDE 4.0 EZ CLN MEGD (MISCELLANEOUS) IMPLANT
ELECTRODE REM PT RTRN 9FT ADLT (ELECTROSURGICAL) ×1 IMPLANT
EVACUATOR SILICONE 100CC (DRAIN) IMPLANT
GAUZE PAD ABD 8X10 STRL (GAUZE/BANDAGES/DRESSINGS) ×1 IMPLANT
GAUZE SPONGE 4X4 12PLY STRL (GAUZE/BANDAGES/DRESSINGS) IMPLANT
GAUZE SPONGE 4X4 12PLY STRL LF (GAUZE/BANDAGES/DRESSINGS) IMPLANT
GLOVE BIO SURGEON STRL SZ 6 (GLOVE) ×1 IMPLANT
GLOVE BIOGEL PI IND STRL 7.0 (GLOVE) IMPLANT
GLOVE BIOGEL PI IND STRL 7.5 (GLOVE) IMPLANT
GOWN STRL REUS W/ TWL LRG LVL3 (GOWN DISPOSABLE) ×2 IMPLANT
GOWN STRL REUS W/TWL LRG LVL3 (GOWN DISPOSABLE) ×2
MARKER SKIN DUAL TIP RULER LAB (MISCELLANEOUS) IMPLANT
NDL HYPO 25X1 1.5 SAFETY (NEEDLE) IMPLANT
NEEDLE HYPO 25X1 1.5 SAFETY (NEEDLE) ×1 IMPLANT
NS IRRIG 1000ML POUR BTL (IV SOLUTION) IMPLANT
PACK BASIN DAY SURGERY FS (CUSTOM PROCEDURE TRAY) ×1 IMPLANT
PENCIL SMOKE EVACUATOR (MISCELLANEOUS) ×1 IMPLANT
PIN SAFETY STERILE (MISCELLANEOUS) IMPLANT
SHEET MEDIUM DRAPE 40X70 STRL (DRAPES) ×1 IMPLANT
SLEEVE SCD COMPRESS KNEE MED (STOCKING) ×1 IMPLANT
SPIKE FLUID TRANSFER (MISCELLANEOUS) IMPLANT
SPONGE T-LAP 18X18 ~~LOC~~+RFID (SPONGE) ×2 IMPLANT
STAPLER VISISTAT 35W (STAPLE) ×1 IMPLANT
STRIP CLOSURE SKIN 1/2X4 (GAUZE/BANDAGES/DRESSINGS) IMPLANT
SUT ETHILON 2 0 FS 18 (SUTURE) IMPLANT
SUT MNCRL AB 4-0 PS2 18 (SUTURE) ×1 IMPLANT
SUT VIC AB 3-0 PS1 18 (SUTURE) ×2
SUT VIC AB 3-0 PS1 18XBRD (SUTURE) IMPLANT
SUT VIC AB 3-0 SH 27 (SUTURE)
SUT VIC AB 3-0 SH 27X BRD (SUTURE) IMPLANT
SUT VICRYL 4-0 PS2 18IN ABS (SUTURE) ×1 IMPLANT
SUT VLOC 180 0 24IN GS25 (SUTURE) IMPLANT
SYR BULB IRRIG 60ML STRL (SYRINGE) ×1 IMPLANT
SYR CONTROL 10ML LL (SYRINGE) IMPLANT
TOWEL GREEN STERILE FF (TOWEL DISPOSABLE) ×2 IMPLANT
TUBE CONNECTING 20X1/4 (TUBING) ×1 IMPLANT
UNDERPAD 30X36 HEAVY ABSORB (UNDERPADS AND DIAPERS) ×2 IMPLANT
YANKAUER SUCT BULB TIP NO VENT (SUCTIONS) ×1 IMPLANT

## 2022-10-28 NOTE — Anesthesia Procedure Notes (Signed)
Procedure Name: Intubation Date/Time: 10/28/2022 7:41 AM  Performed by: Lavonia Dana, CRNAPre-anesthesia Checklist: Patient identified, Emergency Drugs available, Suction available and Patient being monitored Patient Re-evaluated:Patient Re-evaluated prior to induction Oxygen Delivery Method: Circle system utilized Preoxygenation: Pre-oxygenation with 100% oxygen Induction Type: IV induction Ventilation: Mask ventilation without difficulty Laryngoscope Size: Mac and 3 Grade View: Grade I Tube type: Oral Tube size: 7.0 mm Number of attempts: 1 Airway Equipment and Method: Stylet and Bite block Placement Confirmation: ETT inserted through vocal cords under direct vision, positive ETCO2 and breath sounds checked- equal and bilateral Secured at: 22 cm Tube secured with: Tape Dental Injury: Teeth and Oropharynx as per pre-operative assessment

## 2022-10-28 NOTE — Transfer of Care (Signed)
Immediate Anesthesia Transfer of Care Note  Patient: Morgan Santos  Procedure(s) Performed: CAPSULECTOMY AND REMOVAL OF RIGHT BREAST IMPLANT (Right: Chest)  Patient Location: PACU  Anesthesia Type:General  Level of Consciousness: drowsy  Airway & Oxygen Therapy: Patient Spontanous Breathing and Patient connected to face mask oxygen  Post-op Assessment: Report given to RN and Post -op Vital signs reviewed and stable  Post vital signs: Reviewed and stable  Last Vitals:  Vitals Value Taken Time  BP 152/87 10/28/22 0900  Temp 36.2 C 10/28/22 0859  Pulse 72 10/28/22 0902  Resp 20 10/28/22 0902  SpO2 95 % 10/28/22 0902  Vitals shown include unvalidated device data.  Last Pain:  Vitals:   10/28/22 0635  TempSrc: Oral  PainSc: 5       Patients Stated Pain Goal: 5 ([phone] [phone])  Complications: No notable events documented.

## 2022-10-28 NOTE — Anesthesia Postprocedure Evaluation (Signed)
Anesthesia Post Note  Patient: Morgan Santos  Procedure(s) Performed: CAPSULECTOMY AND REMOVAL OF RIGHT BREAST IMPLANT (Right: Chest)     Patient location during evaluation: PACU Anesthesia Type: General Level of consciousness: sedated Pain management: pain level controlled Vital Signs Assessment: post-procedure vital signs reviewed and stable Respiratory status: spontaneous breathing and respiratory function stable Cardiovascular status: stable Postop Assessment: no apparent nausea or vomiting Anesthetic complications: no  No notable events documented.  Last Vitals:  Vitals:   10/28/22 0945 10/28/22 0954  BP: (!) 132/90 (!) 151/91  Pulse: (!) 59 85  Resp: 12 16  Temp:  (!) 36.2 C  SpO2: 92% 95%    Last Pain:  Vitals:   10/28/22 0954  TempSrc: Oral  PainSc: 4                  Willona Phariss DANIEL

## 2022-10-28 NOTE — Discharge Instructions (Signed)
  Post Anesthesia Home Care Instructions  Activity: Get plenty of rest for the remainder of the day. A responsible individual must stay with you for 24 hours following the procedure.  For the next 24 hours, DO NOT: -Drive a car -Paediatric nurse -Drink alcoholic beverages -Take any medication unless instructed by your physician -Make any legal decisions or sign important papers.  Meals: Start with liquid foods such as gelatin or soup. Progress to regular foods as tolerated. Avoid greasy, spicy, heavy foods. If nausea and/or vomiting occur, drink only clear liquids until the nausea and/or vomiting subsides. Call your physician if vomiting continues.  Special Instructions/Symptoms: Your throat may feel dry or sore from the anesthesia or the breathing tube placed in your throat during surgery. If this causes discomfort, gargle with warm salt water. The discomfort should disappear within 24 hours.  If you had a scopolamine patch placed behind your ear for the management of post- operative nausea and/or vomiting:  1. The medication in the patch is effective for 72 hours, after which it should be removed.  Wrap patch in a tissue and discard in the trash. Wash hands thoroughly with soap and water. 2. You may remove the patch earlier than 72 hours if you experience unpleasant side effects which may include dry mouth, dizziness or visual disturbances. 3. Avoid touching the patch. Wash your hands with soap and water after contact with the patch.    *May have Tylenol today at 12:40pm 10/28/22

## 2022-10-28 NOTE — Op Note (Signed)
Operative Note   DATE OF OPERATION: 3.8.24  LOCATION: [facility]-outpatient  SURGICAL DIVISION: Plastic Surgery  PREOPERATIVE DIAGNOSES:  1. History breast cancer 2. Acquired absence breast 3. Mechanical complication breast implant  POSTOPERATIVE DIAGNOSES:  same  PROCEDURE:  1. Removal right breast implant, capsulectomy  SURGEON: Irene Limbo MD MBA  ASSISTANT: none  ANESTHESIA:  General.   EBL: 15 ml  COMPLICATIONS: None immediate.   INDICATIONS FOR PROCEDURE:  The patient, Morgan Santos, is a 72 y.o. female born on [birth year], is here for removal right breast implant. Patient has a history of implant based reconstruction and experienced recent deflation. Patient has elected for removal implant without replacement   FINDINGS: Removed microtextured saline implant from submuscular position. No seroma in cavity noted. Thin capsule noted. Partial capsulectomy performed to aid with adherence flaps.   DESCRIPTION OF PROCEDURE:  The patient's operative site was marked with the patient in the preoperative area including areas of redundant skin of mastectomy flaps for excision. The patient was taken to the operating room. SCDs were placed and IV antibiotics were given. The patient's operative site was prepped and draped in a sterile fashion. A time out was performed and all information was confirmed to be correct. Incision made in prior mastectomy scar and carried to pectoralis muscle. Muscle divided in direction fibers and capsule entered. Implant removed. Anterior capsule excised and partial posterior capsule excised. Cavity irrigated. Local anesthetic infiltrated. Hemostasis ensured. 15 Fr JP placed in submuscular position. This was secured to skin with 2-0 nylon. Pectoralis muscle inset to chest wall with 0 V lock suture. Skin marked for excision sharply excised. Closure completed with 3-0 vicryl in dermis and 4-0 monocryl subcuticular skin closure. Dermabond applied. Dry  dressing and breast binder applied.   The patient was allowed to wake from anesthesia, extubated and taken to the recovery room in satisfactory condition.   SPECIMENS: right mastectomy flap with capsule  DRAINS: 15 Fr JP in right chest sub pectoral position  Irene Limbo, MD [facility]astic [facility]  Office/ physician access line after hours [phone]

## 2022-10-28 NOTE — H&P (Addendum)
Subjective:  Patient ID: Morgan Santos is a 72 y.o. female.  HPI  9 years post op right mastectomy and expander/implant based reconstruction. Presents today for removal right chest implant following deflation implant first noted 07/2022.  Weight stable since last visit but over 20 lb increase since time implant placement.  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 Completed adjuvant chemotherapy. Anastrazole discontinued 02/2020, in part per chart patient expressed concern wt gain and hoping being off of this medication would help.  MMG left breast 11/2021 normal  Lives with husband who has Parkinsons  Review of Systems Remainder systems negative  Objective: Physical Exam Cardiovascular: Normal rate and normal heart sounds. Pulmonary/Chest: Effort normal and breath sounds normal.  Right chest deflated , no masses Left breast pseudoptosis, left breast> volume than right   Assessment:  Right breast ca UOQ ER+ S/p Right mastectomy, SLN, dual plane TE/ADM (Flex HD) reconstruction S/p implant saline exchange, left mastopexy Deflation right impant  Plan:  Reviewed options do nothing, removal implant without replacement, or remove/replace implant. If she does not desire further reconstruction, recommend she consider removal deflated implant as it is a foreign body and risk infection and may obscure masses or breast cancer surveillance. Patient has elected for removal alone. Will examine capsule intra op but possible capsulectomy. Reviewed OP surgery, drain placement. Reviewed  excision redundant soft tissue mastectomy flap. Counseled I do not anticipate all of this to contract/flatten with implant removal especially medial extent. Patient marked for limited excision of mastectomy flap and discussed will not be completely flat, will continue to have lateral chest wall soft tissue fullness and axillary fullness.  Reviewed she  has textured implant. Reviewed incidence of ALCL associated with her microtextured device.  Right breast Mentor Siltex Contour Profile implant, style 2700, Ref [id number] Fill volume 700 ml

## 2022-10-31 ENCOUNTER — Encounter (HOSPITAL_BASED_OUTPATIENT_CLINIC_OR_DEPARTMENT_OTHER): Payer: Self-pay | Admitting: Plastic Surgery

## 2022-10-31 ENCOUNTER — Other Ambulatory Visit: Payer: Medicare PPO

## 2022-10-31 LAB — SURGICAL PATHOLOGY

## 2022-10-31 NOTE — Telephone Encounter (Signed)
LMOVM to pt to call back

## 2022-11-01 ENCOUNTER — Other Ambulatory Visit: Payer: Self-pay | Admitting: *Deleted

## 2022-11-01 ENCOUNTER — Encounter: Payer: Self-pay | Admitting: *Deleted

## 2022-11-01 MED ORDER — PEG 3350-KCL-NA BICARB-NACL 420 G PO SOLR: 4000.0000 mL | Freq: Once | ORAL | 0 refills | Status: AC

## 2022-11-01 NOTE — Telephone Encounter (Signed)
Pt has been scheduled for 12/15/22. Instructions mailed and prep sent to the pharmacy.

## 2022-11-02 ENCOUNTER — Encounter: Payer: Self-pay | Admitting: *Deleted

## 2022-11-02 NOTE — Telephone Encounter (Signed)
[facility] PA: Approved Authorization [id number]  Tracking [id number]  DOS: 12/15/22-03/10/23

## 2022-11-29 ENCOUNTER — Other Ambulatory Visit: Payer: Self-pay | Admitting: Family Medicine

## 2022-11-29 DIAGNOSIS — Z1231 Encounter for screening mammogram for malignant neoplasm of breast: Secondary | ICD-10-CM

## 2022-12-12 NOTE — Patient Instructions (Signed)
Morgan Santos  12/12/2022     @   Your procedure is scheduled on 12/15/2022.  Report to [facility] at   A.M.  Call this number if you have problems the morning of surgery:  [phone]  If you experience any cold or flu symptoms such as cough, fever, chills, shortness of breath, etc. between now and your scheduled surgery, please notify us at the above number.   Remember:    Please follow the diet and prep instructions given to you by Dr Queen Bloss[facility] office.     Take these medicines the morning of surgery with A SIP OF WATER : Xanax Celexa Mobic and Vesicare   Please use your inhaler before you come and bring your rescue inhaler with you.    Do not wear jewelry, make-up or nail polish.  Do not wear lotions, powders, or perfumes, or deodorant.  Do not shave 48 hours prior to surgery.  Men may shave face and neck.  Do not bring valuables to the hospital.  [facility] is not responsible for any belongings or valuables.  Contacts, dentures or bridgework may not be worn into surgery.  Leave your suitcase in the car.  After surgery it may be brought to your room.  For patients admitted to the hospital, discharge time will be determined by your treatment team.  Patients discharged the day of surgery will not be allowed to drive home.   Name and phone number of your driver:   Family Special instructions:  N/A  Please read over the following fact sheets that you were given. Care and Recovery After Surgery  Colonoscopy, Adult A colonoscopy is a procedure to look at the entire large intestine. This procedure is done using a long, thin, flexible tube that has a camera on the end. You may have a colonoscopy: As a part of normal colorectal screening. If you have certain symptoms, such as: A low number of red blood cells in your blood (anemia). Diarrhea that does not go away. Pain in your abdomen. Blood in your stool. A colonoscopy can help screen for and diagnose  medical problems, including: An abnormal growth of cells or tissue (tumor). Abnormal growths within the lining of your intestine (polyps). Inflammation. Areas of bleeding. Tell your health care provider about: Any allergies you have. All medicines you are taking, including vitamins, herbs, eye drops, creams, and over-the-counter medicines. Any problems you or family members have had with anesthetic medicines. Any bleeding problems you have. Any surgeries you have had. Any medical conditions you have. Any problems you have had with having bowel movements. Whether you are pregnant or may be pregnant. What are the risks? Generally, this is a safe procedure. However, problems may occur, including: Bleeding. Damage to your intestine. Allergic reactions to medicines given during the procedure. Infection. This is rare. What happens before the procedure? Eating and drinking restrictions Follow instructions from your health care provider about eating or drinking restrictions, which may include: A few days before the procedure: Follow a low-fiber diet. Avoid nuts, seeds, dried fruit, raw fruits, and vegetables. 1-3 days before the procedure: Eat only gelatin dessert or ice pops. Drink only clear liquids, such as water, clear juice, clear broth or bouillon, black coffee or tea, or clear soft drinks or sports drinks. Avoid liquids that contain red or purple dye. The day of the procedure: Do not eat solid foods. You may continue to drink clear liquids until up to 2 hours before the procedure. Do  not eat or drink anything starting 2 hours before the procedure, or within the time period that your health care provider recommends. Bowel prep If you were prescribed a bowel prep to take by mouth (orally) to clean out your colon: Take it as told by your health care provider. Starting the day before your procedure, you will need to drink a large amount of liquid medicine. The liquid will cause you to  have many bowel movements of loose stool until your stool becomes almost clear or light green. If your skin or the opening between the buttocks (anus) gets irritated from diarrhea, you may relieve the irritation using: Wipes with medicine in them, such as adult wet wipes with aloe and vitamin E. A product to soothe skin, such as petroleum jelly. If you vomit while drinking the bowel prep: Take a break for up to 60 minutes. Begin the bowel prep again. Call your health care provider if you keep vomiting or you cannot take the bowel prep without vomiting. To clean out your colon, you may also be given: Laxative medicines. These help you have a bowel movement. Instructions for enema use. An enema is liquid medicine injected into your rectum. Medicines Ask your health care provider about: Changing or stopping your regular medicines or supplements. This is especially important if you are taking iron supplements, diabetes medicines, or blood thinners. Taking medicines such as aspirin and ibuprofen. These medicines can thin your blood. Do not take these medicines unless your health care provider tells you to take them. Taking over-the-counter medicines, vitamins, herbs, and supplements. General instructions Ask your health care provider what steps will be taken to help prevent infection. These may include washing skin with a germ-killing soap. If you will be going home right after the procedure, plan to have a responsible adult: Take you home from the hospital or clinic. You will not be allowed to drive. Care for you for the time you are told. What happens during the procedure?  An IV will be inserted into one of your veins. You will be given a medicine to make you fall asleep (general anesthetic). You will lie on your side with your knees bent. A lubricant will be put on the tube. Then the tube will be: Inserted into your anus. Gently eased through all parts of your large intestine. Air will be  sent into your colon to keep it open. This may cause some pressure or cramping. Images will be taken with the camera and will appear on a screen. A small tissue sample may be removed to be looked at under a microscope (biopsy). The tissue may be sent to a lab for testing if any signs of problems are found. If small polyps are found, they may be removed and checked for cancer cells. When the procedure is finished, the tube will be removed. The procedure may vary among health care providers and hospitals. What happens after the procedure? Your blood pressure, heart rate, breathing rate, and blood oxygen level will be monitored until you leave the hospital or clinic. You may have a small amount of blood in your stool. You may pass gas and have mild cramping or bloating in your abdomen. This is caused by the air that was used to open your colon during the exam. If you were given a sedative during the procedure, it can affect you for several hours. Do not drive or operate machinery until your health care provider says that it is safe. It is up to  you to get the results of your procedure. Ask your health care provider, or the department that is doing the procedure, when your results will be ready. Summary A colonoscopy is a procedure to look at the entire large intestine. Follow instructions from your health care provider about eating and drinking before the procedure. If you were prescribed an oral bowel prep to clean out your colon, take it as told by your health care provider. During the colonoscopy, a flexible tube with a camera on its end is inserted into the anus and then passed into all parts of the large intestine. This information is not intended to replace advice given to you by your health care provider. Make sure you discuss any questions you have with your health care provider. Document Revised: 08/02/2021 Document Reviewed: 03/31/2021 Elsevier Patient Education  2023 [employer]  Monitored Anesthesia Care Anesthesia refers to the techniques, procedures, and medicines that help a person stay safe and comfortable during surgery. Monitored anesthesia care, or sedation, is one type of anesthesia. You may have sedation if you do not need to be asleep for your procedure. Procedures that use sedation may include: Surgery to remove cataracts from your eyes. A dental procedure. A biopsy. This is when a tissue sample is removed and looked at under a microscope. You will be watched closely during your procedure. Your level of sedation or type of anesthesia may be changed to fit your needs. Tell a health care provider about: Any allergies you have. All medicines you are taking, including vitamins, herbs, eye drops, creams, and over-the-counter medicines. Any problems you or family members have had with anesthesia. Any bleeding problems you have. Any surgeries you have had. Any medical conditions or illnesses you have. This includes sleep apnea, cough, fever, or the flu. Whether you are pregnant or may be pregnant. Whether you use cigarettes, alcohol, or drugs. Any use of steroids, whether by mouth or as a cream. What are the risks? Your health care provider will talk with you about risks. These may include: Getting too much medicine (oversedation). Nausea. Allergic reactions to medicines. Trouble breathing. If this happens, a breathing tube may be used to help you breathe. It will be removed when you are awake and breathing on your own. Heart trouble. Lung trouble. Confusion that gets better with time (emergence delirium). What happens before the procedure? When to stop eating and drinking Follow instructions from your health care provider about what you may eat and drink. These may include: 8 hours before your procedure Stop eating most foods. Do not eat meat, fried foods, or fatty foods. Eat only light foods, such as toast or crackers. All liquids are okay except  energy drinks and alcohol. 6 hours before your procedure Stop eating. Drink only clear liquids, such as water, clear fruit juice, black coffee, plain tea, and sports drinks. Do not drink energy drinks or alcohol. 2 hours before your procedure Stop drinking all liquids. You may be allowed to take medicines with small sips of water. If you do not follow your health care provider's instructions, your procedure may be delayed or canceled. Medicines Ask your health care provider about: Changing or stopping your regular medicines. These include any diabetes medicines or blood thinners you take. Taking medicines such as aspirin and ibuprofen. These medicines can thin your blood. Do not take them unless your health care provider tells you to. Taking over-the-counter medicines, vitamins, herbs, and supplements. Testing You may have an exam or testing. You may  have a blood or urine sample taken. General instructions Do not use any products that contain nicotine or tobacco for at least 4 weeks before the procedure. These products include cigarettes, chewing tobacco, and vaping devices, such as e-cigarettes. If you need help quitting, ask your health care provider. If you will be going home right after the procedure, plan to have a responsible adult: Take you home from the hospital or clinic. You will not be allowed to drive. Care for you for the time you are told. What happens during the procedure?  Your blood pressure, heart rate, breathing, level of pain, and blood oxygen level will be monitored. An IV will be inserted into one of your veins. You may be given: A sedative. This helps you relax. Anesthesia. This will: Numb certain areas of your body. Make you fall asleep for surgery. You will be given medicines as needed to keep you comfortable. The more medicine you are given, the deeper your level of sedation will be. Your level of sedation may be changed to fit your needs. There are three  levels of sedation: Mild sedation. At this level, you may feel awake and relaxed. You will be able to follow directions. Moderate sedation. At this level, you will be sleepy. You may not remember the procedure. Deep sedation. At this level, you will be asleep. You will not remember the procedure. How you get the medicines will depend on your age and the procedure. They may be given as: A pill. This may be taken by mouth (orally) or inserted into the rectum. An injection. This may be into a vein or muscle. A spray through the nose. After your procedure is over, the medicine will be stopped. The procedure may vary among health care providers and hospitals. What happens after the procedure? Your blood pressure, heart rate, breathing rate, and blood oxygen level will be monitored until you leave the hospital or clinic. You may feel sleepy, clumsy, or nauseous. You may not remember what happened during or after the procedure. Sedation can affect you for several hours. Do not drive or use machinery until your health care provider says that it is safe. This information is not intended to replace advice given to you by your health care provider. Make sure you discuss any questions you have with your health care provider. Document Revised: 01/03/2022 Document Reviewed: 01/03/2022 Elsevier Patient Education  2023 [employer].

## 2022-12-13 ENCOUNTER — Other Ambulatory Visit: Payer: Self-pay

## 2022-12-13 ENCOUNTER — Encounter (HOSPITAL_COMMUNITY): Payer: Self-pay

## 2022-12-13 ENCOUNTER — Encounter (HOSPITAL_COMMUNITY)
Admission: RE | Admit: 2022-12-13 | Discharge: 2022-12-13 | Disposition: A | Discharge status: Home / Self Care | Payer: Medicare PPO | Source: Ambulatory Visit | Attending: Internal Medicine | Admitting: Internal Medicine

## 2022-12-13 VITALS — BP 126/58 | HR 40 | Temp 97.7°F | Resp 18 | Ht 69.5 in | Wt 252.0 lb

## 2022-12-13 DIAGNOSIS — Z01818 Encounter for other preprocedural examination: Secondary | ICD-10-CM | POA: Diagnosis not present

## 2022-12-13 DIAGNOSIS — I10 Essential (primary) hypertension: Secondary | ICD-10-CM | POA: Diagnosis not present

## 2022-12-13 DIAGNOSIS — D649 Anemia, unspecified: Secondary | ICD-10-CM | POA: Insufficient documentation

## 2022-12-13 LAB — CBC WITH DIFFERENTIAL/PLATELET
Abs Immature Granulocytes: 0 10*3/uL (ref 0.00–0.07)
Basophils Absolute: 0 10*3/uL (ref 0.0–0.1)
Basophils Relative: 1 %
Eosinophils Absolute: 0.1 10*3/uL (ref 0.0–0.5)
Eosinophils Relative: 3 %
HCT: 45.1 % (ref 36.0–46.0)
Hemoglobin: 14.5 g/dL (ref 12.0–15.0)
Immature Granulocytes: 0 %
Lymphocytes Relative: 36 %
Lymphs Abs: 1.5 10*3/uL (ref 0.7–4.0)
MCH: 29.6 pg (ref 26.0–34.0)
MCHC: 32.2 g/dL (ref 30.0–36.0)
MCV: 92 fL (ref 80.0–100.0)
Monocytes Absolute: 0.3 10*3/uL (ref 0.1–1.0)
Monocytes Relative: 7 %
Neutro Abs: 2.2 10*3/uL (ref 1.7–7.7)
Neutrophils Relative %: 53 %
Platelets: 206 10*3/uL (ref 150–400)
RBC: 4.9 MIL/uL (ref 3.87–5.11)
RDW: 13.1 % (ref 11.5–15.5)
WBC: 4.2 10*3/uL (ref 4.0–10.5)
nRBC: 0 % (ref 0.0–0.2)

## 2022-12-13 NOTE — Pre-Procedure Instructions (Signed)
Pat visit completed. Pt verbalized understanding of prep instructions and arrival time.

## 2022-12-15 ENCOUNTER — Ambulatory Visit (HOSPITAL_COMMUNITY)
Admission: RE | Admit: 2022-12-15 | Discharge: 2022-12-15 | Disposition: A | Discharge status: Home / Self Care | Payer: Medicare PPO | Attending: Internal Medicine | Admitting: Internal Medicine

## 2022-12-15 ENCOUNTER — Ambulatory Visit (HOSPITAL_BASED_OUTPATIENT_CLINIC_OR_DEPARTMENT_OTHER): Payer: Medicare PPO | Admitting: Anesthesiology

## 2022-12-15 ENCOUNTER — Ambulatory Visit (HOSPITAL_COMMUNITY): Payer: Medicare PPO | Admitting: Anesthesiology

## 2022-12-15 ENCOUNTER — Encounter (HOSPITAL_COMMUNITY): Payer: Self-pay

## 2022-12-15 ENCOUNTER — Encounter (HOSPITAL_COMMUNITY)
Admission: RE | Disposition: A | Discharge status: Home / Self Care | Payer: Self-pay | Source: Home / Self Care | Attending: Internal Medicine

## 2022-12-15 DIAGNOSIS — K649 Unspecified hemorrhoids: Secondary | ICD-10-CM

## 2022-12-15 DIAGNOSIS — I10 Essential (primary) hypertension: Secondary | ICD-10-CM | POA: Insufficient documentation

## 2022-12-15 DIAGNOSIS — Z86010 Personal history of colonic polyps: Secondary | ICD-10-CM | POA: Diagnosis not present

## 2022-12-15 DIAGNOSIS — K5730 Diverticulosis of large intestine without perforation or abscess without bleeding: Secondary | ICD-10-CM

## 2022-12-15 DIAGNOSIS — I2510 Atherosclerotic heart disease of native coronary artery without angina pectoris: Secondary | ICD-10-CM | POA: Insufficient documentation

## 2022-12-15 DIAGNOSIS — Z1211 Encounter for screening for malignant neoplasm of colon: Secondary | ICD-10-CM | POA: Diagnosis present

## 2022-12-15 DIAGNOSIS — D124 Benign neoplasm of descending colon: Secondary | ICD-10-CM

## 2022-12-15 DIAGNOSIS — D122 Benign neoplasm of ascending colon: Secondary | ICD-10-CM | POA: Diagnosis not present

## 2022-12-15 DIAGNOSIS — Z87891 Personal history of nicotine dependence: Secondary | ICD-10-CM | POA: Insufficient documentation

## 2022-12-15 DIAGNOSIS — I4891 Unspecified atrial fibrillation: Secondary | ICD-10-CM | POA: Insufficient documentation

## 2022-12-15 DIAGNOSIS — F419 Anxiety disorder, unspecified: Secondary | ICD-10-CM | POA: Diagnosis not present

## 2022-12-15 DIAGNOSIS — F32A Depression, unspecified: Secondary | ICD-10-CM | POA: Diagnosis not present

## 2022-12-15 DIAGNOSIS — Z8673 Personal history of transient ischemic attack (TIA), and cerebral infarction without residual deficits: Secondary | ICD-10-CM | POA: Insufficient documentation

## 2022-12-15 DIAGNOSIS — K644 Residual hemorrhoidal skin tags: Secondary | ICD-10-CM | POA: Insufficient documentation

## 2022-12-15 HISTORY — PX: POLYPECTOMY: SHX5525

## 2022-12-15 HISTORY — PX: COLONOSCOPY WITH PROPOFOL: SHX5780

## 2022-12-15 SURGERY — COLONOSCOPY WITH PROPOFOL
Anesthesia: General

## 2022-12-15 MED ORDER — LACTATED RINGERS IV SOLN: INTRAVENOUS | Status: DC

## 2022-12-15 MED ORDER — GLYCOPYRROLATE 0.2 MG/ML IJ SOLN
INTRAMUSCULAR | Status: DC | PRN
  Administered 2022-12-15 (×2): .1 mg via INTRAVENOUS

## 2022-12-15 MED ORDER — PROPOFOL 500 MG/50ML IV EMUL
INTRAVENOUS | Status: DC | PRN
  Administered 2022-12-15: 125 ug/kg/min via INTRAVENOUS

## 2022-12-15 MED ORDER — PROPOFOL 10 MG/ML IV BOLUS
INTRAVENOUS | Status: DC | PRN
  Administered 2022-12-15: 70 mg via INTRAVENOUS

## 2022-12-15 MED ORDER — LIDOCAINE HCL (CARDIAC) PF 100 MG/5ML IV SOSY
PREFILLED_SYRINGE | INTRAVENOUS | Status: DC | PRN
  Administered 2022-12-15: 50 mg via INTRAVENOUS

## 2022-12-15 NOTE — Transfer of Care (Signed)
Immediate Anesthesia Transfer of Care Note  Patient: Morgan Santos  Procedure(s) Performed: COLONOSCOPY WITH PROPOFOL POLYPECTOMY  Patient Location: Short Stay  Anesthesia Type:General  Level of Consciousness: awake and alert   Airway & Oxygen Therapy: Patient Spontanous Breathing  Post-op Assessment: Report given to RN and Post -op Vital signs reviewed and stable  Post vital signs: Reviewed and stable  Last Vitals:  Vitals Value Taken Time  BP 122/57 12/15/22 0756  Temp 36.6 C 12/15/22 0756  Pulse 36 12/15/22 0756  Resp 17 12/15/22 0756  SpO2 97 % 12/15/22 0756    Last Pain:  Vitals:   12/15/22 0756  TempSrc: Oral  PainSc: 0-No pain      Patients Stated Pain Goal: 6 (12/15/22 0705)  Complications: No notable events documented.

## 2022-12-15 NOTE — Anesthesia Preprocedure Evaluation (Signed)
Anesthesia Evaluation  Patient identified by MRN, date of birth, ID band Patient awake    Reviewed: Allergy & Precautions, H&P , NPO status , Patient's Chart, lab work & pertinent test results  History of Anesthesia Complications (+) PONV and history of anesthetic complications  Airway Mallampati: II  TM Distance: >3 FB Neck ROM: Full    Dental  (+) Edentulous Upper, Edentulous Lower   Pulmonary asthma , former smoker   Pulmonary exam normal breath sounds clear to auscultation       Cardiovascular Exercise Tolerance: Good hypertension, Pt. on medications + CAD  + dysrhythmias Atrial Fibrillation  Rhythm:Irregular Rate:Bradycardia     Neuro/Psych  PSYCHIATRIC DISORDERS Anxiety Depression   Dementia CVA, Residual Symptoms    GI/Hepatic Neg liver ROS,GERD  ,,  Endo/Other  negative endocrine ROS    Renal/GU negative Renal ROS  negative genitourinary   Musculoskeletal  (+) Arthritis , Osteoarthritis,    Abdominal   Peds negative pediatric ROS (+)  Hematology  (+) Blood dyscrasia, anemia   Anesthesia Other Findings   Reproductive/Obstetrics negative OB ROS                             Anesthesia Physical Anesthesia Plan  ASA: 3  Anesthesia Plan: General   Post-op Pain Management: Minimal or no pain anticipated   Induction: Intravenous  PONV Risk Score and Plan: 1 and Propofol infusion  Airway Management Planned: Nasal Cannula and Natural Airway  Additional Equipment:   Intra-op Plan:   Post-operative Plan:   Informed Consent: I have reviewed the patients History and Physical, chart, labs and discussed the procedure including the risks, benefits and alternatives for the proposed anesthesia with the patient or authorized representative who has indicated his/her understanding and acceptance.       Plan Discussed with: CRNA and Surgeon  Anesthesia Plan Comments:         Anesthesia Quick Evaluation

## 2022-12-15 NOTE — Discharge Instructions (Addendum)
  Colonoscopy Discharge Instructions  Read the instructions outlined below and refer to this sheet in the next few weeks. These discharge instructions provide you with general information on caring for yourself after you leave the hospital. Your doctor may also give you specific instructions. While your treatment has been planned according to the most current medical practices available, unavoidable complications occasionally occur.   ACTIVITY You may resume your regular activity, but move at a slower pace for the next 24 hours.  Take frequent rest periods for the next 24 hours.  Walking will help get rid of the air and reduce the bloated feeling in your belly (abdomen).  No driving for 24 hours (because of the medicine (anesthesia) used during the test).   Do not sign any important legal documents or operate any machinery for 24 hours (because of the anesthesia used during the test).  NUTRITION Drink plenty of fluids.  You may resume your normal diet as instructed by your doctor.  Begin with a light meal and progress to your normal diet. Heavy or fried foods are harder to digest and may make you feel sick to your stomach (nauseated).  Avoid alcoholic beverages for 24 hours or as instructed.  MEDICATIONS You may resume your normal medications unless your doctor tells you otherwise.  WHAT YOU CAN EXPECT TODAY Some feelings of bloating in the abdomen.  Passage of more gas than usual.  Spotting of blood in your stool or on the toilet paper.  IF YOU HAD POLYPS REMOVED DURING THE COLONOSCOPY: No aspirin products for 7 days or as instructed.  No alcohol for 7 days or as instructed.  Eat a soft diet for the next 24 hours.  FINDING OUT THE RESULTS OF YOUR TEST Not all test results are available during your visit. If your test results are not back during the visit, make an appointment with your caregiver to find out the results. Do not assume everything is normal if you have not heard from your  caregiver or the medical facility. It is important for you to follow up on all of your test results.  SEEK IMMEDIATE MEDICAL ATTENTION IF: You have more than a spotting of blood in your stool.  Your belly is swollen (abdominal distention).  You are nauseated or vomiting.  You have a temperature over 101.  You have abdominal pain or discomfort that is severe or gets worse throughout the day.   Your colonoscopy revealed 2 polyp(s) which I removed successfully. Await pathology results, my office will contact you. I recommend repeating colonoscopy in 5 years for surveillance purposes.   You also have diverticulosis and external hemorrhoids. I would recommend increasing fiber in your diet or adding OTC Benefiber/Metamucil. Be sure to drink at least 4 to 6 glasses of water daily. Follow-up with GI as needed.   I hope you have a great rest of your week!  Hennie Duos. Marletta Lor, D.O. Gastroenterology and Hepatology [facility]

## 2022-12-15 NOTE — Anesthesia Postprocedure Evaluation (Signed)
Anesthesia Post Note  Patient: Morgan Santos  Procedure(s) Performed: COLONOSCOPY WITH PROPOFOL POLYPECTOMY  Patient location during evaluation: Phase II Anesthesia Type: General Level of consciousness: awake and alert and oriented Pain management: pain level controlled Vital Signs Assessment: post-procedure vital signs reviewed and stable Respiratory status: spontaneous breathing, nonlabored ventilation and respiratory function stable Cardiovascular status: blood pressure returned to baseline and stable Postop Assessment: no apparent nausea or vomiting Anesthetic complications: no  No notable events documented.   Last Vitals:  Vitals:   12/15/22 0705 12/15/22 0756  BP: (!) 140/67 (!) 122/57  Pulse: (!) 40 (!) 36  Resp: 15 17  Temp: 36.9 C 36.6 C  SpO2: 98% 97%    Last Pain:  Vitals:   12/15/22 0756  TempSrc: Oral  PainSc: 0-No pain                 Bryanah Sidell C Kaleem Sartwell

## 2022-12-15 NOTE — H&P (Signed)
Primary Care Physician:  Lourena Simmonds, Donald Pore, MD Primary Gastroenterologist:  Dr. Marletta Lor  Pre-Procedure History & Physical: HPI:  Morgan Santos is a 72 y.o. female is here for a colonoscopy to be performed for surveillance purposes, personal history of adenomatous colon polyps in 2019  Past Medical History:  Diagnosis Date   Anemia    when she was younger   Anxiety    Arthritis    hip- R, back; osteoarthritis   Asthma    Breast cancer    right   CAD (coronary artery disease), native coronary artery    25-49% LCx by CTA 02/2022   Complication of anesthesia    Constipation    Depression    pt. remarks that she "takes respridal because if I don't take it I get angry"    GERD (gastroesophageal reflux disease)    pt. uses vinegar for heartburn, ususally once per week    H/O degenerative disc disease    Hyperlipidemia    Hypertension    "when I get upset"  Not on medication   Memory loss    PONV (postoperative nausea and vomiting)    "just for one day"   Stroke ?2010 or 2011   2010, [facility] for stroke "mini stroke" short term memory problems since    Past Surgical History:  Procedure Laterality Date   BREAST RECONSTRUCTION WITH PLACEMENT OF TISSUE EXPANDER AND FLEX HD (ACELLULAR HYDRATED DERMIS) Right 07/12/2013   Procedure: RIGHT BREAST RECONSTRUCTION WITH PLACEMENT OF TISSUE EXPANDER AND FLEX HD TO RIGHT BREAST (ACELLULAR HYDRATED DERMIS);  Surgeon: Glenna Fellows, MD;  Location: [facility] OR;  Service: Plastics;  Laterality: Right;   BREAST SURGERY     for abcesses- removed from both breasts, many yrs. ago   CAPSULECTOMY Right 10/28/2022   Procedure: CAPSULECTOMY AND REMOVAL OF RIGHT BREAST IMPLANT;  Surgeon: Glenna Fellows, MD;  Location: [facility];  Service: Plastics;  Laterality: Right;   CHOLECYSTECTOMY     COLONOSCOPY   01/29/2004   NUR:A 7-8 mm polyp snared from the hepatic flexure.  Another 3 mm polyp was     cold snared from the sigmoid  colon.External hemorrhoids, possible source of recent rectal bleeding   COLONOSCOPY N/A 06/05/2014   Procedure: COLONOSCOPY;  Surgeon: West Bali, MD;  Location: [facility];  Service: Endoscopy;  Laterality: N/A;  1030 - moved to 10:45 - Ginger to notify pt    COLONOSCOPY WITH PROPOFOL N/A 10/17/2017   Procedure: COLONOSCOPY WITH PROPOFOL;  Surgeon: West Bali, MD;  Location: [facility];  Service: Endoscopy;  Laterality: N/A;  10:30am   DILATION AND CURETTAGE OF UTERUS     LUMBAR SPINE SURGERY     MASTECTOMY Right    MASTECTOMY W/ SENTINEL NODE BIOPSY Right 07/12/2013   Procedure: MASTECTOMY WITH SENTINEL LYMPH NODE BIOPSY;  Surgeon: Emelia Loron, MD;  Location: [facility] OR;  Service: General;  Laterality: Right;   MASTOPEXY Left 01/14/2014   Procedure: LEFT BREAST MASTOPEXY FOR SYMMETRY ;  Surgeon: Glenna Fellows, MD;  Location: [facility];  Service: Plastics;  Laterality: Left;   PORT-A-CATH REMOVAL Left 01/14/2014   Procedure: REMOVAL PORT-A-CATH;  Surgeon: Glenna Fellows, MD;  Location:  SURGERY CENTER;  Service: Plastics;  Laterality: Left;   PORTACATH PLACEMENT N/A 09/16/2013   Procedure: INSERTION PORT-A-CATH;  Surgeon: Emelia Loron, MD;  Location: [facility] OR;  Service: General;  Laterality: N/A;   REDUCTION MAMMAPLASTY Left    REMOVAL OF BILATERAL TISSUE  EXPANDERS WITH PLACEMENT OF BILATERAL BREAST IMPLANTS Right 01/14/2014   Procedure: REMOVAL OF RIGHT TISSUE EXPANDERS WITH PLACEMENT OF PERMANENT BREAST IMPLANT;  Surgeon: Glenna Fellows, MD;  Location: [facility];  Service: Plastics;  Laterality: Right;   TUBAL LIGATION     VAGINAL HYSTERECTOMY      Prior to Admission medications   Medication Sig Start Date End Date Taking? Authorizing Provider  albuterol (VENTOLIN HFA) 108 (90 Base) MCG/ACT inhaler USE 2 PUFFS EVERY 6 HOURS AS NEEDED 06/24/21  Yes Hawks, Christy A, FNP  ALPRAZolam (XANAX) 0.5 MG tablet Take 0.5-1 tablets  (0.25-0.5 mg total) by mouth 2 (two) times daily as needed for anxiety. Patient taking differently: Take 0.5 mg by mouth 2 (two) times daily as needed for anxiety. 04/09/21  Yes Gottschalk, Kathie Rhodes M, DO  ARTIFICIAL TEAR OP Place 1 drop into both eyes daily as needed (dry eyes).   Yes [provider]  Calcium-Magnesium-Zinc (CAL-MAG-ZINC PO) Take 1 tablet by mouth daily.   Yes [provider]  Cholecalciferol (VITAMIN D3) 250 MCG (10000 UT) capsule Take 20,000 Units by mouth daily.   Yes [provider]  citalopram (CELEXA) 40 MG tablet Take 1 tablet (40 mg total) by mouth daily. 03/26/21  Yes Delynn Flavin M, DO  diphenhydrAMINE (BENADRYL) 25 MG tablet Take 25 mg by mouth every 6 (six) hours as needed for allergies.   Yes [provider]  ezetimibe (ZETIA) 10 MG tablet Take 1 tablet (10 mg total) by mouth daily. 09/06/22  Yes Dyann Kief, PA-C  Magnesium 200 MG TABS Take 200 mg by mouth at bedtime.   Yes [provider]  meloxicam (MOBIC) 7.5 MG tablet TAKE 1 TABLET EVERY DAY AS NEEDED FOR PAIN Patient taking differently: Take 7.5 mg by mouth daily. 06/04/21  Yes Gottschalk, Kathie Rhodes M, DO  Methylcobalamin (B12) 5000 MCG SUBL Place 5,000 mcg under the tongue daily.   Yes [provider]  polyethylene glycol powder (GLYCOLAX/MIRALAX) 17 GM/SCOOP powder Take 17 g by mouth daily.   Yes [provider]  solifenacin (VESICARE) 5 MG tablet Take 5 mg by mouth daily. 09/29/22  Yes [provider]  vitamin E 180 MG (400 UNITS) capsule Take 400 Units by mouth daily.   Yes [provider]  pravastatin (PRAVACHOL) 80 MG tablet TAKE 1 TABLET EVERY DAY Patient not taking: Reported on 12/08/2022 06/03/21   Raliegh Ip, DO  traMADol (ULTRAM) 50 MG tablet Take 1 tablet (50 mg total) by mouth every 6 (six) hours as needed. Patient not taking: Reported on 12/08/2022 10/28/22 10/28/23  Glenna Fellows, MD    Allergies as of  11/01/2022 - Review Complete 10/28/2022  Allergen Reaction Noted   Codeine Nausea And Vomiting 06/19/2013   Crestor [rosuvastatin calcium]  02/06/2015   Lipitor [atorvastatin]  02/06/2015   Namenda [memantine hcl]  10/02/2017   Peanut-containing drug products  10/02/2017   Multihance [gadobenate] Nausea Only and Cough 06/18/2013    Family History  Problem Relation Age of Onset   Hypertension Mother    Diabetes type II Mother    CVA Mother    Heart disease Mother    Osteoarthritis Mother    Depression Mother    Other Mother        degenerative disc disease   Alcoholism Father    Pneumonia Father    Diabetes Sister    Hyperlipidemia Sister    Hypertension Sister    Diabetes Sister    Hypertension Sister  Diabetes Sister    Hypertension Sister    Diabetes Sister    Kidney disease Sister    Cancer Brother    Colon cancer Maternal Aunt        age 19s   Colon cancer Other        maternal great aunt   Colon polyps Neg Hx     Social History   Socioeconomic History   Marital status: Married    Spouse name: Iantha Fallen   Number of children: 4   Years of education: 12th   Highest education level: High school graduate  Occupational History   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   Right-handed.   1 cup coffee per day, occasional soda.   Social Determinants of Health   Financial Resource Strain: Low Risk  (10/22/2021)    Overall Financial Resource Strain (CARDIA)    Difficulty of Paying Living Expenses: Not very hard  Food Insecurity: No Food Insecurity (10/22/2021)   Hunger Vital Sign    Worried About Running Out of Food in the Last Year: Never true    Ran Out of Food in the Last Year: Never true  Transportation Needs: No Transportation Needs (10/22/2021)   PRAPARE - Administrator, Civil Service (Medical): No    Lack of Transportation (Non-Medical): No  Physical Activity: Insufficiently Active (10/22/2021)   Exercise Vital Sign    Days of Exercise per Week: 3 days    Minutes of Exercise per Session: 30 min  Stress: Stress Concern Present (11/23/2021)   [employer] of Occupational Health - Occupational Stress Questionnaire    Feeling of Stress : Rather much  Social Connections: Socially Integrated (10/22/2021)   Social Connection and Isolation Panel [NHANES]    Frequency of Communication with Friends and Family: More than three times a week    Frequency of Social Gatherings with Friends and Family: More than three times a week    Attends Religious Services: More than 4 times per year    Active Member of Clubs or Organizations: Yes    Attends Banker Meetings: More than 4 times per year    Marital Status: Married  Catering manager Violence: Not At Risk (10/22/2021)   Humiliation, Afraid, Rape, and Kick questionnaire    Fear of Current or Ex-Partner: No    Emotionally Abused: No    Physically Abused: No    Sexually Abused: No    Review of Systems: See HPI, otherwise negative ROS  Physical Exam: Vital signs in last 24 hours: Temp:  [98.5 F (36.9 C)] 98.5 F (36.9 C) (04/25 0705) Pulse Rate:  [40] 40 (04/25 0705) Resp:  [15] 15 (04/25 0705) BP: (140)/(67) 140/67 (04/25 0705) SpO2:  [98 %] 98 % (  04/25 0705) Weight:  [114.3 kg] 114.3 kg (04/25 0705)   General:   Alert,  Well-developed, well-nourished, pleasant and cooperative in NAD Head:  Normocephalic and  atraumatic. Eyes:  Sclera clear, no icterus.   Conjunctiva pink. Ears:  Normal auditory acuity. Nose:  No deformity, discharge,  or lesions. Msk:  Symmetrical without gross deformities. Normal posture. Extremities:  Without clubbing or edema. Neurologic:  Alert and  oriented x4;  grossly normal neurologically. Skin:  Intact without significant lesions or rashes. Psych:  Alert and cooperative. Normal mood and affect.  Impression/Plan: Morgan Santos is here for a colonoscopy to be performed for surveillance purposes, personal history of adenomatous colon polyps in 2019  The risks of the procedure including infection, bleed, or perforation as well as benefits, limitations, alternatives and imponderables have been reviewed with the patient. Questions have been answered. All parties agreeable.

## 2022-12-15 NOTE — Op Note (Signed)
[facility] Patient Name: Morgan Santos Procedure Date: 12/15/2022 7:08 AM MRN: [id number] Date of Birth: [birth year] Attending MD: Hennie Duos. Marletta Lor , [place], [id number] CSN: [id number] Age: 72 Admit Type: Outpatient Procedure:                Colonoscopy Indications:              Surveillance: Personal history of adenomatous                            polyps on last colonoscopy 5 years ago Providers:                Hennie Duos. Marletta Lor, DO, Buel Ream. Museum/gallery exhibitions officer, Charity fundraiser,                            Judeth Cornfield. Jessee Avers, Technician Referring MD:              Medicines:                See the Anesthesia note for documentation of the                            administered medications Complications:            No immediate complications. Estimated Blood Loss:     Estimated blood loss was minimal. Procedure:                Pre-Anesthesia Assessment:                           - The anesthesia plan was to use monitored                            anesthesia care (MAC).                           After obtaining informed consent, the colonoscope                            was passed under direct vision. Throughout the                            procedure, the patient's blood pressure, pulse, and                            oxygen saturations were monitored continuously. The                            [id number] ([id number]) was introduced through the                            anus and advanced to the the cecum, identified by                            appendiceal orifice and ileocecal valve. The                            colonoscopy  was performed without difficulty. The                            patient tolerated the procedure well. The quality                            of the bowel preparation was evaluated using the                            BBPS [facility] Bowel Preparation Scale) with scores                            of: Right Colon = 3, Transverse Colon = 3 and Left                             Colon = 3 (entire mucosa seen well with no residual                            staining, small fragments of stool or opaque                            liquid). The total BBPS score equals 9. Scope In: 7:35:42 AM Scope Out: 7:51:52 AM Scope Withdrawal Time: 0 hours 10 minutes 51 seconds  Total Procedure Duration: 0 hours 16 minutes 10 seconds  Findings:      Hemorrhoids were found on perianal exam.      Multiple medium-mouthed diverticula were found in the sigmoid colon and       descending colon.      A 4 mm polyp was found in the ascending colon. The polyp was sessile.       The polyp was removed with a cold snare. Resection and retrieval were       complete.      A 5 mm polyp was found in the descending colon. The polyp was sessile.       The polyp was removed with a cold snare. Resection and retrieval were       complete.      The exam was otherwise without abnormality. Impression:               - Hemorrhoids found on perianal exam.                           - Diverticulosis in the sigmoid colon and in the                            descending colon.                           - One 4 mm polyp in the ascending colon, removed                            with a cold snare. Resected and retrieved.                           - One 5 mm polyp  in the descending colon, removed                            with a cold snare. Resected and retrieved.                           - The examination was otherwise normal. Moderate Sedation:      Per Anesthesia Care Recommendation:           - Patient has a contact number available for                            emergencies. The signs and symptoms of potential                            delayed complications were discussed with the                            patient. Return to normal activities tomorrow.                            Written discharge instructions were provided to the                            patient.                           - Resume  previous diet.                           - Continue present medications.                           - Await pathology results.                           - Repeat colonoscopy in 5 years for surveillance.                           - Return to GI clinic PRN. Procedure Code(s):        --- Professional ---                           [phone], Colonoscopy, flexible; with removal of                            tumor(s), polyp(s), or other lesion(s) by snare                            technique Diagnosis Code(s):        --- Professional ---                           Z86.010, Personal history of colonic polyps                           K64.9, Unspecified hemorrhoids  D12.2, Benign neoplasm of ascending colon                           D12.4, Benign neoplasm of descending colon                           K57.30, Diverticulosis of large intestine without                            perforation or abscess without bleeding CPT copyright 2022 American Medical Association. All rights reserved. The codes documented in this report are preliminary and upon coder review may  be revised to meet current compliance requirements. Hennie Duos. Marletta Lor, DO Hennie Duos. Marletta Lor, DO 12/15/2022 7:53:55 AM This report has been signed electronically. Number of Addenda: 0

## 2022-12-16 LAB — SURGICAL PATHOLOGY

## 2022-12-19 ENCOUNTER — Encounter (HOSPITAL_COMMUNITY): Payer: Self-pay | Admitting: Internal Medicine

## 2023-01-04 ENCOUNTER — Ambulatory Visit: Payer: Medicare PPO

## 2023-01-16 ENCOUNTER — Other Ambulatory Visit: Payer: Self-pay | Admitting: Cardiology

## 2023-01-16 DIAGNOSIS — E782 Mixed hyperlipidemia: Secondary | ICD-10-CM

## 2023-01-20 ENCOUNTER — Ambulatory Visit: Payer: Medicare PPO | Attending: Physician Assistant | Admitting: Physician Assistant

## 2023-01-20 ENCOUNTER — Encounter: Payer: Self-pay | Admitting: Physician Assistant

## 2023-01-20 NOTE — Progress Notes (Deleted)
Office Visit    Patient Name: Morgan Santos Date of Encounter: 01/20/2023  PCP:  Corliss Blacker, MD   [facility]  Cardiologist:  Armanda Magic, MD  Advanced Practice Provider:  No care team member to display Electrophysiologist:  None   HPI    Morgan Santos is a 72 y.o. female with a past medical history of mild OSA, CAD presents today for follow-up visit.  She underwent a sleep study that showed an episode of possible atrial fibrillation lasting about 30 seconds.  A 13-day monitor was placed to evaluate.  This showed a brief nonsustained VT episode lasting up to 11 beats.  Also had brief episodes of bradycardia, largely occurring during sleeping hours.  There was 1 patient triggered event that correlated with a brief pause but strips were nondiagnostic.  She was seen by Dr. Nelly Laurence 10/20/2022 and she recalled that she triggered her monitor on 1 occasion because she was having "flip-flops" in her chest.  Sensation occurred maybe every 2 weeks particularly in the setting of stress but not terribly concerning to her.  Not having any chest pain, syncope, presyncope, lightheadedness, paroxysmal fatigue.  She did note poor exertional tolerance but attributed this to being overweight and out of shape.  No further medication changes or procedures were ordered at this time.  She then had a witnessed arrest while eating with her family, CPR initiated immediately, ROSC approximately 10 minutes after CPR with 1 round of epi and 1 shock by AED.  Intubated.  On 513 she was successfully extubated to 2 L nasal cannula.  Was cleared for diet with aspiration precautions.  Taken for permanent pacemaker and ICD placement with EP through [facility] 5/17.  Repeat TTE showed some improvement in her LVEF 40 to 45%.  Cardiac MRI was obtained to further investigate etiology of arrest.  Did you get cardiac MRI? Echo in 2-3 months  Today, she ***  Past Medical History    Past  Medical History:  Diagnosis Date   Anemia    when she was younger   Anxiety    Arthritis    hip- R, back; osteoarthritis   Asthma    Breast cancer (HCC)    right   CAD (coronary artery disease), native coronary artery    25-49% LCx by CTA 02/2022   Complication of anesthesia    Constipation    Depression    pt. remarks that she "takes respridal because if I don't take it I get angry"    GERD (gastroesophageal reflux disease)    pt. uses vinegar for heartburn, ususally once per week    H/O degenerative disc disease    Hyperlipidemia    Hypertension    "when I get upset"  Not on medication   Memory loss    PONV (postoperative nausea and vomiting)    "just for one day"   Stroke Morgan Santos) ?2010 or 2011   2010, [facility] for stroke "mini stroke" short term memory problems since   Past Surgical History:  Procedure Laterality Date   BREAST RECONSTRUCTION WITH PLACEMENT OF TISSUE EXPANDER AND FLEX HD (ACELLULAR HYDRATED DERMIS) Right 07/12/2013   Procedure: RIGHT BREAST RECONSTRUCTION WITH PLACEMENT OF TISSUE EXPANDER AND FLEX HD TO RIGHT BREAST (ACELLULAR HYDRATED DERMIS);  Surgeon: Glenna Fellows, MD;  Location: [facility] OR;  Service: Plastics;  Laterality: Right;   BREAST SURGERY     for abcesses- removed from both breasts, many yrs. ago   CAPSULECTOMY  Right 10/28/2022   Procedure: CAPSULECTOMY AND REMOVAL OF RIGHT BREAST IMPLANT;  Surgeon: Glenna Fellows, MD;  Location: [facility];  Service: Plastics;  Laterality: Right;   CHOLECYSTECTOMY     COLONOSCOPY   01/29/2004   NUR:A 7-8 mm polyp snared from the hepatic flexure.  Another 3 mm polyp was     cold snared from the sigmoid colon.External hemorrhoids, possible source of recent rectal bleeding   COLONOSCOPY N/A 06/05/2014   Procedure: COLONOSCOPY;  Surgeon: West Bali, MD;  Location: [facility];  Service: Endoscopy;  Laterality: N/A;  1030 - moved to 10:45 - Ginger to notify pt    COLONOSCOPY WITH PROPOFOL  N/A 10/17/2017   Procedure: COLONOSCOPY WITH PROPOFOL;  Surgeon: West Bali, MD;  Location: [facility];  Service: Endoscopy;  Laterality: N/A;  10:30am   COLONOSCOPY WITH PROPOFOL N/A 12/15/2022   Procedure: COLONOSCOPY WITH PROPOFOL;  Surgeon: Lanelle Bal, DO;  [facility]: [facility];  Service: Endoscopy;  Laterality: N/A;  7;30 am   DILATION AND CURETTAGE OF UTERUS     LUMBAR SPINE SURGERY     MASTECTOMY Right    MASTECTOMY W/ SENTINEL NODE BIOPSY Right 07/12/2013   Procedure: MASTECTOMY WITH SENTINEL LYMPH NODE BIOPSY;  Surgeon: Emelia Loron, MD;  Location: [facility] OR;  Service: General;  Laterality: Right;   MASTOPEXY Left 01/14/2014   Procedure: LEFT BREAST MASTOPEXY FOR SYMMETRY ;  Surgeon: Glenna Fellows, MD;  Location: [facility];  Service: Plastics;  Laterality: Left;   POLYPECTOMY  12/15/2022   Procedure: POLYPECTOMY;  Surgeon: Lanelle Bal, DO;  [facility];  Service: Endoscopy;;   PORT-A-CATH REMOVAL Left 01/14/2014   Procedure: REMOVAL PORT-A-CATH;  Surgeon: Glenna Fellows, MD;  Location: [facility];  Service: Plastics;  Laterality: Left;   PORTACATH PLACEMENT N/A 09/16/2013   Procedure: INSERTION PORT-A-CATH;  Surgeon: Emelia Loron, MD;  Location: [facility] OR;  Service: General;  Laterality: N/A;   REDUCTION MAMMAPLASTY Left    REMOVAL OF BILATERAL TISSUE EXPANDERS WITH PLACEMENT OF BILATERAL BREAST IMPLANTS Right 01/14/2014   Procedure: REMOVAL OF RIGHT TISSUE EXPANDERS WITH PLACEMENT OF PERMANENT BREAST IMPLANT;  Surgeon: Glenna Fellows, MD;  Location: [facility];  Service: Plastics;  Laterality: Right;   TUBAL LIGATION     VAGINAL HYSTERECTOMY      Allergies  Allergies  Allergen Reactions   Codeine Nausea And Vomiting   Crestor [Rosuvastatin Calcium]     Muscle pain   Lipitor [Atorvastatin]     Muscle pain   Namenda [Memantine Hcl]     Nightmares    Peanut-Containing Drug Products      Peanut butter triggers asthma    Multihance [Gadobenate] Nausea Only and Cough    PT HAD INCREASED MUCOUS PRODUCTION AND PHLEGMY COUGH LASTING 10 MINS AFTER INJECTION, PT SENT HOME WITH BENADRYL, PREV NAUSEA AFTER GAD    History of Present Illness    Morgan Santos is a 72 y.o. female with a hx of *** last seen ***.   EKGs/Labs/Other Studies Reviewed:   The following studies were reviewed today: Cardiac Studies & Procedures     STRESS TESTS  NM MYOCAR MULTI W/SPECT W 02/10/2022  Narrative   Findings are consistent with prior myocardial infarction with peri-infarct ischemia. The study is intermediate risk.   No ST deviation was noted.   LV perfusion is abnormal. Defect 1: There is a medium defect with mild reduction in uptake present  in the apical to mid inferior and inferolateral location(s) that is fixed. There is abnormal wall motion in the defect area. Consistent with infarction. Defect 2: There is a small defect present in the apical anterior and apex location(s) that is partially reversible. There is abnormal wall motion in the defect area. Consistent with infarction and peri-infarct ischemia.   Left ventricular function is abnormal. Global function is moderately reduced. Nuclear stress EF: 39 %. The left ventricular ejection fraction is moderately decreased (30-44%). End diastolic cavity size is moderately enlarged. End systolic cavity size is moderately enlarged.  Fixed perfusion defect in inferior/inferolateral walls with hypokinesis consistent with prior infarct Partially reversible defect at apex/apical anterior wall with hypokinesis consistent with infarct with peri-infarct ischemia.  Area of ischemia is small. Moderate systolic dysfunction (EF 39%) Intermediate risk study   ECHOCARDIOGRAM  ECHOCARDIOGRAM COMPLETE 03/19/2020  Narrative ECHOCARDIOGRAM REPORT    Patient Name:   LYRICC BORRA Date of Exam: 03/19/2020 Medical Rec #:  [id number]     Height:       69.5  in Accession #:    [id number]    Weight:       266.4 lb Date of Birth:  May 19, 1951     BSA:          2.345 m Patient Age:    69 years      BP:           140/79 mmHg Patient Gender: F             HR:           65 bpm. Exam Location:  [place]  Procedure: 2D Echo, Cardiac Doppler and Color Doppler  Indications:     R06.02 SOB  History:         Patient has prior history of Echocardiogram examinations, most recent 08/16/2018. Stroke and Hx Breast cancer 2014, Arrythmias:Palpitations, Signs/Symptoms:Shortness of Breath; Risk Factors:Hypertension, Dyslipidemia, Former Smoker and Morbidly obese.  Sonographer:     Jake Seats RDMS, RVT, RDCS Referring Phys:  [id number] Netta Neat. Diagnosing Phys: Dina Rich MD  IMPRESSIONS   1. Left ventricular ejection fraction, by estimation, is 50 to 55%. The left ventricle has low normal function. The left ventricle has no regional wall motion abnormalities. Left ventricular diastolic parameters are consistent with Grade I diastolic dysfunction (impaired relaxation). 2. Right ventricular systolic function is normal. The right ventricular size is normal. There is mildly elevated pulmonary artery systolic pressure. 3. The mitral valve is normal in structure. Trivial mitral valve regurgitation. No evidence of mitral stenosis. 4. The aortic valve is tricuspid. Aortic valve regurgitation is not visualized. No aortic stenosis is present. 5. The inferior vena cava is normal in size with greater than 50% respiratory variability, suggesting right atrial pressure of 3 mmHg.  Comparison(s): Echocardiogram done 08/16/18 showed an EF of 50-55%.  FINDINGS Left Ventricle: Left ventricular ejection fraction, by estimation, is 50 to 55%. The left ventricle has low normal function. The left ventricle has no regional wall motion abnormalities. The left ventricular internal cavity size was normal in size. There is no left ventricular hypertrophy. Left ventricular  diastolic parameters are consistent with Grade I diastolic dysfunction (impaired relaxation). Normal left ventricular filling pressure.  Right Ventricle: The right ventricular size is normal. No increase in right ventricular wall thickness. Right ventricular systolic function is normal. There is mildly elevated pulmonary artery systolic pressure. The tricuspid regurgitant velocity is 2.73 m/s, and with an assumed right atrial pressure of  10 mmHg, the estimated right ventricular systolic pressure is 39.8 mmHg.  Left Atrium: Left atrial size was normal in size.  Right Atrium: Right atrial size was normal in size.  Pericardium: There is no evidence of pericardial effusion.  Mitral Valve: The mitral valve is normal in structure. Trivial mitral valve regurgitation. No evidence of mitral valve stenosis.  Tricuspid Valve: The tricuspid valve is normal in structure. Tricuspid valve regurgitation is mild . No evidence of tricuspid stenosis.  Aortic Valve: The aortic valve is tricuspid. Aortic valve regurgitation is not visualized. No aortic stenosis is present. Aortic valve mean gradient measures 4.6 mmHg. Aortic valve peak gradient measures 8.8 mmHg. Aortic valve area, by VTI measures 2.08 cm.  Pulmonic Valve: The pulmonic valve was not well visualized. Pulmonic valve regurgitation is not visualized. No evidence of pulmonic stenosis.  Aorta: The aortic root is normal in size and structure.  Pulmonary Artery: No significant pulmonary HTN, PASP is 30 mmHg.  Venous: The inferior vena cava is normal in size with greater than 50% respiratory variability, suggesting right atrial pressure of 3 mmHg.  IAS/Shunts: No atrial level shunt detected by color flow Doppler.   LEFT VENTRICLE PLAX 2D LVIDd:         5.34 cm      Diastology LVIDs:         3.59 cm      LV e' lateral:   10.60 cm/s LV PW:         0.70 cm      LV E/e' lateral: 7.0 LV IVS:        0.58 cm      LV e' medial:    6.09 cm/s LVOT diam:      2.00 cm      LV E/e' medial:  12.3 LV SV:         65 LV SV Index:   28 LVOT Area:     3.14 cm  LV Volumes (MOD) LV vol d, MOD A2C: 119.0 ml LV vol d, MOD A4C: 121.0 ml LV vol s, MOD A2C: 55.5 ml LV vol s, MOD A4C: 57.7 ml LV SV MOD A2C:     63.5 ml LV SV MOD A4C:     121.0 ml LV SV MOD BP:      62.2 ml  RIGHT VENTRICLE RV S prime:     11.40 cm/s TAPSE (M-mode): 2.2 cm  LEFT ATRIUM             Index LA diam:        3.00 cm 1.28 cm/m LA Vol (A2C):   74.4 ml 31.72 ml/m LA Vol (A4C):   65.0 ml 27.72 ml/m LA Biplane Vol:         29.70 ml/m AORTIC VALVE AV Area (Vmax):    1.89 cm AV Area (Vmean):   2.00 cm AV Area (VTI):     2.08 cm AV Vmax:           148.69 cm/s AV Vmean:          100.127 cm/s AV VTI:            0.314 m AV Peak Grad:      8.8 mmHg AV Mean Grad:      4.6 mmHg LVOT Vmax:         89.50 cm/s LVOT Vmean:        63.600 cm/s LVOT VTI:          0.208 m LVOT/AV VTI ratio:  0.66  AORTA Ao Root diam: 3.40 cm  MITRAL VALVE               TRICUSPID VALVE MV Area (PHT):             TR Peak grad:   29.8 mmHg MV Decel Time: 250 msec    TR Vmax:        273.00 cm/s MR Peak grad: 71.6 mmHg MR Vmax:      423.00 cm/s  SHUNTS MV E velocity: 74.70 cm/s  Systemic VTI:  0.21 m MV A velocity: 96.00 cm/s  Systemic Diam: 2.00 cm MV E/A ratio:  0.78  Dina Rich MD Electronically signed by Dina Rich MD Signature Date/Time: 03/19/2020/12:34:10 PM    Final    MONITORS  LONG TERM MONITOR (3-14 DAYS) 09/29/2022  Narrative   Predeominant rhythm was sinus bradycardia with average heart rate 57bpm and ranged from 54 to 85bpm.   Wide complex tachycardia  with longest episode lasting 11 beats and fastest episode 162bpm.   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 Predominant underlying rhythm was Sinus Rhythm. First Degree AV Block was present. Bundle Branch Block/IVCD was present. 5 Ventricular Tachycardia runs occurred, the run with the fastest interval lasting 5 beats with a max rate of 162 bpm, the longest lasting 11 beats with an avg rate of 118 bpm. Junctional Rhythm was present. 2 episode(s) of AV Block (3rd) occurred, lasting a total of 11 secs. Second Degree AV Block-Mobitz I (Wenckebach) was present. Wenckebach was detected within +/- 45 seconds of symptomatic patient event(s). Isolated SVEs were occasional (2.9%, 31522), and no SVE Couplets or SVE Triplets were present. Isolated VEs were occasional (2.2%, 24070), VE Couplets were rare (<1.0%, 1186), and VE Triplets were rare (<1.0%, 1). Ventricular Bigeminy was present. Inverted QRS complexes possibly due to inverted placement of device. MD notification criteria for Complete Heart Block met - report posted prior to notification per account request (SV).   CT SCANS  CT CORONARY MORPH W/CTA COR W/SCORE 03/10/2022  Addendum 03/10/2022  9:11 PM ADDENDUM REPORT: 03/10/2022 21:09  CLINICAL DATA:  Chest pain  EXAM: Cardiac/Coronary CTA  TECHNIQUE: A non-contrast, gated CT scan was obtained with axial slices of 3 mm through the heart for calcium scoring. Calcium scoring was performed using the Agatston method. A 120 kV prospective, gated, contrast cardiac scan was obtained. Gantry rotation speed was 250 msecs and collimation was 0.6 mm. Two sublingual nitroglycerin tablets (0.8 mg) were given. The 3D data set was reconstructed in 5% intervals of the 35-75% of the R-R cycle. Diastolic phases were analyzed on a dedicated workstation using MPR, MIP, and VRT modes. The patient received 95 cc of contrast.  FINDINGS: Image quality: Excellent.  Noise artifact is: Limited.  Coronary Arteries:   Normal coronary origin.  Right dominance.  Left main: The left main is a large caliber vessel with a normal take off from the left coronary cusp that bifurcates to form a left anterior descending artery and a left circumflex artery. There is no plaque or stenosis.  Left anterior descending artery: The LAD is patent without evidence of plaque or stenosis. The LAD gives off 2  patent diagonal branches.  Left circumflex artery: The LCX is non-dominant and gives off 2 patent obtuse marginal branches. There is mild mixed plaque in the proximal LCx with associated stenosis of 25-49%.  Right coronary artery: The RCA is dominant with normal take off from the right coronary cusp. There is no evidence of plaque or stenosis. The RCA terminates as a PDA and right posterolateral branch without evidence of plaque or stenosis.  Right Atrium: Right atrial size is within normal limits.  Right Ventricle: The right ventricular cavity is within normal limits.  Left Atrium: Left atrial size is normal in size with no left atrial appendage filling defect.  Left Ventricle: The ventricular cavity size is within normal limits. There are no stigmata of prior infarction. There is no abnormal filling defect.  Pulmonary arteries: Normal in size without proximal filling defect.  Pulmonary veins: Normal pulmonary venous drainage.  Pericardium: Normal thickness with no significant effusion or calcium present.  Cardiac valves: The aortic valve is trileaflet without significant calcification. The mitral valve is normal structure without significant calcification.  Aorta: Normal caliber with mild atherosclerosis.  Extra-cardiac findings: See attached radiology report for non-cardiac structures.  IMPRESSION: 1. Coronary calcium score of 0. This was 0 percentile for age-, sex, and race-matched controls.  2.  Normal coronary origin with right dominance.  3.  Mild atherosclerosis.  CAD RADS 2.  4.   Consider non atherosclerotic causes of chest pain.  5.  Recommend preventive therapy and risk factor modification.  RECOMMENDATIONS: 1. CAD-RADS 0: No evidence of CAD (0%). Consider non-atherosclerotic causes of chest pain.  2. CAD-RADS 1: Minimal non-obstructive CAD (0-24%). Consider non-atherosclerotic causes of chest pain. Consider preventive therapy and risk factor modification.  3. CAD-RADS 2: Mild non-obstructive CAD (25-49%). Consider non-atherosclerotic causes of chest pain. Consider preventive therapy and risk factor modification.  4. CAD-RADS 3: Moderate stenosis. Consider symptom-guided anti-ischemic pharmacotherapy as well as risk factor modification per guideline directed care. Additional analysis with CT FFR will be submitted.  5. CAD-RADS 4: Severe stenosis. (70-99% or > 50% left main). Cardiac catheterization or CT FFR is recommended. Consider symptom-guided anti-ischemic pharmacotherapy as well as risk factor modification per guideline directed care. Invasive coronary angiography recommended with revascularization per published guideline statements.  6. CAD-RADS 5: Total coronary occlusion (100%). Consider cardiac catheterization or viability assessment. Consider symptom-guided anti-ischemic pharmacotherapy as well as risk factor modification per guideline directed care.  7. CAD-RADS N: Non-diagnostic study. Obstructive CAD can't be excluded. Alternative evaluation is recommended.  Armanda Magic, MD   Electronically Signed By: Armanda Magic M.D. On: 03/10/2022 21:09  Narrative EXAM: OVER-READ INTERPRETATION  CT CHEST  The following report is a limited chest CT over-read performed by radiologist Dr. Charlett Nose of [facility] on 03/10/2022. This over-read does not include interpretation of cardiac or coronary anatomy or pathology. The coronary CTA interpretation by the cardiologist is attached.  COMPARISON:  None  Available.  FINDINGS: Vascular: Heart is normal size.  Aorta normal caliber.  Mediastinum/Nodes: No adenopathy  Lungs/Pleura: No confluent opacities or effusions.  Upper Abdomen: No acute findings  Musculoskeletal: Right breast implant partially imaged. No acute bony abnormality.  IMPRESSION: No acute or significant extracardiac abnormality.  Electronically Signed: By: Charlett Nose M.D. On: 03/10/2022 17:15           EKG:  EKG is *** ordered today.  The ekg ordered today demonstrates ***  Recent Labs: 08/30/2022: ALT 19; BUN 18; Creatinine, Ser 0.79; Potassium 4.6; Sodium  142; TSH 2.130 12/13/2022: Hemoglobin 14.5; Platelets 206  Recent Lipid Panel    Component Value Date/Time   CHOL 218 (H) 08/30/2022 0952   TRIG 191 (H) 08/30/2022 0952   HDL 47 08/30/2022 0952   CHOLHDL 4.6 (H) 08/30/2022 0952   CHOLHDL 4.3 03/15/2022 0918   VLDL 29 03/15/2022 0918   LDLCALC 137 (H) 08/30/2022 0952   LDLDIRECT 101 (H) 04/09/2021 1555    Risk Assessment/Calculations:  {Does this patient have ATRIAL FIBRILLATION?[phone]}  Home Medications   No outpatient medications have been marked as taking for the 01/20/23 encounter (Appointment) with Sharlene Dory, PA-C.     Review of Systems   ***   All other systems reviewed and are otherwise negative except as noted above.  Physical Exam    VS:  There were no vitals taken for this visit. , BMI There is no height or weight on file to calculate BMI.  Wt Readings from Last 3 Encounters:  12/15/22 252 lb (114.3 kg)  12/13/22 252 lb (114.3 kg)  10/28/22 253 lb 8.5 oz (115 kg)     GEN: Well nourished, well developed, in no acute distress. HEENT: normal. Neck: Supple, no JVD, carotid bruits, or masses. Cardiac: ***RRR, no murmurs, rubs, or gallops. No clubbing, cyanosis, edema.  ***Radials/PT 2+ and equal bilaterally.  Respiratory:  ***Respirations regular and unlabored, clear to auscultation bilaterally. GI: Soft, nontender,  nondistended. MS: No deformity or atrophy. Skin: Warm and dry, no rash. Neuro:  Strength and sensation are intact. Psych: Normal affect.  Assessment & Plan    Witnessed cardiac arrest Acute systolic heart failure Hypomagnesium   No BP recorded.  {Refresh Note OR Click here to enter BP  :1}***      Disposition: Follow up {follow up:15908} with Armanda Magic, MD or APP.  Signed, Sharlene Dory, PA-C 01/20/2023, 10:43 AM [facility]

## 2023-01-24 DIAGNOSIS — Z789 Other specified health status: Secondary | ICD-10-CM | POA: Diagnosis not present

## 2023-01-24 DIAGNOSIS — M159 Polyosteoarthritis, unspecified: Secondary | ICD-10-CM | POA: Diagnosis not present

## 2023-01-24 DIAGNOSIS — Z853 Personal history of malignant neoplasm of breast: Secondary | ICD-10-CM | POA: Diagnosis not present

## 2023-01-24 DIAGNOSIS — Z599 Problem related to housing and economic circumstances, unspecified: Secondary | ICD-10-CM | POA: Diagnosis not present

## 2023-01-24 DIAGNOSIS — I679 Cerebrovascular disease, unspecified: Secondary | ICD-10-CM | POA: Diagnosis not present

## 2023-01-24 DIAGNOSIS — R413 Other amnesia: Secondary | ICD-10-CM | POA: Diagnosis not present

## 2023-01-24 DIAGNOSIS — Z09 Encounter for follow-up examination after completed treatment for conditions other than malignant neoplasm: Secondary | ICD-10-CM | POA: Diagnosis not present

## 2023-01-24 DIAGNOSIS — M25561 Pain in right knee: Secondary | ICD-10-CM | POA: Diagnosis not present

## 2023-01-26 ENCOUNTER — Ambulatory Visit
Admission: RE | Admit: 2023-01-26 | Discharge: 2023-01-26 | Disposition: A | Discharge status: Home / Self Care | Payer: Medicare PPO | Source: Ambulatory Visit | Attending: Family Medicine | Admitting: Family Medicine

## 2023-01-26 DIAGNOSIS — Z1231 Encounter for screening mammogram for malignant neoplasm of breast: Secondary | ICD-10-CM | POA: Diagnosis not present

## 2023-02-06 DIAGNOSIS — I469 Cardiac arrest, cause unspecified: Secondary | ICD-10-CM | POA: Diagnosis not present

## 2023-02-06 DIAGNOSIS — I5022 Chronic systolic (congestive) heart failure: Secondary | ICD-10-CM | POA: Diagnosis not present

## 2023-02-06 DIAGNOSIS — Z789 Other specified health status: Secondary | ICD-10-CM | POA: Diagnosis not present

## 2023-02-06 DIAGNOSIS — Z7409 Other reduced mobility: Secondary | ICD-10-CM | POA: Diagnosis not present

## 2023-02-08 DIAGNOSIS — I493 Ventricular premature depolarization: Secondary | ICD-10-CM | POA: Diagnosis not present

## 2023-02-08 DIAGNOSIS — I509 Heart failure, unspecified: Secondary | ICD-10-CM | POA: Diagnosis not present

## 2023-02-08 DIAGNOSIS — Z950 Presence of cardiac pacemaker: Secondary | ICD-10-CM | POA: Diagnosis not present

## 2024-03-06 ENCOUNTER — Other Ambulatory Visit: Payer: Self-pay | Admitting: Family Medicine

## 2024-03-06 DIAGNOSIS — Z1231 Encounter for screening mammogram for malignant neoplasm of breast: Secondary | ICD-10-CM

## 2024-03-20 ENCOUNTER — Ambulatory Visit
Admission: RE | Admit: 2024-03-20 | Discharge: 2024-03-20 | Disposition: A | Discharge status: Home / Self Care | Source: Ambulatory Visit | Attending: Family Medicine

## 2024-03-20 DIAGNOSIS — Z1231 Encounter for screening mammogram for malignant neoplasm of breast: Secondary | ICD-10-CM

## 2024-05-01 ENCOUNTER — Encounter: Payer: Self-pay | Admitting: Emergency Medicine
